# Patient Record
Sex: Female | Born: 1945 | Hispanic: No | Smoking: Never smoker
Health system: Southern US, Community
[De-identification: ages and names within clinical notes are randomized; demographics above are authoritative.]

## PROBLEM LIST (undated history)

## (undated) DIAGNOSIS — K5792 Diverticulitis of intestine, part unspecified, without perforation or abscess without bleeding: Secondary | ICD-10-CM

## (undated) DIAGNOSIS — E119 Type 2 diabetes mellitus without complications: Secondary | ICD-10-CM

## (undated) DIAGNOSIS — K219 Gastro-esophageal reflux disease without esophagitis: Secondary | ICD-10-CM

## (undated) DIAGNOSIS — I10 Essential (primary) hypertension: Secondary | ICD-10-CM

## (undated) DIAGNOSIS — E785 Hyperlipidemia, unspecified: Secondary | ICD-10-CM

## (undated) DIAGNOSIS — E079 Disorder of thyroid, unspecified: Secondary | ICD-10-CM

## (undated) DIAGNOSIS — J45909 Unspecified asthma, uncomplicated: Secondary | ICD-10-CM

## (undated) DIAGNOSIS — E7800 Pure hypercholesterolemia, unspecified: Secondary | ICD-10-CM

## (undated) DIAGNOSIS — F419 Anxiety disorder, unspecified: Secondary | ICD-10-CM

## (undated) DIAGNOSIS — E669 Obesity, unspecified: Secondary | ICD-10-CM

## (undated) DIAGNOSIS — L719 Rosacea, unspecified: Secondary | ICD-10-CM

## (undated) DIAGNOSIS — K5190 Ulcerative colitis, unspecified, without complications: Secondary | ICD-10-CM

## (undated) HISTORY — PX: ACHILLES TENDON REPAIR: SUR1153

## (undated) HISTORY — DX: Anxiety disorder, unspecified: F41.9

## (undated) HISTORY — PX: CHOLECYSTECTOMY: SHX55

## (undated) HISTORY — PX: APPENDECTOMY: SHX54

## (undated) HISTORY — DX: Diverticulitis of intestine, part unspecified, without perforation or abscess without bleeding: K57.92

## (undated) HISTORY — DX: Pure hypercholesterolemia, unspecified: E78.00

## (undated) HISTORY — DX: Obesity, unspecified: E66.9

## (undated) HISTORY — DX: Type 2 diabetes mellitus without complications: E11.9

## (undated) HISTORY — DX: Rosacea, unspecified: L71.9

## (undated) HISTORY — DX: Ulcerative colitis, unspecified, without complications: K51.90

## (undated) HISTORY — PX: OTHER SURGICAL HISTORY: SHX169

## (undated) HISTORY — DX: Essential (primary) hypertension: I10

---

## 1997-10-14 ENCOUNTER — Other Ambulatory Visit: Admission: RE | Admit: 1997-10-14 | Discharge: 1997-10-14 | Payer: Self-pay | Admitting: Family Medicine

## 1999-07-17 ENCOUNTER — Encounter: Admission: RE | Admit: 1999-07-17 | Discharge: 1999-10-15 | Payer: Self-pay | Admitting: *Deleted

## 2003-12-01 ENCOUNTER — Other Ambulatory Visit: Admission: RE | Admit: 2003-12-01 | Discharge: 2003-12-01 | Payer: Self-pay | Admitting: Family Medicine

## 2004-01-10 ENCOUNTER — Ambulatory Visit (HOSPITAL_COMMUNITY): Admission: RE | Admit: 2004-01-10 | Discharge: 2004-01-10 | Payer: Self-pay | Admitting: Gastroenterology

## 2005-01-26 ENCOUNTER — Other Ambulatory Visit: Admission: RE | Admit: 2005-01-26 | Discharge: 2005-01-26 | Payer: Self-pay | Admitting: Family Medicine

## 2005-04-21 ENCOUNTER — Emergency Department (HOSPITAL_COMMUNITY): Admission: EM | Admit: 2005-04-21 | Discharge: 2005-04-21 | Payer: Self-pay | Admitting: Emergency Medicine

## 2006-02-11 ENCOUNTER — Other Ambulatory Visit: Admission: RE | Admit: 2006-02-11 | Discharge: 2006-02-11 | Payer: Self-pay | Admitting: Family Medicine

## 2007-02-21 ENCOUNTER — Other Ambulatory Visit: Admission: RE | Admit: 2007-02-21 | Discharge: 2007-02-21 | Payer: Self-pay | Admitting: Family Medicine

## 2008-05-03 ENCOUNTER — Other Ambulatory Visit: Admission: RE | Admit: 2008-05-03 | Discharge: 2008-05-03 | Payer: Self-pay | Admitting: Family Medicine

## 2009-08-15 ENCOUNTER — Other Ambulatory Visit: Admission: RE | Admit: 2009-08-15 | Discharge: 2009-08-15 | Payer: Self-pay | Admitting: Family Medicine

## 2010-02-11 ENCOUNTER — Emergency Department (HOSPITAL_COMMUNITY): Admission: EM | Admit: 2010-02-11 | Discharge: 2010-02-12 | Payer: Self-pay | Admitting: Emergency Medicine

## 2010-04-12 ENCOUNTER — Encounter: Admission: RE | Admit: 2010-04-12 | Discharge: 2010-04-12 | Payer: Self-pay | Admitting: Family Medicine

## 2010-10-14 LAB — URINALYSIS, ROUTINE W REFLEX MICROSCOPIC
Bilirubin Urine: NEGATIVE
Glucose, UA: NEGATIVE mg/dL
Nitrite: NEGATIVE
Specific Gravity, Urine: 1.005 (ref 1.005–1.030)
pH: 7 (ref 5.0–8.0)

## 2010-10-14 LAB — CBC
HCT: 35.4 % — ABNORMAL LOW (ref 36.0–46.0)
MCH: 33 pg (ref 26.0–34.0)
MCV: 94.4 fL (ref 78.0–100.0)
RBC: 3.75 MIL/uL — ABNORMAL LOW (ref 3.87–5.11)
WBC: 9.5 10*3/uL (ref 4.0–10.5)

## 2010-10-14 LAB — URINE CULTURE
Colony Count: NO GROWTH
Culture: NO GROWTH

## 2010-10-14 LAB — DIFFERENTIAL
Basophils Absolute: 0 10*3/uL (ref 0.0–0.1)
Lymphocytes Relative: 7 % — ABNORMAL LOW (ref 12–46)
Monocytes Absolute: 0.7 10*3/uL (ref 0.1–1.0)
Neutro Abs: 7.9 10*3/uL — ABNORMAL HIGH (ref 1.7–7.7)

## 2010-10-14 LAB — URINE MICROSCOPIC-ADD ON

## 2010-10-14 LAB — GLUCOSE, CAPILLARY
Glucose-Capillary: 112 mg/dL — ABNORMAL HIGH (ref 70–99)
Glucose-Capillary: 159 mg/dL — ABNORMAL HIGH (ref 70–99)

## 2010-10-14 LAB — BASIC METABOLIC PANEL
BUN: 10 mg/dL (ref 6–23)
Chloride: 94 mEq/L — ABNORMAL LOW (ref 96–112)
GFR calc Af Amer: >60 mL/min (ref >60)
GFR calc non Af Amer: >60 mL/min (ref >60)

## 2010-10-19 ENCOUNTER — Other Ambulatory Visit (HOSPITAL_COMMUNITY)
Admission: RE | Admit: 2010-10-19 | Discharge: 2010-10-19 | Disposition: A | Discharge status: Home / Self Care | Payer: Medicare Other | Source: Ambulatory Visit | Attending: Family Medicine | Admitting: Family Medicine

## 2010-10-19 ENCOUNTER — Other Ambulatory Visit: Payer: Self-pay | Admitting: Family Medicine

## 2010-10-19 DIAGNOSIS — Z124 Encounter for screening for malignant neoplasm of cervix: Secondary | ICD-10-CM | POA: Insufficient documentation

## 2010-12-15 NOTE — Op Note (Signed)
NAME:  Kristina [name], Kristina [name]                           ACCOUNT NO.:  [id number]   MEDICAL RECORD NO.:  [id number]                   PATIENT TYPE:  AMB   LOCATION:  ENDO                                 FACILITY:  [facility]   PHYSICIAN:  John C. Madilyn Fireman, M.D.                 DATE OF BIRTH:  Jun 27, 1946   DATE OF PROCEDURE:  01/10/2004  DATE OF DISCHARGE:                                 OPERATIVE REPORT   PROCEDURE:  Colonoscopy.   INDICATION FOR PROCEDURE:  Colon cancer screening in a 65 year old patient  with a recent bout of documented diverticulitis.   PROCEDURE:  The patient was placed in the left lateral decubitus position  and placed on the pulse monitor with continuous low-flow oxygen delivered by  nasal cannula.  She was sedated with 50 mcg IV fentanyl and 6 mg IV Versed.  The Olympus video colonoscope was inserted into the rectum and advanced to  the cecum, confirmed by transillumination of McBurney's point and  visualization of the ileocecal valve and appendiceal orifice.  The prep was  excellent.  The cecum appeared normal with no masses, polyps, diverticula,  or other mucosal abnormalities.  Within the ascending, transverse,  descending, and sigmoid colon there were seen numerous diverticula, some  quite large, but no evidence of diverticulitis.  There were no masses,  polyps, or other abnormalities seen.  The rectum appeared normal, and  retroflexed view of the anus revealed no obvious internal hemorrhoids.  The  scope was then withdrawn and the patient returned to the recovery room in  stable condition.  She tolerated the procedure well, and there were no  immediate complications.   IMPRESSION:  Diverticulosis, otherwise normal study.   PLAN:  Next colon screening by sigmoidoscopy in five years.                                               John C. Madilyn Fireman, M.D.    JCH/MEDQ  D:  01/10/2004  T:  01/10/2004  Job:  [id number]   cc:   Duncan Dull, M.D.  [address]  [place]  [place] [address]  Fax: [phone]

## 2013-08-26 ENCOUNTER — Encounter: Payer: Self-pay | Admitting: Cardiovascular Disease

## 2013-08-26 ENCOUNTER — Encounter: Payer: Self-pay | Admitting: *Deleted

## 2013-08-26 ENCOUNTER — Telehealth: Payer: Self-pay | Admitting: Cardiovascular Disease

## 2013-08-26 ENCOUNTER — Ambulatory Visit (INDEPENDENT_AMBULATORY_CARE_PROVIDER_SITE_OTHER): Payer: Medicare Other | Admitting: Cardiovascular Disease

## 2013-08-26 VITALS — BP 140/90 | HR 74 | Ht 64.0 in | Wt 183.0 lb

## 2013-08-26 DIAGNOSIS — E119 Type 2 diabetes mellitus without complications: Secondary | ICD-10-CM | POA: Insufficient documentation

## 2013-08-26 DIAGNOSIS — I10 Essential (primary) hypertension: Secondary | ICD-10-CM

## 2013-08-26 DIAGNOSIS — F419 Anxiety disorder, unspecified: Secondary | ICD-10-CM | POA: Insufficient documentation

## 2013-08-26 DIAGNOSIS — I493 Ventricular premature depolarization: Secondary | ICD-10-CM | POA: Insufficient documentation

## 2013-08-26 DIAGNOSIS — I4949 Other premature depolarization: Secondary | ICD-10-CM

## 2013-08-26 DIAGNOSIS — R0600 Dyspnea, unspecified: Secondary | ICD-10-CM

## 2013-08-26 DIAGNOSIS — R0609 Other forms of dyspnea: Secondary | ICD-10-CM

## 2013-08-26 DIAGNOSIS — L719 Rosacea, unspecified: Secondary | ICD-10-CM | POA: Insufficient documentation

## 2013-08-26 DIAGNOSIS — R9431 Abnormal electrocardiogram [ECG] [EKG]: Secondary | ICD-10-CM

## 2013-08-26 DIAGNOSIS — R0989 Other specified symptoms and signs involving the circulatory and respiratory systems: Secondary | ICD-10-CM

## 2013-08-26 DIAGNOSIS — E669 Obesity, unspecified: Secondary | ICD-10-CM | POA: Insufficient documentation

## 2013-08-26 DIAGNOSIS — K5792 Diverticulitis of intestine, part unspecified, without perforation or abscess without bleeding: Secondary | ICD-10-CM | POA: Insufficient documentation

## 2013-08-26 NOTE — Progress Notes (Signed)
Patient ID: Kristina [name], female   DOB: 08/09/1945, 68 y.o.   MRN: [id number]  68 yo referred by primary for PVC and abnormal ECG.  She had routine exam on 1/19.  CRF;s HTN elevated lipids and DM.  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  She has some exertional dyspnea  Very infrequent palpitations at night.  Activity limited by knee pain.  Has lost about 10 lbs recently with better diet. No chest pain.  Reviewed ECG from 1/19 and had period of bigeminny with low lead placement mimicking anterior MI  ECG in our office today totally normal Denies ETOH, syncope excess caffeine or stimulants Compliant with meds  ROS: Denies fever, malais, weight loss, blurry vision, decreased visual acuity, cough, sputum, SOB, hemoptysis, pleuritic pain, palpitaitons, heartburn, abdominal pain, melena, lower extremity edema, claudication, or rash.  All other systems reviewed and negative   General: Affect appropriate Healthy:  appears stated age HEENT: normal Neck supple with no adenopathy JVP normal no bruits no thyromegaly Lungs clear with no wheezing and good diaphragmatic motion Heart:  S1/S2 no murmur,rub, gallop or click PMI normal Abdomen: benighn, BS positve, no tenderness, no AAA no bruit.  No HSM or HJR Distal pulses intact with no bruits No edema Neuro non-focal Skin warm and dry No muscular weakness  Medications Current Outpatient Prescriptions  Medication Sig Dispense Refill  . amLODipine (NORVASC) 2.5 MG tablet Take 2.5 mg by mouth daily.      Marland Kitchen. aspirin 81 MG tablet Take 81 mg by mouth daily.      . Calcium-Magnesium-Vitamin D (CALCIUM 500 PO) Take 1 tablet by mouth daily.      . hydrochlorothiazide (HYDRODIURIL) 25 MG tablet Take 25 mg by mouth daily.      . insulin glargine (LANTUS) 100 UNIT/ML injection Inject 18-20 Units into the skin at bedtime.      Marland Kitchen. lisinopril (PRINIVIL,ZESTRIL) 40 MG tablet Take 1 tablet by mouth  daily.      . metFORMIN (GLUCOPHAGE) 500 MG tablet 4 tabs po qd      . simvastatin (ZOCOR) 20 MG tablet Take 1 tablet by mouth daily.       No current facility-administered medications for this visit.    Allergies Ciprofloxacin and Doxycycline  Family History: No family history on file.  Social History: History   Social History  . Marital Status: Unknown    Spouse Name: N/A    Number of Children: N/A  . Years of Education: N/A   Occupational History  . Not on file.   Social History Main Topics  . Smoking status: Never Smoker   . Smokeless tobacco: Not on file  . Alcohol Use: Not on file  . Drug Use: Not on file  . Sexual Activity: Not on file   Other Topics Concern  . Not on file   Social History Narrative  . No narrative on file    Electrocardiogram:  1/19  SR rate 82 period of bigeminy poor R wave progression lead placement Today NSR rate 74 normal   Assessment and Plan

## 2013-08-26 NOTE — Assessment & Plan Note (Signed)
Well controlled.  Continue current medications and low sodium Dash type diet.    

## 2013-08-26 NOTE — Assessment & Plan Note (Signed)
Asymptomatic   Echo to assess RV and LV function ETT to r/o exercise induced worsening and r/o CAD

## 2013-08-26 NOTE — Telephone Encounter (Signed)
Walk In pt Form " New Pt Packet" dropped off gave to [employer]ine

## 2013-08-26 NOTE — Patient Instructions (Signed)
Your physician recommends that you schedule a follow-up appointment in:  AS NEEDED  Your physician recommends that you continue on your current medications as directed. Please refer to the Current Medication list given to you today. Your physician has requested that you have an echocardiogram. Echocardiography is a painless test that uses sound waves to create images of your heart. It provides your doctor with information about the size and shape of your heart and how well your heart's chambers and valves are working. This procedure takes approximately one hour. There are no restrictions for this procedure.   Your physician has requested that you have an exercise tolerance test. For further information please visit [email or web address]. Please also follow instruction sheet, as given.  

## 2013-08-26 NOTE — Assessment & Plan Note (Signed)
Discussed low carb diet.  Target hemoglobin A1c is 6.5 or less.  Continue current medications.  

## 2013-09-14 ENCOUNTER — Encounter: Payer: Self-pay | Admitting: Cardiovascular Disease

## 2013-09-30 ENCOUNTER — Ambulatory Visit (HOSPITAL_COMMUNITY): Payer: Medicare Other | Attending: Cardiovascular Disease | Admitting: Radiology

## 2013-09-30 ENCOUNTER — Telehealth: Payer: Self-pay | Admitting: Cardiovascular Disease

## 2013-09-30 ENCOUNTER — Ambulatory Visit (INDEPENDENT_AMBULATORY_CARE_PROVIDER_SITE_OTHER): Payer: Medicare Other | Admitting: Physician Assistant

## 2013-09-30 DIAGNOSIS — R0600 Dyspnea, unspecified: Secondary | ICD-10-CM

## 2013-09-30 DIAGNOSIS — R9431 Abnormal electrocardiogram [ECG] [EKG]: Secondary | ICD-10-CM

## 2013-09-30 NOTE — Progress Notes (Signed)
Exercise Treadmill Test  Pre-Exercise Testing Evaluation Rhythm: normal sinus  Rate: 91     Test  Exercise Tolerance Test Ordering MD: Charlton HawsPeter Nishan, MD  Interpreting MD: Tereso NewcomerScott Denijah Karrer, PA-C  Unique Test No: 1  Treadmill:  1  Indication for ETT: Abnormal EKG  Contraindication to ETT: No   Stress Modality: exercise - treadmill  Cardiac Imaging Performed: non   Protocol: standard Bruce - maximal  Max BP:  232/92  Max MPHR (bpm):  152 85% MPR (bpm):  129  MPHR obtained (bpm):  155 % MPHR obtained:  102  Reached 85% MPHR (min:sec):  1:15 Total Exercise Time (min-sec):  4:00  Workload in METS:  5.8 Borg Scale: 15  Reason ETT Terminated:  desired heart rate attained    ST Segment Analysis At Rest: non-specific ST segment slurring With Exercise: non-specific ST changes  Other Information Arrhythmia:  No Angina during ETT:  absent (0) Quality of ETT:  diagnostic  ETT Interpretation:  normal - no evidence of ischemia by ST analysis  Comments: Fair exercise capacity. No chest pain. Hypertensive  BP response to exercise. There was insignificant up-sloping ST depression at peak exercise.  No ST changes to suggest ischemia.  Occasional PVCs pre-test and in recovery. No exercise induced ventricular arrhythmias.  Recommendations: F/u with Dr. Charlton HawsPeter Nishan as directed. Signed,  Tereso NewcomerScott Raequan Vanschaick, PA-C   09/30/2013 10:46 AM

## 2013-09-30 NOTE — Telephone Encounter (Signed)
Walk In pt Form " EKG " [facility]

## 2013-09-30 NOTE — Progress Notes (Signed)
Echocardiogram Performed. 

## 2013-12-07 ENCOUNTER — Emergency Department (HOSPITAL_COMMUNITY): Payer: Medicare Other

## 2013-12-07 ENCOUNTER — Encounter (HOSPITAL_COMMUNITY): Payer: Self-pay | Admitting: Emergency Medicine

## 2013-12-07 ENCOUNTER — Emergency Department (HOSPITAL_COMMUNITY)
Admission: EM | Admit: 2013-12-07 | Discharge: 2013-12-07 | Disposition: A | Discharge status: Home / Self Care | Payer: Medicare Other | Attending: Emergency Medicine | Admitting: Emergency Medicine

## 2013-12-07 DIAGNOSIS — E7800 Pure hypercholesterolemia, unspecified: Secondary | ICD-10-CM | POA: Insufficient documentation

## 2013-12-07 DIAGNOSIS — E119 Type 2 diabetes mellitus without complications: Secondary | ICD-10-CM | POA: Insufficient documentation

## 2013-12-07 DIAGNOSIS — Z7982 Long term (current) use of aspirin: Secondary | ICD-10-CM | POA: Insufficient documentation

## 2013-12-07 DIAGNOSIS — I10 Essential (primary) hypertension: Secondary | ICD-10-CM | POA: Insufficient documentation

## 2013-12-07 DIAGNOSIS — Z794 Long term (current) use of insulin: Secondary | ICD-10-CM | POA: Insufficient documentation

## 2013-12-07 DIAGNOSIS — E669 Obesity, unspecified: Secondary | ICD-10-CM | POA: Insufficient documentation

## 2013-12-07 DIAGNOSIS — F411 Generalized anxiety disorder: Secondary | ICD-10-CM | POA: Insufficient documentation

## 2013-12-07 DIAGNOSIS — R55 Syncope and collapse: Secondary | ICD-10-CM

## 2013-12-07 DIAGNOSIS — Z79899 Other long term (current) drug therapy: Secondary | ICD-10-CM | POA: Insufficient documentation

## 2013-12-07 DIAGNOSIS — Z872 Personal history of diseases of the skin and subcutaneous tissue: Secondary | ICD-10-CM | POA: Insufficient documentation

## 2013-12-07 LAB — I-STAT TROPONIN, ED
TROPONIN I, POC: 0.01 ng/mL (ref 0.00–0.08)
Troponin i, poc: 0.01 ng/mL (ref 0.00–0.08)

## 2013-12-07 LAB — CBC WITH DIFFERENTIAL/PLATELET
BASOS ABS: 0 10*3/uL (ref 0.0–0.1)
Basophils Relative: 1 % (ref 0–1)
EOS ABS: 0.3 10*3/uL (ref 0.0–0.7)
EOS PCT: 4 % (ref 0–5)
HEMATOCRIT: 39.8 % (ref 36.0–46.0)
Hemoglobin: 13.7 g/dL (ref 12.0–15.0)
Lymphocytes Relative: 9 % — ABNORMAL LOW (ref 12–46)
Lymphs Abs: 0.7 10*3/uL (ref 0.7–4.0)
MCH: 31.6 pg (ref 26.0–34.0)
MCHC: 34.4 g/dL (ref 30.0–36.0)
MCV: 91.9 fL (ref 78.0–100.0)
MONOS PCT: 5 % (ref 3–12)
Monocytes Absolute: 0.4 10*3/uL (ref 0.1–1.0)
Neutro Abs: 5.9 10*3/uL (ref 1.7–7.7)
Neutrophils Relative %: 81 % — ABNORMAL HIGH (ref 43–77)
Platelets: 207 10*3/uL (ref 150–400)
RBC: 4.33 MIL/uL (ref 3.87–5.11)
RDW: 12.5 % (ref 11.5–15.5)
WBC: 7.1 10*3/uL (ref 4.0–10.5)

## 2013-12-07 LAB — BASIC METABOLIC PANEL
BUN: 16 mg/dL (ref 6–23)
CALCIUM: 9.7 mg/dL (ref 8.4–10.5)
CO2: 25 mEq/L (ref 19–32)
CREATININE: 0.82 mg/dL (ref 0.50–1.10)
Chloride: 99 mEq/L (ref 96–112)
GFR calc non Af Amer: 72 mL/min — ABNORMAL LOW (ref >90)
GFR, EST AFRICAN AMERICAN: 83 mL/min — AB (ref >90)
Glucose, Bld: 226 mg/dL — ABNORMAL HIGH (ref 70–99)
Potassium: 4.3 mEq/L (ref 3.7–5.3)
Sodium: 138 mEq/L (ref 137–147)

## 2013-12-07 MED ORDER — SODIUM CHLORIDE 0.9 % IV SOLN
Freq: Once | INTRAVENOUS | Status: AC
  Administered 2013-12-07: 12:00:00 via INTRAVENOUS

## 2013-12-07 NOTE — ED Notes (Signed)
Pt discharged home with all belongings, pt alert, oriented and ambulatory upon discharge, no new RX prescribed, pt verbalizes understanding of discharge instructions, pt driven home by friend

## 2013-12-07 NOTE — ED Provider Notes (Signed)
Medical screening examination/treatment/procedure(s) were conducted as a shared visit with non-physician practitioner(s) and myself.  I personally evaluated the patient during the encounter.  [facility] became lightheaded, sweaty, nauseated.  Felt better after sitting down.  No CP or SOB.  Diabetic, sugar was 180, ate normally today. Has done same exercise routine previously without problems.  Negative stress test March 2015. EKG unchanged. RRR, CTAB, nonfocal neuro exam.   EKG Interpretation   Date/Time:  Monday Dec 07 2013 11:01:51 EDT Ventricular Rate:  76 PR Interval:  147 QRS Duration: 79 QT Interval:  351 QTC Calculation: 395 R Axis:   68 Text Interpretation:  Sinus rhythm Ventricular premature complex No  previous ECGs available Confirmed by Anber Mckiver  MD, Elleigh Cassetta ([id number]) on  12/07/2013 11:23:55 AM      BP 107/62  Pulse 70  Temp(Src) 98.6 F (37 C) (Oral)  Resp 18  SpO2 99%    Glynn OctaveStephen Juwuan Sedita, MD 12/07/13 2021

## 2013-12-07 NOTE — ED Notes (Signed)
[facility] when she became sweating more than normal in this class and felt like she was going to pass out, nausea and fingers tingling which both of these have subsided. Pt assisted to floor and 911 called. Pt never loss consciousness or hit her head. 12 lead unremarkable, pt denies chest pain or discomfort, pt reported "feeling herself" en route to the ED. Orthostatic VS negative on scene, lying 150/90, sitting 190/100. VSS BP 137/52, HR 80, RR 16, 99% RA, CBG 182 20 G LH SL

## 2013-12-07 NOTE — ED Notes (Signed)
[facility], pt states while doing her routine exercise class this am she became hot and more sweaty than normal and felt like she was going to pass out. Pt reports associated symptoms of nausea and tingling to her finger tips that have both subsided. Pt also reports having "soft stool" x2 days, which pt states is also normal for her due to taking Metformin, pt denies loose, watery stool, or abd pain

## 2013-12-07 NOTE — Discharge Instructions (Signed)
Near-Syncope °Near-syncope (commonly known as near fainting) is sudden weakness, dizziness, or feeling like you might pass out. During an episode of near-syncope, you may also develop pale skin, have tunnel vision, or feel sick to your stomach (nauseous). Near-syncope may occur when getting up after sitting or while standing for a long time. It is caused by a sudden decrease in blood flow to the brain. This decrease can result from various causes or triggers, most of which are not serious. However, because near-syncope can sometimes be a sign of something serious, a medical evaluation is required. The specific cause is often not determined. °HOME CARE INSTRUCTIONS  °Monitor your condition for any changes. The following actions may help to alleviate any discomfort you are experiencing: °· Have someone stay with you until you feel stable. °· Lie down right away if you start feeling like you might faint. Breathe deeply and steadily. Wait until all the symptoms have passed. Most of these episodes last only a few minutes. You may feel tired for several hours.   °· Drink enough fluids to keep your urine clear or pale yellow.   °· If you are taking blood pressure or heart medicine, get up slowly when seated or lying down. Take several minutes to sit and then stand. This can reduce dizziness. °· Follow up with your health care provider as directed.  °SEEK IMMEDIATE MEDICAL CARE IF:  °· You have a severe headache.   °· You have unusual pain in the chest, abdomen, or back.   °· You are bleeding from the mouth or rectum, or you have black or tarry stool.   °· You have an irregular or very fast heartbeat.   °· You have repeated fainting or have seizure-like jerking during an episode.   °· You faint when sitting or lying down.   °· You have confusion.   °· You have difficulty walking.   °· You have severe weakness.   °· You have vision problems.   °MAKE SURE YOU:  °· Understand these instructions. °· Will watch your  condition. °· Will get help right away if you are not doing well or get worse. °Document Released: 07/16/2005 Document Revised: 03/18/2013 Document Reviewed: 12/19/2012 °ExitCare® Patient Information ©2014 [employer]. ° °

## 2013-12-07 NOTE — ED Provider Notes (Signed)
CSN: [id number]     Arrival date & time 12/07/13  1040 History   First MD Initiated Contact with Patient 12/07/13 1041     Chief Complaint  Patient presents with  . Near Syncope     (Consider location/radiation/quality/duration/timing/severity/associated sxs/prior Treatment) HPI Comments: Patient presents to the emergency department with chief complaint of near syncope. [facility] she became lightheaded, diaphoretic, and slightly nauseated. She states that she felt like she was going to pass out. She states that she was assisted to the ground. She did not ever pass out. She did not fall, or hit her head. She states that she is feeling much better now. She has a history of diabetes. She states that she has had this happen in the past. She states that she recently had a stress test and echocardiogram with the last 3 months because her primary care provider found PVCs on her EKG. she denies any chest pain, shortness of breath. She does state that she has had some loose stools intermittently for the past several weeks, but denies seeing any blood.  The history is provided by the patient. No language interpreter was used.    Past Medical History  Diagnosis Date  . Rosacea   . Hypercholesteremia   . Obesity   . HTN (hypertension)   . Diabetes   . Diverticulitis   . Anxiety    Past Surgical History  Procedure Laterality Date  . Cholecystectomy    . Appendectomy    . Cesarean section    . Achilles tendon repair     History reviewed. No pertinent family history. History  Substance Use Topics  . Smoking status: Never Smoker   . Smokeless tobacco: Not on file  . Alcohol Use: Not on file   OB History   Grav Para Term Preterm Abortions TAB SAB Ect Mult Living                 Review of Systems  Constitutional: Negative for fever and chills.  Respiratory: Negative for shortness of breath.   Cardiovascular: Negative for chest pain.   Gastrointestinal: Negative for nausea, vomiting, diarrhea and constipation.  Genitourinary: Negative for dysuria.      Allergies  Ciprofloxacin and Doxycycline  Home Medications   Prior to Admission medications   Medication Sig Start Date End Date Taking? Authorizing Provider  amLODipine (NORVASC) 2.5 MG tablet Take 2.5 mg by mouth daily.    Historical Provider, MD  aspirin 81 MG tablet Take 81 mg by mouth daily.    Historical Provider, MD  Calcium-Magnesium-Vitamin D (CALCIUM 500 PO) Take 1 tablet by mouth daily.    Historical Provider, MD  hydrochlorothiazide (HYDRODIURIL) 25 MG tablet Take 25 mg by mouth daily.    Historical Provider, MD  insulin glargine (LANTUS) 100 UNIT/ML injection Inject 18-20 Units into the skin at bedtime.    Historical Provider, MD  lisinopril (PRINIVIL,ZESTRIL) 40 MG tablet Take 1 tablet by mouth daily. 08/05/13   Historical Provider, MD  metFORMIN (GLUCOPHAGE) 500 MG tablet 4 tabs po qd    Historical Provider, MD  simvastatin (ZOCOR) 20 MG tablet Take 1 tablet by mouth daily. 08/05/13   Historical Provider, MD   There were no vitals taken for this visit. Physical Exam  Nursing note and vitals reviewed. Constitutional: She is oriented to person, place, and time. She appears well-developed and well-nourished.  HENT:  Head: Normocephalic and atraumatic.  Eyes: Conjunctivae and EOM  are normal. Pupils are equal, round, and reactive to light.  Neck: Normal range of motion. Neck supple.  Cardiovascular: Normal rate and regular rhythm.  Exam reveals no gallop and no friction rub.   No murmur heard. Pulmonary/Chest: Effort normal and breath sounds normal. No respiratory distress. She has no wheezes. She has no rales. She exhibits no tenderness.  Abdominal: Soft. She exhibits no distension and no mass. There is no tenderness. There is no rebound and no guarding.  Musculoskeletal: Normal range of motion. She exhibits no edema and no tenderness.  Neurological: She  is alert and oriented to person, place, and time.  Skin: Skin is warm and dry.  Psychiatric: She has a normal mood and affect. Her behavior is normal. Judgment and thought content normal.    ED Course  Procedures (including critical care time) Results for orders placed during the hospital encounter of 12/07/13  CBC WITH DIFFERENTIAL      Result Value Ref Range   WBC 7.1  4.0 - 10.5 K/uL   RBC 4.33  3.87 - 5.11 MIL/uL   Hemoglobin 13.7  12.0 - 15.0 g/dL   HCT 16.139.8  09.636.0 - 04.546.0 %   MCV 91.9  78.0 - 100.0 fL   MCH 31.6  26.0 - 34.0 pg   MCHC 34.4  30.0 - 36.0 g/dL   RDW 40.912.5  81.111.5 - 91.415.5 %   Platelets 207  150 - 400 K/uL   Neutrophils Relative % 81 (*) 43 - 77 %   Neutro Abs 5.9  1.7 - 7.7 K/uL   Lymphocytes Relative 9 (*) 12 - 46 %   Lymphs Abs 0.7  0.7 - 4.0 K/uL   Monocytes Relative 5  3 - 12 %   Monocytes Absolute 0.4  0.1 - 1.0 K/uL   Eosinophils Relative 4  0 - 5 %   Eosinophils Absolute 0.3  0.0 - 0.7 K/uL   Basophils Relative 1  0 - 1 %   Basophils Absolute 0.0  0.0 - 0.1 K/uL  BASIC METABOLIC PANEL      Result Value Ref Range   Sodium 138  137 - 147 mEq/L   Potassium 4.3  3.7 - 5.3 mEq/L   Chloride 99  96 - 112 mEq/L   CO2 25  19 - 32 mEq/L   Glucose, Bld 226 (*) 70 - 99 mg/dL   BUN 16  6 - 23 mg/dL   Creatinine, Ser 7.820.82  0.50 - 1.10 mg/dL   Calcium 9.7  8.4 - 95.610.5 mg/dL   GFR calc non Af Amer 72 (*) >90 mL/min   GFR calc Af Amer 83 (*) >90 mL/min  I-STAT TROPOININ, ED      Result Value Ref Range   Troponin i, poc 0.01  0.00 - 0.08 ng/mL   Comment 3           I-STAT TROPOININ, ED      Result Value Ref Range   Troponin i, poc 0.01  0.00 - 0.08 ng/mL   Comment 3            Dg Chest Portable 1 View  12/07/2013   CLINICAL DATA:  Near syncopal episode; history of hypertension and asthma  EXAM: PORTABLE CHEST - 1 VIEW  COMPARISON:  DG CHEST 2V dated 07/29/2013  FINDINGS: The lungs are adequately inflated. The interstitial markings are slightly more conspicuous  today than in the past. The cardiac silhouette is normal in size. The pulmonary vascularity is not  engorged. There is no pleural effusion or pneumothorax. There is no alveolar pneumonia. The mediastinum is normal in width. The observed portions of the bony thorax exhibit no acute abnormalities.  IMPRESSION: Mild prominence of the pulmonary interstitial markings may be related to the AP portable semi-erect technique. There is no focal pneumonia. Minimal interstitial edema is not absolutely excluded however.   Electronically Signed   By: David  Swazil[place]Jordan   On: 12/07/2013 13:35     Imaging Review No results found.   EKG Interpretation   Date/Time:  Monday Dec 07 2013 11:01:51 EDT Ventricular Rate:  76 PR Interval:  147 QRS Duration: 79 QT Interval:  351 QTC Calculation: 395 R Axis:   68 Text Interpretation:  Sinus rhythm Ventricular premature complex No  previous ECGs available Confirmed by Manus GunningANCOUR  MD, STEPHEN [phone]([id number]) on  12/07/2013 11:23:55 AM      MDM   Final diagnoses:  Near syncope    Patient with near syncope. No chest pain, no shortness of breath. [facility].  Low-risk for adverse outcome if discharged.  Labs are reassuring.  Patient is feeling well.  She is not in any apparent distress.  Patient seen by and discussed with Dr. Manus Gunningancour, who recommends calling the patient's cardiologist for telephone consult.    Patient has had a recent stress test and echo within the last 2 months which were both negative.  Discussed patient with Dr. Swazil[place]Jordan, who is on-call for Dr. Eden EmmsNishan, who recommends followup in the office later this week.  Roxy Horsemanobert Kristain Filo, PA-C 12/07/13 [phone]

## 2013-12-07 NOTE — ED Notes (Signed)
EDPA Browning at bedside. 

## 2013-12-08 ENCOUNTER — Ambulatory Visit (INDEPENDENT_AMBULATORY_CARE_PROVIDER_SITE_OTHER): Payer: Medicare Other | Admitting: Cardiovascular Disease

## 2013-12-08 ENCOUNTER — Encounter: Payer: Self-pay | Admitting: Cardiovascular Disease

## 2013-12-08 VITALS — BP 152/86 | HR 89 | Ht 64.0 in | Wt 175.1 lb

## 2013-12-08 DIAGNOSIS — E119 Type 2 diabetes mellitus without complications: Secondary | ICD-10-CM

## 2013-12-08 DIAGNOSIS — I4949 Other premature depolarization: Secondary | ICD-10-CM

## 2013-12-08 DIAGNOSIS — R55 Syncope and collapse: Secondary | ICD-10-CM

## 2013-12-08 DIAGNOSIS — I493 Ventricular premature depolarization: Secondary | ICD-10-CM

## 2013-12-08 DIAGNOSIS — I10 Essential (primary) hypertension: Secondary | ICD-10-CM

## 2013-12-08 NOTE — Progress Notes (Signed)
Patient ID: Kristina [name], female   DOB: 12/05/1945, 68 y.o.   MRN: [id number] 68 yo referred by primary for PVC and abnormal ECG. She had routine exam on 1/19. CRF;s HTN elevated lipids and DM. 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 She has some exertional dyspnea Very infrequent palpitations at night. Activity limited by knee pain. Has lost about 10 lbs recently with better diet. No chest pain. Reviewed ECG from 1/19 and had period of bigeminny with low lead placement mimicking anterior MI ECG in our office today totally normal  Denies ETOH, syncope excess caffeine or stimulants Compliant with meds  F/U echo  10/01/13 Study Conclusions  - Left ventricle: The cavity size was normal. There was mild concentric hypertrophy. Systolic function was normal. The estimated ejection fraction was in the range of 55% to 60%. Wall motion was normal; there were no regional wall motion abnormalities. Doppler parameters are consistent with abnormal left ventricular relaxation (grade 1 diastolic dysfunction). The E/e' ratio is >10, suggesting elevated LV filling pressure. - Left atrium: The atrium was normal in size. - Inferior vena cava: The vessel was normal in size; the respirophasic diameter changes were in the normal range (= 50%); findings are consistent with normal central venous pressure. - Pericardium, extracardiac: There was no pericardial effusion.  ETT at same time negative for ischemia HTN response and no increase in ectopy  Seen in ER yesterday for presyncopal episode  Sounded vasovagal after coming out of bathroom  No chest pain palpitations or dyspnea.  Telemetry was fine and  BS was not low as has happened before     ROS: Denies fever, malais, weight loss, blurry vision, decreased visual acuity, cough, sputum, SOB, hemoptysis, pleuritic pain, palpitaitons, heartburn, abdominal pain, melena, lower extremity edema,  claudication, or rash.  All other systems reviewed and negative  General: Affect appropriate Healthy:  appears stated age HEENT: normal Neck supple with no adenopathy JVP normal no bruits no thyromegaly Lungs clear with no wheezing and good diaphragmatic motion Heart:  S1/S2 SEM murmur, no rub, gallop or click PMI normal Abdomen: benighn, BS positve, no tenderness, no AAA no bruit.  No HSM or HJR Distal pulses intact with no bruits No edema Neuro non-focal Skin warm and dry No muscular weakness   Current Outpatient Prescriptions  Medication Sig Dispense Refill  . amLODipine (NORVASC) 2.5 MG tablet Take 2.5 mg by mouth daily.      Marland Kitchen. aspirin 81 MG tablet Take 81 mg by mouth daily.      . Calcium-Magnesium-Vitamin D (CALCIUM 500 PO) Take 1 tablet by mouth daily.      . hydrochlorothiazide (HYDRODIURIL) 25 MG tablet Take 25 mg by mouth daily.      . insulin glargine (LANTUS) 100 UNIT/ML injection Inject 13-14 Units into the skin at bedtime.       Marland Kitchen. lisinopril (PRINIVIL,ZESTRIL) 40 MG tablet Take 1 tablet by mouth daily.      . meloxicam (MOBIC) 15 MG tablet Take 15 mg by mouth daily.      . metFORMIN (GLUCOPHAGE) 500 MG tablet Take 2,000 mg by mouth every evening. 4 tabs po qd      . simvastatin (ZOCOR) 20 MG tablet Take 1 tablet by mouth daily.       No current facility-administered medications for this visit.    Allergies  Ciprofloxacin and Doxycycline  Electrocardiogram: SR isolated PVC normal ST/T waves  Assessment and Plan

## 2013-12-08 NOTE — Assessment & Plan Note (Signed)
Well controlled.  Continue current medications and low sodium Dash type diet.    

## 2013-12-08 NOTE — Patient Instructions (Signed)
Your physician recommends that you continue on your current medications as directed. Please refer to the Current Medication list given to you today.  Your physician recommends that you schedule a follow-up appointment in: 3 months with Dr.Nishan  

## 2013-12-08 NOTE — Assessment & Plan Note (Signed)
Normal EF by echo and negative ETT observe for now

## 2013-12-08 NOTE — Assessment & Plan Note (Signed)
Discussed low carb diet.  Target hemoglobin A1c is 6.5 or less.  Continue current medications.  

## 2013-12-08 NOTE — Assessment & Plan Note (Signed)
?   Vagal episode No obvious cardiac etiology May be candidate for loop recorder if she has a recurrent episode only because she does have  Some ambient PVC;s

## 2014-02-03 ENCOUNTER — Other Ambulatory Visit: Payer: Self-pay | Admitting: Family Medicine

## 2014-02-03 ENCOUNTER — Ambulatory Visit
Admission: RE | Admit: 2014-02-03 | Discharge: 2014-02-03 | Disposition: A | Discharge status: Home / Self Care | Payer: 59 | Source: Ambulatory Visit | Attending: Family Medicine | Admitting: Family Medicine

## 2014-02-03 DIAGNOSIS — R0781 Pleurodynia: Secondary | ICD-10-CM

## 2014-03-11 ENCOUNTER — Ambulatory Visit (INDEPENDENT_AMBULATORY_CARE_PROVIDER_SITE_OTHER): Payer: Medicare Other | Admitting: Cardiovascular Disease

## 2014-03-11 ENCOUNTER — Encounter: Payer: Self-pay | Admitting: Cardiovascular Disease

## 2014-03-11 VITALS — BP 148/76 | HR 80 | Ht 64.0 in | Wt 172.0 lb

## 2014-03-11 DIAGNOSIS — I493 Ventricular premature depolarization: Secondary | ICD-10-CM

## 2014-03-11 DIAGNOSIS — I4949 Other premature depolarization: Secondary | ICD-10-CM

## 2014-03-11 DIAGNOSIS — F419 Anxiety disorder, unspecified: Secondary | ICD-10-CM

## 2014-03-11 DIAGNOSIS — F411 Generalized anxiety disorder: Secondary | ICD-10-CM

## 2014-03-11 DIAGNOSIS — I10 Essential (primary) hypertension: Secondary | ICD-10-CM

## 2014-03-11 MED ORDER — AMLODIPINE BESYLATE 5 MG PO TABS: 5.0000 mg | ORAL_TABLET | Freq: Every day | ORAL | Status: DC

## 2014-03-11 NOTE — Assessment & Plan Note (Signed)
Increase norvasc to 5 mg  F/u primary

## 2014-03-11 NOTE — Progress Notes (Signed)
Patient ID: Kristina [name], female   DOB: 08-26-1945, 68 y.o.   MRN: [id number] 68 yo referred by primary for PVC and abnormal ECG. She had routine exam on 1/19. CRF;s HTN elevated lipids and DM. DM is labile recently made worse by prednisone She denies history of CAD Son had MI age 50 with stents. She has some exertional dyspnea Very infrequent palpitations at night. Activity limited by knee pain. Has lost about 10 lbs recently with better diet. No chest pain. Reviewed ECG from 1/19 and had period of bigeminny with low lead placement mimicking anterior MI ECG in our office today totally normal  Denies ETOH, syncope excess caffeine or stimulants Compliant with meds  F/U echo 10/01/13  Study Conclusions  - Left ventricle: The cavity size was normal. There was mild concentric hypertrophy. Systolic function was normal. The estimated ejection fraction was in the range of 55% to 60%. Wall motion was normal; there were no regional wall motion abnormalities. Doppler parameters are consistent with abnormal left ventricular relaxation (grade 1 diastolic dysfunction). The E/e' ratio is >10, suggesting elevated LV filling pressure. - Left atrium: The atrium was normal in size. - Inferior vena cava: The vessel was normal in size; the respirophasic diameter changes were in the normal range (= 50%); findings are consistent with normal central venous pressure. - Pericardium, extracardiac: There was no pericardial effusion.  ETT at same time negative for ischemia HTN response and no increase in ectopy   Seen in ER May 2015  for presyncopal episode Sounded vasovagal after coming out of bathroom No chest pain palpitations or dyspnea. Telemetry was fine and  BS was not low as has happened before   Some irratic somatic complaints Left rib pain and back pain  Needs liver / lipid checked with Primary     ROS: Denies fever, malais, weight loss, blurry vision, decreased visual acuity, cough, sputum, SOB,  hemoptysis, pleuritic pain, palpitaitons, heartburn, abdominal pain, melena, lower extremity edema, claudication, or rash.  All other systems reviewed and negative  General: Affect appropriate Healthy:  appears stated age HEENT: normal Neck supple with no adenopathy JVP normal no bruits no thyromegaly Lungs clear with no wheezing and good diaphragmatic motion Heart:  S1/S2 no murmur, no rub, gallop or click PMI normal Abdomen: benighn, BS positve, no tenderness, no AAA no bruit.  No HSM or HJR Distal pulses intact with no bruits No edema Neuro non-focal Skin warm and dry No muscular weakness  Bilateral hammer toes    Current Outpatient Prescriptions  Medication Sig Dispense Refill  . amLODipine (NORVASC) 2.5 MG tablet Take 2.5 mg by mouth daily.      Marland Kitchen aspirin 81 MG tablet Take 81 mg by mouth daily.      . Calcium-Magnesium-Vitamin D (CALCIUM 500 PO) Take 1 tablet by mouth daily.      Marland Kitchen glucose blood test strip       . hydrochlorothiazide (HYDRODIURIL) 25 MG tablet Take 25 mg by mouth daily.      Marland Kitchen ibuprofen (ADVIL,MOTRIN) 200 MG tablet Take 200 mg by mouth every 6 (six) hours as needed.      . insulin glargine (LANTUS) 100 UNIT/ML injection Inject 13-14 Units into the skin at bedtime.       Marland Kitchen lisinopril (PRINIVIL,ZESTRIL) 40 MG tablet Take 1 tablet by mouth daily.      . meloxicam (MOBIC) 15 MG tablet Take 15 mg by mouth daily.      . metFORMIN (GLUCOPHAGE) 500 MG tablet  Take 2,000 mg by mouth every evening. 4 tabs po qd      . metroNIDAZOLE (METROCREAM) 0.75 % cream Apply topically 2 (two) times daily.      . simvastatin (ZOCOR) 20 MG tablet Take 1 tablet by mouth daily.       No current facility-administered medications for this visit.    Allergies  Ciprofloxacin and Doxycycline  Electrocardiogram:   SR normal PVC   Assessment and Plan

## 2014-03-11 NOTE — Patient Instructions (Addendum)
**Note De-Identified  Obfuscation** Your physician has recommended you make the following change in your medication: increase Norvasc to 5 mg daily. Please take your Norvasc and Hydrochlorothiazide in the AM and take Lisinopril in the PM.  Your physician wants you to follow-up in: 1 year. You will receive a reminder letter in the mail two months in advance. If you don't receive a letter, please call our office to schedule the follow-up appointment.

## 2014-03-11 NOTE — Assessment & Plan Note (Signed)
Improved but still seems overly concerned about minor medical issues

## 2014-03-11 NOTE — Assessment & Plan Note (Signed)
Resolved asymptomatic  No CAD and normal EF Continue current meds

## 2014-03-11 NOTE — Assessment & Plan Note (Signed)
Discussed low carb diet.  Target hemoglobin A1c is 6.5 or less.  Continue current medications.  

## 2014-08-19 ENCOUNTER — Other Ambulatory Visit: Payer: Self-pay | Admitting: Family Medicine

## 2014-08-19 ENCOUNTER — Ambulatory Visit
Admission: RE | Admit: 2014-08-19 | Discharge: 2014-08-19 | Disposition: A | Discharge status: Home / Self Care | Payer: PRIVATE HEALTH INSURANCE | Source: Ambulatory Visit | Attending: Family Medicine | Admitting: Family Medicine

## 2014-08-19 DIAGNOSIS — R059 Cough, unspecified: Secondary | ICD-10-CM

## 2014-08-19 DIAGNOSIS — R05 Cough: Secondary | ICD-10-CM

## 2014-08-27 ENCOUNTER — Other Ambulatory Visit: Payer: Self-pay | Admitting: Family Medicine

## 2014-08-27 ENCOUNTER — Ambulatory Visit
Admission: RE | Admit: 2014-08-27 | Discharge: 2014-08-27 | Disposition: A | Discharge status: Home / Self Care | Payer: PRIVATE HEALTH INSURANCE | Source: Ambulatory Visit | Attending: Family Medicine | Admitting: Family Medicine

## 2014-08-27 DIAGNOSIS — R059 Cough, unspecified: Secondary | ICD-10-CM

## 2014-08-27 DIAGNOSIS — R05 Cough: Secondary | ICD-10-CM

## 2014-09-14 ENCOUNTER — Encounter: Payer: Self-pay | Admitting: Cardiovascular Disease

## 2014-09-14 NOTE — Progress Notes (Signed)
Patient ID: Kristina [name], female   DOB: 03/06/1946, 69 y.o.   MRN: [id number]   69 y.o.  referred by primary for PVC and abnormal ECG. She had routine exam on 1/19. CRF;s HTN elevated lipids and DM. DM is labile recently made worse by prednisone She denies history of CAD Son had MI age 73 with stents. She has some exertional dyspnea Very infrequent palpitations at night. Activity limited by knee pain. Has lost about 10 lbs recently with better diet. No chest pain. Reviewed ECG from 1/19 and had period of bigeminny with low lead placement mimicking anterior MI ECG in our office today totally normal  Denies ETOH, syncope excess caffeine or stimulants Compliant with meds  F/U echo 10/01/13  Study Conclusions  - Left ventricle: The cavity size was normal. There was mild concentric hypertrophy. Systolic function was normal. The estimated ejection fraction was in the range of 55% to 60%. Wall motion was normal; there were no regional wall motion abnormalities. Doppler parameters are consistent with abnormal left ventricular relaxation (grade 1 diastolic dysfunction). The E/e' ratio is >10, suggesting elevated LV filling pressure. - Left atrium: The atrium was normal in size. - Inferior vena cava: The vessel was normal in size; the respirophasic diameter changes were in the normal range (= 50%); findings are consistent with normal central venous pressure. - Pericardium, extracardiac: There was no pericardial effusion.  ETT at same time negative for ischemia HTN response and no increase in ectopy   Seen in ER May 2015  for presyncopal episode Sounded vasovagal after coming out of bathroom No chest pain palpitations or dyspnea. Telemetry was fine and  BS was not low as has happened before   Some irratic somatic complaints Left rib pain and back pain  Needs liver / lipid checked with Primary   This [place] has had a prolonged URI  Rx with two antibiotics and prednisone  Atypical chest pain and  labile BP EMS saw her yesterday  ECG normal   No fever sputum  CXR 1/29  Normal no infiltrate or cardiomegaly     ROS: Denies fever, malais, weight loss, blurry vision, decreased visual acuity, cough, sputum, SOB, hemoptysis, pleuritic pain, palpitaitons, heartburn, abdominal pain, melena, lower extremity edema, claudication, or rash.  All other systems reviewed and negative  General: Affect appropriate Healthy:  appears stated age HEENT: normal Neck supple with no adenopathy JVP normal no bruits no thyromegaly Lungs clear with no wheezing and good diaphragmatic motion Heart:  S1/S2 no murmur, no rub, gallop or click PMI normal Abdomen: benighn, BS positve, no tenderness, no AAA no bruit.  No HSM or HJR Distal pulses intact with no bruits No edema Neuro non-focal Skin warm and dry No muscular weakness  Bilateral hammer toes    Current Outpatient Prescriptions  Medication Sig Dispense Refill  . amLODipine (NORVASC) 5 MG tablet Take 1 tablet (5 mg total) by mouth daily. 30 tablet 11  . aspirin 81 MG tablet Take 81 mg by mouth daily.    . Calcium-Magnesium-Vitamin D (CALCIUM 500 PO) Take 1 tablet by mouth daily.    Marland Kitchen glucose blood test strip     . hydrochlorothiazide (HYDRODIURIL) 25 MG tablet Take 25 mg by mouth daily.    Marland Kitchen ibuprofen (ADVIL,MOTRIN) 200 MG tablet Take 200 mg by mouth every 6 (six) hours as needed.    . insulin glargine (LANTUS) 100 UNIT/ML injection Inject 13-14 Units into the skin at bedtime.     Marland Kitchen lisinopril (PRINIVIL,ZESTRIL)  40 MG tablet Take 1 tablet by mouth daily.    . meloxicam (MOBIC) 15 MG tablet Take 15 mg by mouth daily.    . metFORMIN (GLUCOPHAGE) 500 MG tablet Take 2,000 mg by mouth every evening. 4 tabs po qd    . metroNIDAZOLE (METROCREAM) 0.75 % cream Apply topically 2 (two) times daily.    . simvastatin (ZOCOR) 20 MG tablet Take 1 tablet by mouth daily.     No current facility-administered medications for this visit.     Allergies  Ciprofloxacin and Doxycycline  Electrocardiogram:   SR normal PVC  8/15  2/16  SR rate 80 normal   Assessment and Plan

## 2014-09-15 ENCOUNTER — Encounter: Payer: Self-pay | Admitting: Cardiovascular Disease

## 2014-09-15 ENCOUNTER — Ambulatory Visit (INDEPENDENT_AMBULATORY_CARE_PROVIDER_SITE_OTHER): Payer: Medicare Other | Admitting: Cardiovascular Disease

## 2014-09-15 VITALS — BP 160/78 | HR 102 | Ht 64.0 in | Wt 174.8 lb

## 2014-09-15 DIAGNOSIS — R079 Chest pain, unspecified: Secondary | ICD-10-CM | POA: Insufficient documentation

## 2014-09-15 DIAGNOSIS — R072 Precordial pain: Secondary | ICD-10-CM

## 2014-09-15 DIAGNOSIS — E1121 Type 2 diabetes mellitus with diabetic nephropathy: Secondary | ICD-10-CM

## 2014-09-15 DIAGNOSIS — I10 Essential (primary) hypertension: Secondary | ICD-10-CM

## 2014-09-15 DIAGNOSIS — I493 Ventricular premature depolarization: Secondary | ICD-10-CM

## 2014-09-15 NOTE — Assessment & Plan Note (Signed)
Discussed low carb diet.  Target hemoglobin A1c is 6.5 or less.  Continue current medications. Recent elevated BS from prednisone

## 2014-09-15 NOTE — Assessment & Plan Note (Signed)
Seems related to URI  CXR ok  Given relative tachycardia and dyspnea will check echo to r/o DCM or pericardial effusion ECG also reassuring

## 2014-09-15 NOTE — Patient Instructions (Signed)
Your physician recommends that you schedule a follow-up appointment in:  NEXT  [facility]  Your physician recommends that you continue on your current medications as directed. Please refer to the Current Medication list given to you today.   Your physician has requested that you have an echocardiogram. Echocardiography is a painless test that uses sound waves to create images of your heart. It provides your doctor with information about the size and shape of your heart and how well your heart's chambers and valves are working. This procedure takes approximately one hour. There are no restrictions for this procedure.

## 2014-09-15 NOTE — Assessment & Plan Note (Signed)
Resolved previously normal EF and normal ETT

## 2014-09-15 NOTE — Assessment & Plan Note (Signed)
Labile from URI and prednisone  F/u home readings and 6 weeks consider changing to beta blocker or increasing amlodipine

## 2014-09-16 ENCOUNTER — Ambulatory Visit (HOSPITAL_COMMUNITY): Payer: Medicare Other | Attending: Cardiology

## 2014-09-16 DIAGNOSIS — I10 Essential (primary) hypertension: Secondary | ICD-10-CM | POA: Diagnosis not present

## 2014-09-16 DIAGNOSIS — R079 Chest pain, unspecified: Secondary | ICD-10-CM | POA: Diagnosis present

## 2014-09-16 DIAGNOSIS — E669 Obesity, unspecified: Secondary | ICD-10-CM | POA: Insufficient documentation

## 2014-09-16 DIAGNOSIS — E119 Type 2 diabetes mellitus without complications: Secondary | ICD-10-CM | POA: Insufficient documentation

## 2014-09-16 NOTE — Progress Notes (Signed)
2D Echo completed. 09/16/2014 

## 2014-09-20 ENCOUNTER — Telehealth: Payer: Self-pay | Admitting: Cardiovascular Disease

## 2014-09-20 NOTE — Telephone Encounter (Signed)
Her echo is normal sent note to nurse already.  URI f/u primary

## 2014-09-20 NOTE — Telephone Encounter (Signed)
New message ° ° ° °Patient calling for echo test results °

## 2014-09-20 NOTE — Telephone Encounter (Signed)
Contacted the Kristina [name] to inform her that per Dr Eden EmmsNishan her echo was normal and she should follow-up with her PCP.  Kristina [name] verbalized understanding and agrees with this plan.

## 2014-09-20 NOTE — Telephone Encounter (Signed)
Preliminary results of her echo have been reviewed with the pt.  Pt is aware this is a preliminary result and will be further reviewed by her primary cardiologist. Pt states she is still experiencing the effects of bronchitis and her family members are as well experiencing some kind of URI. Pt wanted Dr Eden EmmsNishan to be aware of this.  Pt states her cough is still very productive with yellow phlegm noted in the morning.   Informed the pt that I will forward this message to Dr Eden EmmsNishan and covering nurse for further review and follow-up with the pt. Pt verbalized understanding and agrees with this plan.

## 2014-12-23 ENCOUNTER — Institutional Professional Consult (permissible substitution): Payer: Medicare Other | Admitting: Internal Medicine

## 2015-01-07 ENCOUNTER — Ambulatory Visit (INDEPENDENT_AMBULATORY_CARE_PROVIDER_SITE_OTHER): Payer: Medicare Other | Admitting: Internal Medicine

## 2015-01-07 ENCOUNTER — Encounter: Payer: Self-pay | Admitting: Internal Medicine

## 2015-01-07 ENCOUNTER — Ambulatory Visit (INDEPENDENT_AMBULATORY_CARE_PROVIDER_SITE_OTHER)
Admission: RE | Admit: 2015-01-07 | Discharge: 2015-01-07 | Disposition: A | Discharge status: Home / Self Care | Payer: Medicare Other | Source: Ambulatory Visit | Attending: Internal Medicine | Admitting: Internal Medicine

## 2015-01-07 VITALS — BP 158/98 | HR 101 | Ht 64.0 in | Wt 169.0 lb

## 2015-01-07 DIAGNOSIS — R058 Other specified cough: Secondary | ICD-10-CM

## 2015-01-07 DIAGNOSIS — I10 Essential (primary) hypertension: Secondary | ICD-10-CM | POA: Diagnosis not present

## 2015-01-07 DIAGNOSIS — R05 Cough: Secondary | ICD-10-CM | POA: Diagnosis not present

## 2015-01-07 MED ORDER — NEBIVOLOL HCL 10 MG PO TABS: 10.0000 mg | ORAL_TABLET | Freq: Every day | ORAL | Status: DC

## 2015-01-07 MED ORDER — PREDNISONE 10 MG PO TABS: ORAL_TABLET | ORAL | Status: DC

## 2015-01-07 NOTE — Progress Notes (Signed)
Subjective:    Patient ID: Kristina [name], female    DOB: 1945/11/20,   MRN: [id number]  HPI  2  yowf never smoked never resp problems new onset cough assoc with abrupt uri while on acei "the worst cough ever" referred to pulmonary clinic 01/07/2015  by Dr Kevan Ny p d/c acei x May 2016 and still coughing.   01/07/2015 1st [facility] office visit/ Wert   Chief Complaint  Patient presents with  . Pulmonary Consult    Referred by Dr. Shaune Pollack. Pt c/o cough for the past 6 months.  Cough is prod with minimal yellow to clear sputum. She has noticed cough can be triggered by laughing, bending over, and with exertion.   acute onset cough Jan 2016  with fever / dry cough / fatigue / unable to sing due to hoarseness rx with cough syrup/zpak and fever resolved but cough never did ever  since and acei stopped early May 2016  No better. Cough is more day than night and never wakes her up in am or even bothers her for the first few hours of the day but is then present for the rest of the day every day  No obvious other patterns in day to day or daytime variabilty or assoc sob cp or chest tightness, subjective wheeze overt sinus or hb symptoms. No unusual exp hx or h/o childhood pna/ asthma or knowledge of premature birth.  Sleeping ok without nocturnal  or early am exacerbation  of respiratory  c/o's or need for noct saba. Also denies any obvious fluctuation of symptoms with weather or environmental changes or other aggravating or alleviating factors except as outlined above   Current Medications, Allergies, Complete Past Medical History, Past Surgical History, Family History, and Social History were reviewed in [employer] record.            Review of Systems  Constitutional: Negative for fever, chills and unexpected weight change.  HENT: Negative for congestion, dental problem, ear discharge, ear pain, nosebleeds, postnasal drip, rhinorrhea, sinus pressure, sneezing,  sore throat, trouble swallowing and voice change.   Eyes: Negative for visual disturbance.  Respiratory: Positive for cough. Negative for choking and shortness of breath.   Cardiovascular: Negative for chest pain and leg swelling.  Gastrointestinal: Negative for vomiting, abdominal pain and diarrhea.  Genitourinary: Negative for difficulty urinating.  Musculoskeletal: Positive for arthralgias.  Skin: Negative for rash.  Neurological: Negative for tremors, syncope and headaches.  Hematological: Does not bruise/bleed easily.       Objective:   Physical Exam   amb wf with cough urge when exhales   Wt Readings from Last 3 Encounters:  01/07/15 169 lb (76.658 kg)  09/15/14 174 lb 12.8 oz (79.289 kg)  03/11/14 172 lb (78.019 kg)    Vital signs reviewed   HEENT: nl dentition, turbinates, and orophanx. Nl external ear canals without cough reflex   NECK :  without JVD/Nodes/TM/ nl carotid upstrokes bilaterally   LUNGS: no acc muscle use, insp and exp rhonchi mostly upper airway worse with FVC   CV:  RRR  no s3 or murmur or increase in P2, no edema   ABD:  soft and nontender with nl excursion in the supine position. No bruits or organomegaly, bowel sounds nl  MS:  warm without deformities, calf tenderness, cyanosis or clubbing  SKIN: warm and dry without lesions    NEURO:  alert, approp, no deficits     CXR PA and Lateral:  01/07/2015 :     I personally reviewed images and agree with radiology impression as follows:   There is no active cardiopulmonary disease.          Assessment & Plan:

## 2015-01-07 NOTE — Progress Notes (Signed)
Quick Note:  Spoke with pt and notified of results per Dr. Wert. Pt verbalized understanding and denied any questions.  ______ 

## 2015-01-07 NOTE — Assessment & Plan Note (Signed)
The most common causes of chronic cough in immunocompetent adults include the following: upper airway cough syndrome (UACS), previously referred to as postnasal drip syndrome (PNDS), which is caused by variety of rhinosinus conditions; (2) asthma; (3) GERD; (4) chronic bronchitis from cigarette smoking or other inhaled environmental irritants; (5) nonasthmatic eosinophilic bronchitis; and (6) bronchiectasis.   These conditions, singly or in combination, have accounted for up to 94% of the causes of chronic cough in prospective studies.   Other conditions have constituted no >6% of the causes in prospective studies These have included bronchogenic carcinoma, chronic interstitial pneumonia, sarcoidosis, left ventricular failure, ACEI-induced cough, and aspiration from a condition associated with pharyngeal dysfunction.    Chronic cough is often simultaneously caused by more than one condition. A single cause has been found from 38 to 82% of the time, multiple causes from 18 to 62%. Multiply caused cough has been the result of three diseases up to 42% of the time.       Based on hx and exam, this is most likely:  Classic Upper airway cough syndrome, so named because it's frequently impossible to sort out how much is  CR/sinusitis with freq throat clearing (which can be related to primary GERD)   vs  causing  secondary (" extra esophageal")  GERD from wide swings in gastric pressure that occur with throat clearing, often  promoting self use of mint and menthol lozenges that reduce the lower esophageal sphincter tone and exacerbate the problem further in a cyclical fashion.   These are the same pts (now being labeled as having "irritable larynx syndrome" by some cough centers) who not infrequently have a history of having failed to tolerate ace inhibitors and now some arbs,esp cozar, see hbp) dry powder inhalers or biphosphonates or report having atypical reflux symptoms that don't respond to standard doses  of PPI , and are easily confused as having aecopd or asthma flares by even experienced allergists/ pulmonologists.   The first step is to maximize acid suppression and eliminate arbs then regroup if the cough persists.  See instructions for specific recommendations which were reviewed directly with the patient who was given a copy with highlighter outlining the key components.

## 2015-01-07 NOTE — Assessment & Plan Note (Signed)
ACE inhibitors are problematic in  pts with airway complaints because  even experienced pulmonologists can't always distinguish ace effects from copd/asthma/pnds/ allergies etc.  By themselves they don't actually cause a problem, much like oxygen can't by itself start a fire, but they certainly serve as a powerful catalyst or enhancer for any "fire"  or inflammatory process in the upper airway, be it caused by an ET  tube or more commonly reflux (especially in the obese or pts with known GERD or who are on biphoshonates) or URI's, due to interference with bradykinin clearance.  The effects of acei on bradykinin levels occurs in 100% of pt's on acei (unless they surreptitiously stop the med!) but the classic cough is only reported in 5%.  This leaves 95% of pts on acei's  with a variety of syndromes including no identifiable symptom in most  vs non-specific symptoms that wax and wane depending on what other insult is occuring at the level of the upper airway in this case probably originally viral, then reflux from coughing.    Note she now near the end of the 6 week trial on cozar.  Unfortunately, For reasons that may related to vascular permability and nitric oxide pathways but not elevated  bradykinin levels (as seen with  ACEi use) losartan in the generic form has been reported now from mulitple sources  to cause a similar pattern of non-specific  upper airway symptoms as seen with acei.   This has not been reported with exposure to the other ARB's to date, so it seems reasonable for now to try either generic diovan or avapro if ARB needed or use an alternative class altogether.  See:  Dewayne Hatch Allergy Asthma Immunol  2008: 101: p [phone]    Try  Bystolic, the most beta -1  selective Beta blocker available in sample form, with bisoprolol the most selective generic choice  on the market.   Samples of 10 mg bystolic given, f/u in 2 weeks

## 2015-01-07 NOTE — Patient Instructions (Addendum)
Stop cozar and start bystolic 10mg  daily x 2 weeks   Prednisone 10 mg take  4 each am x 2 days,   2 each am x 2 days,  1 each am x 2 days and stop   Increase prilosec to 40 mg  Take  30-60 min before first meal of the day and  Add over the counter Pepcid (famotidine)  20 mg one @  bedtime until return to office - this is the best way to tell whether stomach acid is contributing to your problem.    GERD (REFLUX)  is an extremely common cause of respiratory symptoms just like yours , many times with no obvious heartburn at all.    It can be treated with medication, but also with lifestyle changes including avoidance of late meals, elevation of the head of your bed (ideally with 6 inch  bed blocks) excessive alcohol, smoking cessation, and avoid fatty foods, chocolate, peppermint, colas, red wine, and acidic juices such as orange juice.  NO MINT OR MENTHOL PRODUCTS SO NO COUGH DROPS  USE SUGARLESS CANDY INSTEAD (Jolley ranchers or Stover's or Life Savers) or even ice chips will also do - the key is to swallow to prevent all throat clearing. NO OIL BASED VITAMINS - use powdered substitutes.  Please schedule a follow up office visit in 2 weeks, sooner if needed to see [name] to recheck bp and start process Add if not better rec trial of singulair/ allergy profile/ sinus ct then f/u with me for ? MCT

## 2015-01-21 ENCOUNTER — Encounter: Payer: Self-pay | Admitting: Adult Health

## 2015-01-21 ENCOUNTER — Ambulatory Visit (INDEPENDENT_AMBULATORY_CARE_PROVIDER_SITE_OTHER): Payer: Medicare Other | Admitting: Adult Health

## 2015-01-21 ENCOUNTER — Other Ambulatory Visit (INDEPENDENT_AMBULATORY_CARE_PROVIDER_SITE_OTHER): Payer: Medicare Other

## 2015-01-21 VITALS — BP 122/82 | HR 66 | Temp 98.6°F | Ht 64.0 in | Wt 175.0 lb

## 2015-01-21 DIAGNOSIS — I10 Essential (primary) hypertension: Secondary | ICD-10-CM

## 2015-01-21 DIAGNOSIS — R05 Cough: Secondary | ICD-10-CM | POA: Diagnosis not present

## 2015-01-21 DIAGNOSIS — R058 Other specified cough: Secondary | ICD-10-CM

## 2015-01-21 LAB — CBC WITH DIFFERENTIAL/PLATELET
Basophils Absolute: 0 10*3/uL (ref 0.0–0.1)
Basophils Relative: 0.6 % (ref 0.0–3.0)
EOS ABS: 0.3 10*3/uL (ref 0.0–0.7)
Eosinophils Relative: 3.7 % (ref 0.0–5.0)
HCT: 40.6 % (ref 36.0–46.0)
Hemoglobin: 13.9 g/dL (ref 12.0–15.0)
Lymphocytes Relative: 19.2 % (ref 12.0–46.0)
Lymphs Abs: 1.4 10*3/uL (ref 0.7–4.0)
MCHC: 34.4 g/dL (ref 30.0–36.0)
MCV: 94.7 fl (ref 78.0–100.0)
MONO ABS: 0.4 10*3/uL (ref 0.1–1.0)
Monocytes Relative: 5.5 % (ref 3.0–12.0)
NEUTROS PCT: 71 % (ref 43.0–77.0)
Neutro Abs: 5.3 10*3/uL (ref 1.4–7.7)
Platelets: 238 10*3/uL (ref 150.0–400.0)
RBC: 4.28 Mil/uL (ref 3.87–5.11)
RDW: 13 % (ref 11.5–15.5)
WBC: 7.4 10*3/uL (ref 4.0–10.5)

## 2015-01-21 MED ORDER — NEBIVOLOL HCL 10 MG PO TABS: 10.0000 mg | ORAL_TABLET | Freq: Every day | ORAL | Status: DC

## 2015-01-21 NOTE — Progress Notes (Signed)
Chart and office note reviewed in detail  > agree with a/p as outlined    

## 2015-01-21 NOTE — Progress Notes (Signed)
Subjective:    Patient ID: Kristina [name], female    DOB: [birth year],   MRN: [id number]  HPI  28  yowf never smoked never resp problems new onset cough assoc with abrupt uri while on acei "the worst cough ever" referred to pulmonary clinic 01/07/2015  by Dr Kevan Ny p d/c acei x May 2016 and still coughing.   01/07/2015 1st [facility] office visit/ Wert   Chief Complaint  Patient presents with  . Pulmonary Consult    Referred by Dr. Shaune Pollack. Pt c/o cough for the past 6 months.  Cough is prod with minimal yellow to clear sputum. She has noticed cough can be triggered by laughing, bending over, and with exertion.   acute onset cough Jan 2016  with fever / dry cough / fatigue / unable to sing due to hoarseness rx with cough syrup/zpak and fever resolved but cough never did ever  since and acei stopped early May 2016  No better. Cough is more day than night and never wakes her up in am or even bothers her for the first few hours of the day but is then present for the rest of the day every day  01/21/2015 Follow up  Patient returns for a two-week follow-up He was seen last visit for pulmonary consult for cough that has been present since January 2016. Last visit. Patient was changed from Cozaar to Bystolic. Blood pressure has been well controlled. She was also given a prednisone taper, which she says has helped her cough better since she finished her prednisone. Her cough is starting to slowly return. She does have postnasal drainage. She also has intermittent yellow mucus. Chest x-ray last visit showed clear lungs. Patient is a never smoker She denies any hemoptysis, orthopnea, PND or leg swelling. Is not currently using any medications for cough or drainage.     Current Medications, Allergies, Complete Past Medical History, Past Surgical History, Family History, and Social History were reviewed in [employer] record.            Review of Systems    Constitutional: Negative for fever, chills and unexpected weight change.  HENT: Negative for congestion, dental problem, ear discharge, ear pain, nosebleeds, postnasal drip, rhinorrhea, sinus pressure, sneezing, sore throat, trouble swallowing and voice change.   Eyes: Negative for visual disturbance.  Respiratory: Positive for cough. Negative for choking and shortness of breath.   Cardiovascular: Negative for chest pain and leg swelling.  Gastrointestinal: Negative for vomiting, abdominal pain and diarrhea.  Genitourinary: Negative for difficulty urinating.   Skin: Negative for rash.  Neurological: Negative for tremors, syncope and headaches.  Hematological: Does not bruise/bleed easily.       Objective:   Physical Exam   amb wf  Vital signs reviewed   HEENT: nl dentition, turbinates, and orophanx. Nl external ear canals without cough reflex   NECK :  without JVD/Nodes/TM/ nl carotid upstrokes bilaterally   LUNGS: no acc muscle use, faint exp wheezing  And psuedo-wheezing .   CV:  RRR  no s3 or murmur or increase in P2, no edema   ABD:  soft and nontender with nl excursion in the supine position. No bruits or organomegaly, bowel sounds nl  MS:  warm without deformities, calf tenderness, cyanosis or clubbing  SKIN: warm and dry without lesions    NEURO:  alert, approp, no deficits     CXR PA and Lateral:   01/07/2015 :     There  is no active cardiopulmonary disease.          Assessment & Plan:

## 2015-01-21 NOTE — Assessment & Plan Note (Signed)
Controlled on current regimen Follow up primary for further blood pressure management

## 2015-01-21 NOTE — Assessment & Plan Note (Signed)
Persistent cough ? Etiology  Return for PFT  Check ct sinus  Cough control   Plan  Continue on Bystolic 10mg  daily .  Continue on Omeprazole 20mg  daily in am before meal  Continue on Pepcid 20mg  At bedtime   Check CT sinus .  Labs today , CBC with diff, RAST /IgE.  Mucinex Twice daily  As needed  Cough/congestion  Delsym 2 tsp Twice daily  As needed  Cough .  Begin Zyrtec 10mg  At bedtime  .

## 2015-01-21 NOTE — Patient Instructions (Addendum)
Continue on Bystolic 10mg  daily .  Continue on Omeprazole 20mg  daily in am before meal  Continue on Pepcid 20mg  At bedtime   Check CT sinus .  Labs today , CBC with diff, RAST /IgE.  Mucinex Twice daily  As needed  Cough/congestion  Delsym 2 tsp Twice daily  As needed  Cough .  Begin Zyrtec 10mg  At bedtime  .       GERD (REFLUX)  is an extremely common cause of respiratory symptoms just like yours , many times with no obvious heartburn at all.    It can be treated with medication, but also with lifestyle changes including avoidance of late meals, elevation of the head of your bed (ideally with 6 inch  bed blocks) excessive alcohol, smoking cessation, and avoid fatty foods, chocolate, peppermint, colas, red wine, and acidic juices such as orange juice.  NO MINT OR MENTHOL PRODUCTS SO NO COUGH DROPS  USE SUGARLESS CANDY INSTEAD (Jolley ranchers or Stover's or Life Savers) or even ice chips will also do - the key is to swallow to prevent all throat clearing. NO OIL BASED VITAMINS - use powdered substitutes.  Follow up Dr. Sherene Sires  In 3-4 weeks with PFT   Please contact office for sooner follow up if symptoms do not improve or worsen or seek emergency care

## 2015-01-24 LAB — ALLERGY FULL PROFILE
Allergen,Goose feathers, e70: <0.1 kU/L
Bahia Grass: <0.1 kU/L
Box Elder IgE: <0.1 kU/L
Common Ragweed: <0.1 kU/L
Curvularia lunata: <0.1 kU/L
D. farinae: <0.1 kU/L
Dog Dander: <0.1 kU/L
Elm IgE: <0.1 kU/L
Fescue: <0.1 kU/L
G005 Rye, Perennial: <0.1 kU/L
G009 Red Top: <0.1 kU/L
Goldenrod: <0.1 kU/L
Helminthosporium halodes: <0.1 kU/L
House Dust Hollister: <0.1 kU/L
IgE (Immunoglobulin E), Serum: 55 kU/L (ref <115)
Stemphylium Botryosum: <0.1 kU/L
Timothy Grass: <0.1 kU/L

## 2015-01-26 ENCOUNTER — Telehealth: Payer: Self-pay | Admitting: Adult Health

## 2015-01-26 ENCOUNTER — Ambulatory Visit (INDEPENDENT_AMBULATORY_CARE_PROVIDER_SITE_OTHER)
Admission: RE | Admit: 2015-01-26 | Discharge: 2015-01-26 | Disposition: A | Discharge status: Home / Self Care | Payer: Medicare Other | Source: Ambulatory Visit | Attending: Adult Health | Admitting: Adult Health

## 2015-01-26 DIAGNOSIS — R058 Other specified cough: Secondary | ICD-10-CM

## 2015-01-26 DIAGNOSIS — R05 Cough: Secondary | ICD-10-CM | POA: Diagnosis not present

## 2015-01-26 NOTE — Telephone Encounter (Signed)
Sinus ct nl so needs f/u here if not better to her satisfaction p the July 4th weekend but bring all active meds

## 2015-01-26 NOTE — Telephone Encounter (Signed)
lmomtcb x1 

## 2015-01-26 NOTE — Telephone Encounter (Signed)
MW pt. Saw TP and she ordered CT sinus. TP on vacation this week. CT sinus done today (in epic). Please advise MW regarding results. thanks

## 2015-01-27 NOTE — Telephone Encounter (Signed)
Routing back to triage to ensure follow-up: per below, we are not to call pt between 10-12.

## 2015-01-27 NOTE — Telephone Encounter (Signed)
Pt calling back [phone]

## 2015-01-27 NOTE — Telephone Encounter (Signed)
I spoke with patient about results and she verbalized understanding.

## 2015-01-28 ENCOUNTER — Telehealth: Payer: Self-pay | Admitting: Internal Medicine

## 2015-01-28 NOTE — Telephone Encounter (Signed)
Spoke with pt. States that after taking Omeprazole in the AM she is having diarrhea. She is taking the medication about 45 minutes before her first meal of the day. Medication has been taken for the past 4 days with diarrhea. Would like MW's recommendations.  MW - please advise. Thanks.

## 2015-01-28 NOTE — Telephone Encounter (Signed)
Spoke with pt . Advised her of MW's recommendation. She will call us back if she continues to have issues. Nothing further was needed.

## 2015-01-28 NOTE — Telephone Encounter (Signed)
Change to pepcid 20 mg after bfast and supper and get about the same coverage

## 2015-02-01 NOTE — Progress Notes (Signed)
Quick Note:  Called and spoke with pt. Reviewed results and recs. Pt voiced understanding and had no further question. ______ 

## 2015-02-11 ENCOUNTER — Telehealth: Payer: Self-pay | Admitting: Internal Medicine

## 2015-02-11 MED ORDER — TRAMADOL HCL 50 MG PO TABS: 50.0000 mg | ORAL_TABLET | ORAL | Status: DC | PRN

## 2015-02-11 NOTE — Telephone Encounter (Signed)
Best option is tramadol 50 mg every 4 hours prn to supplement the delsym not replace it  but be sure has f/u ov with me with all meds in hand  #40 no refills/warn may make her sleepy

## 2015-02-11 NOTE — Telephone Encounter (Signed)
Spoke with pt and is are of recs. [facility] She will call back and make appt if this does not help. Will sign off message

## 2015-02-11 NOTE — Telephone Encounter (Signed)
lmomtcb x1 on VM 

## 2015-02-11 NOTE — Telephone Encounter (Signed)
Pt returned call - [phone]

## 2015-02-11 NOTE — Telephone Encounter (Signed)
Spoke with pt. States that she would like some cough syrup prescribed. Reports coughing with production of yellow mucus and wheezing. Denies chest tightness, SOB or fever. Has tried Delsym OTC with no relief.  MW - please advise. Thanks.

## 2015-02-15 ENCOUNTER — Other Ambulatory Visit: Payer: Self-pay | Admitting: Internal Medicine

## 2015-02-15 DIAGNOSIS — R0600 Dyspnea, unspecified: Secondary | ICD-10-CM

## 2015-02-16 ENCOUNTER — Ambulatory Visit (INDEPENDENT_AMBULATORY_CARE_PROVIDER_SITE_OTHER): Payer: Medicare Other | Admitting: Internal Medicine

## 2015-02-16 ENCOUNTER — Ambulatory Visit (INDEPENDENT_AMBULATORY_CARE_PROVIDER_SITE_OTHER): Payer: Medicare Other | Admitting: Adult Health

## 2015-02-16 ENCOUNTER — Emergency Department (HOSPITAL_COMMUNITY)
Admission: EM | Admit: 2015-02-16 | Discharge: 2015-02-16 | Disposition: A | Discharge status: Home / Self Care | Payer: Medicare Other | Attending: Emergency Medicine | Admitting: Emergency Medicine

## 2015-02-16 ENCOUNTER — Encounter (HOSPITAL_COMMUNITY): Payer: Self-pay

## 2015-02-16 ENCOUNTER — Ambulatory Visit: Payer: Medicare Other | Admitting: Internal Medicine

## 2015-02-16 VITALS — BP 170/84 | HR 80

## 2015-02-16 DIAGNOSIS — Z79899 Other long term (current) drug therapy: Secondary | ICD-10-CM | POA: Insufficient documentation

## 2015-02-16 DIAGNOSIS — R0602 Shortness of breath: Secondary | ICD-10-CM | POA: Insufficient documentation

## 2015-02-16 DIAGNOSIS — R064 Hyperventilation: Secondary | ICD-10-CM | POA: Diagnosis not present

## 2015-02-16 DIAGNOSIS — Z7982 Long term (current) use of aspirin: Secondary | ICD-10-CM | POA: Insufficient documentation

## 2015-02-16 DIAGNOSIS — E780 Pure hypercholesterolemia: Secondary | ICD-10-CM | POA: Diagnosis not present

## 2015-02-16 DIAGNOSIS — Z794 Long term (current) use of insulin: Secondary | ICD-10-CM | POA: Diagnosis not present

## 2015-02-16 DIAGNOSIS — R0600 Dyspnea, unspecified: Secondary | ICD-10-CM | POA: Diagnosis not present

## 2015-02-16 DIAGNOSIS — Z872 Personal history of diseases of the skin and subcutaneous tissue: Secondary | ICD-10-CM | POA: Diagnosis not present

## 2015-02-16 DIAGNOSIS — R200 Anesthesia of skin: Secondary | ICD-10-CM | POA: Diagnosis not present

## 2015-02-16 DIAGNOSIS — F419 Anxiety disorder, unspecified: Secondary | ICD-10-CM | POA: Insufficient documentation

## 2015-02-16 DIAGNOSIS — Z8719 Personal history of other diseases of the digestive system: Secondary | ICD-10-CM | POA: Insufficient documentation

## 2015-02-16 DIAGNOSIS — E119 Type 2 diabetes mellitus without complications: Secondary | ICD-10-CM | POA: Diagnosis not present

## 2015-02-16 DIAGNOSIS — I10 Essential (primary) hypertension: Secondary | ICD-10-CM | POA: Diagnosis not present

## 2015-02-16 DIAGNOSIS — R55 Syncope and collapse: Secondary | ICD-10-CM

## 2015-02-16 DIAGNOSIS — E669 Obesity, unspecified: Secondary | ICD-10-CM | POA: Diagnosis not present

## 2015-02-16 LAB — PULMONARY FUNCTION TEST
DL/VA % pred: 101 %
DL/VA: 4.93 ml/min/mmHg/L
DLCO UNC % PRED: 97 %
DLCO unc: 24.26 ml/min/mmHg
FEF 25-75 POST: 2.9 L/s
FEF 25-75 Pre: 1.86 L/sec
FEF2575-%Change-Post: 56 %
FEF2575-%PRED-PRE: 96 %
FEF2575-%Pred-Post: 150 %
FEV1-%Change-Post: 13 %
FEV1-%PRED-PRE: 98 %
FEV1-%Pred-Post: 111 %
FEV1-Post: 2.59 L
FEV1-Pre: 2.28 L
FEV1FVC-%Change-Post: 9 %
FEV1FVC-%Pred-Pre: 99 %
FEV6-%Change-Post: 2 %
FEV6-%PRED-PRE: 102 %
FEV6-%Pred-Post: 104 %
FEV6-PRE: 3 L
FEV6-Post: 3.06 L
FEV6FVC-%CHANGE-POST: 0 %
FEV6FVC-%PRED-POST: 105 %
FEV6FVC-%PRED-PRE: 104 %
FVC-%Change-Post: 3 %
FVC-%PRED-PRE: 98 %
FVC-%Pred-Post: 102 %
FVC-POST: 3.13 L
FVC-PRE: 3.02 L
POST FEV6/FVC RATIO: 100 %
Post FEV1/FVC ratio: 83 %
Pre FEV1/FVC ratio: 76 %
Pre FEV6/FVC Ratio: 99 %
RV % pred: 79 %
RV: 1.76 L
TLC % PRED: 98 %
TLC: 5.04 L

## 2015-02-16 LAB — CBC
HCT: 38.2 % (ref 36.0–46.0)
Hemoglobin: 13.9 g/dL (ref 12.0–15.0)
MCH: 33 pg (ref 26.0–34.0)
MCHC: 36.4 g/dL — ABNORMAL HIGH (ref 30.0–36.0)
MCV: 90.7 fL (ref 78.0–100.0)
Platelets: 236 10*3/uL (ref 150–400)
RBC: 4.21 MIL/uL (ref 3.87–5.11)
RDW: 12.5 % (ref 11.5–15.5)
WBC: 8.2 10*3/uL (ref 4.0–10.5)

## 2015-02-16 LAB — BASIC METABOLIC PANEL
Anion gap: 9 (ref 5–15)
BUN: 10 mg/dL (ref 6–20)
CALCIUM: 10 mg/dL (ref 8.9–10.3)
CO2: 28 mmol/L (ref 22–32)
Chloride: 101 mmol/L (ref 101–111)
Creatinine, Ser: 0.83 mg/dL (ref 0.44–1.00)
GFR calc Af Amer: >60 mL/min (ref >60)
GFR calc non Af Amer: >60 mL/min (ref >60)
Glucose, Bld: 130 mg/dL — ABNORMAL HIGH (ref 65–99)
POTASSIUM: 4 mmol/L (ref 3.5–5.1)
Sodium: 138 mmol/L (ref 135–145)

## 2015-02-16 MED ORDER — VECURONIUM BROMIDE 10 MG IV SOLR: 10.0000 mg | Freq: Once | INTRAVENOUS | Status: DC

## 2015-02-16 NOTE — Assessment & Plan Note (Signed)
Near syncope after PFT with associated chest pressure, speech changes  Pt supported in office with monitoring , O2 and IVF until EMS arrival  HR -80, O2 sats 100%, BS 158 , BP 170 . Pt alert and stable on EMS arrival  [facility]

## 2015-02-16 NOTE — ED Notes (Signed)
Per EMS, Patient was at the pulmonologist getting a PFT. When patient was walking out of the building after the test, patient started to not feel herself. She stated she started to have SOB. Patient reports resting for about five minutes. Patient reports having trouble writing and thinking. Pt reports tightness in chest, tingling in the left arm, and the sensation she was going to pass out. During transport, patient reports cessation of symptoms. Denies any symptoms upon arrival. Patient has Hx of HTN, DM, Productive cough for six months without relief. Vitals per EMS: 147/72, 70 HR, 98%, 18 RR, 158 CBG.

## 2015-02-16 NOTE — Progress Notes (Signed)
Subjective:    Patient ID: Ardath SaxLorraine T Dino, female    DOB: 05/18/1946,   MRN: [id number]  HPI  [age]  yowf never smoked never resp problems new onset cough assoc with abrupt uri while on acei "the worst cough ever" referred to pulmonary clinic 01/07/2015  by Dr Kevan NyGates p d/c acei x May 2016 and still coughing.   01/07/2015 1st [facility] office visit/ Wert   Chief Complaint  Patient presents with  . Pulmonary Consult    Referred by Dr. Shaune Pollackonna Gates. Pt c/o cough for the past 6 months.  Cough is prod with minimal yellow to clear sputum. She has noticed cough can be triggered by laughing, bending over, and with exertion.   acute onset cough Jan 2016  with fever / dry cough / fatigue / unable to sing due to hoarseness rx with cough syrup/zpak and fever resolved but cough never did ever  since and acei stopped early May 2016  No better. Cough is more day than night and never wakes her up in am or even bothers her for the first few hours of the day but is then present for the rest of the day every day  01/21/2015 Follow up  Patient returns for a two-week follow-up He was seen last visit for pulmonary consult for cough that has been present since January 2016. Last visit. Patient was changed from Cozaar to Bystolic. Blood pressure has been well controlled. She was also given a prednisone taper, which she says has helped her cough better since she finished her prednisone. Her cough is starting to slowly return. She does have postnasal drainage. She also has intermittent yellow mucus. Chest x-ray last visit showed clear lungs. Patient is a never smoker She denies any hemoptysis, orthopnea, PND or leg swelling. Is not currently using any medications for cough or drainage. >>CT sinus   02/16/2015 Acute OV  Pt presented to office for an acute work in visit.  She had PFT this am that was normal with no airflow obstruction/restriction , +BD response Normal DLCO .  Right after PFT she became very  weak, near syncope .  EMS was called for emergency transport.  She was brought back to exam room.  Complained of chest pressure , tightness, and arm tingling.  Speech became irregular and slightly slurred.  BP ~170. HR 80. Sats 100% on RA.  Blood sugar 158.  She was placed on 2l/m [place] O2 . IV was placed with NS IVF.  Placed on continuous heart monitor.  Dr. Sherene SiresWert  Aware and in room with pt exam.  Pt did improve with normal speech on arrival of EMS.  She remained alert to person and surroundings.  EMS report given and [facility].    Current Medications, Allergies, Complete Past Medical History, Past Surgical History, Family History, and Social History were reviewed in [facility] electronic medical record.   Review of Systems  Constitutional: Negative for fever, chills and unexpected weight change.  HENT: Negative for congestion, dental problem, ear discharge, ear pain, nosebleeds, postnasal drip, rhinorrhea, sinus pressure, sneezing, sore throat, trouble swallowing and voice change.   Eyes: Negative for visual disturbance.  Respiratory: Positive for cough. + shortness of breath.   Cardiovascular: + chest pain  Gastrointestinal: Negative for vomiting, abdominal pain and diarrhea.  Genitourinary: Negative for difficulty urinating.   Skin: Negative for rash.  Neurological: Negative for tremors, syncope and headaches.  Hematological: Does not bruise/bleed easily.  Objective:   Physical Exam   amb wf  Vital signs reviewed   HEENT: nl dentition, turbinates, and orophanx. Nl external ear canals without cough reflex   NECK :  without JVD/Nodes/TM/ nl carotid upstrokes bilaterally   LUNGS: no acc muscle use, CTA w/ no wheezing   CV:  RRR  no s3 or murmur or increase in P2, no edema   ABD:  soft and nontender with nl excursion in the supine position. No bruits or organomegaly, bowel sounds nl  MS:  warm without deformities, calf tenderness, cyanosis or  clubbing  SKIN: warm and dry without lesions    NEURO:  Alert x 3 . Slurred speech initially , resolved after 5-10 min       CXR PA and Lateral:   01/07/2015 :     There is no active cardiopulmonary disease.          Assessment & Plan:

## 2015-02-16 NOTE — Progress Notes (Signed)
Chart and office note reviewed in detail / pt interviewed and examined in w/c> agree with a/p as outlined

## 2015-02-16 NOTE — Progress Notes (Signed)
PFT done today. 

## 2015-02-16 NOTE — ED Notes (Signed)
Patient reports cough for six months and a chest tightness that has been intermittent with the cough. Pt denies any nausea, vomiting, SOB, Radiation of pain, or numbness and tingling at this time. Patient denies any new symptoms to the pain.

## 2015-02-16 NOTE — ED Provider Notes (Signed)
CSN: [id number]     Arrival date & time 02/16/15  1159 History   First MD Initiated Contact with Patient 02/16/15 1201     Chief Complaint  Patient presents with  . Near Syncope      HPI  Patient presents for evaluation after episode of dizziness. She has a history of chronic cough. She was seen by primary care, GI, and recently with pulmonary. Was at Dr.Wert's office today who underwent point function testing. She states she had a "really hard time" finishing her PFTs. States she was short of breath began breathing hard and fast. She states her mouth became dry and her fingers became numb to her mid forearms. She walked out of the office. She wanted some water to she got an elevator went downstairs where she noted there was a water fountain. She started feeling worse. She back on the elevator went back up to the pulmonary office. She spoke with the front desk and was assessed by nurse practitioner. Referred here because she continued to complain of tingling in her fingers and lightheadedness.  Symptoms resolved. She is asymptomatic upon her arrival here. Her cough has been present for over a year. Was on an ACE inhibitor, this was discontinued. Placed on a RB and this was discontinued. Seen by GI and has had evaluation and treatment for reflux without improvement. Has had negative x-rays. PFTs done today results pending.   Past Medical History  Diagnosis Date  . Rosacea   . Hypercholesteremia   . Obesity   . HTN (hypertension)   . Diabetes   . Diverticulitis   . Anxiety    Past Surgical History  Procedure Laterality Date  . Cholecystectomy    . Appendectomy    . Cesarean section    . Achilles tendon repair     Family History  Problem Relation Age of Onset  . Hypertension Mother   . Heart failure Mother   . Hypotension Father   . Dementia Father    History  Substance Use Topics  . Smoking status: Never Smoker   . Smokeless tobacco: Never Used  . Alcohol Use: No   OB  History    No data available     Review of Systems  Constitutional: Negative for fever, chills, diaphoresis, appetite change and fatigue.  HENT: Negative for mouth sores, sore throat and trouble swallowing.   Eyes: Negative for visual disturbance.  Respiratory: Positive for shortness of breath. Negative for cough, chest tightness and wheezing.   Cardiovascular: Negative for chest pain.  Gastrointestinal: Negative for nausea, vomiting, abdominal pain, diarrhea and abdominal distention.  Endocrine: Negative for polydipsia, polyphagia and polyuria.  Genitourinary: Negative for dysuria, frequency and hematuria.  Musculoskeletal: Negative for gait problem.  Skin: Negative for color change, pallor and rash.  Neurological: Positive for numbness. Negative for dizziness, syncope, light-headedness and headaches.  Hematological: Does not bruise/bleed easily.  Psychiatric/Behavioral: Negative for behavioral problems and confusion.      Allergies  Ciprofloxacin; Doxycycline; and Shrimp  Home Medications   Prior to Admission medications   Medication Sig Start Date End Date Taking? Authorizing Provider  acetaminophen (TYLENOL) 500 MG tablet Take 500 mg by mouth every 6 (six) hours as needed.    Historical Provider, MD  amLODipine (NORVASC) 5 MG tablet Take 1 tablet (5 mg total) by mouth daily. 03/11/14   [name], MD  aspirin 81 MG tablet Take 81 mg by mouth daily.    Historical Provider, MD  Calcium-Magnesium-Vitamin D (  CALCIUM 500 PO) Take 1 tablet by mouth daily.    Historical Provider, MD  hydrochlorothiazide (HYDRODIURIL) 25 MG tablet Take 25 mg by mouth daily.    Historical Provider, MD  insulin glargine (LANTUS) 100 UNIT/ML injection Inject 13-14 Units into the skin at bedtime.     Historical Provider, MD  metFORMIN (GLUCOPHAGE) 500 MG tablet Take 2,000 mg by mouth every evening. 4 tabs po qd    Historical Provider, MD  metroNIDAZOLE (METROCREAM) 0.75 % cream Apply topically 2 (two)  times daily.    Historical Provider, MD  nebivolol (BYSTOLIC) 10 MG tablet Take 1 tablet (10 mg total) by mouth daily. 01/21/15   [name] S Parrett, NP  simvastatin (ZOCOR) 20 MG tablet Take 1 tablet by mouth daily. 08/05/13   Historical Provider, MD  traMADol (ULTRAM) 50 MG tablet Take 1 tablet (50 mg total) by mouth every 4 (four) hours as needed. 02/11/15   Nyoka CowdenMichael B Wert, MD   BP 186/74 mmHg  Pulse 77  Temp(Src) 98.5 F (36.9 C) (Oral)  Resp 18  SpO2 100% Physical Exam  Constitutional: She is oriented to person, place, and time. She appears well-developed and well-nourished. No distress.  HENT:  Head: Normocephalic.  Eyes: Conjunctivae are normal. Pupils are equal, round, and reactive to light. No scleral icterus.  Neck: Normal range of motion. Neck supple. No thyromegaly present.  Cardiovascular: Normal rate and regular rhythm.  Exam reveals no gallop and no friction rub.   No murmur heard. Pulmonary/Chest: Effort normal and breath sounds normal. No respiratory distress. She has no wheezes. She has no rales.  Abdominal: Soft. Bowel sounds are normal. She exhibits no distension. There is no tenderness. There is no rebound.  Musculoskeletal: Normal range of motion.  Neurological: She is alert and oriented to person, place, and time.  Skin: Skin is warm and dry. No rash noted.  Psychiatric: She has a normal mood and affect. Her behavior is normal.    ED Course  Procedures (including critical care time) Labs Review Labs Reviewed  BASIC METABOLIC PANEL - Abnormal; Notable for the following:    Glucose, Bld 130 (*)    All other components within normal limits  CBC - Abnormal; Notable for the following:    MCHC 36.4 (*)    All other components within normal limits    Imaging Review No results found.   EKG Interpretation   Date/Time:  Wednesday February 16 2015 12:07:23 EDT Ventricular Rate:  71 PR Interval:  158 QRS Duration: 82 QT Interval:  384 QTC Calculation: 417 R Axis:    64 Text Interpretation:  Sinus rhythm Ventricular premature complex ED  PHYSICIAN INTERPRETATION AVAILABLE IN [facility] Confirmed by TEST,  Record ([id number]) on 02/17/2015 6:41:22 AM      MDM   Final diagnoses:  Hyperventilation    Patient clearly describes an episode of hyperventilation. Remains asymptomatic here. Normal resting EKG, normal hemoglobin, normal electrolytes. Anxious appropriate for continued outpatient follow-up with her pulmonologist regarding her chronic cough.   Rolland PorterMark Johnpaul Gillentine, MD 02/17/15 [phone]

## 2015-02-16 NOTE — Patient Instructions (Signed)
EMS transport to ER   

## 2015-02-16 NOTE — Discharge Instructions (Signed)
Hyperventilation °Hyperventilation is breathing that is deeper and more rapid than normal. It is usually associated with panic and anxiety. Hyperventilation can make you feel breathless. It is sometimes called overbreathing. Breathing out too much causes a decrease in the amount of carbon dioxide gas in the blood. This leads to tingling and numbness in the hands, feet, and around the mouth. If this continues, your fingers, hands, and toes may begin to spasm. Hyperventilation usually lasts 20-30 minutes and can be associated with other symptoms of panic and anxiety, including:  °· Chest pains or tightness. °· A pounding or irregular, racing heartbeat (palpitations). °· Dizziness. °· Lightheadedness. °· Dry mouth. °· Weakness. °· Confusion. °· Sleep disturbance. °CAUSES  °Sudden onset (acute) hyperventilation is usually triggered by acute stress, anxiety, or emotional upset. Long-term (chronic) and recurring hyperventilation can occur with chronic lung problems, such emphysema or asthma. Other causes include:  °· Nervousness. °· Stress. °· Stimulant, drug, or alcohol use. °· Lung disease. °· Infections, such as pneumonia. °· Heart problems. °· Severe pain. °· Waking from a bad dream. °· Pregnancy. °· Bleeding. °HOME CARE INSTRUCTIONS °· Learn and use breathing exercises that help you breathe from your diaphragm and abdomen. °· Practice relaxation techniques to reduce stress, such as visualization, meditation, and muscle release. °· During an attack, try breathing into a paper bag. This changes the carbon dioxide level and slows down breathing. °SEEK IMMEDIATE MEDICAL CARE IF: °· Your hyperventilation continues or gets worse. °MAKE SURE YOU: °· Understand these instructions. °· Will watch your condition. °· Will get help right away if you are not doing well or get worse. °Document Released: 07/13/2000 Document Revised: 01/15/2012 Document Reviewed: 10/25/2011 °[employer]® Patient Information ©2015 [employer]. This  information is not intended to replace advice given to you by your health care provider. Make sure you discuss any questions you have with your health care provider. ° °

## 2015-02-16 NOTE — ED Notes (Signed)
MD James at the bedside  

## 2015-02-18 ENCOUNTER — Ambulatory Visit (INDEPENDENT_AMBULATORY_CARE_PROVIDER_SITE_OTHER): Payer: Medicare Other | Admitting: Internal Medicine

## 2015-02-18 ENCOUNTER — Other Ambulatory Visit (INDEPENDENT_AMBULATORY_CARE_PROVIDER_SITE_OTHER): Payer: Medicare Other

## 2015-02-18 ENCOUNTER — Encounter: Payer: Self-pay | Admitting: Internal Medicine

## 2015-02-18 VITALS — BP 122/70 | HR 62 | Ht 64.5 in | Wt 175.4 lb

## 2015-02-18 DIAGNOSIS — R05 Cough: Secondary | ICD-10-CM | POA: Diagnosis not present

## 2015-02-18 DIAGNOSIS — R0600 Dyspnea, unspecified: Secondary | ICD-10-CM

## 2015-02-18 DIAGNOSIS — E039 Hypothyroidism, unspecified: Secondary | ICD-10-CM

## 2015-02-18 DIAGNOSIS — I10 Essential (primary) hypertension: Secondary | ICD-10-CM | POA: Diagnosis not present

## 2015-02-18 DIAGNOSIS — R058 Other specified cough: Secondary | ICD-10-CM

## 2015-02-18 LAB — CBC WITH DIFFERENTIAL/PLATELET
Basophils Absolute: 0.1 10*3/uL (ref 0.0–0.1)
Basophils Relative: 0.8 % (ref 0.0–3.0)
EOS ABS: 0.3 10*3/uL (ref 0.0–0.7)
EOS PCT: 3.9 % (ref 0.0–5.0)
HCT: 41.9 % (ref 36.0–46.0)
Hemoglobin: 14.5 g/dL (ref 12.0–15.0)
LYMPHS PCT: 18.2 % (ref 12.0–46.0)
Lymphs Abs: 1.3 10*3/uL (ref 0.7–4.0)
MCHC: 34.5 g/dL (ref 30.0–36.0)
MCV: 94.8 fl (ref 78.0–100.0)
MONOS PCT: 6.1 % (ref 3.0–12.0)
Monocytes Absolute: 0.4 10*3/uL (ref 0.1–1.0)
NEUTROS ABS: 5.2 10*3/uL (ref 1.4–7.7)
Neutrophils Relative %: 71 % (ref 43.0–77.0)
Platelets: 277 10*3/uL (ref 150.0–400.0)
RBC: 4.43 Mil/uL (ref 3.87–5.11)
RDW: 12.9 % (ref 11.5–15.5)
WBC: 7.3 10*3/uL (ref 4.0–10.5)

## 2015-02-18 LAB — BRAIN NATRIURETIC PEPTIDE: PRO B NATRI PEPTIDE: 167 pg/mL — AB (ref 0.0–100.0)

## 2015-02-18 LAB — TSH: TSH: 6.34 u[IU]/mL — AB (ref 0.35–4.50)

## 2015-02-18 MED ORDER — MONTELUKAST SODIUM 10 MG PO TABS: ORAL_TABLET | ORAL | Status: DC

## 2015-02-18 MED ORDER — FAMOTIDINE 20 MG PO TABS: ORAL_TABLET | ORAL | Status: DC

## 2015-02-18 MED ORDER — PREDNISONE 10 MG PO TABS: ORAL_TABLET | ORAL | Status: DC

## 2015-02-18 NOTE — Progress Notes (Signed)
Subjective:    Patient ID: Kristina [name], female    DOB: Dec 28, 1945   MRN: [id number]   Brief patient profile:  61 yowf never smoked never resp problems new onset cough Jan 2016  assoc with abrupt uri while on acei "the worst cough ever" referred to pulmonary clinic 01/07/2015  by Dr Kevan Ny p d/c acei x May 2016 and still coughing.    History of Present Illness  01/07/2015 1st [facility] office visit/ Jozef Eisenbeis   Chief Complaint  Patient presents with  . Pulmonary Consult    Referred by Dr. Shaune Pollack. Pt c/o cough for the past 6 months.  Cough is prod with minimal yellow to clear sputum. She has noticed cough can be triggered by laughing, bending over, and with exertion.   acute onset cough Jan 2016  with fever / dry cough / fatigue / unable to sing due to hoarseness rx with cough syrup/zpak and fever resolved but cough never did ever  since and acei stopped early May 2016  No better. Cough is more day than night and never wakes her up in am or even bothers her for the first few hours of the day but is then present for the rest of the day every day rec Stop cozar and start bystolic  daily x 2 weeks  Prednisone 10 mg take  4 each am x 2 days,   2 each am x 2 days,  1 each am x 2 days and stop  Increase prilosec to 40 mg  Take  30-60 min before first meal of the day and  Add over the counter Pepcid (famotidine)  20 mg one @  bedtime until return to office - this is the best way to tell whether stomach acid is contributing to your problem.   GERD diet  Please schedule a follow up office visit in 2 weeks, sooner if needed to see [name] to recheck bp and start process Add if not better rec trial of singulair/ allergy profile/ sinus ct then f/u with me for ? MCT    01/21/2015 NP Follow up  Patient returns for a two-week follow-up He was seen last visit for pulmonary consult for cough that has been present since January 2016. Blood pressure has been well controlled. She was also  given a prednisone taper, which she says has helped her cough better since she finished her prednisone. Her cough is starting to slowly return. She does have postnasal drainage. She also has intermittent yellow mucus. Chest x-ray last visit showed clear lungs. Patient is a never smoker She denies any hemoptysis, orthopnea, PND or leg swelling. Is not currently using any medications for cough or drainage. >>CT sinus > neg  rec Continue on Bystolic  daily .  Continue on Omeprazole  daily in am before meal  Continue on Pepcid  At bedtime    Labs today , CBC with diff, RAST /IgE.  Mucinex Twice daily  As needed  Cough/congestion  Delsym 2 tsp Twice daily  As needed  Cough .  Begin Zyrtec  At bedtime    02/16/2015 Acute OV / NP  Pt presented to office for an acute work in visit.  She had PFT this am that was normal with no airflow obstruction/restriction , +BD response Normal DLCO .  Right after PFT she became very weak, near syncope .  EMS was called for emergency transport.  She was brought back to exam room.  Complained of chest pressure ,  tightness, and arm tingling.  Speech became irregular and slightly slurred.  BP ~170. HR 80. Sats 100% on RA.  Blood sugar 158.  She was placed on 2l/m [place] O2 . IV was placed with NS IVF.  Placed on continuous heart monitor.  Dr. Sherene Sires  Aware and in room with pt exam.  Pt did improve with normal speech on arrival of EMS.  She remained alert to person and surroundings.  EMS report given and pt transported to [facility] ER.> dx hyperventilation syndrome   02/18/2015 [name]  Chief Complaint  Patient presents with  . Follow-up    PFT done 02/16/15. Pt states her cough has been worse for the past 10 days. She states had some SOB walking to the end of her driveway and back yesterday.   voice is better and can now sing but laughing makes her cough   Her usual walk up the driveway made her sob at top of drive x 50 fts x  one day prior to OV  Which is new  Am mucus is slt yellow / then minimal and no more color the rest of the day     No obvious day to day or daytime variability or assoc  cp or chest tightness, subjective wheeze or overt sinus or hb symptoms. No unusual exp hx or h/o childhood pna/ asthma or knowledge of premature birth.  Sleeping ok flat  without nocturnal exacerbation  of respiratory  c/o's or need for noct saba. Also denies any obvious fluctuation of symptoms with weather or environmental changes or other aggravating or alleviating factors except as outlined above   Current Medications, Allergies, Complete Past Medical History, Past Surgical History, Family History, and Social History were reviewed in [employer] record.  ROS  The following are not active complaints unless bolded sore throat, dysphagia, dental problems, itching, sneezing,  nasal congestion or excess/ purulent secretions, ear ache,   fever, chills, sweats, unintended wt loss, classically pleuritic or exertional cp, hemoptysis,  orthopnea pnd or leg swelling, presyncope, palpitations, abdominal pain, anorexia, nausea, vomiting, diarrhea  or change in bowel or bladder habits, change in stools or urine, dysuria,hematuria,  rash, arthralgias, visual complaints, headache, numbness, weakness or ataxia or problems with walking or coordination,  change in mood/affect or memory.            Objective:   Physical Exam   amb wf  Wt Readings from Last 3 Encounters:  02/18/15 175 lb 6.4 oz (79.561 kg)  01/21/15 175 lb (79.379 kg)  01/07/15 169 lb (76.658 kg)    Vital signs reviewed     HEENT: nl dentition, turbinates, and orophanx. Nl external ear canals without cough reflex   NECK :  without JVD/Nodes/TM/ nl carotid upstrokes bilaterally   LUNGS: no acc muscle use,  Trace pseudowheeze only resolves with plm   CV:  RRR  no s3 or murmur or increase in P2, no edema   ABD:  soft and nontender with nl  excursion in the supine position. No bruits or organomegaly, bowel sounds nl  MS:  warm without deformities, calf tenderness, cyanosis or clubbing  SKIN: warm and dry without lesions    NEURO:  Alert x 3 .      CXR PA and Lateral:   01/07/2015 :     There is no active cardiopulmonary disease   Labs ordered/ reviewed:   Lab 02/16/15 1241  NA 138  K 4.0  CL 101  CO2 28  BUN 10  CREATININE 0.83  GLUCOSE 130*     Lab 02/16/15 1241 02/18/15 0951  HGB 13.9 14.5  HCT 38.2 41.9  WBC 8.2 7.3  PLT 236 277.0     Lab Results  Component Value Date   TSH 6.34* 02/18/2015     Lab Results  Component Value Date   PROBNP 167.0* 02/18/2015               Assessment & Plan:   Outpatient Encounter Prescriptions as of 02/18/2015  Medication Sig  . acetaminophen (TYLENOL) 500 MG tablet Take 500 mg by mouth every 6 (six) hours as needed.  Marland Kitchen amLODipine (NORVASC) 5 MG tablet Take 1 tablet (5 mg total) by mouth daily.  Marland Kitchen aspirin 81 MG tablet Take 81 mg by mouth daily.  . Calcium-Magnesium-Vitamin D (CALCIUM 500 PO) Take 1 tablet by mouth daily.  . hydrochlorothiazide (HYDRODIURIL) 25 MG tablet Take 25 mg by mouth daily.  . insulin glargine (LANTUS) 100 UNIT/ML injection Inject 13-14 Units into the skin at bedtime.   . metFORMIN (GLUCOPHAGE) 500 MG tablet Take 2,000 mg by mouth every evening. 4 tabs po qd  . metroNIDAZOLE (METROCREAM) 0.75 % cream Apply topically 2 (two) times daily.  . nebivolol (BYSTOLIC) 10 MG tablet Take 1 tablet (10 mg total) by mouth daily.  . simvastatin (ZOCOR) 20 MG tablet Take 1 tablet by mouth daily.  . [DISCONTINUED] traMADol (ULTRAM) 50 MG tablet Take 1 tablet (50 mg total) by mouth every 4 (four) hours as needed.  . famotidine (PEPCID) 20 MG tablet One after breakdfast and One at bedtime  . montelukast (SINGULAIR) 10 MG tablet One at bedtime every night  . predniSONE (DELTASONE) 10 MG tablet Take  4 each am x 2 days,   2 each am x 2 days,  1 each am  x 2 days and stop   No facility-administered encounter medications on file as of 02/18/2015.

## 2015-02-18 NOTE — Patient Instructions (Addendum)
Please call me with the name of the first inhaler that worked and the second that didn't   Be sure my list is correct  Prednisone 10 mg take  4 each am x 2 days,   2 each am x 2 days,  1 each am x 2 days and stop   Add singulair (montelukast) 10 mg each pm until return   No other changes in meds - continue pepcid ac 20 mg after bfast and at bedtime   Please remember to go to the lab  department downstairs for your tests - we will call you with the results when they are available.    Please schedule a follow up office visit in 2 weeks, sooner if needed to [name] with all meds in hand (not med calendar)

## 2015-02-19 ENCOUNTER — Encounter: Payer: Self-pay | Admitting: Internal Medicine

## 2015-02-19 DIAGNOSIS — E039 Hypothyroidism, unspecified: Secondary | ICD-10-CM | POA: Insufficient documentation

## 2015-02-19 NOTE — Assessment & Plan Note (Addendum)
-   off acei since early May 2016 - dc cozar 01/07/2015    CT sinus 01/26/2015  >>Normal CT of the paranasal sinuses. - GI eval by Dorena Cookey 01/26/15 > did not feel gerd an issue/ did not rec any w/u for this  -  01/21/15  Allergy profile  Eos 0.3   RAST neg , IgE 55   It's now been 2 months since acei exposure and although this would not qualify now as the "worse cough in her life" she does need to proceed with the cough protocol > she now reports one of the inhalers she took previously helped a lot and the other didn't so could have unrelated to the UACS a component of cough variant asthma for which she hasn't tried singulair but if not better next step would be aggressive gerd rx and then do methachol challeng   I had an extended discussion with the patient reviewing all relevant studies completed to date and  lasting 15 to 20 minutes of a 25 minute visit    The standardized cough guidelines published in Chest by [place] in 2006 are still the best available and consist of a multiple step process (up to 12!) , not a single office visit,  and are intended  to address this problem logically,  with an alogrithm dependent on response to empiric treatment at  each progressive step  to determine a specific diagnosis with  minimal addtional testing needed. Therefore if adherence is an issue or can't be accurately verified,  it's very unlikely the standard evaluation and treatment will be successful here.    Furthermore, response to therapy (other than acute cough suppression, which should only be used short term with avoidance of narcotic containing cough syrups if possible), can be a gradual process for which the patient may perceive immediate benefit.  Unlike going to an eye doctor where the best perscription is almost always the first one and is immediately effective, this is almost never the case in the management of chronic cough syndromes. Therefore the patient needs to commit up front to  consistently adhere to recommendations  for up to 6 weeks of therapy directed at the likely underlying problem(s) before the response can be reasonably evaluated.   Each maintenance medication was reviewed in detail including most importantly the difference between maintenance and prns and under what circumstances the prns are to be triggered using an action plan format that is not reflected in the computer generated alphabetically organized AVS.    Please see instructions for details which were reviewed in writing and the patient given a copy highlighting the part that I personally wrote and discussed at today's ov.

## 2015-02-19 NOTE — Assessment & Plan Note (Signed)
D/c acei early may 2016 and arb 01/07/2015 due to cough   Adequate control on present rx, reviewed > no change in rx needed

## 2015-02-19 NOTE — Assessment & Plan Note (Signed)
New onset 02/17/15  - 02/18/2015  Walked RA x 3 laps @ 185 ft each stopped due to End of study, nl pace, no sob or desat   - 02/16/15 pfts completely wnl  Not able to reproduce this complaint in clinic ? Anxiety related > appeared to have panic attack p pfts confirmed by er EVal   rec resume regular paced walking

## 2015-02-19 NOTE — Assessment & Plan Note (Signed)
Lab Results  Component Value Date   TSH 6.34* 02/18/2015    Defer rx/ f/u to primary care

## 2015-02-21 ENCOUNTER — Telehealth: Payer: Self-pay | Admitting: Internal Medicine

## 2015-02-21 NOTE — Telephone Encounter (Signed)
Patient notified of lab results. Nothing further needed.  

## 2015-02-21 NOTE — Progress Notes (Signed)
Quick Note:  LMTCB ______ 

## 2015-03-01 ENCOUNTER — Telehealth: Payer: Self-pay | Admitting: Internal Medicine

## 2015-03-01 NOTE — Telephone Encounter (Signed)
Fine to remain off prilosec .  Cont on Pepcid at At bedtime   Begin delsym Twice daily  Until seen back in office  Please contact office for sooner follow up if symptoms do not improve or worsen or seek emergency care

## 2015-03-01 NOTE — Telephone Encounter (Signed)
I called made pt aware of below. Nothing further needed 

## 2015-03-01 NOTE — Telephone Encounter (Signed)
Spoke with the pt  She states that her cough almost completely resolved on pred taper, and once finished cough started back again just like it was She states that she is coughing until the point of losing urinary continence  She is also c/o itching under her breast, sides and back- "like I did when I took omeprazole"- she is concerned pepcid is causing this but she still takes med  She has also had loose stools and wonders if pepcid cough cause this She was started on synthroid recently and was advised not to take antacids within 4 hrs of taking synthroid   I advised will check with doc on call about her concerns and in the meantime advised her to take delsym 2 tsp every 12 hours prn cough

## 2015-03-08 ENCOUNTER — Ambulatory Visit (INDEPENDENT_AMBULATORY_CARE_PROVIDER_SITE_OTHER): Payer: Medicare Other | Admitting: Adult Health

## 2015-03-08 ENCOUNTER — Encounter: Payer: Self-pay | Admitting: Adult Health

## 2015-03-08 VITALS — BP 122/88 | HR 71 | Temp 98.1°F | Ht 64.0 in | Wt 174.0 lb

## 2015-03-08 DIAGNOSIS — J209 Acute bronchitis, unspecified: Secondary | ICD-10-CM | POA: Insufficient documentation

## 2015-03-08 DIAGNOSIS — R05 Cough: Secondary | ICD-10-CM | POA: Diagnosis not present

## 2015-03-08 DIAGNOSIS — R058 Other specified cough: Secondary | ICD-10-CM

## 2015-03-08 MED ORDER — BECLOMETHASONE DIPROPIONATE 80 MCG/ACT IN AERS: 2.0000 | INHALATION_SPRAY | Freq: Two times a day (BID) | RESPIRATORY_TRACT | Status: DC

## 2015-03-08 MED ORDER — AMOXICILLIN-POT CLAVULANATE 875-125 MG PO TABS: 1.0000 | ORAL_TABLET | Freq: Two times a day (BID) | ORAL | Status: AC

## 2015-03-08 NOTE — Patient Instructions (Addendum)
Augmentin  Twice daily  .  Begin QVAR 80 2 puffs Twice daily  , rinse after use.  Mucinex Twice daily  As needed  Cough/congestion  Delsym 2 tsp Twice daily  As needed  Cough .  Begin Zyrtec  At bedtime  .  Voice rest , no singing for now.  Follow up Dr. Sherene Sires  In 6 weeks and As needed   Please contact office for sooner follow up if symptoms do not improve or worsen or seek emergency care

## 2015-03-08 NOTE — Progress Notes (Signed)
Subjective:    Patient ID: Kristina [name], female    DOB: August 01, 1945   MRN: [id number]   Brief patient profile:  75 yowf never smoked never resp problems new onset cough Jan 2016  assoc with abrupt uri while on acei "the worst cough ever" referred to pulmonary clinic 01/07/2015  by Dr Kevan Ny p d/c acei x May 2016 and still coughing.    History of Present Illness  01/07/2015 1st [facility] office visit/ Wert   Chief Complaint  Patient presents with  . Pulmonary Consult    Referred by Dr. Shaune Pollack. Pt c/o cough for the past 6 months.  Cough is prod with minimal yellow to clear sputum. She has noticed cough can be triggered by laughing, bending over, and with exertion.   acute onset cough Jan 2016  with fever / dry cough / fatigue / unable to sing due to hoarseness rx with cough syrup/zpak and fever resolved but cough never did ever  since and acei stopped early May 2016  No better. Cough is more day than night and never wakes her up in am or even bothers her for the first few hours of the day but is then present for the rest of the day every day rec Stop cozar and start bystolic  daily x 2 weeks  Prednisone 10 mg take  4 each am x 2 days,   2 each am x 2 days,  1 each am x 2 days and stop  Increase prilosec to 40 mg  Take  30-60 min before first meal of the day and  Add over the counter Pepcid (famotidine)  20 mg one @  bedtime until return to office - this is the best way to tell whether stomach acid is contributing to your problem.   GERD diet  Please schedule a follow up office visit in 2 weeks, sooner if needed to see Nailyn Dearinger NP to recheck bp and start process Add if not better rec trial of singulair/ allergy profile/ sinus ct then f/u with me for ? MCT    01/21/2015 NP Follow up  Patient returns for a two-week follow-up He was seen last visit for pulmonary consult for cough that has been present since January 2016. Blood pressure has been well controlled. She was also  given a prednisone taper, which she says has helped her cough better since she finished her prednisone. Her cough is starting to slowly return. She does have postnasal drainage. She also has intermittent yellow mucus. Chest x-ray last visit showed clear lungs. Patient is a never smoker She denies any hemoptysis, orthopnea, PND or leg swelling. Is not currently using any medications for cough or drainage. >>CT sinus > neg  rec Continue on Bystolic  daily .  Continue on Omeprazole  daily in am before meal  Continue on Pepcid  At bedtime    Labs today , CBC with diff, RAST /IgE.  Mucinex Twice daily  As needed  Cough/congestion  Delsym 2 tsp Twice daily  As needed  Cough .  Begin Zyrtec  At bedtime    02/16/2015 Acute OV / NP  Pt presented to office for an acute work in visit.  She had PFT this am that was normal with no airflow obstruction/restriction , +BD response Normal DLCO .  Right after PFT she became very weak, near syncope .  EMS was called for emergency transport.  She was brought back to exam room.  Complained of chest pressure ,  tightness, and arm tingling.  Speech became irregular and slightly slurred.  BP ~170. HR 80. Sats 100% on RA.  Blood sugar 158.  She was placed on 2l/m [place] O2 . IV was placed with NS IVF.  Placed on continuous heart monitor.  Dr. Sherene Sires  Aware and in room with pt exam.  Pt did improve with normal speech on arrival of EMS.  She remained alert to person and surroundings.  EMS report given and pt transported to [facility] ER.> dx hyperventilation syndrome   02/18/2015 f/u ov/Wert re: chronic cough  Chief Complaint  Patient presents with  . Follow-up    PFT done 02/16/15. Pt states her cough has been worse for the past 10 days. She states had some SOB walking to the end of her driveway and back yesterday.   voice is better and can now sing but laughing makes her cough   Her usual walk up the driveway made her sob at top of drive x 50 fts x  one day prior to OV  Which is new  Am mucus is slt yellow / then minimal and no more color the rest of the day     >>pred taper   03/08/2015 Follow up : Chronic cough  Pt presents for a 2 week follow up .  Pt completed Pred taper and cough has returned.  Did feel better on steroids. Cough came back off steroids and now has thick mucus.  Pt c/o prod cough with yellow/green mucus. Denies any chest congestion/tightness, wheezing or SOB.  Started on singulair last ov  Previous CT sinus and cxr with no acute process.  Does have drainage in throat .  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   TSH was slightly elevated last ov, started on synthroid by PCP .    Current Medications, Allergies, Complete Past Medical History, Past Surgical History, Family History, and Social History were reviewed in [employer] record.  ROS  The following are not active complaints unless bolded sore throat, dysphagia, dental problems, itching, sneezing,  nasal congestion or excess/ purulent secretions, ear ache,   fever, chills, sweats, unintended wt loss, classically pleuritic or exertional cp, hemoptysis,  orthopnea pnd or leg swelling, presyncope, palpitations, abdominal pain, anorexia, nausea, vomiting, diarrhea  or change in bowel or bladder habits, change in stools or urine, dysuria,hematuria,  rash, arthralgias, visual complaints, headache, numbness, weakness or ataxia or problems with walking or coordination,  change in mood/affect or memory.            Objective:   Physical Exam   amb wf   Vital signs reviewed     HEENT: nl dentition, turbinates, and orophanx. Nl external ear canals without cough reflex   NECK :  without JVD/Nodes/TM/ nl carotid upstrokes bilaterally   LUNGS: no acc muscle use,  CTA   CV:  RRR  no s3 or murmur or increase in P2, no edema   ABD:  soft and nontender with nl excursion in the supine position. No bruits or organomegaly, bowel sounds  nl  MS:  warm without deformities, calf tenderness, cyanosis or clubbing  SKIN: warm and dry without lesions    NEURO:  Alert x 3 .      CXR PA and Lateral:   01/07/2015 :     There is no active cardiopulmonary disease   Labs ordered/ reviewed:   Lab 02/16/15 1241  NA 138  K 4.0  CL 101  CO2  28  BUN 10  CREATININE 0.83  GLUCOSE 130*     Lab 02/16/15 1241 02/18/15 0951  HGB 13.9 14.5  HCT 38.2 41.9  WBC 8.2 7.3  PLT 236 277.0     Lab Results  Component Value Date   TSH 6.34* 02/18/2015     Lab Results  Component Value Date   PROBNP 167.0* 02/18/2015               Assessment & Plan:

## 2015-03-08 NOTE — Assessment & Plan Note (Signed)
Augmentin  Twice daily  .  Mucinex Twice daily  As needed  Cough/congestion  Delsym 2 tsp Twice daily  As needed  Cough .  Begin Zyrtec  At bedtime  .  Voice rest , no singing for now.  Follow up Dr. Sherene Sires  In 6 weeks and As needed   Please contact office for sooner follow up if symptoms do not improve or worsen or seek emergency care

## 2015-03-08 NOTE — Assessment & Plan Note (Signed)
UACS ? Aggravated by GERD /AR +/- Bronchitis  Cont w/ cough triggers.   Plan  Augmentin  Twice daily  .  Begin QVAR 80 2 puffs Twice daily  , rinse after use.  Mucinex Twice daily  As needed  Cough/congestion  Delsym 2 tsp Twice daily  As needed  Cough .  Begin Zyrtec  At bedtime  .  Voice rest , no singing for now.  Follow up Dr. Sherene Sires  In 6 weeks and As needed   Please contact office for sooner follow up if symptoms do not improve or worsen or seek emergency care

## 2015-03-09 NOTE — Progress Notes (Signed)
Chart and office note reviewed in detail along with available xrays/ labs > agree with a/p as outlined  

## 2015-03-11 ENCOUNTER — Telehealth: Payer: Self-pay | Admitting: Internal Medicine

## 2015-03-11 NOTE — Telephone Encounter (Signed)
Patient says she is feeling fine now, but had a problem with SOB when she came out of [employer]. Patient says that since she has been home and sitting in her chair she has been fine and has not had any other episodes of SOB.    FYI to Dr. Sherene Sires

## 2015-03-24 ENCOUNTER — Other Ambulatory Visit: Payer: Self-pay | Admitting: Cardiovascular Disease

## 2015-04-19 ENCOUNTER — Ambulatory Visit (INDEPENDENT_AMBULATORY_CARE_PROVIDER_SITE_OTHER): Payer: Medicare Other | Admitting: Internal Medicine

## 2015-04-19 ENCOUNTER — Encounter: Payer: Self-pay | Admitting: Internal Medicine

## 2015-04-19 VITALS — BP 144/90 | HR 63 | Ht 64.0 in | Wt 179.0 lb

## 2015-04-19 DIAGNOSIS — J45991 Cough variant asthma: Secondary | ICD-10-CM | POA: Insufficient documentation

## 2015-04-19 DIAGNOSIS — Z23 Encounter for immunization: Secondary | ICD-10-CM | POA: Diagnosis not present

## 2015-04-19 DIAGNOSIS — E669 Obesity, unspecified: Secondary | ICD-10-CM

## 2015-04-19 DIAGNOSIS — R05 Cough: Secondary | ICD-10-CM | POA: Diagnosis not present

## 2015-04-19 DIAGNOSIS — R058 Other specified cough: Secondary | ICD-10-CM

## 2015-04-19 DIAGNOSIS — I10 Essential (primary) hypertension: Secondary | ICD-10-CM

## 2015-04-19 NOTE — Progress Notes (Signed)
Subjective:    Patient ID: Kristina [name], female    DOB: 01-06-1946   MRN: [id number]   Brief patient profile:  72 yowf never smoked never resp problems new onset cough Jan 2016  assoc with abrupt uri while on acei "the worst cough ever" referred to pulmonary clinic 01/07/2015  by Dr Kevan Ny p d/c acei x May 2016 and still coughing.    History of Present Illness  01/07/2015 1st [facility] office visit/ [facility]   Chief Complaint  Patient presents with  . Pulmonary Consult    Referred by Dr. Shaune Pollack. Pt c/o cough for the past 6 months.  Cough is prod with minimal yellow to clear sputum. She has noticed cough can be triggered by laughing, bending over, and with exertion.   acute onset cough Jan 2016  with fever / dry cough / fatigue / unable to sing due to hoarseness rx with cough syrup/zpak and fever resolved but cough never did ever  since and acei stopped early May 2016  No better. Cough is more day than night and never wakes her up in am or even bothers her for the first few hours of the day but is then present for the rest of the day every day rec Stop cozar and start bystolic  daily x 2 weeks  Prednisone 10 mg take  4 each am x 2 days,   2 each am x 2 days,  1 each am x 2 days and stop  Increase prilosec to 40 mg  Take  30-60 min before first meal of the day and  Add over the counter Pepcid (famotidine)  20 mg one @  bedtime until return to office - this is the best way to tell whether stomach acid is contributing to your problem.   GERD diet  Please schedule a follow up office visit in 2 weeks, sooner if needed to see [name] to recheck bp and start process Add if not better rec trial of singulair/ allergy profile/ sinus ct then f/u with me for ? MCT    01/21/2015 NP Follow up  Patient returns for a two-week follow-up He was seen last visit for pulmonary consult for cough that has been present since January 2016. Blood pressure has been well controlled. She was also  given a prednisone taper, which she says has helped her cough better since she finished her prednisone. Her cough is starting to slowly return. She does have postnasal drainage. She also has intermittent yellow mucus. Chest x-ray last visit showed clear lungs. Patient is a never smoker She denies any hemoptysis, orthopnea, PND or leg swelling. Is not currently using any medications for cough or drainage. >>CT sinus > neg  rec Continue on Bystolic  daily .  Continue on Omeprazole  daily in am before meal  Continue on Pepcid  At bedtime    Labs today , CBC with diff, RAST /IgE.  Mucinex Twice daily  As needed  Cough/congestion  Delsym 2 tsp Twice daily  As needed  Cough .  Begin Zyrtec  At bedtime    02/16/2015 Acute OV / NP  Pt presented to office for an acute work in visit.  She had PFT this am that was normal with no airflow obstruction/restriction , +BD response Normal DLCO .  Right after PFT she became very weak, near syncope .  EMS was called for emergency transport.  She was brought back to exam room.  Complained of chest pressure ,  tightness, and arm tingling.  Speech became irregular and slightly slurred.  BP ~170. HR 80. Sats 100% on RA.  Blood sugar 158.  She was placed on 2l/m [place] O2 . IV was placed with NS IVF.  Placed on continuous heart monitor.  Dr. Sherene Sires  Aware and in room with pt exam.  Pt did improve with normal speech on arrival of EMS.  She remained alert to person and surroundings.  EMS report given and pt transported to [facility] ER.> dx hyperventilation syndrome   02/18/2015 f/u ov/[facility] re: chronic cough  Chief Complaint  Patient presents with  . Follow-up    PFT done 02/16/15. Pt states her cough has been worse for the past 10 days. She states had some SOB walking to the end of her driveway and back yesterday.   voice is better and can now sing but laughing makes her cough   Her usual walk up the driveway made her sob at top of drive x 50 fts x  one day prior to OV  Which is new  Am mucus is slt yellow / then minimal and no more color the rest of the day  rec Please call me with the name of the first inhaler that worked and the second that didn't > never called  Be sure my list is correct Prednisone 10 mg take  4 each am x 2 days,   2 each am x 2 days,  1 each am x 2 days and stop  Add singulair (montelukast) 10 mg each pm until return  No other changes in meds - continue pepcid ac 20 mg after bfast and at bedtime         03/08/2015 Follow up : Chronic cough  Pt presents for a 2 week follow up .  Pt completed Pred taper and cough has returned.  Did feel better on steroids. Cough came back off steroids and now has thick mucus.  Pt c/o prod cough with yellow/green mucus. Denies any chest congestion/tightness, wheezing or SOB.  Started on singulair last ov  Previous CT sinus and cxr with no acute process.  Does have drainage in throat .  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  TSH was slightly elevated last ov, started on synthroid by PCP .  rec Augmentin  Twice daily  .  Begin QVAR 80 2 puffs Twice daily  , rinse after use.  Mucinex Twice daily  As needed  Cough/congestion  Delsym 2 tsp Twice daily  As needed  Cough .  Begin Zyrtec  At bedtime  .  Voice rest , no singing for now.  Follow up Dr. Sherene Sires  In 6 weeks and As needed      04/19/2015 f/u ov/[facility] re:  Coughs about half way through exercise  Chief Complaint  Patient presents with  . Follow-up    Pt states that her cough has improved some over the past few days. She states that on days when she exercises she coughs up more yellow sputum.       No noct or early am cough Good extol On qvar 80 2bid though technique poor  No obvious day to day or daytime variability or assoc  cp or chest tightness, subjective wheeze or overt sinus or hb symptoms. No unusual exp hx or h/o childhood pna/ asthma or knowledge of premature birth.  Sleeping ok without  nocturnal  or early am exacerbation  of respiratory  c/o's or need for noct  saba. Also denies any obvious fluctuation of symptoms with weather or environmental changes or other aggravating or alleviating factors except as outlined above   Current Medications, Allergies, Complete Past Medical History, Past Surgical History, Family History, and Social History were reviewed in [employer] record.  ROS  The following are not active complaints unless bolded sore throat, dysphagia, dental problems, itching, sneezing,  nasal congestion or excess/ purulent secretions, ear ache,   fever, chills, sweats, unintended wt loss, classically pleuritic or exertional cp, hemoptysis,  orthopnea pnd or leg swelling, presyncope, palpitations, abdominal pain, anorexia, nausea, vomiting, diarrhea  or change in bowel or bladder habits, change in stools or urine, dysuria,hematuria,  rash, arthralgias, visual complaints, headache, numbness, weakness or ataxia or problems with walking or coordination,  change in mood/affect or memory.               Objective:   Physical Exam   amb wf nad   . Wt Readings from Last 3 Encounters:  04/19/15 179 lb (81.194 kg)  03/08/15 174 lb (78.926 kg)  02/18/15 175 lb 6.4 oz (79.561 kg)    Vital signs reviewed      HEENT: nl dentition, turbinates, and orophanx. Nl external ear canals without cough reflex   NECK :  without JVD/Nodes/TM/ nl carotid upstrokes bilaterally   LUNGS: no acc muscle use,  CTA bilaterally   CV:  RRR  no s3 or murmur or increase in P2, no edema   ABD:  soft and nontender with nl excursion in the supine position. No bruits or organomegaly, bowel sounds nl  MS:  warm without deformities, calf tenderness, cyanosis or clubbing  SKIN: warm and dry without lesions    NEURO:  Alert x 3 .    I personally reviewed images and agree with radiology impression as follows:  CXR:  01/07/15 There is no active cardiopulmonary  disease.            Assessment & Plan:

## 2015-04-19 NOTE — Patient Instructions (Signed)
Continue qvar 80 .Take 2 puffs first thing in am and then another 2 puffs about 12 hours later.   Work on inhaler technique:  relax and gently blow all the way out then take a nice smooth deep breath back in, triggering the inhaler at same time you start breathing in.  Hold for up to 5 seconds if you can. Blow out thru nose. Rinse and gargle with water when done  For cough try mucinex dm  every 12 hours as needed and if still having coughing fits we can get you a flutter valve next time    defer all GI issues to Dr Dorena Cookey  Please schedule a follow up office visit in 6 weeks, call sooner if needed

## 2015-04-21 ENCOUNTER — Other Ambulatory Visit: Payer: Self-pay | Admitting: Cardiovascular Disease

## 2015-04-25 NOTE — Assessment & Plan Note (Signed)
qvar 80 bid started  03/08/15  Her hx suggests she should be pred responsive but the problem is being able to use the hfa correctly  The proper method of use, as well as anticipated side effects, of a metered-dose inhaler are discussed and demonstrated to the patient. Improved effectiveness after extensive coaching during this visit to a level of approximately  75% from a baseline of 25% so clear she really hasn't given qvar a full trial yet and should do so at a dose of 2bid   I had an extended discussion with the patient reviewing all relevant studies completed to date and  lasting 15 to 20 minutes of a 25 minute visit    Each maintenance medication was reviewed in detail including most importantly the difference between maintenance and prns and under what circumstances the prns are to be triggered using an action plan format that is not reflected in the computer generated alphabetically organized AVS.    Please see instructions for details which were reviewed in writing and the patient given a copy highlighting the part that I personally wrote and discussed at today's ov.

## 2015-04-25 NOTE — Assessment & Plan Note (Signed)
D/c acei early may 2016 and arb 01/07/2015 due to cough> some better 04/19/2015   Based on tendency to cough I would avoid acei indef, esp with good bp control on bystolic10  mg daily

## 2015-04-25 NOTE — Assessment & Plan Note (Signed)
-   off acei since early May 2016 - dc cozar 01/07/2015    CT sinus 01/26/2015  >>Normal CT of the paranasal sinuses. - GI eval by Dorena Cookey 01/26/15 > did not feel gerd an issue/ did not rec any w/u for this  01/21/15  Allergy profile  Eos 0.3   RAST neg , IgE 55  -  02/18/2015 added singulair 10 mg daily  -  qvar 80 2bid 03/08/15 > improved 04/19/2015   Very difficult to sort out between asthma/ cough variant and uacs > needs to continue max gerd rx she can tol pending re-eval by GI = pepcid 20 mg bid pc

## 2015-04-25 NOTE — Assessment & Plan Note (Signed)
Body mass index is 30.71 kg/(m^2).  Lab Results  Component Value Date   TSH 6.34* 02/18/2015     Contributing to gerd tendency/ doe/reviewed the need and the process to achieve and maintain neg calorie balance > defer f/u primary care including intermittently monitoring thyroid status

## 2015-05-01 ENCOUNTER — Emergency Department (HOSPITAL_COMMUNITY): Payer: Medicare Other

## 2015-05-01 ENCOUNTER — Inpatient Hospital Stay (HOSPITAL_COMMUNITY)
Admission: EM | Admit: 2015-05-01 | Discharge: 2015-05-02 | DRG: 641 | Disposition: A | Discharge status: Home / Self Care | Payer: Medicare Other | Attending: Internal Medicine | Admitting: Internal Medicine

## 2015-05-01 ENCOUNTER — Encounter (HOSPITAL_COMMUNITY): Payer: Self-pay

## 2015-05-01 DIAGNOSIS — R112 Nausea with vomiting, unspecified: Secondary | ICD-10-CM

## 2015-05-01 DIAGNOSIS — R197 Diarrhea, unspecified: Secondary | ICD-10-CM | POA: Diagnosis not present

## 2015-05-01 DIAGNOSIS — J45998 Other asthma: Secondary | ICD-10-CM | POA: Diagnosis not present

## 2015-05-01 DIAGNOSIS — E119 Type 2 diabetes mellitus without complications: Secondary | ICD-10-CM | POA: Diagnosis present

## 2015-05-01 DIAGNOSIS — J45909 Unspecified asthma, uncomplicated: Secondary | ICD-10-CM | POA: Diagnosis present

## 2015-05-01 DIAGNOSIS — F17200 Nicotine dependence, unspecified, uncomplicated: Secondary | ICD-10-CM | POA: Diagnosis present

## 2015-05-01 DIAGNOSIS — E039 Hypothyroidism, unspecified: Secondary | ICD-10-CM

## 2015-05-01 DIAGNOSIS — E871 Hypo-osmolality and hyponatremia: Secondary | ICD-10-CM | POA: Diagnosis present

## 2015-05-01 DIAGNOSIS — I10 Essential (primary) hypertension: Secondary | ICD-10-CM | POA: Diagnosis present

## 2015-05-01 DIAGNOSIS — K219 Gastro-esophageal reflux disease without esophagitis: Secondary | ICD-10-CM | POA: Diagnosis present

## 2015-05-01 DIAGNOSIS — E785 Hyperlipidemia, unspecified: Secondary | ICD-10-CM | POA: Diagnosis present

## 2015-05-01 DIAGNOSIS — K529 Noninfective gastroenteritis and colitis, unspecified: Secondary | ICD-10-CM

## 2015-05-01 DIAGNOSIS — Z794 Long term (current) use of insulin: Secondary | ICD-10-CM | POA: Diagnosis not present

## 2015-05-01 HISTORY — DX: Disorder of thyroid, unspecified: E07.9

## 2015-05-01 HISTORY — DX: Hyperlipidemia, unspecified: E78.5

## 2015-05-01 HISTORY — DX: Essential (primary) hypertension: I10

## 2015-05-01 HISTORY — DX: Gastro-esophageal reflux disease without esophagitis: K21.9

## 2015-05-01 HISTORY — DX: Type 2 diabetes mellitus without complications: E11.9

## 2015-05-01 HISTORY — DX: Unspecified asthma, uncomplicated: J45.909

## 2015-05-01 LAB — URINALYSIS, ROUTINE W REFLEX MICROSCOPIC
BILIRUBIN URINE: NEGATIVE
Glucose, UA: 500 mg/dL — AB
Hgb urine dipstick: NEGATIVE
KETONES UR: 40 mg/dL — AB
Leukocytes, UA: NEGATIVE
NITRITE: NEGATIVE
PH: 6 (ref 5.0–8.0)
Protein, ur: 30 mg/dL — AB
Specific Gravity, Urine: 1.02 (ref 1.005–1.030)
Urobilinogen, UA: 0.2 mg/dL (ref 0.0–1.0)

## 2015-05-01 LAB — LIPASE, BLOOD
LIPASE: 12 U/L — AB (ref 22–51)
Lipase: 12 U/L — ABNORMAL LOW (ref 22–51)

## 2015-05-01 LAB — CBC
HCT: 40.2 % (ref 36.0–46.0)
HCT: 39.7 % (ref 36.0–46.0)
Hemoglobin: 15 g/dL (ref 12.0–15.0)
Hemoglobin: 14.7 g/dL (ref 12.0–15.0)
MCH: 32.5 pg (ref 26.0–34.0)
MCH: 32.5 pg (ref 26.0–34.0)
MCHC: 37.3 g/dL — ABNORMAL HIGH (ref 30.0–36.0)
MCHC: 37 g/dL — ABNORMAL HIGH (ref 30.0–36.0)
MCV: 87.2 fL (ref 78.0–100.0)
MCV: 87.6 fL (ref 78.0–100.0)
PLATELETS: 275 10*3/uL (ref 150–400)
PLATELETS: 261 10*3/uL (ref 150–400)
RBC: 4.61 MIL/uL (ref 3.87–5.11)
RBC: 4.46 MIL/uL (ref 3.87–5.11)
RDW: 12.2 % (ref 11.5–15.5)
RDW: 12.4 % (ref 11.5–15.5)
WBC: 7.3 10*3/uL (ref 4.0–10.5)
WBC: 6.9 10*3/uL (ref 4.0–10.5)

## 2015-05-01 LAB — COMPREHENSIVE METABOLIC PANEL
ALK PHOS: 62 U/L (ref 38–126)
ALT: 13 U/L — AB (ref 14–54)
AST: 29 U/L (ref 15–41)
Albumin: 4.2 g/dL (ref 3.5–5.0)
Anion gap: 15 (ref 5–15)
BILIRUBIN TOTAL: 1.5 mg/dL — AB (ref 0.3–1.2)
BUN: 9 mg/dL (ref 6–20)
CALCIUM: 9.7 mg/dL (ref 8.9–10.3)
CO2: 25 mmol/L (ref 22–32)
CREATININE: 0.76 mg/dL (ref 0.44–1.00)
Chloride: 80 mmol/L — ABNORMAL LOW (ref 101–111)
GFR calc Af Amer: >60 mL/min (ref >60)
Glucose, Bld: 219 mg/dL — ABNORMAL HIGH (ref 65–99)
Potassium: 3.5 mmol/L (ref 3.5–5.1)
Sodium: 120 mmol/L — ABNORMAL LOW (ref 135–145)
TOTAL PROTEIN: 7.1 g/dL (ref 6.5–8.1)

## 2015-05-01 LAB — TROPONIN I: Troponin I: <0.03 ng/mL (ref <0.031)

## 2015-05-01 LAB — URINE MICROSCOPIC-ADD ON

## 2015-05-01 LAB — I-STAT TROPONIN, ED: Troponin i, poc: 0 ng/mL (ref 0.00–0.08)

## 2015-05-01 LAB — CREATININE, SERUM
CREATININE: 0.61 mg/dL (ref 0.44–1.00)
GFR calc Af Amer: >60 mL/min (ref >60)
GFR calc non Af Amer: >60 mL/min (ref >60)

## 2015-05-01 LAB — GLUCOSE, CAPILLARY: GLUCOSE-CAPILLARY: 213 mg/dL — AB (ref 65–99)

## 2015-05-01 LAB — TSH: TSH: 1.4 u[IU]/mL (ref 0.350–4.500)

## 2015-05-01 LAB — MAGNESIUM: MAGNESIUM: 1.3 mg/dL — AB (ref 1.7–2.4)

## 2015-05-01 MED ORDER — ACETAMINOPHEN 325 MG PO TABS: 650.0000 mg | ORAL_TABLET | Freq: Four times a day (QID) | ORAL | Status: DC | PRN

## 2015-05-01 MED ORDER — SODIUM CHLORIDE 0.9 % IV SOLN
Freq: Once | INTRAVENOUS | Status: AC
  Administered 2015-05-01: 17:00:00 via INTRAVENOUS

## 2015-05-01 MED ORDER — LEVALBUTEROL HCL 0.63 MG/3ML IN NEBU
0.6300 mg | INHALATION_SOLUTION | Freq: Four times a day (QID) | RESPIRATORY_TRACT | Status: DC | PRN
  Filled 2015-05-01: qty 3

## 2015-05-01 MED ORDER — ONDANSETRON HCL 4 MG/2ML IJ SOLN: 4.0000 mg | Freq: Four times a day (QID) | INTRAMUSCULAR | Status: DC | PRN

## 2015-05-01 MED ORDER — NEBIVOLOL HCL 2.5 MG PO TABS
2.5000 mg | ORAL_TABLET | Freq: Every day | ORAL | Status: DC
  Administered 2015-05-01: 2.5 mg via ORAL
  Filled 2015-05-01 (×2): qty 1

## 2015-05-01 MED ORDER — ACETAMINOPHEN 650 MG RE SUPP: 650.0000 mg | Freq: Four times a day (QID) | RECTAL | Status: DC | PRN

## 2015-05-01 MED ORDER — INSULIN GLARGINE 100 UNITS/ML SOLOSTAR PEN: 14.0000 [IU] | PEN_INJECTOR | Freq: Every day | SUBCUTANEOUS | Status: DC

## 2015-05-01 MED ORDER — ONDANSETRON HCL 4 MG/2ML IJ SOLN
4.0000 mg | Freq: Once | INTRAMUSCULAR | Status: AC
  Administered 2015-05-01: 4 mg via INTRAVENOUS
  Filled 2015-05-01: qty 2

## 2015-05-01 MED ORDER — MONTELUKAST SODIUM 10 MG PO TABS
10.0000 mg | ORAL_TABLET | Freq: Every day | ORAL | Status: DC
  Administered 2015-05-01: 10 mg via ORAL
  Filled 2015-05-01: qty 1

## 2015-05-01 MED ORDER — SODIUM CHLORIDE 0.9 % IV BOLUS (SEPSIS)
1000.0000 mL | Freq: Once | INTRAVENOUS | Status: AC
  Administered 2015-05-01: 1000 mL via INTRAVENOUS

## 2015-05-01 MED ORDER — SODIUM CHLORIDE 0.9 % IV SOLN: Freq: Once | INTRAVENOUS | Status: DC

## 2015-05-01 MED ORDER — ENOXAPARIN SODIUM 40 MG/0.4ML ~~LOC~~ SOLN: 40.0000 mg | SUBCUTANEOUS | Status: DC

## 2015-05-01 MED ORDER — SIMVASTATIN 20 MG PO TABS
20.0000 mg | ORAL_TABLET | Freq: Every day | ORAL | Status: DC
Start: 2015-05-01 — End: 2015-05-02
  Administered 2015-05-01: 20 mg via ORAL
  Filled 2015-05-01: qty 1

## 2015-05-01 MED ORDER — INSULIN GLARGINE 100 UNIT/ML ~~LOC~~ SOLN
14.0000 [IU] | Freq: Every day | SUBCUTANEOUS | Status: DC
  Administered 2015-05-02: 14 [IU] via SUBCUTANEOUS
  Filled 2015-05-01 (×3): qty 0.14

## 2015-05-01 MED ORDER — ONDANSETRON HCL 4 MG/2ML IJ SOLN: 4.0000 mg | Freq: Once | INTRAMUSCULAR | Status: DC | PRN

## 2015-05-01 MED ORDER — SODIUM CHLORIDE 0.9 % IV SOLN: Freq: Once | INTRAVENOUS | Status: AC

## 2015-05-01 MED ORDER — SODIUM CHLORIDE 0.9 % IV SOLN
INTRAVENOUS | Status: DC
  Administered 2015-05-01: 18:00:00 via INTRAVENOUS

## 2015-05-01 MED ORDER — INSULIN ASPART 100 UNIT/ML ~~LOC~~ SOLN
0.0000 [IU] | Freq: Three times a day (TID) | SUBCUTANEOUS | Status: DC
  Administered 2015-05-02: 2 [IU] via SUBCUTANEOUS

## 2015-05-01 MED ORDER — TRAMADOL HCL 50 MG PO TABS: 50.0000 mg | ORAL_TABLET | Freq: Every day | ORAL | Status: DC | PRN

## 2015-05-01 MED ORDER — SODIUM CHLORIDE 0.9 % IJ SOLN: 3.0000 mL | Freq: Two times a day (BID) | INTRAMUSCULAR | Status: DC

## 2015-05-01 MED ORDER — LEVOTHYROXINE SODIUM 25 MCG PO TABS
25.0000 ug | ORAL_TABLET | Freq: Every day | ORAL | Status: DC
  Administered 2015-05-02: 25 ug via ORAL
  Filled 2015-05-01: qty 1

## 2015-05-01 MED ORDER — ONDANSETRON HCL 4 MG PO TABS: 4.0000 mg | ORAL_TABLET | Freq: Four times a day (QID) | ORAL | Status: DC | PRN

## 2015-05-01 NOTE — ED Notes (Signed)
Pt given Diet Ginger Ale for PO challenge

## 2015-05-01 NOTE — ED Provider Notes (Signed)
CSN: [id number]     Arrival date & time 05/01/15  1336 History   First MD Initiated Contact with Patient 05/01/15 1339     Chief Complaint  Patient presents with  . Emesis  . Diarrhea  . Weakness     (Consider location/radiation/quality/duration/timing/severity/associated sxs/prior Treatment) HPI  JAMILEE LAFOSSE is a 69 y.o. female with PMH significant for DM, HTN, GERD, HLD, and asthma who presents with 3 day history of generalized weakness, N/V/D along with decreased PO intake.  Patient reports multiple episodes of NBNB emesis since Thursday.  She also reports non-bloody diarrhea that began this morning.  Endorses intermittent hiccuping episodes lasting up to 45 minutes and intermittent dizziness.  Denies bloody stools, abdominal pain, chest pain, increased urinary frequency, hematuria, dysuria, HA, difficulty walking, fevers, or chills.    Past Medical History  Diagnosis Date  . Asthma   . Diabetes mellitus without complication (HCC)   . Hypertension   . Thyroid disease   . GERD (gastroesophageal reflux disease)   . Hyperlipidemia    Past Surgical History  Procedure Laterality Date  . Hammertoe    . Cholecystectomy    . Appendectomy     History reviewed. No pertinent family history. Social History  Substance Use Topics  . Smoking status: Current Every Day Smoker  . Smokeless tobacco: None  . Alcohol Use: No   OB History    No data available     Review of Systems All other systems negative unless otherwise stated in HPI    Allergies  Ciprofloxacin; Doxycycline; and Shrimp  Home Medications   Prior to Admission medications   Medication Sig Start Date End Date Taking? Authorizing Provider  acetaminophen (TYLENOL) 500 MG tablet Take 1,000 mg by mouth 2 (two) times daily as needed (pain).   Yes Historical Provider, MD  amLODipine (NORVASC) 5 MG tablet Take 5 mg by mouth daily.   Yes Historical Provider, MD  aspirin EC 81 MG tablet Take 81 mg by mouth at bedtime.    Yes Historical Provider, MD  beclomethasone (QVAR) 80 MCG/ACT inhaler Inhale 2 puffs into the lungs every 12 (twelve) hours.   Yes Historical Provider, MD  CALCIUM PO Take 500 mg by mouth at bedtime.   Yes Historical Provider, MD  Dextromethorphan-Guaifenesin (MUCINEX DM MAXIMUM STRENGTH) 60-1200 MG TB12 Take 1 tablet by mouth every 12 (twelve) hours as needed (cough).   Yes Historical Provider, MD  famotidine (PEPCID) 20 MG tablet Take 20 mg by mouth 2 (two) times daily.   Yes Historical Provider, MD  hydrochlorothiazide (HYDRODIURIL) 25 MG tablet Take 25 mg by mouth daily.   Yes Historical Provider, MD  insulin glargine (LANTUS) 100 unit/mL SOPN Inject 14 Units into the skin at bedtime.   Yes Historical Provider, MD  levothyroxine (SYNTHROID, LEVOTHROID) 25 MCG tablet Take 25 mcg by mouth daily before breakfast.   Yes Historical Provider, MD  metFORMIN (GLUCOPHAGE) 500 MG tablet Take 2,000 mg by mouth at bedtime.   Yes Historical Provider, MD  metroNIDAZOLE (METROCREAM) 0.75 % cream Apply 1 application topically daily as needed (rosacea).   Yes Historical Provider, MD  montelukast (SINGULAIR) 10 MG tablet Take 10 mg by mouth at bedtime.   Yes Historical Provider, MD  nebivolol (BYSTOLIC) 10 MG tablet Take 10 mg by mouth at bedtime.   Yes Historical Provider, MD  simvastatin (ZOCOR) 20 MG tablet Take 20 mg by mouth at bedtime.   Yes Historical Provider, MD  traMADol (ULTRAM) 50 MG tablet  Take 50 mg by mouth daily as needed (pain).   Yes Historical Provider, MD   BP 145/66 mmHg  Pulse 65  Temp(Src) 98.7 F (37.1 C) (Oral)  Resp 16  Ht  (1.626 m)  Wt 173 lb (78.472 kg)  BMI 29.68 kg/m2  SpO2 100% Physical Exam  Constitutional: She is oriented to person, place, and time. She appears well-developed and well-nourished.  HENT:  Head: Normocephalic and atraumatic.  Mouth/Throat: Oropharynx is clear and moist.  Eyes: Conjunctivae are normal.  Neck: Normal range of motion. Neck supple.   Cardiovascular: Normal rate, regular rhythm and normal heart sounds.   No murmur heard. Pulmonary/Chest: Effort normal and breath sounds normal. No respiratory distress. She has no wheezes. She has no rales.  Abdominal: Soft. Bowel sounds are normal. She exhibits no distension. There is no tenderness. There is no rebound and no guarding.  Musculoskeletal: Normal range of motion.  Lymphadenopathy:    She has no cervical adenopathy.  Neurological: She is alert and oriented to person, place, and time.  Speech is clear without dysarthria.  Moves all extremities without ataxia.  Strength and sensation intact throughout.   Skin: Skin is warm and dry.  Psychiatric: She has a normal mood and affect. Her behavior is normal.    ED Course  Procedures (including critical care time) Labs Review Labs Reviewed  LIPASE, BLOOD - Abnormal; Notable for the following:    Lipase 12 (*)    All other components within normal limits  COMPREHENSIVE METABOLIC PANEL - Abnormal; Notable for the following:    Sodium 120 (*)    Chloride 80 (*)    Glucose, Bld 219 (*)    ALT 13 (*)    Total Bilirubin 1.5 (*)    All other components within normal limits  CBC - Abnormal; Notable for the following:    MCHC 37.3 (*)    All other components within normal limits  URINALYSIS, ROUTINE W REFLEX MICROSCOPIC (NOT AT [facility]) - Abnormal; Notable for the following:    APPearance CLOUDY (*)    Glucose, UA 500 (*)    Ketones, ur 40 (*)    Protein, ur 30 (*)    All other components within normal limits  URINE MICROSCOPIC-ADD ON  LIPASE, BLOOD  I-STAT TROPOININ, ED    Imaging Review Dg Chest 2 View  05/01/2015   CLINICAL DATA:  Shortness of breath.  EXAM: CHEST  2 VIEW  COMPARISON:  02/11/2010 chest radiograph  FINDINGS: Stable cardiomediastinal silhouette with normal heart size. No pneumothorax. No pleural effusion. Clear lungs, with no focal lung consolidation and no pulmonary edema. Stable right upper quadrant  cholecystectomy clips.  IMPRESSION: No active disease in the chest.   Electronically Signed   By: Delbert Phenix M.D.   On: 05/01/2015 14:50   I have personally reviewed and evaluated these images and lab results as part of my medical decision-making.   EKG Interpretation   Date/Time:  Sunday May 01 2015 13:41:53 EDT Ventricular Rate:  68 PR Interval:    QRS Duration: 89 QT Interval:  442 QTC Calculation: 470 R Axis:   52 Text Interpretation:  Atrial fibrillation Suspect  not A fib but artifact.  Confirmed by Deretha Emory  MD, SCOTT [phone]) on 05/01/2015 3:16:18 PM      MDM   Final diagnoses:  Hyponatremia  Non-intractable vomiting with nausea, vomiting of unspecified type  Diarrhea, unspecified type    Patient presents with 3 day history of N/V/D and generalized  weakness.  VSS, NAD, patient appears nontoxic.  Abdomen nontender, nondistended.  Lungs CTAB.  Heart RRR. No focal neurological deficits on exam. Will start IVF and zofran.  Labs ordered.   -Troponin negative -Na 120; likely due to volume depletion given 2 day history of vomiting and diarrhea -CXR unremarkable -UA shows ketones, protein, and glucose likely due to decreased PO intake and N/V/D.  Will continue maintenance fluids for hyponatremia and admit to telemetry.  Case has been discussed with and seen by Dr. Criss Alvine who agrees with the above plan for admission.     Cheri Fowler, PA-C 05/01/15 1700  Pricilla Loveless, MD 05/01/15 [phone]

## 2015-05-01 NOTE — H&P (Addendum)
[facility] History and Physical  Kristina [name] ZOX:[id number] DOB: 07/11/1946 DOA: 05/01/2015  Referring physician: * PCP: Hollice Espy, MD   Chief Complaint: Nausea vomiting diarrhea  HPI:  70 year old female who has not been feeling well since Thursday. She initially started having checkups on Thursday followed by 2-3 days of nausea and vomiting 2-3 times a day. Patient started developing some abdominal cramping and diarrhea for 2-3 bowel movements since this morning. Patient denied any significant abdominal pain, no melena nor hematochezia. No chest pain or shortness of breath. She denied any fever. She denied any sick contacts. No recent history of traveling. No recent antibiotics. In the ER the patient was found to have a sodium of 120. Chest x-ray negative. White count negative. Her asthma has been under good control      Review of Systems: negative for the following  Constitutional: Denies fever, chills, diaphoresis, appetite change and fatigue.  HEENT: Denies photophobia, eye pain, redness, hearing loss, ear pain, congestion, sore throat, rhinorrhea, sneezing, mouth sores, trouble swallowing, neck pain, neck stiffness and tinnitus.  Respiratory: Denies SOB, DOE, cough, chest tightness, and wheezing.  Cardiovascular: Denies chest pain, palpitations and leg swelling.  Gastrointestinal: Positive for nausea, vomiting, and diarrhea, abdominal pain,, constipation, blood in stool and abdominal distention.  Genitourinary: Denies dysuria, urgency, frequency, hematuria, flank pain and difficulty urinating.  Musculoskeletal: Denies myalgias, back pain, joint swelling, arthralgias and gait problem.  Skin: Denies pallor, rash and wound.  Neurological: Denies dizziness, seizures, syncope, weakness, light-headedness, numbness and headaches.  Hematological: Denies adenopathy. Easy bruising, personal or family bleeding history  Psychiatric/Behavioral: Denies suicidal ideation, mood  changes, confusion, nervousness, sleep disturbance and agitation       Past Medical History  Diagnosis Date  . Asthma   . Diabetes mellitus without complication (HCC)   . Hypertension   . Thyroid disease   . GERD (gastroesophageal reflux disease)   . Hyperlipidemia      Past Surgical History  Procedure Laterality Date  . Hammertoe    . Cholecystectomy    . Appendectomy        Social History:  reports that she has been smoking.  She does not have any smokeless tobacco history on file. She reports that she does not drink alcohol or use illicit drugs.    Allergies  Allergen Reactions  . Ciprofloxacin Other (See Comments)    Tendinitis in heel and posterior right LE  . Doxycycline Other (See Comments)    Pt does not remember what the reaction was - many years ago  . Shrimp [Shellfish Allergy] Diarrhea, Nausea And Vomiting and Rash        FAMILY HISTORY  When questioned  Directly-patient reports  No family history of HTN, CVA ,DIABETES, TB, Cancer CAD, Bleeding Disorders, Sickle Cell, diabetes, anemia, asthma,   Prior to Admission medications   Medication Sig Start Date End Date Taking? Authorizing Provider  acetaminophen (TYLENOL) 500 MG tablet Take 1,000 mg by mouth 2 (two) times daily as needed (pain).   Yes Historical Provider, MD  amLODipine (NORVASC) 5 MG tablet Take 5 mg by mouth daily.   Yes Historical Provider, MD  aspirin EC 81 MG tablet Take 81 mg by mouth at bedtime.   Yes Historical Provider, MD  beclomethasone (QVAR) 80 MCG/ACT inhaler Inhale 2 puffs into the lungs every 12 (twelve) hours.   Yes Historical Provider, MD  CALCIUM PO Take 500 mg by mouth at bedtime.   Yes Historical Provider, MD  Dextromethorphan-Guaifenesin (  MUCINEX DM MAXIMUM STRENGTH) 60-1200 MG TB12 Take 1 tablet by mouth every 12 (twelve) hours as needed (cough).   Yes Historical Provider, MD  famotidine (PEPCID) 20 MG tablet Take 20 mg by mouth 2 (two) times daily.   Yes Historical  Provider, MD  hydrochlorothiazide (HYDRODIURIL) 25 MG tablet Take 25 mg by mouth daily.   Yes Historical Provider, MD  insulin glargine (LANTUS) 100 unit/mL SOPN Inject 14 Units into the skin at bedtime.   Yes Historical Provider, MD  levothyroxine (SYNTHROID, LEVOTHROID) 25 MCG tablet Take 25 mcg by mouth daily before breakfast.   Yes Historical Provider, MD  metFORMIN (GLUCOPHAGE) 500 MG tablet Take 2,000 mg by mouth at bedtime.   Yes Historical Provider, MD  metroNIDAZOLE (METROCREAM) 0.75 % cream Apply 1 application topically daily as needed (rosacea).   Yes Historical Provider, MD  montelukast (SINGULAIR) 10 MG tablet Take 10 mg by mouth at bedtime.   Yes Historical Provider, MD  nebivolol (BYSTOLIC) 10 MG tablet Take 10 mg by mouth at bedtime.   Yes Historical Provider, MD  simvastatin (ZOCOR) 20 MG tablet Take 20 mg by mouth at bedtime.   Yes Historical Provider, MD  traMADol (ULTRAM) 50 MG tablet Take 50 mg by mouth daily as needed (pain).   Yes Historical Provider, MD     Physical Exam: Filed Vitals:   05/01/15 1400 05/01/15 1450 05/01/15 1600  BP: 153/65 170/105 145/66  Pulse: 62 71 65  Temp:  98.7 F (37.1 C)   TempSrc:  Oral   Resp: Height:   (1.626 m)   Weight:  78.472 kg (173 lb)   SpO2: 98% 100% 100%     Constitutional: Vital signs reviewed. Patient is a well-developed and well-nourished in no acute distress and cooperative with exam. Alert and oriented x3.  Head: Normocephalic and atraumatic  Ear: TM normal bilaterally  Mouth: no erythema or exudates, dry mucous membranes Eyes: PERRL, EOMI, conjunctivae normal, No scleral icterus.  Neck: Supple, Trachea midline normal ROM, No JVD, mass, thyromegaly, or carotid bruit present.  Cardiovascular: RRR, S1 normal, S2 normal, no MRG, pulses symmetric and intact bilaterally  Pulmonary/Chest: CTAB, no wheezes, rales, or rhonchi  Abdominal: Soft. Non-tender, non-distended, bowel sounds are normal, no masses,  organomegaly, or guarding present.  GU: no CVA tenderness Musculoskeletal: No joint deformities, erythema, or stiffness, ROM full and no nontender Ext: no edema and no cyanosis, pulses palpable bilaterally (DP and PT)  Hematology: no cervical, inginal, or axillary adenopathy.  Neurological: A&O x3, Strenght is normal and symmetric bilaterally, cranial nerve II-XII are grossly intact, no focal motor deficit, sensory intact to light touch bilaterally.  Skin: Warm, dry and intact. No rash, cyanosis, or clubbing.  Psychiatric: Normal mood and affect. speech and behavior is normal. Judgment and thought content normal. Cognition and memory are normal.      Data Review   Micro Results No results found for this or any previous visit (from the past 240 hour(s)).  Radiology Reports Dg Chest 2 View  05/01/2015   CLINICAL DATA:  Shortness of breath.  EXAM: CHEST  2 VIEW  COMPARISON:  02/11/2010 chest radiograph  FINDINGS: Stable cardiomediastinal silhouette with normal heart size. No pneumothorax. No pleural effusion. Clear lungs, with no focal lung consolidation and no pulmonary edema. Stable right upper quadrant cholecystectomy clips.  IMPRESSION: No active disease in the chest.   Electronically Signed   By: Delbert Phenix M.D.   On: 05/01/2015 14:50  CBC  Recent Labs Lab 05/01/15 1410  WBC 7.3  HGB 15.0  HCT 40.2  PLT 275  MCV 87.2  MCH 32.5  MCHC 37.3*  RDW 12.2    Chemistries   Recent Labs Lab 05/01/15 1410  NA 120*  K 3.5  CL 80*  CO2 25  GLUCOSE 219*  BUN 9  CREATININE 0.76  CALCIUM 9.7  AST 29  ALT 13*  ALKPHOS 62  BILITOT 1.5*   ------------------------------------------------------------------------------------------------------------------ estimated creatinine clearance is 66.3 mL/min (by C-G formula based on Cr of 0.76). ------------------------------------------------------------------------------------------------------------------ No results for input(s):  HGBA1C in the last 72 hours. ------------------------------------------------------------------------------------------------------------------ No results for input(s): CHOL, HDL, LDLCALC, TRIG, CHOLHDL, LDLDIRECT in the last 72 hours. ------------------------------------------------------------------------------------------------------------------ No results for input(s): TSH, T4TOTAL, T3FREE, THYROIDAB in the last 72 hours.  Invalid input(s): FREET3 ------------------------------------------------------------------------------------------------------------------ No results for input(s): VITAMINB12, FOLATE, FERRITIN, TIBC, IRON, RETICCTPCT in the last 72 hours.  Coagulation profile No results for input(s): INR, PROTIME in the last 168 hours.  No results for input(s): DDIMER in the last 72 hours.  Cardiac Enzymes No results for input(s): CKMB, TROPONINI, MYOGLOBIN in the last 168 hours.  Invalid input(s): CK ------------------------------------------------------------------------------------------------------------------ Invalid input(s): POCBNP   CBG: No results for input(s): GLUCAP in the last 168 hours.     EKG: Independently reviewed.     Assessment/Plan Principal Problem:   Acute hyponatremia-likely secondary to dehydration, HCTZ, some dilution secondary to hyperglycemia. Will check , TSH,  start the patient on aggressive IV hydration with normal saline which hopefully would resolve her hyponatremia.  :   Gastroenteritis, acute-will check a GI pathogen panel, stool culture, continue IV hydration    Diabetes mellitus type 2, controlled (HCC)-hold oral hypoglycemics, continue Lantus    Asthma in remission-fairly well controlled at this time, chest x-ray negative  Hypothyroidism-continue Synthroid and check TSH  Code Status:   full Family Communication: bedside Disposition Plan: admit   Total time spent 55 minutes.Greater than 50% of this time was spent in  counseling, explanation of diagnosis, planning of further management, and coordination of care  [facility] Pager [phone]  If 7PM-7AM, please contact night-coverage [email or web address] Password [email or web address] 05/01/2015, 5:08 PM

## 2015-05-01 NOTE — ED Notes (Signed)
CLEAR LIQUID TRAY ORDERED

## 2015-05-01 NOTE — ED Notes (Addendum)
To room via EMS.  Pt reports onset 04-28-15 nausea and hiccups.  Hiccups would last at times 30 mins.  Onset  04-29-15 vomiting x 2, x 2 yesterday and x 2 today.  Onset 10am diarrhea x 2-loose to watery, light yellow.  Pt reports has had decreased PO fluid/food intake x 3 days.  PTA pt has had facial flushing.  A&Ox4, resp e/u.

## 2015-05-02 DIAGNOSIS — J45998 Other asthma: Secondary | ICD-10-CM

## 2015-05-02 DIAGNOSIS — E871 Hypo-osmolality and hyponatremia: Principal | ICD-10-CM

## 2015-05-02 DIAGNOSIS — K529 Noninfective gastroenteritis and colitis, unspecified: Secondary | ICD-10-CM

## 2015-05-02 LAB — COMPREHENSIVE METABOLIC PANEL
ALT: 11 U/L — ABNORMAL LOW (ref 14–54)
ANION GAP: 7 (ref 5–15)
AST: 17 U/L (ref 15–41)
Albumin: 3.4 g/dL — ABNORMAL LOW (ref 3.5–5.0)
Alkaline Phosphatase: 49 U/L (ref 38–126)
BUN: 8 mg/dL (ref 6–20)
CALCIUM: 9.2 mg/dL (ref 8.9–10.3)
CHLORIDE: 94 mmol/L — AB (ref 101–111)
CO2: 30 mmol/L (ref 22–32)
Creatinine, Ser: 0.68 mg/dL (ref 0.44–1.00)
GFR calc non Af Amer: >60 mL/min (ref >60)
Glucose, Bld: 105 mg/dL — ABNORMAL HIGH (ref 65–99)
POTASSIUM: 3.2 mmol/L — AB (ref 3.5–5.1)
SODIUM: 131 mmol/L — AB (ref 135–145)
Total Bilirubin: 0.8 mg/dL (ref 0.3–1.2)
Total Protein: 5.8 g/dL — ABNORMAL LOW (ref 6.5–8.1)

## 2015-05-02 LAB — TROPONIN I

## 2015-05-02 LAB — CBC
HCT: 35.6 % — ABNORMAL LOW (ref 36.0–46.0)
HEMOGLOBIN: 13 g/dL (ref 12.0–15.0)
MCH: 32.4 pg (ref 26.0–34.0)
MCHC: 36.5 g/dL — ABNORMAL HIGH (ref 30.0–36.0)
MCV: 88.8 fL (ref 78.0–100.0)
Platelets: 220 10*3/uL (ref 150–400)
RBC: 4.01 MIL/uL (ref 3.87–5.11)
RDW: 12.6 % (ref 11.5–15.5)
WBC: 5.2 10*3/uL (ref 4.0–10.5)

## 2015-05-02 LAB — GLUCOSE, CAPILLARY
Glucose-Capillary: 187 mg/dL — ABNORMAL HIGH (ref 65–99)
Glucose-Capillary: 95 mg/dL (ref 65–99)

## 2015-05-02 LAB — HEMOGLOBIN A1C
Hgb A1c MFr Bld: 6.9 % — ABNORMAL HIGH (ref 4.8–5.6)
Mean Plasma Glucose: 151 mg/dL

## 2015-05-02 MED ORDER — AMLODIPINE BESYLATE 5 MG PO TABS
5.0000 mg | ORAL_TABLET | Freq: Every day | ORAL | Status: DC
  Administered 2015-05-02: 5 mg via ORAL
  Filled 2015-05-02: qty 1

## 2015-05-02 MED ORDER — SACCHAROMYCES BOULARDII 250 MG PO CAPS
250.0000 mg | ORAL_CAPSULE | Freq: Two times a day (BID) | ORAL | Status: DC
  Administered 2015-05-02: 250 mg via ORAL

## 2015-05-02 MED ORDER — SACCHAROMYCES BOULARDII 250 MG PO CAPS: 250.0000 mg | ORAL_CAPSULE | Freq: Two times a day (BID) | ORAL | Status: DC

## 2015-05-02 MED ORDER — MAGNESIUM SULFATE 2 GM/50ML IV SOLN
2.0000 g | Freq: Once | INTRAVENOUS | Status: AC
  Administered 2015-05-02: 2 g via INTRAVENOUS
  Filled 2015-05-02: qty 50

## 2015-05-02 NOTE — Progress Notes (Signed)
Discharge instructions given and patient verbalized understanding. 

## 2015-05-02 NOTE — Discharge Summary (Signed)
Physician Discharge Summary  OKLA QAZI EAV:[id number] DOB: 01-01-1946 DOA: 05/01/2015  PCP: Hollice Espy, MD  Admit date: 05/01/2015 Discharge date: 05/02/2015  Time spent: 20 minutes  Recommendations for Outpatient Follow-up:  Follow up with PCP in 1-2 weeks  Discharge Diagnoses:  Principal Problem:   Acute hyponatremia Active Problems:   Gastroenteritis, acute   Diabetes mellitus type 2, controlled (HCC)   Asthma in remission   Discharge Condition: Improved  Diet recommendation: diabetic  Filed Weights   05/01/15 1450 05/01/15 1824  Weight: 78.472 kg (173 lb) 77.111 kg (170 lb)    History of present illness:  Please see admit h and p from 10/2 for details. Briefly, patient presented to the ED with 3 day hx of n/v/d with decreased PO intake that began shortly after visiting a nursing home. Patient was found to be hyponatremic. Pt was admitted for further work up.  Hospital Course:  The patient was admitted to the floor. The patient was continued on IVF hydration overnight with improvement in serum sodium. The patient also reported feeling back to baseline by the following morning and was able to tolerate regular PO without difficulty. The patient remained afebrile and with no leukocytosis. Given concerns of possible infectious gastroenteritis, the patient was advised to continue with frequent hand-washing.   Discharge Exam: Filed Vitals:   05/01/15 1824 05/01/15 2100 05/02/15 0500 05/02/15 0922  BP: 147/62 119/54 157/65 110/45  Pulse: 66 65 53 58  Temp: 99.2 F (37.3 C) 98.4 F (36.9 C) 98.2 F (36.8 C) 98.4 F (36.9 C)  TempSrc: Oral Oral Oral Oral  Resp: Height:  (1.626 m)     Weight: 77.111 kg (170 lb)     SpO2: 99% 95% 100% 100%    General: Awake, in nad Cardiovascular: regular, s1, s2 Respiratory: normal resp effort, no wheezing  Discharge Instructions     Medication List    STOP taking these medications        hydrochlorothiazide 25 MG tablet  Commonly known as:  HYDRODIURIL     metroNIDAZOLE 0.75 % cream  Commonly known as:  METROCREAM      TAKE these medications        acetaminophen 500 MG tablet  Commonly known as:  TYLENOL  Take 1,000 mg by mouth 2 (two) times daily as needed (pain).     amLODipine 5 MG tablet  Commonly known as:  NORVASC  Take 5 mg by mouth daily.     aspirin EC 81 MG tablet  Take 81 mg by mouth at bedtime.     beclomethasone 80 MCG/ACT inhaler  Commonly known as:  QVAR  Inhale 2 puffs into the lungs every 12 (twelve) hours.     CALCIUM PO  Take 500 mg by mouth at bedtime.     famotidine 20 MG tablet  Commonly known as:  PEPCID  Take 20 mg by mouth 2 (two) times daily.     insulin glargine 100 unit/mL Sopn  Commonly known as:  LANTUS  Inject 14 Units into the skin at bedtime.     levothyroxine 25 MCG tablet  Commonly known as:  SYNTHROID, LEVOTHROID  Take 25 mcg by mouth daily before breakfast.     metFORMIN 500 MG tablet  Commonly known as:  GLUCOPHAGE  Take 2,000 mg by mouth at bedtime.     montelukast 10 MG tablet  Commonly known as:  SINGULAIR  Take 10 mg by mouth at bedtime.  MUCINEX DM MAXIMUM STRENGTH 60-1200 MG Tb12  Take 1 tablet by mouth every 12 (twelve) hours as needed (cough).     nebivolol 10 MG tablet  Commonly known as:  BYSTOLIC  Take 10 mg by mouth at bedtime.     saccharomyces boulardii 250 MG capsule  Commonly known as:  FLORASTOR  Take 1 capsule (250 mg total) by mouth 2 (two) times daily.     simvastatin 20 MG tablet  Commonly known as:  ZOCOR  Take 20 mg by mouth at bedtime.     traMADol 50 MG tablet  Commonly known as:  ULTRAM  Take 50 mg by mouth daily as needed (pain).       Allergies  Allergen Reactions  . Ciprofloxacin Other (See Comments)    Tendinitis in heel and posterior right LE  . Doxycycline Other (See Comments)    Pt does not remember what the reaction was - many years ago  . Shrimp  [Shellfish Allergy] Diarrhea, Nausea And Vomiting and Rash   Follow-up Information    Follow up with Hollice Espy, MD. Schedule an appointment as soon as possible for a visit in 1 week.   Specialty:  Family Medicine   Why:  Hospital follow up   Contact information:   [address] [place] [address] [phone]        The results of significant diagnostics from this hospitalization (including imaging, microbiology, ancillary and laboratory) are listed below for reference.    Significant Diagnostic Studies: Dg Chest 2 View  05/01/2015   CLINICAL DATA:  Shortness of breath.  EXAM: CHEST  2 VIEW  COMPARISON:  02/11/2010 chest radiograph  FINDINGS: Stable cardiomediastinal silhouette with normal heart size. No pneumothorax. No pleural effusion. Clear lungs, with no focal lung consolidation and no pulmonary edema. Stable right upper quadrant cholecystectomy clips.  IMPRESSION: No active disease in the chest.   Electronically Signed   By: Delbert Phenix M.D.   On: 05/01/2015 14:50    Microbiology: No results found for this or any previous visit (from the past 240 hour(s)).   Labs: Basic Metabolic Panel:  Recent Labs Lab 05/01/15 1410 05/01/15 1748 05/02/15 0605  NA 120*  --  131*  K 3.5  --  3.2*  CL 80*  --  94*  CO2 25  --  30  GLUCOSE 219*  --  105*  BUN 9  --  8  CREATININE 0.76 0.61 0.68  CALCIUM 9.7  --  9.2  MG  --  1.3*  --    Liver Function Tests:  Recent Labs Lab 05/01/15 1410 05/02/15 0605  AST 29 17  ALT 13* 11*  ALKPHOS 62 49  BILITOT 1.5* 0.8  PROT 7.1 5.8*  ALBUMIN 4.2 3.4*    Recent Labs Lab 05/01/15 1410 05/01/15 1748  LIPASE 12* 12*   No results for input(s): AMMONIA in the last 168 hours. CBC:  Recent Labs Lab 05/01/15 1410 05/01/15 1748 05/02/15 0605  WBC 7.3 6.9 5.2  HGB 15.0 14.7 13.0  HCT 40.2 39.7 35.6*  MCV 87.2 87.6 88.8  PLT 275 261 220   Cardiac Enzymes:  Recent Labs Lab 05/01/15 1748  05/01/15 2238 05/02/15 0605  TROPONINI <0.03 <0.03 <0.03   BNP: BNP (last 3 results) No results for input(s): BNP in the last 8760 hours.  ProBNP (last 3 results) No results for input(s): PROBNP in the last 8760 hours.  CBG:  Recent Labs Lab 05/01/15 2223 05/02/15  1884 05/02/15 1144  GLUCAP 213* 187* 95     Signed:  Noemi Ishmael K  [facility] 05/02/2015, 5:38 PM

## 2015-05-02 NOTE — Discharge Instructions (Signed)
Hold Aspirin for one week

## 2015-05-06 ENCOUNTER — Encounter (HOSPITAL_COMMUNITY): Payer: Self-pay

## 2015-05-17 ENCOUNTER — Other Ambulatory Visit: Payer: Self-pay | Admitting: Internal Medicine

## 2015-05-17 MED ORDER — MONTELUKAST SODIUM 10 MG PO TABS: ORAL_TABLET | ORAL | Status: DC

## 2015-05-19 ENCOUNTER — Other Ambulatory Visit: Payer: Self-pay | Admitting: Cardiovascular Disease

## 2015-05-31 ENCOUNTER — Encounter: Payer: Self-pay | Admitting: Internal Medicine

## 2015-05-31 ENCOUNTER — Ambulatory Visit (INDEPENDENT_AMBULATORY_CARE_PROVIDER_SITE_OTHER): Payer: Medicare Other | Admitting: Internal Medicine

## 2015-05-31 VITALS — BP 142/70 | HR 71 | Ht 64.0 in | Wt 177.6 lb

## 2015-05-31 DIAGNOSIS — I10 Essential (primary) hypertension: Secondary | ICD-10-CM

## 2015-05-31 DIAGNOSIS — R05 Cough: Secondary | ICD-10-CM | POA: Diagnosis not present

## 2015-05-31 DIAGNOSIS — J45991 Cough variant asthma: Secondary | ICD-10-CM

## 2015-05-31 DIAGNOSIS — R058 Other specified cough: Secondary | ICD-10-CM

## 2015-05-31 NOTE — Assessment & Plan Note (Signed)
-   off acei since early May 2016 - dc cozar 01/07/2015    CT sinus 01/26/2015  >>Normal CT of the paranasal sinuses. - GI eval by Dorena CookeyJohn Hayes 01/26/15 > did not feel gerd an issue/ did not rec any w/u for this  01/21/15  Allergy profile  Eos 0.3   RAST neg , IgE 55  -  02/18/2015 added singulair 10 mg daily > try off 05/31/2015  -  qvar 80 2bid 03/08/15 > improved 04/19/2015 > try one bid 05/31/2015 >>>   Resolved for now so ok off singulair but continue pepcid bid for now

## 2015-05-31 NOTE — Patient Instructions (Signed)
Stop singular  Try qvar 80 one twice daily   No change on pepcid or diet for now  Please schedule a follow up visit in 2 months but call sooner if needed

## 2015-05-31 NOTE — Progress Notes (Signed)
Subjective:    Patient ID: Kristina [name], female    DOB: 05/15/1946   MRN: [id number]   Brief patient profile:  7 yowf never smoked never resp problems new onset cough Jan 2016  assoc with abrupt uri while on acei "the worst cough ever" referred to pulmonary clinic 01/07/2015  by Dr Kevan Ny p d/c acei x May 2016 and still coughing.    History of Present Illness  01/07/2015 1st [facility] office visit/ Kristina [name]   Chief Complaint  Patient presents with  . Pulmonary Consult    Referred by Dr. Shaune [name]. Pt c/o cough for the past 6 months.  Cough is prod with minimal yellow to clear sputum. She has noticed cough can be triggered by laughing, bending over, and with exertion.   acute onset cough Jan 2016  with fever / dry cough / fatigue / unable to sing due to hoarseness rx with cough syrup/zpak and fever resolved but cough never did ever  since and acei stopped early May 2016  No better. Cough is more day than night and never wakes her up in am or even bothers her for the first few hours of the day but is then present for the rest of the day every day rec Stop cozar and start bystolic  daily x 2 weeks  Prednisone 10 mg take  4 each am x 2 days,   2 each am x 2 days,  1 each am x 2 days and stop  Increase prilosec to 40 mg  Take  30-60 min before first meal of the day and  Add over the counter Pepcid (famotidine)  20 mg one @  bedtime until return to office - this is the best way to tell whether stomach acid is contributing to your problem.   GERD diet  Please schedule a follow up office visit in 2 weeks, sooner if needed to see Kristina [name] to recheck bp and start process Add if not better rec trial of singulair/ allergy profile/ sinus ct then f/u with me for ? MCT    01/21/2015 [name]  Patient returns for a two-week follow-up He was seen last visit for pulmonary consult for cough that has been present since January 2016. Blood pressure has been well controlled. She was also  given a prednisone taper, which she says has helped her cough better since she finished her prednisone. Her cough is starting to slowly return. She does have postnasal drainage. She also has intermittent yellow mucus. Chest x-ray last visit showed clear lungs. Patient is a never smoker She denies any hemoptysis, orthopnea, PND or leg swelling. Is not currently using any medications for cough or drainage. >>CT sinus > neg  rec Continue on Bystolic  daily .  Continue on Omeprazole  daily in am before meal  Continue on Pepcid  At bedtime    Labs today , CBC with diff, RAST /IgE.  Mucinex Twice daily  As needed  Cough/congestion  Delsym 2 tsp Twice daily  As needed  Cough .  Begin Zyrtec  At bedtime    02/16/2015 Acute OV / [name]  Pt presented to office for an acute work in visit.  She had PFT this am that was normal with no airflow obstruction/restriction , +BD response Normal DLCO .  Right after PFT she became very weak, near syncope .  EMS was called for emergency transport.  She was brought back to exam room.  Complained of chest pressure ,  tightness, and arm tingling.  Speech became irregular and slightly slurred.  BP ~170. HR 80. Sats 100% on RA.  Blood sugar 158.  She was placed on 2l/m [place] O2 . IV was placed with NS IVF.  Placed on continuous heart monitor.  Dr. Sherene [name]  Aware and in room with pt exam.  Pt did improve with normal speech on arrival of EMS.  She remained alert to person and surroundings.  EMS report given and pt [facility].> dx hyperventilation syndrome   02/18/2015 [name]  Chief Complaint  Patient presents with  . Follow-up    PFT done 02/16/15. Pt states her cough has been worse for the past 10 days. She states had some SOB walking to the end of her driveway and back yesterday.   voice is better and can now sing but laughing makes her cough   Her usual walk up the driveway made her sob at top of drive x 50 fts x  one day prior to OV  Which is new  Am mucus is slt yellow / then minimal and no more color the rest of the day  rec Please call me with the name of the first inhaler that worked and the second that didn't > never called  Be sure my list is correct Prednisone 10 mg take  4 each am x 2 days,   2 each am x 2 days,  1 each am x 2 days and stop  Add singulair (montelukast) 10 mg each pm until return  No other changes in meds - continue pepcid ac 20 mg after bfast and at bedtime         03/08/2015 Follow up : Chronic cough  Pt presents for a 2 week follow up .  Pt completed Pred taper and cough has returned.  Did feel better on steroids. Cough came back off steroids and now has thick mucus.  Pt c/o prod cough with yellow/green mucus. Denies any chest congestion/tightness, wheezing or SOB.  Started on singulair last ov  Previous CT sinus and cxr with no acute process.  Does have drainage in throat .  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  TSH was slightly elevated last ov, started on synthroid by PCP .  rec Augmentin 875mg  Twice daily  .  Begin QVAR 80 2 puffs Twice daily  , rinse after use.  Mucinex Twice daily  As needed  Cough/congestion  Delsym 2 tsp Twice daily  As needed  Cough .  Begin Zyrtec 10mg  At bedtime  .  Voice rest , no singing for now.  Follow up Dr. Sherene [name]  In 6 weeks and As needed      04/19/2015 [name] re:  Coughs about half way through exercise  Chief Complaint  Patient presents with  . Follow-up    Pt states that her cough has improved some over the past few days. She states that on days when she exercises she coughs up more yellow sputum.     No noct or early am cough Good extol On qvar 80 2bid though technique poor rec Continue qvar 80 .Take 2 puffs first thing in am and then another 2 puffs about 12 hours later.  Work on inhaler technique: For cough try mucinex dm 1200mg  every 12 hours as needed and if still having coughing fits we can get you  a flutter valve next time  defer all GI issues to Dr Jonny RuizJohn  Madilyn [name]    05/31/2015  [name] resolved/ doe resolved on qvar 80 2bid with improved hfa / on pepcid bid and still on singulair  Chief Complaint  Patient presents with  . Follow-up    Pt states that her cough has resolved. She has had no mucus production.     Not limited by breathing from desired activities  - very satisfied  No obvious day to day or daytime variability or assoc cough or  cp or chest tightness, subjective wheeze or overt sinus or hb symptoms. No unusual exp hx or h/o childhood pna/ asthma or knowledge of premature birth.  Sleeping ok without nocturnal  or early am exacerbation  of respiratory  c/o's or need for noct saba. Also denies any obvious fluctuation of symptoms with weather or environmental changes or other aggravating or alleviating factors except as outlined above   Current Medications, Allergies, Complete Past Medical History, Past Surgical History, Family History, and Social History were reviewed in [employer] record.  ROS  The following are not active complaints unless bolded sore throat, dysphagia, dental problems, itching, sneezing,  nasal congestion or excess/ purulent secretions, ear ache,   fever, chills, sweats, unintended wt loss, classically pleuritic or exertional cp, hemoptysis,  orthopnea pnd or leg swelling, presyncope, palpitations, abdominal pain, anorexia, nausea, vomiting, diarrhea  or change in bowel with freq loose stool  or bladder habits, change in stools or urine, dysuria,hematuria,  rash, arthralgias, visual complaints, headache, numbness, weakness or ataxia or problems with walking or coordination,  change in mood/affect or memory.               Objective:   Physical Exam   amb wf nad   05/31/2015        178  Wt Readings from Last 3 Encounters:  04/19/15 179 lb (81.194 kg)  03/08/15 174 lb (78.926 kg)  02/18/15 175 lb 6.4 oz  (79.561 kg)    Vital signs reviewed      HEENT: nl dentition, turbinates, and orophanx. Nl external ear canals without cough reflex   NECK :  without JVD/Nodes/TM/ nl carotid upstrokes bilaterally   LUNGS: no acc muscle use,  CTA bilaterally   CV:  RRR  no s3 or murmur or increase in P2, no edema   ABD:  soft and nontender with nl excursion in the supine position. No bruits or organomegaly, bowel sounds nl  MS:  warm without deformities, calf tenderness, cyanosis or clubbing  SKIN: warm and dry without lesions    NEURO:  Alert x 3 .    I personally reviewed images and agree with radiology impression as follows:  CXR:  01/07/15 There is no active cardiopulmonary disease.            Assessment & Plan:   Outpatient Encounter Prescriptions as of 05/31/2015  Medication Sig  . acetaminophen (TYLENOL) 500 MG tablet Take 1,000 mg by mouth 2 (two) times daily as needed (pain).  Marland Kitchen amLODipine (NORVASC) 5 MG tablet TAKE 1 TABLET (5 MG TOTAL) BY MOUTH DAILY.  Marland Kitchen amLODipine (NORVASC) 5 MG tablet TAKE 1 TABLET (5 MG TOTAL) BY MOUTH DAILY.  Marland Kitchen aspirin 81 MG tablet Take 81 mg by mouth daily.  . beclomethasone (QVAR) 80 MCG/ACT inhaler Inhale 2 puffs into the lungs 2 (two) times daily.  Marland Kitchen CALCIUM PO Take 500 mg by mouth at bedtime.  Marland Kitchen Dextromethorphan-Guaifenesin (MUCINEX DM MAXIMUM STRENGTH) 60-1200 MG TB12 Take 1 tablet by mouth  every 12 (twelve) hours as needed (cough).  . famotidine (PEPCID) 20 MG tablet One after breakdfast and One at bedtime  . insulin glargine (LANTUS) 100 unit/mL SOPN Inject 14 Units into the skin at bedtime.  Marland Kitchen levothyroxine (SYNTHROID, LEVOTHROID) 25 MCG tablet Take 25 mcg by mouth daily before breakfast.  . metFORMIN (GLUCOPHAGE) 500 MG tablet Take 2,000 mg by mouth at bedtime.  . metroNIDAZOLE (METROCREAM) 0.75 % cream Apply topically 2 (two) times daily.  . nebivolol (BYSTOLIC) 10 MG tablet Take 10 mg by mouth at bedtime.  . saccharomyces boulardii  (FLORASTOR) 250 MG capsule Take 1 capsule (250 mg total) by mouth 2 (two) times daily.  . simvastatin (ZOCOR) 20 MG tablet Take 1 tablet by mouth daily.  . traMADol (ULTRAM) 50 MG tablet Take 50 mg by mouth daily as needed (pain).  . [DISCONTINUED] montelukast (SINGULAIR) 10 MG tablet Take 10 mg by mouth at bedtime.  . [DISCONTINUED] acetaminophen (TYLENOL) 500 MG tablet Take 500 mg by mouth every 6 (six) hours as needed.  . [DISCONTINUED] amLODipine (NORVASC) 5 MG tablet Take 5 mg by mouth daily.  . [DISCONTINUED] aspirin EC 81 MG tablet Take 81 mg by mouth at bedtime.  . [DISCONTINUED] beclomethasone (QVAR) 80 MCG/ACT inhaler Inhale 2 puffs into the lungs every 12 (twelve) hours.  . [DISCONTINUED] Calcium-Magnesium-Vitamin D (CALCIUM 500 PO) Take 1 tablet by mouth daily.  . [DISCONTINUED] famotidine (PEPCID) 20 MG tablet Take 20 mg by mouth 2 (two) times daily.  . [DISCONTINUED] hydrochlorothiazide (HYDRODIURIL) 25 MG tablet Take 25 mg by mouth daily.  . [DISCONTINUED] insulin glargine (LANTUS) 100 UNIT/ML injection Inject 13-14 Units into the skin at bedtime.   . [DISCONTINUED] levothyroxine (SYNTHROID, LEVOTHROID) 25 MCG tablet Take 25 mcg by mouth daily before breakfast.  . [DISCONTINUED] metFORMIN (GLUCOPHAGE) 500 MG tablet Take 2,000 mg by mouth every evening. 4 tabs po qd  . [DISCONTINUED] montelukast (SINGULAIR) 10 MG tablet One at bedtime every night  . [DISCONTINUED] nebivolol (BYSTOLIC) 10 MG tablet Take 1 tablet (10 mg total) by mouth daily.  . [DISCONTINUED] simvastatin (ZOCOR) 20 MG tablet Take 20 mg by mouth at bedtime.  . [DISCONTINUED] traMADol (ULTRAM) 50 MG tablet Take 1 tablet by mouth daily as needed.   No facility-administered encounter medications on file as of 05/31/2015.

## 2015-05-31 NOTE — Assessment & Plan Note (Signed)
-   02/16/15 pfts completely wnl   Not entirely clear this is primary asthma as could have been caused by cyclical coughing /gerd.  However, clear to me in retrospect both pred po and ICS did help so rec try qvar 80 one bid and off singulair x 2 m then regroup to taper/simplify rx further if tol  The proper method of use, as well as anticipated side effects, of a metered-dose inhaler are discussed and demonstrated to the patient. Improved effectiveness after extensive coaching during this visit to a level of approximately  90%   I had an extended discussion with the patient reviewing all relevant studies completed to date and  lasting 15 to 20 minutes of a 25 minute visit    Each maintenance medication was reviewed in detail including most importantly the difference between maintenance and prns and under what circumstances the prns are to be triggered using an action plan format that is not reflected in the computer generated alphabetically organized AVS.    Please see instructions for details which were reviewed in writing and the patient given a copy highlighting the part that I personally wrote and discussed at today's ov.

## 2015-05-31 NOTE — Assessment & Plan Note (Signed)
D/c acei early may 2016 and arb 01/07/2015 due to cough> some better 04/19/2015   In the best review of chronic cough to date (week of 05/16/15 [facility]) ,  ACEi are rated to  causing cough in 20% of pts which is a 4 fold increase from previous reports and does not include the variety of non-specific complaints we see in pulmonary clinic in pts on ACEi but previously attributed to copd/asthma to include PNDS, throat and chest congestion, "bronchitis", unexplained dyspnea and noct "strangling" sensations as well as atypical /refractory GERD symptoms like upper dysphagia.   Given the refractory nature of this cough and how well she's doing off acei would not rechallenge and have listed as an allergy.

## 2015-06-16 ENCOUNTER — Other Ambulatory Visit: Payer: Self-pay | Admitting: Cardiovascular Disease

## 2015-07-15 ENCOUNTER — Other Ambulatory Visit: Payer: Self-pay | Admitting: Adult Health

## 2015-08-03 ENCOUNTER — Ambulatory Visit: Payer: Self-pay | Admitting: Internal Medicine

## 2015-08-04 ENCOUNTER — Encounter: Payer: Self-pay | Admitting: Internal Medicine

## 2015-08-04 ENCOUNTER — Ambulatory Visit (INDEPENDENT_AMBULATORY_CARE_PROVIDER_SITE_OTHER): Payer: Medicare Other | Admitting: Internal Medicine

## 2015-08-04 VITALS — BP 134/80 | HR 65 | Ht 64.0 in | Wt 178.0 lb

## 2015-08-04 DIAGNOSIS — R05 Cough: Secondary | ICD-10-CM

## 2015-08-04 DIAGNOSIS — R058 Other specified cough: Secondary | ICD-10-CM

## 2015-08-04 NOTE — Progress Notes (Signed)
Subjective:    Patient ID: Kristina [name], female    DOB: 01-06-1946   MRN: [id number]   Brief patient profile:  72 yowf never smoked never resp problems new onset cough Jan 2016  assoc with abrupt uri while on acei "the worst cough ever" referred to pulmonary clinic 01/07/2015  by Dr Kevan Ny p d/c acei x May 2016 and still coughing.    History of Present Illness  01/07/2015 1st [facility] office visit/ Kristina [name]   Chief Complaint  Patient presents with  . Pulmonary Consult    Referred by Dr. Shaune Pollack. Pt c/o cough for the past 6 months.  Cough is prod with minimal yellow to clear sputum. She has noticed cough can be triggered by laughing, bending over, and with exertion.   acute onset cough Jan 2016  with fever / dry cough / fatigue / unable to sing due to hoarseness rx with cough syrup/zpak and fever resolved but cough never did ever  since and acei stopped early May 2016  No better. Cough is more day than night and never wakes her up in am or even bothers her for the first few hours of the day but is then present for the rest of the day every day rec Stop cozar and start bystolic  daily x 2 weeks  Prednisone 10 mg take  4 each am x 2 days,   2 each am x 2 days,  1 each am x 2 days and stop  Increase prilosec to 40 mg  Take  30-60 min before first meal of the day and  Add over the counter Pepcid (famotidine)  20 mg one @  bedtime until return to office - this is the best way to tell whether stomach acid is contributing to your problem.   GERD diet  Please schedule a follow up office visit in 2 weeks, sooner if needed to see Kristina [name] to recheck bp and start process Add if not better rec trial of singulair/ allergy profile/ sinus ct then f/u with me for ? MCT    01/21/2015 [name]  Patient returns for a two-week follow-up He was seen last visit for pulmonary consult for cough that has been present since January 2016. Blood pressure has been well controlled. She was also  given a prednisone taper, which she says has helped her cough better since she finished her prednisone. Her cough is starting to slowly return. She does have postnasal drainage. She also has intermittent yellow mucus. Chest x-ray last visit showed clear lungs. Patient is a never smoker She denies any hemoptysis, orthopnea, PND or leg swelling. Is not currently using any medications for cough or drainage. >>CT sinus > neg  rec Continue on Bystolic  daily .  Continue on Omeprazole  daily in am before meal  Continue on Pepcid  At bedtime    Labs today , CBC with diff, RAST /IgE.  Mucinex Twice daily  As needed  Cough/congestion  Delsym 2 tsp Twice daily  As needed  Cough .  Begin Zyrtec  At bedtime    02/16/2015 Acute OV / [name]  Pt presented to office for an acute work in visit.  She had PFT this am that was normal with no airflow obstruction/restriction , +BD response Normal DLCO .  Right after PFT she became very weak, near syncope .  EMS was called for emergency transport.  She was brought back to exam room.  Complained of chest pressure ,  tightness, and arm tingling.  Speech became irregular and slightly slurred.  BP ~170. HR 80. Sats 100% on RA.  Blood sugar 158.  She was placed on 2l/m [place] O2 . IV was placed with NS IVF.  Placed on continuous heart monitor.  Dr. Sherene SiresWert  Aware and in room with pt exam.  Pt did improve with normal speech on arrival of EMS.  She remained alert to person and surroundings.  EMS report given and pt transported [facility].> dx hyperventilation syndrome   02/18/2015 [name]  Chief Complaint  Patient presents with  . Follow-up    PFT done 02/16/15. Pt states her cough has been worse for the past 10 days. She states had some SOB walking to the end of her driveway and back yesterday.   voice is better and can now sing but laughing makes her cough   Her usual walk up the driveway made her sob at top of drive x 50 fts x  one day prior to OV  Which is new  Am mucus is slt yellow / then minimal and no more color the rest of the day  rec Please call me with the name of the first inhaler that worked and the second that didn't > never called  Be sure my list is correct Prednisone 10 mg take  4 each am x 2 days,   2 each am x 2 days,  1 each am x 2 days and stop  Add singulair (montelukast) 10 mg each pm until return  No other changes in meds - continue pepcid ac 20 mg after bfast and at bedtime         03/08/2015 Follow up : Chronic cough  Pt presents for a 2 week follow up .  Pt completed Pred taper and cough has returned.  Did feel better on steroids. Cough came back off steroids and now has thick mucus.  Pt c/o prod cough with yellow/green mucus. Denies any chest congestion/tightness, wheezing or SOB.  Started on singulair last ov  Previous CT sinus and cxr with no acute process.  Does have drainage in throat .  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  TSH was slightly elevated last ov, started on synthroid by PCP .  rec Augmentin 875mg  Twice daily  .  Begin QVAR 80 2 puffs Twice daily  , rinse after use.  Mucinex Twice daily  As needed  Cough/congestion  Delsym 2 tsp Twice daily  As needed  Cough .  Begin Zyrtec 10mg  At bedtime  .  Voice rest , no singing for now.  Follow up Dr. Sherene SiresWert  In 6 weeks and As needed      04/19/2015 [name] re:  Coughs about half way through exercise  Chief Complaint  Patient presents with  . Follow-up    Pt states that her cough has improved some over the past few days. She states that on days when she exercises she coughs up more yellow sputum.     No noct or early am cough Good extol On qvar 80 2bid though technique poor rec Continue qvar 80 .Take 2 puffs first thing in am and then another 2 puffs about 12 hours later.  Work on inhaler technique: For cough try mucinex dm 1200mg  every 12 hours as needed and if still having coughing fits we can get you  a flutter valve next time  defer all GI issues to Dr Jonny RuizJohn  Madilyn FiremanHayes    05/31/2015  f/u ov/Syeda Prickett re: chronic cough resolved/ doe resolved on qvar 80 2bid with improved hfa / on pepcid bid and still on singulair  Chief Complaint  Patient presents with  . Follow-up    Pt states that her cough has resolved. She has had no mucus production.   Not limited by breathing from desired activities    rec Stop singular Try qvar 80 one twice daily  No change on pepcid or diet for now    08/04/2015  f/u ov/Samera Macy re: chronic cough maint rx just qvar 80 one bid  Chief Complaint  Patient presents with  . Follow-up    Cough is still not present. She has had minimal hoarseness over the past few days.  some sense of am throat congestion first thing in am p pepcid hs    Not limited by breathing from desired activities    No obvious day to day or daytime variability or assoc cough or  cp or chest tightness, subjective wheeze or overt sinus or hb symptoms. No unusual exp hx or h/o childhood pna/ asthma or knowledge of premature birth.  Sleeping ok without nocturnal  or early am exacerbation  of respiratory  c/o's or need for noct saba. Also denies any obvious fluctuation of symptoms with weather or environmental changes or other aggravating or alleviating factors except as outlined above   Current Medications, Allergies, Complete Past Medical History, Past Surgical History, Family History, and Social History were reviewed in [facility] electronic medical record.  ROS  The following are not active complaints unless bolded sore throat, dysphagia, dental problems, itching, sneezing,  nasal congestion or excess/ purulent secretions, ear ache,   fever, chills, sweats, unintended wt loss, classically pleuritic or exertional cp, hemoptysis,  orthopnea pnd or leg swelling, presyncope, palpitations, abdominal pain, anorexia, nausea, vomiting, diarrhea  or change in bowel with freq loose stool  or bladder habits, change  in stools or urine, dysuria,hematuria,  rash, arthralgias, visual complaints, headache, numbness, weakness or ataxia or problems with walking or coordination,  change in mood/affect or memory.               Objective:   Physical Exam   amb wf nad   05/31/2015        178 > 08/04/2015   178     04/19/15 179 lb (81.194 kg)  03/08/15 174 lb (78.926 kg)  02/18/15 175 lb 6.4 oz (79.561 kg)    Vital signs reviewed      HEENT: nl dentition, turbinates, and orophanx. Nl external ear canals without cough reflex   NECK :  without JVD/Nodes/TM/ nl carotid upstrokes bilaterally   LUNGS: no acc muscle use,  CTA bilaterally   CV:  RRR  no s3 or murmur or increase in P2, no edema   ABD:  soft and nontender with nl excursion in the supine position. No bruits or organomegaly, bowel sounds nl  MS:  warm without deformities, calf tenderness, cyanosis or clubbing  SKIN: warm and dry without lesions    NEURO:  Alert x 3 .    I personally reviewed images and agree with radiology impression as follows:  CXR:  01/07/15 There is no active cardiopulmonary disease.            Assessment & Plan:    Outpatient Encounter Prescriptions as of 08/04/2015  Medication Sig  . acetaminophen (TYLENOL) 500 MG tablet Take 1,000 mg by mouth 2 (two) times daily as needed (  pain).  . amLODipine (NORVASC) 5 MG tablet TAKE 1 TABLET (5 MG TOTAL) BY MOUTH DAILY.  Marland Kitchen aspirin 81 MG tablet Take 81 mg by mouth daily.  Marland Kitchen BYSTOLIC 10 MG tablet TAKE 1 TABLET (10 MG TOTAL) BY MOUTH DAILY.  Marland Kitchen CALCIUM PO Take 500 mg by mouth at bedtime.  Marland Kitchen Dextromethorphan-Guaifenesin (MUCINEX DM MAXIMUM STRENGTH) 60-1200 MG TB12 Take 1 tablet by mouth every 12 (twelve) hours as needed (cough).  . famotidine (PEPCID) 20 MG tablet One after breakdfast and One at bedtime  . insulin glargine (LANTUS) 100 unit/mL SOPN Inject 14 Units into the skin at bedtime.  Marland Kitchen levothyroxine (SYNTHROID, LEVOTHROID) 25 MCG tablet Take 25 mcg by mouth  daily before breakfast.  . losartan (COZAAR) 50 MG tablet Take 50 mg by mouth daily.  . metFORMIN (GLUCOPHAGE) 500 MG tablet Take 2,000 mg by mouth at bedtime.  . metroNIDAZOLE (METROCREAM) 0.75 % cream Apply topically 2 (two) times daily.  . simvastatin (ZOCOR) 20 MG tablet Take 1 tablet by mouth daily.  . traMADol (ULTRAM) 50 MG tablet Take 50 mg by mouth daily as needed (pain).  . [DISCONTINUED] beclomethasone (QVAR) 80 MCG/ACT inhaler Inhale 2 puffs into the lungs 2 (two) times daily.  Marland Kitchen saccharomyces boulardii (FLORASTOR) 250 MG capsule Take 1 capsule (250 mg total) by mouth 2 (two) times daily.   No facility-administered encounter medications on file as of 08/04/2015.

## 2015-08-04 NOTE — Patient Instructions (Signed)
Ok to try off qvar - if notice any worsening start on 2 every 12 hours until better then one twice daily as a maintenance   If you are satisfied with your treatment plan,  let your doctor know and he/she can either refill your medications or you can return here when your prescription runs out.     If in any way you are not 100% satisfied,  please tell us.  If 100% better, tell your friends!  Pulmonary follow up is as needed

## 2015-08-08 ENCOUNTER — Encounter: Payer: Self-pay | Admitting: Internal Medicine

## 2015-08-08 NOTE — Assessment & Plan Note (Addendum)
-   off acei since early May 2016  -CT sinus 01/26/2015  >>Normal CT of the paranasal sinuses. - GI eval by Dorena CookeyJohn [name] 01/26/15 > did not feel gerd an issue/ did not rec any w/u for this  -  01/21/15  Allergy profile  Eos 0.3   RAST neg , IgE 55  - 02/16/15 pfts completely wnl  -  02/18/2015 added singulair 10 mg daily > try off 05/31/2015  -  qvar 80 2bid 03/08/15 > improved 04/19/2015 > try one bid 05/31/2015 > try off 08/04/2015   I had an extended final summary discussion with the patient reviewing all relevant studies completed to date and  lasting 15 to 20 minutes of a 25 minute visit on the following issues:  My strong suspicion is that this was just a prolonged "ACEi case" complicated by cyclical upper airway cough/ low grade GERD  To test the hypothesis, I rec she stop qvar completely then resume 2 bid immediately should the cough recur for any reason  Each maintenance medication was reviewed in detail including most importantly the difference between maintenance and as needed and under what circumstances the prns are to be used.  Please see instructions for details which were reviewed in writing and the patient given a copy.    Pulmonary f/u can be prn

## 2015-08-08 NOTE — Assessment & Plan Note (Signed)
Complicated by HBP/ Hyperlipidemia/ DM   Body mass index is 30.54 kg/(m^2).  Lab Results  Component Value Date   TSH 1.400 05/01/2015     Contributing to gerd tendency/ doe/reviewed the need and the process to achieve and maintain neg calorie balance > defer f/u primary care including intermittently monitoring thyroid status

## 2015-09-06 ENCOUNTER — Other Ambulatory Visit: Payer: Self-pay | Admitting: Cardiovascular Disease

## 2015-09-30 ENCOUNTER — Other Ambulatory Visit: Payer: Self-pay | Admitting: Cardiovascular Disease

## 2015-10-17 ENCOUNTER — Other Ambulatory Visit: Payer: Self-pay | Admitting: *Deleted

## 2015-10-17 MED ORDER — AMLODIPINE BESYLATE 5 MG PO TABS: ORAL_TABLET | ORAL | Status: DC

## 2015-12-06 ENCOUNTER — Ambulatory Visit: Payer: Self-pay | Admitting: Cardiovascular Disease

## 2015-12-21 ENCOUNTER — Ambulatory Visit (INDEPENDENT_AMBULATORY_CARE_PROVIDER_SITE_OTHER): Payer: Medicare Other | Admitting: Internal Medicine

## 2015-12-21 ENCOUNTER — Encounter: Payer: Self-pay | Admitting: Internal Medicine

## 2015-12-21 VITALS — BP 160/92 | HR 67 | Ht 64.0 in | Wt 178.4 lb

## 2015-12-21 DIAGNOSIS — R05 Cough: Secondary | ICD-10-CM

## 2015-12-21 DIAGNOSIS — J45991 Cough variant asthma: Secondary | ICD-10-CM | POA: Diagnosis not present

## 2015-12-21 DIAGNOSIS — I10 Essential (primary) hypertension: Secondary | ICD-10-CM

## 2015-12-21 DIAGNOSIS — R058 Other specified cough: Secondary | ICD-10-CM

## 2015-12-21 MED ORDER — MOMETASONE FURO-FORMOTEROL FUM 100-5 MCG/ACT IN AERO: INHALATION_SPRAY | RESPIRATORY_TRACT | Status: DC

## 2015-12-21 MED ORDER — AMOXICILLIN-POT CLAVULANATE 875-125 MG PO TABS
1.0000 | ORAL_TABLET | Freq: Two times a day (BID) | ORAL | Status: DC
Start: 2015-12-21 — End: 2018-03-24

## 2015-12-21 NOTE — Patient Instructions (Addendum)
Augmentin 875 mg take one pill twice daily  X 10 days - take at breakfast and supper with large glass of water.  It would help reduce the usual side effects (diarrhea and yeast infections) if you ate cultured yogurt at lunch.   For cough mucinex dm up to 1200 mg every 12 hours as needed  For any flare of cough/ wheeze/ chest tightness> immediately start back on dulera 100 Take 2 puffs first thing in am and then another 2 puffs about 12 hours later. Until back to normal for a week then taper off    If you are satisfied with your treatment plan,  let your doctor know and he/she can either refill your medications or you can return here when your prescription runs out.     If in any way you are not 100% satisfied,  please tell us.  If 100% better, tell your friends!  Pulmonary follow up is as needed

## 2015-12-21 NOTE — Progress Notes (Signed)
Subjective:    Patient ID: Kristina [name], female    DOB: 10/18/1945   MRN: [id number]   Brief patient profile:  70 yowf never smoked never resp problems new onset cough Jan 2016  assoc with abrupt uri while on acei "the worst cough ever" referred to pulmonary clinic 01/07/2015  by Kristina [name] p d/c acei x May 2016 and still coughing.    History of Present Illness  01/07/2015 1st [facility] office visit/ Kristina [name]   Chief Complaint  Patient presents with  . Pulmonary Consult    Referred by Kristina [name]. Pt c/o cough for the past 6 months.  Cough is prod with minimal yellow to clear sputum. She has noticed cough can be triggered by laughing, bending over, and with exertion.   acute onset cough Jan 2016  with fever / dry cough / fatigue / unable to sing due to hoarseness rx with cough syrup/zpak and fever resolved but cough never did ever  since and acei stopped early May 2016  No better. Cough is more day than night and never wakes her up in am or even bothers her for the first few hours of the day but is then present for the rest of the day every day rec Stop cozar and start bystolic  daily x 2 weeks  Prednisone 10 mg take  4 each am x 2 days,   2 each am x 2 days,  1 each am x 2 days and stop  Increase prilosec to 40 mg  Take  30-60 min before first meal of the day and  Add over the counter Pepcid (famotidine)  20 mg one @  bedtime until return to office - this is the best way to tell whether stomach acid is contributing to your problem.   GERD diet  Please schedule a follow up office visit in 2 weeks, sooner if needed to see Kristina [name] to recheck bp and start process Add if not better rec trial of singulair/ allergy profile/ sinus ct then f/u with me for ? MCT    01/21/2015 [name]  Patient returns for a two-week follow-up He was seen last visit for pulmonary consult for cough that has been present since January 2016. Blood pressure has been well controlled. She was also  given a prednisone taper, which she says has helped her cough better since she finished her prednisone. Her cough is starting to slowly return. She does have postnasal drainage. She also has intermittent yellow mucus. Chest x-ray last visit showed clear lungs. Patient is a never smoker She denies any hemoptysis, orthopnea, PND or leg swelling. Is not currently using any medications for cough or drainage. >>CT sinus > neg  rec Continue on Bystolic  daily .  Continue on Omeprazole  daily in am before meal  Continue on Pepcid  At bedtime    Labs today , CBC with diff, RAST /IgE.  Mucinex Twice daily  As needed  Cough/congestion  Delsym 2 tsp Twice daily  As needed  Cough .  Begin Zyrtec  At bedtime    02/16/2015 Acute OV / [name]  Pt presented to office for an acute work in visit.  She had PFT this am that was normal with no airflow obstruction/restriction , +BD response Normal DLCO .  Right after PFT she became very weak, near syncope .  EMS was called for emergency transport.  She was brought back to exam room.  Complained of chest pressure ,  tightness, and arm tingling.  Speech became irregular and slightly slurred.  BP ~170. HR 80. Sats 100% on RA.  Blood sugar 158.  She was placed on 2l/m [place] O2 . IV was placed with NS IVF.  Placed on continuous heart monitor.  Kristina [name]  Aware and in room with pt exam.  Pt did improve with normal speech on arrival of EMS.  She remained alert to person and surroundings.  EMS report given and pt transported to [facility].> dx hyperventilation syndrome   02/18/2015 [name]  Chief Complaint  Patient presents with  . Follow-up    PFT done 02/16/15. Pt states her cough has been worse for the past 10 days. She states had some SOB walking to the end of her driveway and back yesterday.   voice is better and can now sing but laughing makes her cough   Her usual walk up the driveway made her sob at top of drive x 50 fts x  one day prior to OV  Which is new  Am mucus is slt yellow / then minimal and no more color the rest of the day  rec Please call me with the name of the first inhaler that worked and the second that didn't > never called  Be sure my list is correct Prednisone 10 mg take  4 each am x 2 days,   2 each am x 2 days,  1 each am x 2 days and stop  Add singulair (montelukast) 10 mg each pm until return  No other changes in meds - continue pepcid ac 20 mg after bfast and at bedtime         03/08/2015 Follow up : Chronic cough  Pt presents for a 2 week follow up .  Pt completed Pred taper and cough has returned.  Did feel better on steroids. Cough came back off steroids and now has thick mucus.  Pt c/o prod cough with yellow/green mucus. Denies any chest congestion/tightness, wheezing or SOB.  Started on singulair last ov  Previous CT sinus and cxr with no acute process.  Does have drainage in throat .  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  TSH was slightly elevated last ov, started on synthroid by PCP .  rec Augmentin 875mg  Twice daily  .  Begin QVAR 80 2 puffs Twice daily  , rinse after use.  Mucinex Twice daily  As needed  Cough/congestion  Delsym 2 tsp Twice daily  As needed  Cough .  Begin Zyrtec 10mg  At bedtime  .  Voice rest , no singing for now.  Follow up Kristina [name]  In 6 weeks and As needed      04/19/2015 [name] re:  Coughs about half way through exercise  Chief Complaint  Patient presents with  . Follow-up    Pt states that her cough has improved some over the past few days. She states that on days when she exercises she coughs up more yellow sputum.     No noct or early am cough Good extol On qvar 80 2bid though technique poor rec Continue qvar 80 .Take 2 puffs first thing in am and then another 2 puffs about 12 hours later.  Work on inhaler technique: For cough try mucinex dm 1200mg  every 12 hours as needed and if still having coughing fits we can get you  a flutter valve next time  defer all GI issues to Kristina Jonny RuizJohn  Kristina FiremanHayes    05/31/2015  f/u ov/Katrin Grabel re: chronic cough resolved/ doe resolved on qvar 80 2bid with improved hfa / on pepcid bid and still on singulair  Chief Complaint  Patient presents with  . Follow-up    Pt states that her cough has resolved. She has had no mucus production.   Not limited by breathing from desired activities    rec Stop singular Try qvar 80 one twice daily  No change on pepcid or diet for now    08/04/2015  f/u ov/Remi Lopata re: chronic cough maint rx just qvar 80 one bid  Chief Complaint  Patient presents with  . Follow-up    Cough is still not present. She has had minimal hoarseness over the past few days.  some sense of am throat congestion first thing in am p pepcid hs   rec Ok to try off qvar - if notice any worsening start on 2 every 12 hours until better then one twice daily as a maintenance    12/21/2015  f/u ov/Fontella Shan re:  Recurrent cough while on h2 bid did not restart maint qvar Chief Complaint  Patient presents with  . Acute Visit    increased cough x 2-3 wks- prod with yellow/green sputum. She states cough bothers her in the am and then again in the evening. She also c/o minimal chest tightness.   stopped qvar p last visit until 3 weeks prior to OV  And took only a few puffs since then despite recurrence of same pattern cough as before   esp noticeable around 9am and 6 pm   Not limited by breathing from desired activities    No obvious day to day or daytime variability or assoc sob / cp or  subjective wheeze or overt sinus or hb symptoms. No unusual exp hx or h/o childhood pna/ asthma or knowledge of premature birth.  Sleeping ok without nocturnal  or early am exacerbation  of respiratory  c/o's or need for noct saba. Also denies any obvious fluctuation of symptoms with weather or environmental changes or other aggravating or alleviating factors except as outlined above   Current Medications,  Allergies, Complete Past Medical History, Past Surgical History, Family History, and Social History were reviewed in [facility] electronic medical record.  ROS  The following are not active complaints unless bolded sore throat, dysphagia, dental problems, itching, sneezing,  nasal congestion or excess/ purulent secretions, ear ache,   fever, chills, sweats, unintended wt loss, classically pleuritic or exertional cp, hemoptysis,  orthopnea pnd or leg swelling, presyncope, palpitations, abdominal pain, anorexia, nausea, vomiting, diarrhea  or change in bowel with freq loose stool  or bladder habits, change in stools or urine, dysuria,hematuria,  rash, arthralgias, visual complaints, headache, numbness, weakness or ataxia or problems with walking or coordination,  change in mood/affect or memory.               Objective:   Physical Exam   amb wf nad   05/31/2015        178 > 08/04/2015   178  > 12/21/2015  178     04/19/15 179 lb (81.194 kg)  03/08/15 174 lb (78.926 kg)  02/18/15 175 lb 6.4 oz (79.561 kg)    Vital signs reviewed / hbp noted      HEENT: nl dentition, turbinates, and orophanx. Nl external ear canals without cough reflex   NECK :  without JVD/Nodes/TM/ nl carotid upstrokes bilaterally   LUNGS: no acc muscle use,  CTA bilaterally   CV:  RRR  no s3 or murmur or increase in P2, no edema   ABD:  soft and nontender with nl excursion in the supine position. No bruits or organomegaly, bowel sounds nl  MS:  warm without deformities, calf tenderness, cyanosis or clubbing  SKIN: warm and dry without lesions    NEURO:  Alert x 3 .               Assessment & Plan:

## 2015-12-22 NOTE — Assessment & Plan Note (Signed)
-   02/16/15 pfts completely wnl  - qvar 80 bid started  03/08/15 - 04/19/2015    continue qvar 80 2bid > reduce to one bid 05/31/2015 > taper off 08/04/15  - 12/21/2015 flare off qvar > rec dulera 100 2bid prn   qvar is not going to work for her very well because she waits way too long to restart it - better choice is dulera 100 2bid "prn"  - The proper method of use, as well as anticipated side effects, of a metered-dose inhaler are discussed and demonstrated to the patient. Improved effectiveness after extensive coaching during this visit to a level of approximately 75 % from a baseline of 50 %    I had an extended discussion with the patient reviewing all relevant studies completed to date and  lasting 15 to 20 minutes of a 25 minute visit    Each maintenance medication was reviewed in detail including most importantly the difference between maintenance and prns and under what circumstances the prns are to be triggered using an action plan format that is not reflected in the computer generated alphabetically organized AVS.    Please see instructions for details which were reviewed in writing and the patient given a copy highlighting the part that I personally wrote and discussed at today's ov.

## 2015-12-22 NOTE — Assessment & Plan Note (Signed)
Not Adequate control on present rx, reviewed > no change in rx needed  For now > Follow up per Primary Care planned   

## 2015-12-22 NOTE — Assessment & Plan Note (Signed)
-   off acei since early May 2016  -CT sinus 01/26/2015  >>Normal CT of the paranasal sinuses. - GI eval by Kristina CookeyJohn [name] 01/26/15 > did not feel gerd an issue/ did not rec any w/u for this  -  01/21/15  Allergy profile  Eos 0.3   RAST neg , IgE 55  - 02/16/15 pfts completely wnl  -  02/18/2015 added singulair 10 mg daily > try off 05/31/2015  Present flare of cough sounds like possibly triggered by underlying sinusitis though it's always difficult to be sure since then goes on to develop cyclical features   rec try augmentinx 10 days and f/u prn <  100% control

## 2016-12-24 IMAGING — CT CT PARANASAL SINUSES LIMITED
1 of 3 series · 13 of 30 positions shown, 17 images · non-contrast
Comparison: None.

CLINICAL DATA: Persistent productive cough.

EXAM:
CT PARANASAL SINUS LIMITED WITHOUT CONTRAST
TECHNIQUE: Non-contiguous multidetector CT images of the paranasal sinuses were
obtained in a single plane without contrast.

[Series 7: ltd sinus 3.0 h60s · axial · 0.29mm/px · z∈[-104,-28]mm · 13 of 15 slices shown, 17 images]
[im 2/15  brain]
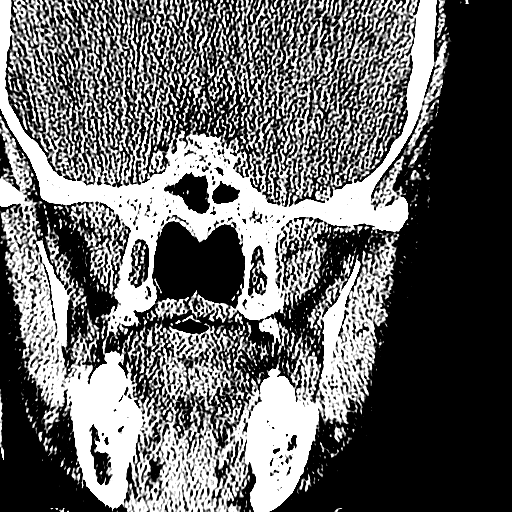
[im 2/15  bone]
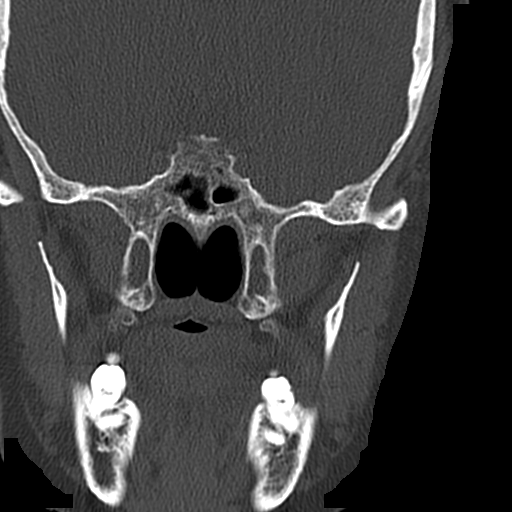
[im 3/15  bone]
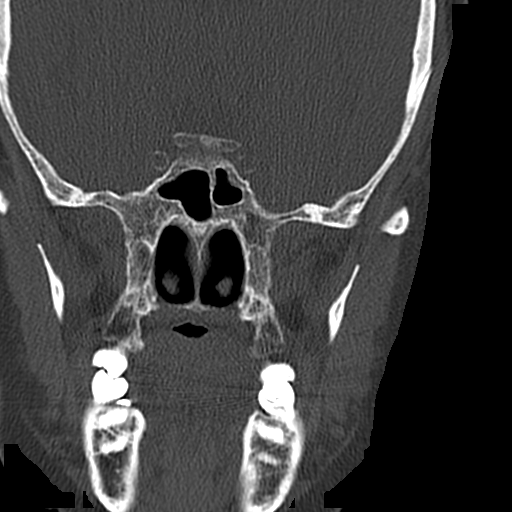
[im 4/15  bone]
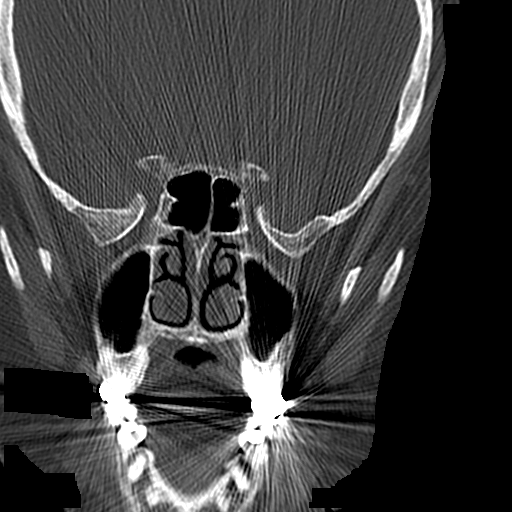
[im 5/15  bone]
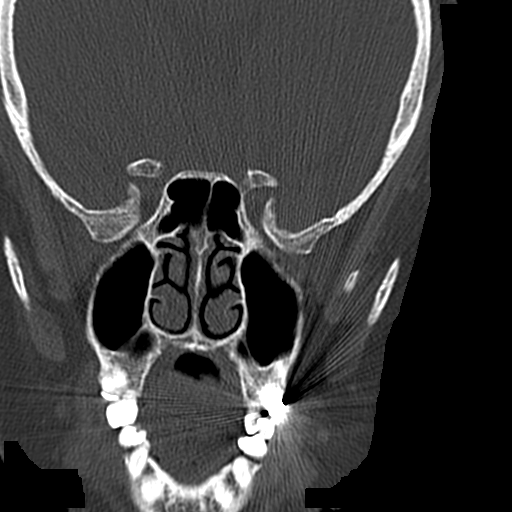
[im 6/15  brain]
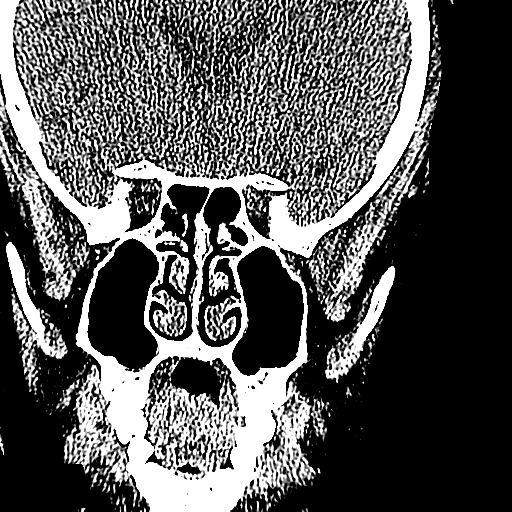
[im 6/15  bone]
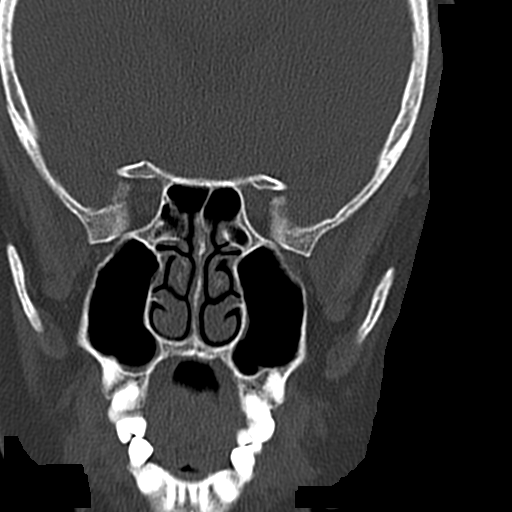
[im 7/15  bone]
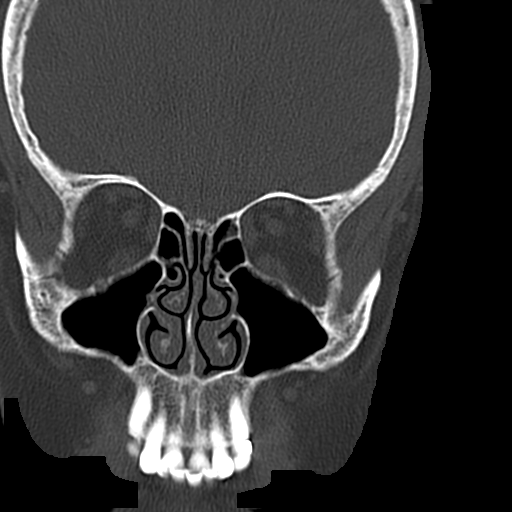
[im 8/15  bone]
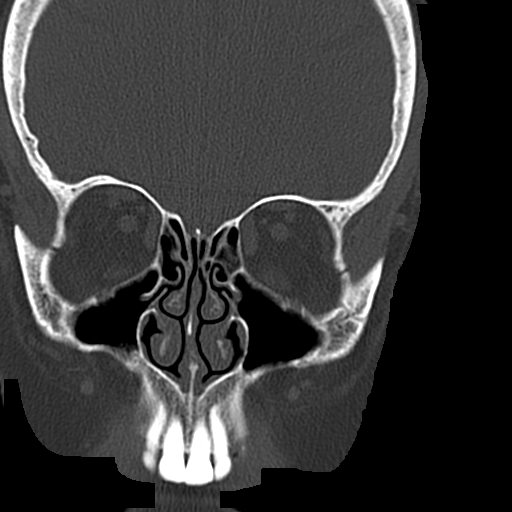
[im 9/15  bone]
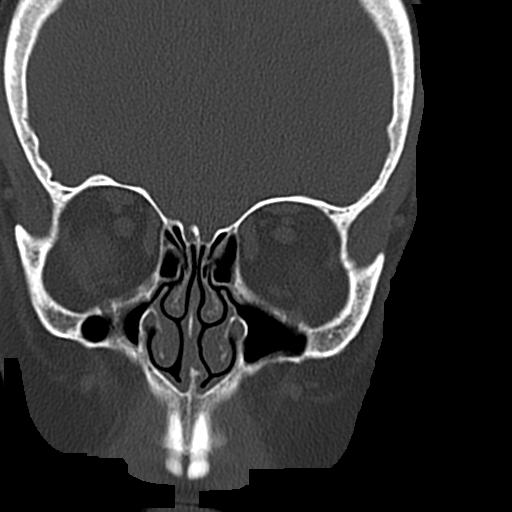
[im 10/15  brain]
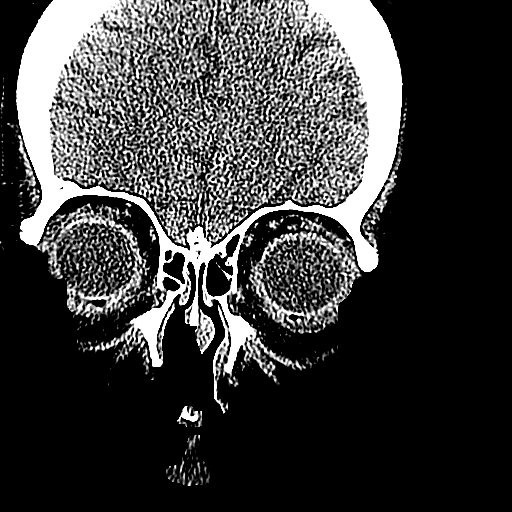
[im 10/15  bone]
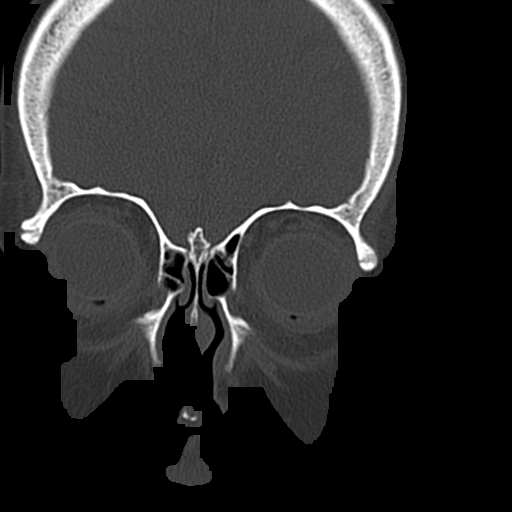
[im 11/15  bone]
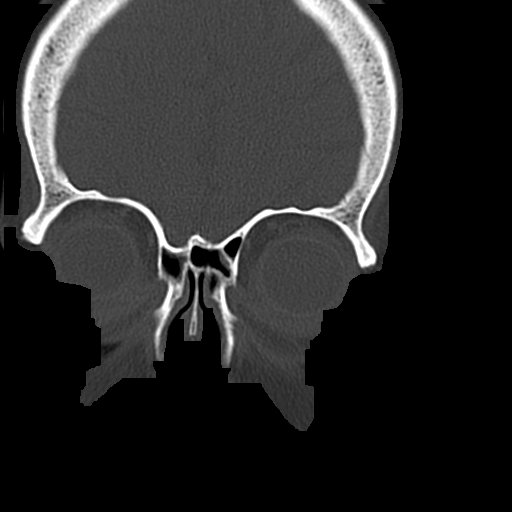
[im 12/15  bone]
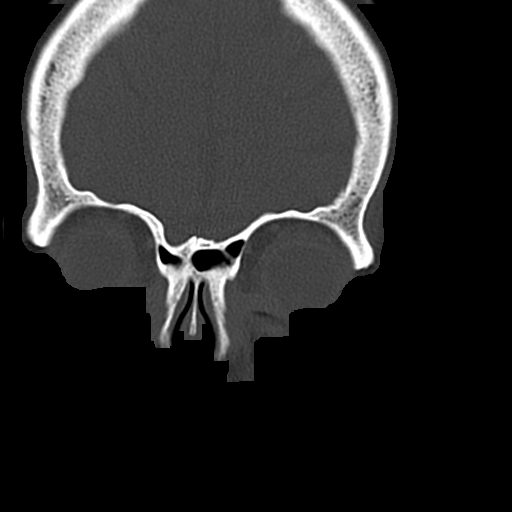
[im 13/15  bone]
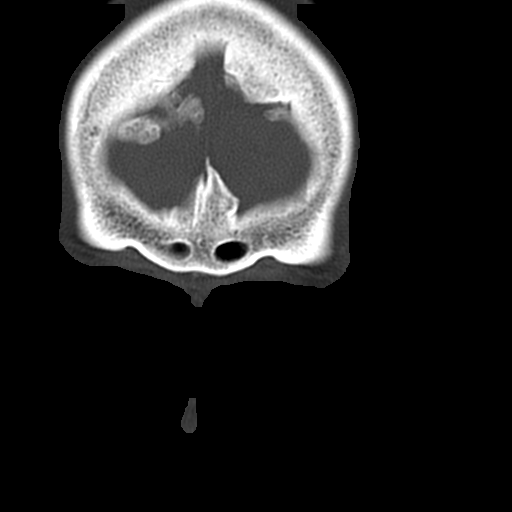
[im 14/15  brain]
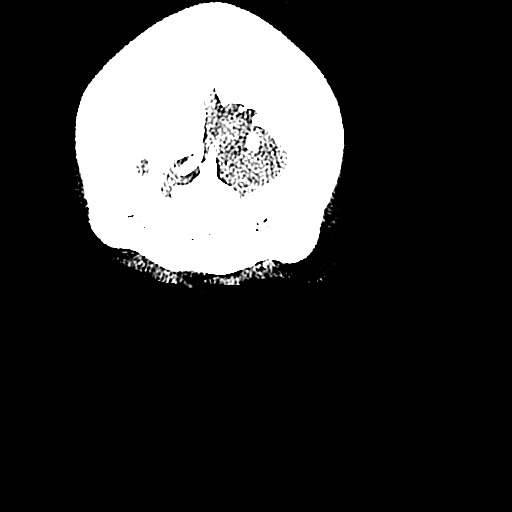
[im 14/15  bone]
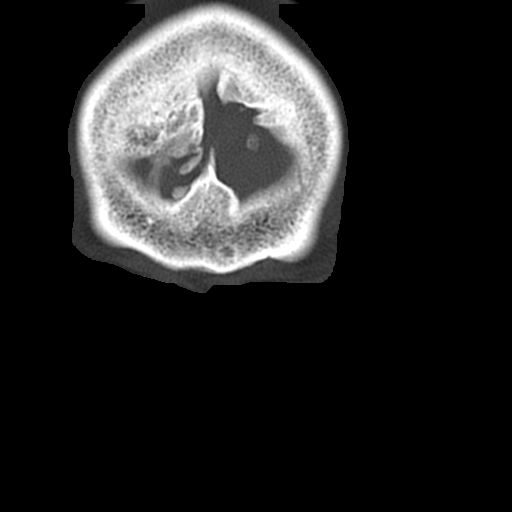

[13 of 30 positions shown; findings below may reference images not displayed]

FINDINGS: The paranasal sinuses including the sphenoid, ethmoid, frontal and
maxillary sinuses are identified without evidence of disease. No
air-fluid levels are identified. The nasal septum is midline. The
the ostiomeatal complexes are patent bilaterally. The
frontoethmoidal recesses are normal bilaterally. No bony
abnormalities are identified. The visualized portions of the brain
and orbits are unremarkable.
IMPRESSION: Normal CT of the paranasal sinuses.

## 2017-01-28 ENCOUNTER — Telehealth: Payer: Self-pay | Admitting: Cardiovascular Disease

## 2017-01-28 NOTE — Telephone Encounter (Signed)
New message    Pt is calling about her bp.   Pt c/o BP issue: STAT if pt c/o blurred vision, one-sided weakness or slurred speech  1. What are your last 5 BP readings? 150/84 night-148/81 after 10 minutes pulling weeds 109/62, 140/83 night-139/74, 153/84 night-152/82  2. Are you having any other symptoms (ex. Dizziness, headache, blurred vision, passed out)? Tired.   3. What is your BP issue? Pt states that her bp dropped after doing work outside. She is concerned.

## 2017-01-28 NOTE — Telephone Encounter (Signed)
Patient called concerned about fluctuating BP. She states her BP is usually 140-150/80s in the morning.  Over the weekend, she pulled weeds outside for a few minutes, went back inside, checked her BP and it was 109/62. Her only symptom was fatigue.  She denies dizziness, lightheadedness, and any other symptoms at the time. She is currently asymptomatic.  Patient reports her morning readings are always taken before she takes her medications. Her BP taken when she pulled weeds was after she took her medications. She states she has spoken with her PCP about her BP as well and was instructed to stop taking her BP so frequently as it was making her more anxious. Reiterated those instructions to the patient, and directed her to only check BP daily 1.5-2 hours AFTER she takes her medications and if she feels poorly. Reiterated to patient to stay hydrated while working outside and not to [employer] herself as it has been so hot.  Patient has scheduled herself later this month for overdue OV with Cardiology. She will take meds as directed until that time and will call prior if fatigue returns and does not subside. She was grateful for call and agrees with treatment plan.  While on the phone, patient reported she is taking Losartan 100 mg daily and Hycoscyamine 1.5 tablets daily. Med list updated.

## 2017-02-19 ENCOUNTER — Emergency Department (HOSPITAL_COMMUNITY)
Admission: EM | Admit: 2017-02-19 | Discharge: 2017-02-19 | Disposition: A | Discharge status: Home / Self Care | Payer: Medicare Other | Attending: Emergency Medicine | Admitting: Emergency Medicine

## 2017-02-19 DIAGNOSIS — I10 Essential (primary) hypertension: Secondary | ICD-10-CM | POA: Insufficient documentation

## 2017-02-19 DIAGNOSIS — R42 Dizziness and giddiness: Secondary | ICD-10-CM | POA: Diagnosis not present

## 2017-02-19 DIAGNOSIS — Z7982 Long term (current) use of aspirin: Secondary | ICD-10-CM | POA: Insufficient documentation

## 2017-02-19 DIAGNOSIS — Z794 Long term (current) use of insulin: Secondary | ICD-10-CM | POA: Insufficient documentation

## 2017-02-19 DIAGNOSIS — J45909 Unspecified asthma, uncomplicated: Secondary | ICD-10-CM | POA: Insufficient documentation

## 2017-02-19 DIAGNOSIS — E119 Type 2 diabetes mellitus without complications: Secondary | ICD-10-CM | POA: Insufficient documentation

## 2017-02-19 DIAGNOSIS — Z79899 Other long term (current) drug therapy: Secondary | ICD-10-CM | POA: Diagnosis not present

## 2017-02-19 DIAGNOSIS — E039 Hypothyroidism, unspecified: Secondary | ICD-10-CM | POA: Diagnosis not present

## 2017-02-19 LAB — URINALYSIS, ROUTINE W REFLEX MICROSCOPIC
BILIRUBIN URINE: NEGATIVE
Glucose, UA: NEGATIVE mg/dL
HGB URINE DIPSTICK: NEGATIVE
Ketones, ur: 5 mg/dL — AB
NITRITE: NEGATIVE
Protein, ur: NEGATIVE mg/dL
SPECIFIC GRAVITY, URINE: 1.01 (ref 1.005–1.030)
pH: 7 (ref 5.0–8.0)

## 2017-02-19 LAB — BASIC METABOLIC PANEL
ANION GAP: 11 (ref 5–15)
BUN: 15 mg/dL (ref 6–20)
CALCIUM: 10.1 mg/dL (ref 8.9–10.3)
CO2: 26 mmol/L (ref 22–32)
Chloride: 100 mmol/L — ABNORMAL LOW (ref 101–111)
Creatinine, Ser: 0.84 mg/dL (ref 0.44–1.00)
GLUCOSE: 108 mg/dL — AB (ref 65–99)
Potassium: 4.2 mmol/L (ref 3.5–5.1)
SODIUM: 137 mmol/L (ref 135–145)

## 2017-02-19 LAB — CBC
HCT: 40 % (ref 36.0–46.0)
HEMOGLOBIN: 14.3 g/dL (ref 12.0–15.0)
MCH: 32.8 pg (ref 26.0–34.0)
MCHC: 35.8 g/dL (ref 30.0–36.0)
MCV: 91.7 fL (ref 78.0–100.0)
Platelets: 207 10*3/uL (ref 150–400)
RBC: 4.36 MIL/uL (ref 3.87–5.11)
RDW: 12.8 % (ref 11.5–15.5)
WBC: 5.2 10*3/uL (ref 4.0–10.5)

## 2017-02-19 LAB — CBG MONITORING, ED: GLUCOSE-CAPILLARY: 92 mg/dL (ref 65–99)

## 2017-02-19 NOTE — ED Triage Notes (Signed)
Per EMS pt c/o dizziness and lightheadedness and symmetrical generalized weakness onset today at 0530 on standing up from bed.  Insulin-dependent diabetes, has not eaten today, CBG 84.

## 2017-02-19 NOTE — Discharge Instructions (Signed)
Continue taking your medications as prescribed. Stay well hydrated. Move slowly when changing positions. Follow-up with your cardiologist in 2 days as scheduled for reevaluation. Return to the ED if any concerning signs or symptoms develop.

## 2017-02-19 NOTE — ED Provider Notes (Signed)
[facility]-EMERGENCY DEPT Provider Note   CSN: [id number] Arrival date & time: 02/19/17  4098     History   Chief Complaint Chief Complaint  Patient presents with  . Dizziness    HPI Kristina [name] is a 71 y.o. female With history of DM, HLD, HTN, asthma who presents today with complaint of acute onset, resolved episode of generalized weakness and lightheadedness which began this morning. She states that at around 5:30 AM she awoke to use the restroom and when she got out of bed she noted she was lightheaded and felt generally weak. Endorsed transient shortness of breath with only lasted for a few moments. She states that she has had episodes of generalized weakness and fatigue every few days ever since her losartan dosage was increased to 100 mg twice a day 1.5 months ago. Also notes that her blood pressure was elevated with a systolic in the 180s. She states that when she takes her blood pressure several hours after taking her medication her systolic is in the 120s usually. Denies syncope, fevers, chills, chest pain, abdominal pain, nausea, vomiting, or diarrhea. States she has been drinking a lot of water lately. Denies dysuria, hematuria, frequency, or urgency. States that she has been treated for a chronic UTI by her primary care Dr. Kevan Ny most recently one month ago but is unsure if her UTI has cleared. Last cardiac workup was 2 years ago which she states was normal. She has a follow-up with cardiology in 2 days scheduled. The history is provided by the patient.    Past Medical History:  Diagnosis Date  . Anxiety   . Asthma   . Diabetes (HCC)   . Diabetes mellitus without complication (HCC)   . Diverticulitis   . GERD (gastroesophageal reflux disease)   . HTN (hypertension)   . Hypercholesteremia   . Hyperlipidemia   . Hypertension   . Obesity   . Rosacea   . Thyroid disease     Patient Active Problem List   Diagnosis Date Noted  . Morbid obesity (HCC) 08/08/2015  . Acute  hyponatremia 05/01/2015  . Gastroenteritis, acute 05/01/2015  . Diabetes mellitus type 2, controlled (HCC) 05/01/2015  . Asthma in remission 05/01/2015  . Diarrhea   . Cough variant asthma 04/19/2015  . Acute bronchitis 03/08/2015  . Hypothyroidism, adult 02/19/2015  . Dyspnea 02/18/2015  . Upper airway cough syndrome 01/07/2015  . Chest pain 09/15/2014  . Pre-syncope 12/08/2013  . PVC (premature ventricular contraction) 08/26/2013  . Rosacea   . Anxiety   . Diverticulitis   . Diabetes (HCC)   . Essential hypertension   . Obesity     Past Surgical History:  Procedure Laterality Date  . ACHILLES TENDON REPAIR    . APPENDECTOMY    . CESAREAN SECTION    . CHOLECYSTECTOMY    . hammertoe      OB History    Gravida Para Term Preterm AB Living   0 0 0 0 0     SAB TAB Ectopic Multiple Live Births   0 0 0           Home Medications    Prior to Admission medications   Medication Sig Start Date End Date Taking? Authorizing Provider  acetaminophen (TYLENOL) 500 MG tablet Take 1,000 mg by mouth 2 (two) times daily as needed (pain).   Yes [provider]  amLODipine (NORVASC) 5 MG tablet TAKE 1 TABLET BY MOUTH DAILY. 10/17/15  Yes [name], MD  aspirin 81 MG tablet Take 81 mg by mouth daily.   Yes [provider]  BYSTOLIC 10 MG tablet TAKE 1 TABLET (10 MG TOTAL) BY MOUTH DAILY. 07/15/15  Yes Nyoka CowdenWert, Michael B, MD  CALCIUM PO Take 500 mg by mouth at bedtime.   Yes [provider]  hyoscyamine (LEVSIN SL) 0.125 MG SL tablet Place 0.125 mg under the tongue daily. Patient takes 1.5 tablets daily.   Yes [provider]  insulin glargine (LANTUS) 100 unit/mL SOPN Inject 19-20 Units into the skin at bedtime. If patients sugar is around 80 she takes 19 unit and 110 or above take 20   Yes [provider]  levothyroxine (SYNTHROID, LEVOTHROID) 25 MCG tablet Take 25 mcg by mouth daily before breakfast.   Yes [provider]    losartan (COZAAR) 100 MG tablet Take 100 mg by mouth daily.   Yes [provider]  metFORMIN (GLUCOPHAGE) 500 MG tablet Take 2,000 mg by mouth at bedtime.   Yes [provider]  metroNIDAZOLE (METROCREAM) 0.75 % cream Apply topically 2 (two) times daily.   Yes [provider]  simvastatin (ZOCOR) 20 MG tablet Take 1 tablet by mouth daily. 08/05/13  Yes [provider]  amoxicillin-clavulanate (AUGMENTIN) 875-125 MG tablet Take 1 tablet by mouth 2 (two) times daily. Patient not taking: Reported on 02/19/2017 12/21/15   Nyoka CowdenWert, Michael B, MD  famotidine (PEPCID) 20 MG tablet One after breakdfast and One at bedtime Patient not taking: Reported on 02/19/2017 02/18/15   Nyoka CowdenWert, Michael B, MD  mometasone-formoterol [facility](DULERA) 100-5 MCG/ACT AERO Take 2 puffs first thing in am and then another 2 puffs about 12 hours later. Patient not taking: Reported on 02/19/2017 12/21/15   Nyoka CowdenWert, Michael B, MD    Family History Family History  Problem Relation Age of Onset  . Hypertension Mother   . Heart failure Mother   . Hypotension Father   . Dementia Father     Social History Social History  Substance Use Topics  . Smoking status: Never Smoker  . Smokeless tobacco: Not on file  . Alcohol use No     Allergies   Ace inhibitors; Ciprofloxacin; Doxycycline; and Shrimp [shellfish allergy]   Review of Systems Review of Systems  Constitutional: Positive for fatigue. Negative for chills and fever.  Respiratory: Negative for cough.   Cardiovascular: Negative for chest pain.  Gastrointestinal: Negative for abdominal pain, constipation, diarrhea and vomiting.  Genitourinary: Negative for dysuria, hematuria and urgency.  Neurological: Positive for light-headedness. Negative for dizziness, syncope, weakness, numbness and headaches.  Psychiatric/Behavioral: Negative for confusion.  All other systems reviewed and are negative.    Physical Exam Updated Vital Signs BP 130/89 (BP  Location: Left Arm)   Pulse 73   Temp 97.8 F (36.6 C) (Oral) Comment: Simultaneous filing. User may not have seen previous data. Comment (Src): Simultaneous filing. User may not have seen previous data.  Resp 19   SpO2 99%   Physical Exam  Constitutional: She is oriented to person, place, and time. She appears well-developed and well-nourished. No distress.  HENT:  Head: Normocephalic and atraumatic.  Right Ear: External ear normal.  Left Ear: External ear normal.  Eyes: Pupils are equal, round, and reactive to light. Conjunctivae and EOM are normal. Right eye exhibits no discharge. Left eye exhibits no discharge. No scleral icterus.  Neck: Normal range of motion. Neck supple. No JVD present. No tracheal deviation present.  Cardiovascular: Normal rate, regular rhythm, normal heart sounds  and intact distal pulses.   2+ radial and DP/PT pulses bl, negative Homan's bl, no lower extremity edema  Pulmonary/Chest: Effort normal and breath sounds normal. No respiratory distress. She has no wheezes. She has no rales. She exhibits no tenderness.  Abdominal: Soft. Bowel sounds are normal. She exhibits no distension. There is no tenderness.  Musculoskeletal: Normal range of motion. She exhibits no edema or tenderness.  No midline spine TTP, no paraspinal muscle tenderness, no deformity, crepitus, or step-off noted. Normal range of motion of extremities, 5/5 strength of BUE and BLE major muscle groups.  Lymphadenopathy:    She has no cervical adenopathy.  Neurological: She is alert and oriented to person, place, and time. No cranial nerve deficit or sensory deficit.  Fluent speech, no facial droop, sensation intact to soft touch of extremities, normal gait, and patient able to heel walk and toe walk without difficulty. No pronator drift, normal finger to nose, cranial nerves III through XII tested and intact   Skin: Skin is warm and dry. No erythema.  Psychiatric: She has a normal mood and affect.  Her behavior is normal.  Nursing note and vitals reviewed.    ED Treatments / Results  Labs (all labs ordered are listed, but only abnormal results are displayed) Labs Reviewed  BASIC METABOLIC PANEL - Abnormal; Notable for the following:       Result Value   Chloride 100 (*)    Glucose, Bld 108 (*)    All other components within normal limits  URINALYSIS, ROUTINE W REFLEX MICROSCOPIC - Abnormal; Notable for the following:    Color, Urine STRAW (*)    Ketones, ur 5 (*)    Leukocytes, UA LARGE (*)    Bacteria, UA RARE (*)    Squamous Epithelial / LPF 0-5 (*)    All other components within normal limits  URINE CULTURE  CBC  CBG MONITORING, ED    EKG  EKG Interpretation  Date/Time:  Tuesday February 19 2017 09:41:51 EDT Ventricular Rate:  75 PR Interval:    QRS Duration: 81 QT Interval:  374 QTC Calculation: 418 R Axis:   42 Text Interpretation:  Sinus rhythm Since prior ECG, rhythm now more clearly sinus No other significant changes Confirmed by Alvira Monday ([id number]) on 02/19/2017 4:17:29 PM       Radiology No results found.  Procedures Procedures (including critical care time)  Medications Ordered in ED Medications - No data to display   Initial Impression / Assessment and Plan / ED Course  I have reviewed the triage vital signs and the nursing notes.  Pertinent labs & imaging results that were available during my care of the patient were reviewed by me and considered in my medical decision making (see chart for details).     Patient with complaint of an episode of lightheadedness and generalized weakness earlier today, which appears to have been longer in duration than episodes she has been having recently. Assymptomatic while in the ED. Initially hypertensive with significant improvement while in the ED. This is likely due to the fact that she had taken her blood pressure medications shortly PTA. No leukocytosis and no anemia on CBC, no significant electrolyte  abnormalities. EKG shows normal sinus rhythm andunchanged from last. UA is somewhat concerning for UTI. However, patient states she has been treated for chronic UTI and is denying any urinary symptoms. Will send for culture. Patient is mildly orthostatic, suggesting hypovolemia or possible medication interaction. Low suspicion of pneumonia, bronchitis, pericarditis, or  other acute cardiopulmonary abnormality with no physical findings and no fever. She has a follow-up with her cardiologist in 2 days. She stable for discharge home with reevaluation with her primary care and her cardiologist. I suspect this is likely a medication interaction causing her symptoms. Discussed indications for return to the ED. Pt verbalized understanding of and agreement with plan and is safe for discharge home at this time.   Final Clinical Impressions(s) / ED Diagnoses   Final diagnoses:  Lightheadedness    New Prescriptions Discharge Medication List as of 02/19/2017  2:40 PM       Jeanie Sewer, PA-C 02/19/17 1626    Bethann Berkshire, MD 02/20/17 [phone]

## 2017-02-21 ENCOUNTER — Ambulatory Visit (INDEPENDENT_AMBULATORY_CARE_PROVIDER_SITE_OTHER): Payer: Medicare Other | Admitting: Cardiology

## 2017-02-21 ENCOUNTER — Encounter: Payer: Self-pay | Admitting: Cardiology

## 2017-02-21 VITALS — BP 138/82 | HR 73 | Ht 64.0 in | Wt 180.4 lb

## 2017-02-21 DIAGNOSIS — I959 Hypotension, unspecified: Secondary | ICD-10-CM

## 2017-02-21 DIAGNOSIS — I493 Ventricular premature depolarization: Secondary | ICD-10-CM

## 2017-02-21 DIAGNOSIS — R42 Dizziness and giddiness: Secondary | ICD-10-CM

## 2017-02-21 DIAGNOSIS — E119 Type 2 diabetes mellitus without complications: Secondary | ICD-10-CM

## 2017-02-21 DIAGNOSIS — I10 Essential (primary) hypertension: Secondary | ICD-10-CM | POA: Diagnosis not present

## 2017-02-21 DIAGNOSIS — Z794 Long term (current) use of insulin: Secondary | ICD-10-CM

## 2017-02-21 LAB — URINE CULTURE

## 2017-02-21 MED ORDER — AMLODIPINE BESYLATE 10 MG PO TABS: 10.0000 mg | ORAL_TABLET | Freq: Every day | ORAL | 3 refills | Status: DC

## 2017-02-21 NOTE — Progress Notes (Signed)
Cardiology Office Note   Date:  02/21/2017   ID:  Kristina [name], Park MeoDOB 09/16/1945, MRN [id number]  PCP:  Shaune PollackGates, Donna, MD  Cardiologist:  Dr. Eden EmmsNishan    Chief Complaint  Patient presents with  . Hypertension      History of Present Illness: Kristina SaxLorraine T [name] is a 71 y.o. female who presents for fluctating BP 140-150 to 109 systolic.   Originally seen for primary PVCs and abnormal EKG.  She has hx of HTN, CRF, HLD and DM on insulin. Echo in 2015 with EF 55-60% mild concentric LVH, G1DD ETT at same time negative for ischemia HTN response and no increase in ectopy   Most recent echo 2016 with EF 60-65%, no RWMA and G1 DD.  Mild focal basal hypertrophy of the septum  Was seen in ER 02/19/17 with dizziness and lightheadedness, weakness. - her CBG was 84.  Her BP improved but continues to be labile.  She has also been feeling weak at times.  Her labs recently checked by her PCP and stable, including thyroid.  BP has been as above though low at times.  But more elevated.    Discussed importance of staying hydrated.  Will check echo to see if any changes with her HTN on LV function.  She is not aware of palpitations.. No chest pain and maybe some mild SOB only.      Past Medical History:  Diagnosis Date  . Anxiety   . Asthma   . Diabetes (HCC)   . Diabetes mellitus without complication (HCC)   . Diverticulitis   . GERD (gastroesophageal reflux disease)   . HTN (hypertension)   . Hypercholesteremia   . Hyperlipidemia   . Hypertension   . Obesity   . Rosacea   . Thyroid disease     Past Surgical History:  Procedure Laterality Date  . ACHILLES TENDON REPAIR    . APPENDECTOMY    . CESAREAN SECTION    . CHOLECYSTECTOMY    . hammertoe       Current Outpatient Prescriptions  Medication Sig Dispense Refill  . acetaminophen (TYLENOL) 500 MG tablet Take 1,000 mg by mouth 2 (two) times daily as needed (pain).    Marland Kitchen. amoxicillin-clavulanate (AUGMENTIN) 875-125 MG tablet Take 1 tablet  by mouth 2 (two) times daily. 20 tablet 0  . aspirin 81 MG tablet Take 81 mg by mouth daily.    Marland Kitchen. BYSTOLIC 10 MG tablet TAKE 1 TABLET (10 MG TOTAL) BY MOUTH DAILY. 30 tablet 5  . CALCIUM PO Take 500 mg by mouth at bedtime.    . famotidine (PEPCID) 20 MG tablet One after breakdfast and One at bedtime    . hyoscyamine (LEVSIN SL) 0.125 MG SL tablet Place 0.125 mg under the tongue daily. Patient takes 1.5 tablets daily.    . insulin glargine (LANTUS) 100 unit/mL SOPN Inject 19-20 Units into the skin at bedtime. If patients sugar is around 80 she takes 19 unit and 110 or above take 20    . levothyroxine (SYNTHROID, LEVOTHROID) 25 MCG tablet Take 25 mcg by mouth daily before breakfast.    . losartan (COZAAR) 100 MG tablet Take 100 mg by mouth daily.    . metFORMIN (GLUCOPHAGE) 500 MG tablet Take 2,000 mg by mouth at bedtime.    . metroNIDAZOLE (METROCREAM) 0.75 % cream Apply topically 2 (two) times daily.    . mometasone-formoterol (DULERA) 100-5 MCG/ACT AERO Take 2 puffs first thing in am and then another 2  puffs about 12 hours later. 1 Inhaler 11  . simvastatin (ZOCOR) 20 MG tablet Take 1 tablet by mouth daily.    Marland Kitchen amLODipine (NORVASC) 10 MG tablet Take 1 tablet (10 mg total) by mouth daily. 180 tablet 3   No current facility-administered medications for this visit.     Allergies:   Ace inhibitors; Ciprofloxacin; Doxycycline; and Shrimp [shellfish allergy]    Social History:  The patient  reports that she has never smoked. She has never used smokeless tobacco. She reports that she does not drink alcohol or use drugs.   Family History:  The patient's family history includes Dementia in her father; Heart failure in her mother; Hypertension in her mother; Hypotension in her father.    ROS:  General:no colds or fevers, no weight changes Skin:no rashes or ulcers HEENT:no blurred vision, no congestion CV:see HPI PUL:see HPI GI:no diarrhea constipation or melena, no indigestion GU:no  hematuria, no dysuria MS:no joint pain, no claudication Neuro:no syncope, + lightheadedness Endo:+ diabetes, + thyroid disease  Wt Readings from Last 3 Encounters:  02/21/17 180 lb 6.4 oz (81.8 kg)  12/21/15 178 lb 6.4 oz (80.9 kg)  08/04/15 178 lb (80.7 kg)     PHYSICAL EXAM: VS:  BP 138/82   Pulse 73   Ht 5\' 4"  (1.626 m)   Wt 180 lb 6.4 oz (81.8 kg)   BMI 30.97 kg/m  , BMI Body mass index is 30.97 kg/m. General:Pleasant affect, NAD Skin:Warm and dry, brisk capillary refill HEENT:normocephalic, sclera clear, mucus membranes moist Neck:supple, no JVD, no bruits  Heart:S1S2 RRR without murmur, gallup, rub or click Lungs:clear without rales, rhonchi, or wheezes WUJ:WJXB, non tender, + BS, do not palpate liver spleen or masses Ext:no lower ext edema, 2+ pedal pulses, 2+ radial pulses Neuro:alert and oriented, MAE, follows commands, + facial symmetry    EKG:  EKG is ordered today. The ekg ordered today demonstrates SR normal EKG    Recent Labs: 02/19/2017: BUN 15; Creatinine, Ser 0.84; Hemoglobin 14.3; Platelets 207; Potassium 4.2; Sodium 137   TSH 3.30 on 12/17/16 LDL 56, HDL 42, hgb A1c 6.5, TG 166. BUN 10, Cr 0.74  Lipid Panel No results found for: CHOL, TRIG, HDL, CHOLHDL, VLDL, LDLCALC, LDLDIRECT     Other studies Reviewed: Additional studies/ records that were reviewed today include:  Echo 09/16/14 .Study Conclusions  - Left ventricle: The cavity size was normal. There was mild focal basal hypertrophy of the septum. Systolic function was normal. The estimated ejection fraction was in the range of 60% to 65%. Wall motion was normal; there were no regional wall motion abnormalities. Doppler parameters are consistent with abnormal left ventricular relaxation (grade 1 diastolic dysfunction).  Impressions:  - Normal LV function; grade 1 diastolic dysfunction; trace TR.   ASSESSMENT AND PLAN:  1.  HTN essential, will increase amlodipine to 10 mg  daily.  If BP remains high she will call or if it is decreasing more significantly she will call.  I will have her see Dr. Eden Emms after the echo which we will do to eval for hypertrophy.   2.  Lightheadedness may be from HTN, will see how she does with improved control  It could also be due to hypoglycemia she will monitor.  3. PVCs no recent awareness  4. DM-2 followed by her PCP.    Current medicines are reviewed with the patient today.  The patient Has no concerns regarding medicines.  The following changes have been made:  See above  Labs/ tests ordered today include:see above  Disposition:   FU:  see above  Signed, Nada BoozerLaura Katira Dumais, NP  02/21/2017 4:38 PM    [facility] [address], [place], [place]  [address]/ [address] [place], [place] Phone: [phone]; Fax: [phone]  [phone]

## 2017-02-21 NOTE — Patient Instructions (Signed)
Medication Instructions:  Your physician has recommended you make the following change in your medication: 1.) we are increasing your Amlodipine to 10 mg daily.   Labwork: None Ordered   Testing/Procedures: Your physician has requested that you have an echocardiogram. Echocardiography is a painless test that uses sound waves to create images of your heart. It provides your doctor with information about the size and shape of your heart and how well your heart's chambers and valves are working. This procedure takes approximately one hour. There are no restrictions for this procedure.    Follow-Up: Your physician recommends that at you schedule a follow-up appointment in: 3-4 months with Dr. Eden EmmsNishan.   Any Other Special Instructions Will Be Listed Below (If Applicable).     If you need a refill on your cardiac medications before your next appointment, please call your pharmacy.

## 2017-02-26 ENCOUNTER — Other Ambulatory Visit (HOSPITAL_COMMUNITY): Payer: Self-pay

## 2017-03-07 ENCOUNTER — Ambulatory Visit (HOSPITAL_COMMUNITY): Payer: Medicare Other | Attending: Cardiology

## 2017-03-07 ENCOUNTER — Other Ambulatory Visit: Payer: Self-pay

## 2017-03-07 DIAGNOSIS — I340 Nonrheumatic mitral (valve) insufficiency: Secondary | ICD-10-CM | POA: Diagnosis not present

## 2017-03-07 DIAGNOSIS — I517 Cardiomegaly: Secondary | ICD-10-CM | POA: Insufficient documentation

## 2017-03-07 DIAGNOSIS — I959 Hypotension, unspecified: Secondary | ICD-10-CM | POA: Insufficient documentation

## 2017-03-08 ENCOUNTER — Telehealth: Payer: Self-pay

## 2017-03-08 DIAGNOSIS — R0609 Other forms of dyspnea: Principal | ICD-10-CM

## 2017-03-08 NOTE — Telephone Encounter (Signed)
Patient reports the shortness of breath episodes have only occurred for about 1.5 weeks. Kristina DunnLexiscan myoview ordered for scheduling. Reviewed instructions and patient has no questions. She was grateful for call and agrees with treatment plan.

## 2017-03-08 NOTE — Telephone Encounter (Signed)
-----   Message from Leone BrandLaura R Ingold, NP sent at 03/08/2017  3:01 PM EDT ----- May be related to her BP up and down, on visit she had mild SOB so has this increased?  Also may may be related to heat and humidity as well.  Let's do lexiscan myoview to make sure not due to lack of blood supply to her heart.  Thanks.

## 2017-03-12 ENCOUNTER — Telehealth (HOSPITAL_COMMUNITY): Payer: Self-pay | Admitting: *Deleted

## 2017-03-12 NOTE — Telephone Encounter (Signed)
Patient given detailed instructions per Myocardial Perfusion Study Information Sheet for the test on 03/14/17 at 9:45. Patient notified to arrive 15 minutes early and that it is imperative to arrive on time for appointment to keep from having the test rescheduled.  If you need to cancel or reschedule your appointment, please call the office within 24 hours of your appointment. . Patient verbalized understanding.Daneil DolinSharon S Brooks

## 2017-03-14 ENCOUNTER — Ambulatory Visit (HOSPITAL_COMMUNITY): Payer: Medicare Other | Attending: Cardiology

## 2017-03-14 DIAGNOSIS — I10 Essential (primary) hypertension: Secondary | ICD-10-CM | POA: Diagnosis not present

## 2017-03-14 DIAGNOSIS — R079 Chest pain, unspecified: Secondary | ICD-10-CM | POA: Insufficient documentation

## 2017-03-14 DIAGNOSIS — E119 Type 2 diabetes mellitus without complications: Secondary | ICD-10-CM | POA: Diagnosis not present

## 2017-03-14 DIAGNOSIS — R0609 Other forms of dyspnea: Secondary | ICD-10-CM | POA: Diagnosis not present

## 2017-03-14 DIAGNOSIS — I2510 Atherosclerotic heart disease of native coronary artery without angina pectoris: Secondary | ICD-10-CM | POA: Diagnosis not present

## 2017-03-14 DIAGNOSIS — R42 Dizziness and giddiness: Secondary | ICD-10-CM | POA: Insufficient documentation

## 2017-03-14 MED ORDER — TECHNETIUM TC 99M TETROFOSMIN IV KIT
32.7000 | PACK | Freq: Once | INTRAVENOUS | Status: AC | PRN
  Administered 2017-03-14: 32.7 via INTRAVENOUS
  Filled 2017-03-14: qty 33

## 2017-03-14 MED ORDER — REGADENOSON 0.4 MG/5ML IV SOLN
0.4000 mg | Freq: Once | INTRAVENOUS | Status: AC
  Administered 2017-03-14: 0.4 mg via INTRAVENOUS

## 2017-03-14 MED ORDER — TECHNETIUM TC 99M TETROFOSMIN IV KIT
10.9000 | PACK | Freq: Once | INTRAVENOUS | Status: AC | PRN
  Administered 2017-03-14: 10.9 via INTRAVENOUS
  Filled 2017-03-14: qty 11

## 2017-03-15 LAB — MYOCARDIAL PERFUSION IMAGING
CHL CUP NUCLEAR SSS: 0
CSEPPHR: 100 {beats}/min
LVDIAVOL: 97 mL (ref 46–106)
LVSYSVOL: 47 mL
RATE: 0.28
Rest HR: 73 {beats}/min
SDS: 0
SRS: 0
TID: 1.07

## 2017-06-21 NOTE — Progress Notes (Deleted)
Cardiology Office Note   Date:  06/21/2017   ID:  Kristina SaxLorraine T Zanella, Park MeoDOB 09/07/1945, MRN [id number]  PCP:  Shaune PollackGates, Donna, MD  Cardiologist:  Dr. Eden EmmsNishan    No chief complaint on file.     History of Present Illness: Kristina [name] is a 71 y.o. female who presents for fluctating BP 140-150 to 109 systolic.   Originally seen for primary PVCs and abnormal EKG.  She has hx of HTN, CRF, HLD and DM on insulin. Echo in 2015 with EF 55-60% mild concentric LVH, G1DD ETT at same time negative for ischemia HTN response and no increase in ectopy   Most recent echo 2016 with EF 60-65%, no RWMA and G1 DD.  Mild focal basal hypertrophy of the septum  Was seen in ER 02/19/17 with dizziness and lightheadedness, weakness. - her CBG was 84.  Her BP improved but continues to be labile.  She has also been feeling weak at times.  Her labs recently checked by her PCP and stable, including thyroid.  BP has been as above though low at times.  But more elevated.    Discussed importance of staying hydrated.  She is not aware of palpitations.. No chest pain and maybe some mild SOB only.      Seen by PA July 2018  myovue ordered and normal no ischemica EF 51% visually normal images reviewed Echo reviewed 03/07/17 EF 55-60% mild MR grade 2 diastolic septum only 9 mm  ***  Past Medical History:  Diagnosis Date  . Anxiety   . Asthma   . Diabetes (HCC)   . Diabetes mellitus without complication (HCC)   . Diverticulitis   . GERD (gastroesophageal reflux disease)   . HTN (hypertension)   . Hypercholesteremia   . Hyperlipidemia   . Hypertension   . Obesity   . Rosacea   . Thyroid disease     Past Surgical History:  Procedure Laterality Date  . ACHILLES TENDON REPAIR    . APPENDECTOMY    . CESAREAN SECTION    . CHOLECYSTECTOMY    . hammertoe       Current Outpatient Medications  Medication Sig Dispense Refill  . acetaminophen (TYLENOL) 500 MG tablet Take 1,000 mg by mouth 2 (two) times daily as  needed (pain).    Marland Kitchen. amLODipine (NORVASC) 10 MG tablet Take 1 tablet (10 mg total) by mouth daily. 180 tablet 3  . amoxicillin-clavulanate (AUGMENTIN) 875-125 MG tablet Take 1 tablet by mouth 2 (two) times daily. 20 tablet 0  . aspirin 81 MG tablet Take 81 mg by mouth daily.    Marland Kitchen. BYSTOLIC 10 MG tablet TAKE 1 TABLET (10 MG TOTAL) BY MOUTH DAILY. 30 tablet 5  . CALCIUM PO Take 500 mg by mouth at bedtime.    . famotidine (PEPCID) 20 MG tablet One after breakdfast and One at bedtime    . hyoscyamine (LEVSIN SL) 0.125 MG SL tablet Place 0.125 mg under the tongue daily. Patient takes 1.5 tablets daily.    . insulin glargine (LANTUS) 100 unit/mL SOPN Inject 19-20 Units into the skin at bedtime. If patients sugar is around 80 she takes 19 unit and 110 or above take 20    . levothyroxine (SYNTHROID, LEVOTHROID) 25 MCG tablet Take 25 mcg by mouth daily before breakfast.    . losartan (COZAAR) 100 MG tablet Take 100 mg by mouth daily.    . metFORMIN (GLUCOPHAGE) 500 MG tablet Take 2,000 mg by mouth at bedtime.    .Marland Kitchen  metroNIDAZOLE (METROCREAM) 0.75 % cream Apply topically 2 (two) times daily.    . mometasone-formoterol (DULERA) 100-5 MCG/ACT AERO Take 2 puffs first thing in am and then another 2 puffs about 12 hours later. 1 Inhaler 11  . simvastatin (ZOCOR) 20 MG tablet Take 1 tablet by mouth daily.     No current facility-administered medications for this visit.     Allergies:   Ace inhibitors; Ciprofloxacin; Doxycycline; and Shrimp [shellfish allergy]    Social History:  The patient  reports that  has never smoked. she has never used smokeless tobacco. She reports that she does not drink alcohol or use drugs.   Family History:  The patient's family history includes Dementia in her father; Heart failure in her mother; Hypertension in her mother; Hypotension in her father.    ROS:  General:no colds or fevers, no weight changes Skin:no rashes or ulcers HEENT:no blurred vision, no congestion CV:see  HPI PUL:see HPI GI:no diarrhea constipation or melena, no indigestion GU:no hematuria, no dysuria MS:no joint pain, no claudication Neuro:no syncope, + lightheadedness Endo:+ diabetes, + thyroid disease  Wt Readings from Last 3 Encounters:  03/14/17 180 lb (81.6 kg)  02/21/17 180 lb 6.4 oz (81.8 kg)  12/21/15 178 lb 6.4 oz (80.9 kg)     PHYSICAL EXAM: VS:  There were no vitals taken for this visit. , BMI There is no height or weight on file to calculate BMI. General:Pleasant affect, NAD Skin:Warm and dry, brisk capillary refill HEENT:normocephalic, sclera clear, mucus membranes moist Neck:supple, no JVD, no bruits  Heart:S1S2 RRR without murmur, gallup, rub or click Lungs:clear without rales, rhonchi, or wheezes MWN:UUVOAbd:soft, non tender, + BS, do not palpate liver spleen or masses Ext:no lower ext edema, 2+ pedal pulses, 2+ radial pulses Neuro:alert and oriented, MAE, follows commands, + facial symmetry    EKG:  EKG is ordered today. The ekg ordered today demonstrates SR normal EKG    Recent Labs: 02/19/2017: BUN 15; Creatinine, Ser 0.84; Hemoglobin 14.3; Platelets 207; Potassium 4.2; Sodium 137   TSH 3.30 on 12/17/16 LDL 56, HDL 42, hgb A1c 6.5, TG 166. BUN 10, Cr 0.74  Lipid Panel No results found for: CHOL, TRIG, HDL, CHOLHDL, VLDL, LDLCALC, LDLDIRECT    ASSESSMENT AND PLAN:  1.  HTN: Well controlled.  Continue current medications and low sodium Dash type diet.     2.  Lightheadedness non cardiac ? Some lability of BP and dysautonomia from DM   3. PVCs no recent awareness normal EF and normal myovue   4. DM-2:  Discussed low carb diet.  Target hemoglobin A1c is 6.5 or less.  Continue current medications.   Charlton HawsPeter Nishan

## 2017-06-26 ENCOUNTER — Ambulatory Visit: Payer: Self-pay | Admitting: Cardiovascular Disease

## 2017-06-26 NOTE — Progress Notes (Signed)
Cardiology Office Note   Date:  06/27/2017   ID:  Ardath SaxLorraine T Grajales, DOB 02/24/1946, MRN [id number]  PCP:  Shaune PollackGates, Donna, MD  Cardiologist:  Dr. Eden EmmsNishan    No chief complaint on file.     History of Present Illness: Ardath SaxLorraine T Garbers is a 71 y.o. female who presents for fluctating BP and labile HTN   Originally seen for primary PVCs and abnormal EKG.  She has hx of HTN, CRF, HLD and DM on insulin. Echo in 2015 with EF 55-60% mild concentric LVH, G1DD ETT at same time negative for ischemia HTN response and no increase in ectopy   Echo 2016 with EF 60-65%, no RWMA and G1 DD.  Mild focal basal hypertrophy of the septum  Was seen in ER 02/19/17 with dizziness and lightheadedness, weakness. - her CBG was 84.  Her BP improved but continues to be labile.  She has also been feeling weak at times.  Her labs recently checked by her PCP and stable, including thyroid.  BP has been as above though low at times.  But more elevated.    Discussed importance of staying hydrated.  She is not aware of palpitations.. No chest pain and maybe some mild SOB only.      Seen by PA July 2018  myovue ordered  03/14/17 and normal no ischemica EF 51% visually normal images reviewed Echo reviewed 03/07/17 EF 55-60% mild MR grade 2 diastolic septum only 9 mm   BP better including home readings with higher dose amlodipine   Past Medical History:  Diagnosis Date  . Anxiety   . Asthma   . Diabetes (HCC)   . Diabetes mellitus without complication (HCC)   . Diverticulitis   . GERD (gastroesophageal reflux disease)   . HTN (hypertension)   . Hypercholesteremia   . Hyperlipidemia   . Hypertension   . Obesity   . Rosacea   . Thyroid disease     Past Surgical History:  Procedure Laterality Date  . ACHILLES TENDON REPAIR    . APPENDECTOMY    . CESAREAN SECTION    . CHOLECYSTECTOMY    . hammertoe       Current Outpatient Medications  Medication Sig Dispense Refill  . acetaminophen (TYLENOL) 500 MG tablet  Take 1,000 mg by mouth 2 (two) times daily as needed (pain).    . AMLODIPINE BESYLATE PO Take 10 mg by mouth daily.    Marland Kitchen. amoxicillin-clavulanate (AUGMENTIN) 875-125 MG tablet Take 1 tablet by mouth 2 (two) times daily. 20 tablet 0  . aspirin 81 MG tablet Take 81 mg by mouth daily.    Marland Kitchen. BYSTOLIC 10 MG tablet TAKE 1 TABLET (10 MG TOTAL) BY MOUTH DAILY. 30 tablet 5  . CALCIUM PO Take 500 mg by mouth at bedtime.    . famotidine (PEPCID) 20 MG tablet One after breakdfast and One at bedtime    . hyoscyamine (LEVSIN SL) 0.125 MG SL tablet Place 0.125 mg under the tongue daily. Patient takes 1.5 tablets daily.    . insulin glargine (LANTUS) 100 unit/mL SOPN Inject 19-20 Units into the skin at bedtime. If patients sugar is around 80 she takes 19 unit and 110 or above take 20    . levothyroxine (SYNTHROID, LEVOTHROID) 25 MCG tablet Take 25 mcg by mouth daily before breakfast.    . losartan (COZAAR) 100 MG tablet Take 100 mg by mouth daily.    . metFORMIN (GLUCOPHAGE) 500 MG tablet Take 2,000 mg by mouth  at bedtime.    . metroNIDAZOLE (METROCREAM) 0.75 % cream Apply topically 2 (two) times daily.    . mometasone-formoterol (DULERA) 100-5 MCG/ACT AERO Take 2 puffs first thing in am and then another 2 puffs about 12 hours later. 1 Inhaler 11  . simvastatin (ZOCOR) 20 MG tablet Take 1 tablet by mouth daily.     No current facility-administered medications for this visit.     Allergies:   Ace inhibitors; Ciprofloxacin; Doxycycline; and Shrimp [shellfish allergy]    Social History:  The patient  reports that  has never smoked. she has never used smokeless tobacco. She reports that she does not drink alcohol or use drugs.   Family History:  The patient's family history includes Dementia in her father; Heart failure in her mother; Hypertension in her mother; Hypotension in her father.    ROS:  General:no colds or fevers, no weight changes Skin:no rashes or ulcers HEENT:no blurred vision, no  congestion CV:see HPI PUL:see HPI GI:no diarrhea constipation or melena, no indigestion GU:no hematuria, no dysuria MS:no joint pain, no claudication Neuro:no syncope, + lightheadedness Endo:+ diabetes, + thyroid disease  Wt Readings from Last 3 Encounters:  06/27/17 178 lb (80.7 kg)  03/14/17 180 lb (81.6 kg)  02/21/17 180 lb 6.4 oz (81.8 kg)     PHYSICAL EXAM: VS:  BP (!) 154/78   Pulse 83   Ht 5\' 4"  (1.626 m)   Wt 178 lb (80.7 kg)   SpO2 97%   BMI 30.55 kg/m  , BMI Body mass index is 30.55 kg/m. General:Pleasant affect, NAD Skin:Warm and dry, brisk capillary refill HEENT:normocephalic, sclera clear, mucus membranes moist Neck:supple, no JVD, no bruits  Heart:S1S2 RRR 1/6 systolic ejectin murmur, gallup, rub or click Lungs:clear without rales, rhonchi, or wheezes ZOX:WRUEAbd:soft, non tender, + BS, do not palpate liver spleen or masses Ext:no lower ext edema, 2+ pedal pulses, 2+ radial pulses Neuro:alert and oriented, MAE, follows commands, + facial symmetry    EKG:  EKG is ordered today. The ekg ordered today demonstrates SR normal EKG    Recent Labs: 02/19/2017: BUN 15; Creatinine, Ser 0.84; Hemoglobin 14.3; Platelets 207; Potassium 4.2; Sodium 137   TSH 3.30 on 12/17/16 LDL 56, HDL 42, hgb A1c 6.5, TG 166. BUN 10, Cr 0.74  Lipid Panel No results found for: CHOL, TRIG, HDL, CHOLHDL, VLDL, LDLCALC, LDLDIRECT    ASSESSMENT AND PLAN:  1.  HTN: Well controlled.  Continue current medications and low sodium Dash type diet.    2.  Lightheadedness non cardiac ? Some lability of BP and dysautonomia from DM   3. PVCs no recent awareness normal EF and normal myovue   4. DM-2:  Discussed low carb diet.  Target hemoglobin A1c is 6.5 or less.  Continue current medications.   Charlton HawsPeter Zacari Radick

## 2017-06-27 ENCOUNTER — Ambulatory Visit: Payer: Medicare Other | Admitting: Cardiovascular Disease

## 2017-06-27 ENCOUNTER — Encounter (INDEPENDENT_AMBULATORY_CARE_PROVIDER_SITE_OTHER): Payer: Self-pay

## 2017-06-27 ENCOUNTER — Encounter: Payer: Self-pay | Admitting: Cardiovascular Disease

## 2017-06-27 VITALS — BP 154/78 | HR 83 | Ht 64.0 in | Wt 178.0 lb

## 2017-06-27 DIAGNOSIS — R0609 Other forms of dyspnea: Secondary | ICD-10-CM

## 2017-06-27 DIAGNOSIS — I493 Ventricular premature depolarization: Secondary | ICD-10-CM

## 2017-06-27 DIAGNOSIS — I10 Essential (primary) hypertension: Secondary | ICD-10-CM | POA: Diagnosis not present

## 2017-06-27 NOTE — Patient Instructions (Addendum)

## 2018-01-09 DIAGNOSIS — M25561 Pain in right knee: Secondary | ICD-10-CM | POA: Insufficient documentation

## 2018-02-12 DIAGNOSIS — E785 Hyperlipidemia, unspecified: Secondary | ICD-10-CM | POA: Insufficient documentation

## 2018-02-12 DIAGNOSIS — I10 Essential (primary) hypertension: Secondary | ICD-10-CM | POA: Insufficient documentation

## 2018-03-21 ENCOUNTER — Encounter (HOSPITAL_COMMUNITY): Payer: Self-pay

## 2018-03-21 ENCOUNTER — Emergency Department (HOSPITAL_COMMUNITY): Payer: Medicare Other

## 2018-03-21 ENCOUNTER — Inpatient Hospital Stay (HOSPITAL_COMMUNITY)
Admission: EM | Admit: 2018-03-21 | Discharge: 2018-03-24 | DRG: 690 | Disposition: A | Discharge status: Home / Self Care | Payer: Medicare Other | Source: Ambulatory Visit | Attending: Internal Medicine | Admitting: Internal Medicine

## 2018-03-21 ENCOUNTER — Other Ambulatory Visit: Payer: Self-pay

## 2018-03-21 DIAGNOSIS — N12 Tubulo-interstitial nephritis, not specified as acute or chronic: Secondary | ICD-10-CM | POA: Diagnosis not present

## 2018-03-21 DIAGNOSIS — Z881 Allergy status to other antibiotic agents status: Secondary | ICD-10-CM | POA: Diagnosis not present

## 2018-03-21 DIAGNOSIS — Z888 Allergy status to other drugs, medicaments and biological substances status: Secondary | ICD-10-CM | POA: Diagnosis not present

## 2018-03-21 DIAGNOSIS — E119 Type 2 diabetes mellitus without complications: Secondary | ICD-10-CM | POA: Diagnosis present

## 2018-03-21 DIAGNOSIS — E669 Obesity, unspecified: Secondary | ICD-10-CM | POA: Diagnosis present

## 2018-03-21 DIAGNOSIS — K589 Irritable bowel syndrome without diarrhea: Secondary | ICD-10-CM

## 2018-03-21 DIAGNOSIS — Z79890 Hormone replacement therapy (postmenopausal): Secondary | ICD-10-CM | POA: Diagnosis not present

## 2018-03-21 DIAGNOSIS — N136 Pyonephrosis: Principal | ICD-10-CM | POA: Diagnosis present

## 2018-03-21 DIAGNOSIS — I10 Essential (primary) hypertension: Secondary | ICD-10-CM | POA: Diagnosis present

## 2018-03-21 DIAGNOSIS — E876 Hypokalemia: Secondary | ICD-10-CM

## 2018-03-21 DIAGNOSIS — Z6830 Body mass index (BMI) 30.0-30.9, adult: Secondary | ICD-10-CM | POA: Diagnosis not present

## 2018-03-21 DIAGNOSIS — Z7982 Long term (current) use of aspirin: Secondary | ICD-10-CM | POA: Diagnosis not present

## 2018-03-21 DIAGNOSIS — E785 Hyperlipidemia, unspecified: Secondary | ICD-10-CM | POA: Diagnosis present

## 2018-03-21 DIAGNOSIS — E7800 Pure hypercholesterolemia, unspecified: Secondary | ICD-10-CM | POA: Diagnosis present

## 2018-03-21 DIAGNOSIS — N390 Urinary tract infection, site not specified: Secondary | ICD-10-CM | POA: Diagnosis present

## 2018-03-21 DIAGNOSIS — E039 Hypothyroidism, unspecified: Secondary | ICD-10-CM

## 2018-03-21 DIAGNOSIS — N10 Acute tubulo-interstitial nephritis: Secondary | ICD-10-CM | POA: Diagnosis present

## 2018-03-21 DIAGNOSIS — Z91013 Allergy to seafood: Secondary | ICD-10-CM | POA: Diagnosis not present

## 2018-03-21 DIAGNOSIS — K5730 Diverticulosis of large intestine without perforation or abscess without bleeding: Secondary | ICD-10-CM | POA: Diagnosis present

## 2018-03-21 DIAGNOSIS — Z794 Long term (current) use of insulin: Secondary | ICD-10-CM

## 2018-03-21 DIAGNOSIS — E118 Type 2 diabetes mellitus with unspecified complications: Secondary | ICD-10-CM | POA: Diagnosis not present

## 2018-03-21 DIAGNOSIS — A419 Sepsis, unspecified organism: Secondary | ICD-10-CM | POA: Diagnosis not present

## 2018-03-21 DIAGNOSIS — K580 Irritable bowel syndrome with diarrhea: Secondary | ICD-10-CM | POA: Diagnosis not present

## 2018-03-21 DIAGNOSIS — Z79899 Other long term (current) drug therapy: Secondary | ICD-10-CM | POA: Diagnosis not present

## 2018-03-21 DIAGNOSIS — E079 Disorder of thyroid, unspecified: Secondary | ICD-10-CM

## 2018-03-21 DIAGNOSIS — R109 Unspecified abdominal pain: Secondary | ICD-10-CM | POA: Diagnosis not present

## 2018-03-21 LAB — URINALYSIS, ROUTINE W REFLEX MICROSCOPIC
Bacteria, UA: NONE SEEN
Bilirubin Urine: NEGATIVE
GLUCOSE, UA: NEGATIVE mg/dL
Ketones, ur: NEGATIVE mg/dL
Nitrite: NEGATIVE
Protein, ur: NEGATIVE mg/dL
Specific Gravity, Urine: 1.006 (ref 1.005–1.030)
WBC, UA: >50 WBC/hpf — ABNORMAL HIGH (ref 0–5)
pH: 6 (ref 5.0–8.0)

## 2018-03-21 LAB — CBC WITH DIFFERENTIAL/PLATELET
BASOS ABS: 0 10*3/uL (ref 0.0–0.1)
Basophils Relative: 0 %
Eosinophils Absolute: 0.1 10*3/uL (ref 0.0–0.7)
Eosinophils Relative: 1 %
HCT: 36.5 % (ref 36.0–46.0)
Hemoglobin: 12.5 g/dL (ref 12.0–15.0)
LYMPHS ABS: 0.3 10*3/uL — AB (ref 0.7–4.0)
Lymphocytes Relative: 3 %
MCH: 32.6 pg (ref 26.0–34.0)
MCHC: 34.2 g/dL (ref 30.0–36.0)
MCV: 95.3 fL (ref 78.0–100.0)
MONO ABS: 0.2 10*3/uL (ref 0.1–1.0)
Monocytes Relative: 3 %
NEUTROS ABS: 8.3 10*3/uL — AB (ref 1.7–7.7)
Neutrophils Relative %: 93 %
Platelets: 191 10*3/uL (ref 150–400)
RBC: 3.83 MIL/uL — AB (ref 3.87–5.11)
RDW: 13.1 % (ref 11.5–15.5)
WBC: 8.9 10*3/uL (ref 4.0–10.5)

## 2018-03-21 LAB — COMPREHENSIVE METABOLIC PANEL
ALT: 14 U/L (ref 0–44)
AST: 21 U/L (ref 15–41)
Albumin: 4 g/dL (ref 3.5–5.0)
Alkaline Phosphatase: 53 U/L (ref 38–126)
Anion gap: 8 (ref 5–15)
BILIRUBIN TOTAL: 1 mg/dL (ref 0.3–1.2)
BUN: 14 mg/dL (ref 8–23)
CO2: 27 mmol/L (ref 22–32)
CREATININE: 0.75 mg/dL (ref 0.44–1.00)
Calcium: 9.5 mg/dL (ref 8.9–10.3)
Chloride: 103 mmol/L (ref 98–111)
GFR calc Af Amer: >60 mL/min (ref >60)
Glucose, Bld: 137 mg/dL — ABNORMAL HIGH (ref 70–99)
Potassium: 4.1 mmol/L (ref 3.5–5.1)
Sodium: 138 mmol/L (ref 135–145)
TOTAL PROTEIN: 7.2 g/dL (ref 6.5–8.1)

## 2018-03-21 LAB — GLUCOSE, CAPILLARY
Glucose-Capillary: 102 mg/dL — ABNORMAL HIGH (ref 70–99)
Glucose-Capillary: 136 mg/dL — ABNORMAL HIGH (ref 70–99)

## 2018-03-21 LAB — LIPASE, BLOOD: LIPASE: 19 U/L (ref 11–51)

## 2018-03-21 LAB — I-STAT CG4 LACTIC ACID, ED
Lactic Acid, Venous: 1.78 mmol/L (ref 0.5–1.9)
Lactic Acid, Venous: 0.89 mmol/L (ref 0.5–1.9)

## 2018-03-21 MED ORDER — INSULIN GLARGINE 100 UNIT/ML ~~LOC~~ SOLN
12.0000 [IU] | Freq: Every day | SUBCUTANEOUS | Status: DC
  Administered 2018-03-21: 12 [IU] via SUBCUTANEOUS
  Filled 2018-03-21 (×2): qty 0.12

## 2018-03-21 MED ORDER — MORPHINE SULFATE (PF) 4 MG/ML IV SOLN: 4.0000 mg | Freq: Once | INTRAVENOUS | Status: DC

## 2018-03-21 MED ORDER — INSULIN ASPART 100 UNIT/ML ~~LOC~~ SOLN
0.0000 [IU] | Freq: Three times a day (TID) | SUBCUTANEOUS | Status: DC
  Administered 2018-03-22: 3 [IU] via SUBCUTANEOUS

## 2018-03-21 MED ORDER — SODIUM CHLORIDE 0.9% FLUSH
3.0000 mL | Freq: Two times a day (BID) | INTRAVENOUS | Status: DC
  Administered 2018-03-21 – 2018-03-23 (×5): 3 mL via INTRAVENOUS

## 2018-03-21 MED ORDER — HYOSCYAMINE SULFATE 0.125 MG SL SUBL
0.1250 mg | SUBLINGUAL_TABLET | Freq: Two times a day (BID) | SUBLINGUAL | Status: DC
  Administered 2018-03-22 – 2018-03-24 (×5): 0.125 mg via SUBLINGUAL
  Filled 2018-03-21 (×6): qty 1

## 2018-03-21 MED ORDER — ASPIRIN EC 81 MG PO TBEC
81.0000 mg | DELAYED_RELEASE_TABLET | Freq: Every day | ORAL | Status: DC
  Administered 2018-03-22 – 2018-03-24 (×3): 81 mg via ORAL
  Filled 2018-03-21 (×3): qty 1

## 2018-03-21 MED ORDER — LOSARTAN POTASSIUM 50 MG PO TABS
100.0000 mg | ORAL_TABLET | Freq: Every day | ORAL | Status: DC
  Administered 2018-03-22 – 2018-03-24 (×3): 100 mg via ORAL
  Filled 2018-03-21 (×3): qty 2

## 2018-03-21 MED ORDER — ENOXAPARIN SODIUM 40 MG/0.4ML ~~LOC~~ SOLN
40.0000 mg | SUBCUTANEOUS | Status: DC
  Administered 2018-03-21 – 2018-03-23 (×3): 40 mg via SUBCUTANEOUS
  Filled 2018-03-21 (×3): qty 0.4

## 2018-03-21 MED ORDER — ONDANSETRON HCL 4 MG/2ML IJ SOLN: 4.0000 mg | Freq: Four times a day (QID) | INTRAMUSCULAR | Status: DC | PRN

## 2018-03-21 MED ORDER — VANCOMYCIN HCL IN DEXTROSE 1-5 GM/200ML-% IV SOLN
1000.0000 mg | Freq: Once | INTRAVENOUS | Status: AC
  Administered 2018-03-21: 1000 mg via INTRAVENOUS
  Filled 2018-03-21: qty 200

## 2018-03-21 MED ORDER — ONDANSETRON HCL 4 MG/2ML IJ SOLN: 4.0000 mg | Freq: Once | INTRAMUSCULAR | Status: DC

## 2018-03-21 MED ORDER — SODIUM CHLORIDE 0.9 % IV BOLUS (SEPSIS)
1000.0000 mL | Freq: Once | INTRAVENOUS | Status: AC
  Administered 2018-03-21: 1000 mL via INTRAVENOUS

## 2018-03-21 MED ORDER — METRONIDAZOLE IN NACL 5-0.79 MG/ML-% IV SOLN
500.0000 mg | Freq: Three times a day (TID) | INTRAVENOUS | Status: DC
  Administered 2018-03-21 – 2018-03-22 (×3): 500 mg via INTRAVENOUS
  Filled 2018-03-21 (×3): qty 100

## 2018-03-21 MED ORDER — INSULIN ASPART 100 UNIT/ML ~~LOC~~ SOLN: 0.0000 [IU] | Freq: Every day | SUBCUTANEOUS | Status: DC

## 2018-03-21 MED ORDER — SODIUM CHLORIDE 0.9 % IV BOLUS
1000.0000 mL | Freq: Once | INTRAVENOUS | Status: AC
  Administered 2018-03-21: 1000 mL via INTRAVENOUS

## 2018-03-21 MED ORDER — SODIUM CHLORIDE 0.9 % IV SOLN
2.0000 g | Freq: Once | INTRAVENOUS | Status: AC
  Administered 2018-03-21: 2 g via INTRAVENOUS
  Filled 2018-03-21: qty 2

## 2018-03-21 MED ORDER — LEVOTHYROXINE SODIUM 25 MCG PO TABS
25.0000 ug | ORAL_TABLET | Freq: Every day | ORAL | Status: DC
  Administered 2018-03-22 – 2018-03-24 (×3): 25 ug via ORAL
  Filled 2018-03-21 (×3): qty 1

## 2018-03-21 MED ORDER — AMLODIPINE BESYLATE 5 MG PO TABS
5.0000 mg | ORAL_TABLET | Freq: Every day | ORAL | Status: DC
  Administered 2018-03-22 – 2018-03-24 (×3): 5 mg via ORAL
  Filled 2018-03-21 (×3): qty 1

## 2018-03-21 MED ORDER — ACETAMINOPHEN 325 MG PO TABS
650.0000 mg | ORAL_TABLET | Freq: Four times a day (QID) | ORAL | Status: DC | PRN
  Administered 2018-03-22: 650 mg via ORAL
  Filled 2018-03-21: qty 2

## 2018-03-21 MED ORDER — SODIUM CHLORIDE 0.9 % IV SOLN: 250.0000 mL | INTRAVENOUS | Status: DC | PRN

## 2018-03-21 MED ORDER — SIMVASTATIN 10 MG PO TABS
20.0000 mg | ORAL_TABLET | Freq: Every day | ORAL | Status: DC
  Administered 2018-03-22 – 2018-03-24 (×3): 20 mg via ORAL
  Filled 2018-03-21 (×3): qty 2

## 2018-03-21 MED ORDER — NEBIVOLOL HCL 10 MG PO TABS
10.0000 mg | ORAL_TABLET | Freq: Every day | ORAL | Status: DC
  Administered 2018-03-21 – 2018-03-23 (×3): 10 mg via ORAL
  Filled 2018-03-21 (×3): qty 1

## 2018-03-21 MED ORDER — ACETAMINOPHEN 650 MG RE SUPP: 650.0000 mg | Freq: Four times a day (QID) | RECTAL | Status: DC | PRN

## 2018-03-21 MED ORDER — SODIUM CHLORIDE 0.9 % IV SOLN
1.0000 g | INTRAVENOUS | Status: DC
  Administered 2018-03-21 – 2018-03-23 (×3): 1 g via INTRAVENOUS
  Filled 2018-03-21 (×3): qty 1
  Filled 2018-03-21: qty 10

## 2018-03-21 MED ORDER — SODIUM CHLORIDE 0.9 % IV BOLUS (SEPSIS)
500.0000 mL | Freq: Once | INTRAVENOUS | Status: AC
  Administered 2018-03-21: 500 mL via INTRAVENOUS

## 2018-03-21 MED ORDER — SODIUM CHLORIDE 0.9% FLUSH: 3.0000 mL | INTRAVENOUS | Status: DC | PRN

## 2018-03-21 MED ORDER — ONDANSETRON HCL 4 MG PO TABS: 4.0000 mg | ORAL_TABLET | Freq: Four times a day (QID) | ORAL | Status: DC | PRN

## 2018-03-21 NOTE — ED Provider Notes (Addendum)
[facility]-EMERGENCY DEPT Provider Note   CSN: [id number] Arrival date & time: 03/21/18  1151     History   Chief Complaint Chief Complaint  Patient presents with  . Altered Mental Status    flank pain    HPI Kristina [name] is a 72 y.o. female.  HPI 72 year old female brought to the emergency department for increasing confusion and increasing bilateral flank pain with urinary symptoms over the past 24 to 48 hours.  She reports chills at home without documented fever.  She presented the emergency department with a fever of 104.4 with vomiting and some confusion.  Denies abdominal pain at this time.  But reports bilateral flank pain.  Symptoms are moderate in severity.  No cough or congestion.  No shortness of breath.  No chest pain.  No new rash.     Past Medical History:  Diagnosis Date  . Anxiety   . Asthma   . Diabetes (HCC)   . Diabetes mellitus without complication (HCC)   . Diverticulitis   . GERD (gastroesophageal reflux disease)   . HTN (hypertension)   . Hypercholesteremia   . Hyperlipidemia   . Hypertension   . Obesity   . Rosacea   . Thyroid disease     Patient Active Problem List   Diagnosis Date Noted  . Morbid obesity (HCC) 08/08/2015  . Acute hyponatremia 05/01/2015  . Gastroenteritis, acute 05/01/2015  . Diabetes mellitus type 2, controlled (HCC) 05/01/2015  . Asthma in remission 05/01/2015  . Diarrhea   . Cough variant asthma 04/19/2015  . Acute bronchitis 03/08/2015  . Hypothyroidism, adult 02/19/2015  . Dyspnea 02/18/2015  . Upper airway cough syndrome 01/07/2015  . Chest pain 09/15/2014  . Pre-syncope 12/08/2013  . PVC (premature ventricular contraction) 08/26/2013  . Rosacea   . Anxiety   . Diverticulitis   . Diabetes (HCC)   . Essential hypertension   . Obesity     Past Surgical History:  Procedure Laterality Date  . ACHILLES TENDON REPAIR    . APPENDECTOMY    . CESAREAN SECTION    . CHOLECYSTECTOMY      . hammertoe       OB History    Gravida  0   Para  0   Term  0   Preterm  0   AB  0   Living        SAB  0   TAB  0   Ectopic  0   Multiple      Live Births               Home Medications    Prior to Admission medications   Medication Sig Start Date End Date Taking? Authorizing Provider  acetaminophen (TYLENOL) 500 MG tablet Take 1,000 mg by mouth 2 (two) times daily as needed (pain).   Yes [provider]  amLODipine (NORVASC) 5 MG tablet Take 5 mg by mouth daily. 01/01/18  Yes [provider]  aspirin 81 MG tablet Take 81 mg by mouth daily.   Yes [provider]  BYSTOLIC 10 MG tablet TAKE 1 TABLET (10 MG TOTAL) BY MOUTH DAILY. Patient taking differently: Take 10 mg by mouth daily.  07/15/15  Yes Nyoka Cowden, MD  CALCIUM PO Take 500 mg by mouth at bedtime.   Yes [provider]  hyoscyamine (LEVSIN SL) 0.125 MG SL tablet Place 0.125 mg under the tongue 2 (two) times daily.    Yes [provider]  insulin glargine (LANTUS) 100 unit/mL SOPN Inject 18 Units into the skin at bedtime.    Yes [provider]  levothyroxine (SYNTHROID, LEVOTHROID) 25 MCG tablet Take 25 mcg by mouth daily before breakfast.   Yes [provider]  losartan (COZAAR) 100 MG tablet Take 100 mg by mouth daily.   Yes [provider]  metFORMIN (GLUCOPHAGE-XR) 500 MG 24 hr tablet Take 2,000 mg by mouth daily. 12/24/17  Yes [provider]  simvastatin (ZOCOR) 20 MG tablet Take 1 tablet by mouth daily. 08/05/13  Yes [provider]  tiZANidine (ZANAFLEX) 4 MG tablet Take 4 mg by mouth 3 (three) times daily as needed for muscle spasms.   Yes [provider]  amoxicillin-clavulanate (AUGMENTIN) 875-125 MG tablet Take 1 tablet by mouth 2 (two) times daily. Patient not taking: Reported on 03/21/2018 12/21/15   Nyoka Cowden, MD  famotidine (PEPCID) 20 MG tablet One after breakdfast and One at  bedtime Patient not taking: Reported on 03/21/2018 02/18/15   Nyoka Cowden, MD  mometasone-formoterol [facility]) 100-5 MCG/ACT AERO Take 2 puffs first thing in am and then another 2 puffs about 12 hours later. Patient not taking: Reported on 03/21/2018 12/21/15   Nyoka Cowden, MD    Family History Family History  Problem Relation Age of Onset  . Hypertension Mother   . Heart failure Mother   . Hypotension Father   . Dementia Father     Social History Social History   Tobacco Use  . Smoking status: Never Smoker  . Smokeless tobacco: Never Used  Substance Use Topics  . Alcohol use: No    Alcohol/week: 0.0 standard drinks  . Drug use: No     Allergies   Ace inhibitors; Ciprofloxacin; Doxycycline; and Shrimp [shellfish allergy]   Review of Systems Review of Systems  All other systems reviewed and are negative.    Physical Exam Updated Vital Signs BP 140/65 (BP Location: Right Arm)   Pulse 100   Temp (!) 104.4 F (40.2 C) (Rectal)   Resp 18   Ht 5\' 4"  (1.626 m)   Wt 79.4 kg   SpO2 100%   BMI 30.04 kg/m   Physical Exam  Constitutional: She is oriented to person, place, and time. She appears well-developed and well-nourished. No distress.  HENT:  Head: Normocephalic and atraumatic.  Eyes: EOM are normal.  Neck: Normal range of motion.  Cardiovascular: Normal rate, regular rhythm and normal heart sounds.  Pulmonary/Chest: Effort normal and breath sounds normal.  Abdominal: Soft. She exhibits no distension. There is no tenderness.  Musculoskeletal: Normal range of motion.  Neurological: She is alert and oriented to person, place, and time.  Skin: Skin is warm and dry.  Psychiatric: She has a normal mood and affect. Judgment normal.  Nursing note and vitals reviewed.    ED Treatments / Results  Labs (all labs ordered are listed, but only abnormal results are displayed) Labs Reviewed  CBC WITH DIFFERENTIAL/PLATELET - Abnormal; Notable for the following  components:      Result Value   RBC 3.83 (*)    Neutro Abs 8.3 (*)    Lymphs Abs 0.3 (*)    All other components within normal limits  COMPREHENSIVE METABOLIC PANEL - Abnormal; Notable for the following components:   Glucose, Bld 137 (*)    All other components within normal limits  URINALYSIS, ROUTINE W REFLEX MICROSCOPIC - Abnormal; Notable for the following components:   Color, Urine  STRAW (*)    APPearance HAZY (*)    Hgb urine dipstick SMALL (*)    Leukocytes, UA LARGE (*)    WBC, UA >50 (*)    All other components within normal limits  CULTURE, BLOOD (ROUTINE X 2)  CULTURE, BLOOD (ROUTINE X 2)  URINE CULTURE  LIPASE, BLOOD  I-STAT CG4 LACTIC ACID, ED  I-STAT CG4 LACTIC ACID, ED    EKG EKG Interpretation  Date/Time:  Friday March 21 2018 12:26:43 EDT Ventricular Rate:  104 PR Interval:    QRS Duration: 70 QT Interval:  316 QTC Calculation: 416 R Axis:   56 Text Interpretation:  Sinus tachycardia Minimal ST depression, lateral leads No significant change was found Confirmed by Azalia Bilisampos, Notnamed Croucher ([id number]) on 03/21/2018 4:18:50 PM   Radiology Dg Chest Portable 1 View  Result Date: 03/21/2018 CLINICAL DATA:  Fever and vomiting EXAM: PORTABLE CHEST 1 VIEW COMPARISON:  May 01, 2015 FINDINGS: No edema or consolidation. Heart is upper normal in size with pulmonary vascularity normal. No adenopathy. There is aortic atherosclerosis. No evident bone lesions. IMPRESSION: Aortic atherosclerosis. No edema or consolidation. Heart upper normal in size. Aortic Atherosclerosis (ICD10-I70.0). Electronically Signed   By: Bretta BangWilliam  Woodruff III M.D.   On: 03/21/2018 12:50    Procedures .Critical Care Performed by: Azalia Bilisampos, Abelino Tippin, MD Authorized by: Azalia Bilisampos, Cambre Matson, MD     CRITICAL CARE Performed by: Azalia BilisKevin Suren Payne Total critical care time: 31 minutes Critical care time was exclusive of separately billable procedures and treating other patients. Critical care was necessary to treat or  prevent imminent or life-threatening deterioration. Critical care was time spent personally by me on the following activities: development of treatment plan with patient and/or surrogate as well as nursing, discussions with consultants, evaluation of patient's response to treatment, examination of patient, obtaining history from patient or surrogate, ordering and performing treatments and interventions, ordering and review of laboratory studies, ordering and review of radiographic studies, pulse oximetry and re-evaluation of patient's condition.   Medications Ordered in ED Medications  ondansetron (ZOFRAN) injection 4 mg (0 mg Intravenous Hold 03/21/18 1325)  morphine 4 MG/ML injection 4 mg (0 mg Intravenous Hold 03/21/18 1325)  sodium chloride 0.9 % bolus 1,000 mL (1,000 mLs Intravenous New Bag/Given 03/21/18 1252)    And  sodium chloride 0.9 % bolus 1,000 mL (1,000 mLs Intravenous New Bag/Given 03/21/18 1359)    And  sodium chloride 0.9 % bolus 500 mL (has no administration in time range)  metroNIDAZOLE (FLAGYL) IVPB 500 mg (500 mg Intravenous New Bag/Given 03/21/18 1358)  vancomycin (VANCOCIN) IVPB 1000 mg/200 mL premix (has no administration in time range)  sodium chloride 0.9 % bolus 1,000 mL (1,000 mLs Intravenous New Bag/Given 03/21/18 1251)  ceFEPIme (MAXIPIME) 2 g in sodium chloride 0.9 % 100 mL IVPB (0 g Intravenous Stopped 03/21/18 1346)     Initial Impression / Assessment and Plan / ED Course  I have reviewed the triage vital signs and the nursing notes.  Pertinent labs & imaging results that were available during my care of the patient were reviewed by me and considered in my medical decision making (see chart for details).     Patient presents with fever of 104.4 and altered mental status.  Meets sepsis criteria.  Broad-spectrum antibiotics.  Fluid bolus given.  Patient be admitted to the hospital for what appears to be pyelonephritis.  No obstructing stone found on CT imaging.   Mild bilateral hydroureteronephrosis likely related to infection.  Mental status clearing in  the emergency department.  Final Clinical Impressions(s) / ED Diagnoses   Final diagnoses:  Sepsis, due to unspecified organism [facility])  Pyelonephritis    ED Discharge Orders    None       Azalia Bilis, MD 03/21/18 Lisabeth Register    Azalia Bilis, MD 04/01/18 [phone]

## 2018-03-21 NOTE — ED Triage Notes (Signed)
Pt to [facility] via EMS from urgent care. UA completed at urgent care positive for protein, WBC, RBC. and pt is c/o bilat flank pain

## 2018-03-21 NOTE — ED Notes (Signed)
Bed: [id number] Expected date:  Expected time:  Means of arrival:  Comments: EMS-flank pain/UTI??

## 2018-03-21 NOTE — Progress Notes (Signed)
A consult was received from an ED physician for vanc/cefepime per pharmacy dosing.  The patient's profile has been reviewed for ht/wt/allergies/indication/available labs.   A one time order has been placed for vanc 1g and cefepime 2g.  Further antibiotics/pharmacy consults should be ordered by admitting physician if indicated.                       Thank you, Berkley HarveyLegge, Iman Orourke Marshall 03/21/2018  12:30 PM

## 2018-03-21 NOTE — ED Notes (Signed)
Pt very confused during assessment. Denies HX of Dementia. Pt unable to provide meaningful information during assessment or answer questions at this time. Pt reports she lives alone.

## 2018-03-21 NOTE — ED Notes (Signed)
Family at bedside. 

## 2018-03-21 NOTE — Consult Note (Signed)
Urology Consult  Referring physician: Reggy Eye Reason for referral: ? UTI  Chief Complaint: ? UTI  History of Present Illness: Admitted to the hospital with a high fever chills and cloudy urine.  Asked to see patient to review CT scan findings; IV antibiotic started and cultures pending  CT Nonspecific perinephric fat stranding and mild bilateral hydroureteronephrosis without nephrolithiasis nor ureteral calculi. 2. Slight posterior mucosal thickening suggested of the bladder wall more focally eccentrically thickened adjacent to the left UVJ measuring 12 x 7 mm. A mucosal lesion, clot or focal inflammatory thickening might account for this appearance. Clinical correlation and if necessary, direct visual correlation is recommended.  WBC and Cr normal  Patient was having some cloudy urine and little bit of pain at the end of urination a few days ago but otherwise was doing well.  She has not had bladder surgery.  Years ago she used to get bladder infections.  She is never had a kidney stone  Modifying factors: There are no other modifying factors  Associated signs and symptoms: There are no other associated signs and symptoms Aggravating and relieving factors: There are no other aggravating or relieving factors Severity: Moderate Duration: Persistent     Past Medical History:  Diagnosis Date  . Anxiety   . Asthma   . Diabetes ([place])   . Diabetes mellitus without complication ([place])   . Diverticulitis   . GERD (gastroesophageal reflux disease)   . HTN (hypertension)   . Hypercholesteremia   . Hyperlipidemia   . Hypertension   . Obesity   . Rosacea   . Thyroid disease    Past Surgical History:  Procedure Laterality Date  . ACHILLES TENDON REPAIR    . APPENDECTOMY    . CESAREAN SECTION    . CHOLECYSTECTOMY    . hammertoe      Medications: I have reviewed the patient's current medications. Allergies:  Allergies  Allergen Reactions  . Ace Inhibitors Cough  .  Ciprofloxacin Other (See Comments)    Tendinitis in heel and posterior right LE  . Doxycycline Other (See Comments)    Pt does not remember what the reaction was - many years ago  . Shrimp [Shellfish Allergy] Diarrhea, Nausea And Vomiting and Rash    Family History  Problem Relation Age of Onset  . Hypertension Mother   . Heart failure Mother   . Hypotension Father   . Dementia Father    Social History:  reports that she has never smoked. She has never used smokeless tobacco. She reports that she does not drink alcohol or use drugs.  ROS: All systems are reviewed and negative except as noted. Rest negative  Physical Exam:  Vital signs in last 24 hours: Temp:  [100.6 F (38.1 C)-104.4 F (40.2 C)] 100.6 F (38.1 C) (08/23 1608) Pulse Rate:  [95-107] 97 (08/23 1515) Resp:  [18-26] 23 (08/23 1530) BP: (130-157)/(58-65) 130/59 (08/23 1530) SpO2:  [94 %-100 %] 95 % (08/23 1515) Weight:  [79.4 kg] 79.4 kg (08/23 1220)  Cardiovascular: Skin warm; not flushed Respiratory: Breaths quiet; no shortness of breath Abdomen: No masses Neurological: Normal sensation to touch Musculoskeletal: Normal motor function arms and legs Lymphatics: No inguinal adenopathy Skin: No rashes Genitourinary: Nontoxic  Laboratory Data:  Results for orders placed or performed during the hospital encounter of 03/21/18 (from the past 72 hour(s))  CBC with Differential/Platelet     Status: Abnormal   Collection Time: 03/21/18 12:28 PM  Result Value Ref Range  WBC 8.9 4.0 - 10.5 K/uL   RBC 3.83 (L) 3.87 - 5.11 MIL/uL   Hemoglobin 12.5 12.0 - 15.0 g/dL   HCT 36.5 36.0 - 46.0 %   MCV 95.3 78.0 - 100.0 fL   MCH 32.6 26.0 - 34.0 pg   MCHC 34.2 30.0 - 36.0 g/dL   RDW 13.1 11.5 - 15.5 %   Platelets 191 150 - 400 K/uL   Neutrophils Relative % 93 %   Neutro Abs 8.3 (H) 1.7 - 7.7 K/uL   Lymphocytes Relative 3 %   Lymphs Abs 0.3 (L) 0.7 - 4.0 K/uL   Monocytes Relative 3 %   Monocytes Absolute 0.2 0.1 -  1.0 K/uL   Eosinophils Relative 1 %   Eosinophils Absolute 0.1 0.0 - 0.7 K/uL   Basophils Relative 0 %   Basophils Absolute 0.0 0.0 - 0.1 K/uL    Comment: Performed at [facility], [place] [address] [place], [place] [address]  Comprehensive metabolic panel     Status: Abnormal   Collection Time: 03/21/18 12:28 PM  Result Value Ref Range   Sodium 138 135 - 145 mmol/L   Potassium 4.1 3.5 - 5.1 mmol/L   Chloride 103 98 - 111 mmol/L   CO2 27 22 - 32 mmol/L   Glucose, Bld 137 (H) 70 - 99 mg/dL   BUN 14 8 - 23 mg/dL   Creatinine, Ser 0.75 0.44 - 1.00 mg/dL   Calcium 9.5 8.9 - 10.3 mg/dL   Total Protein 7.2 6.5 - 8.1 g/dL   Albumin 4.0 3.5 - 5.0 g/dL   AST 21 15 - 41 U/L   ALT 14 0 - 44 U/L   Alkaline Phosphatase 53 38 - 126 U/L   Total Bilirubin 1.0 0.3 - 1.2 mg/dL   GFR calc non Af Amer >60 >60 mL/min   GFR calc Af Amer >60 >60 mL/min    Comment: (NOTE) The eGFR has been calculated using the CKD EPI equation. This calculation has not been validated in all clinical situations. eGFR's persistently <60 mL/min signify possible Chronic Kidney Disease.    Anion gap 8 5 - 15    Comment: Performed at [facility], [place] [address], [place], [place] [address]  Lipase, blood     Status: None   Collection Time: 03/21/18 12:28 PM  Result Value Ref Range   Lipase 19 11 - 51 U/L    Comment: Performed at [facility], [place] [name], [place], [place] [address]  Urinalysis, Routine w reflex microscopic     Status: Abnormal   Collection Time: 03/21/18 12:28 PM  Result Value Ref Range   Color, Urine STRAW (A) YELLOW   APPearance HAZY (A) CLEAR   Specific Gravity, Urine 1.006 1.005 - 1.030   pH 6.0 5.0 - 8.0   Glucose, UA NEGATIVE NEGATIVE mg/dL   Hgb urine dipstick SMALL (A) NEGATIVE   Bilirubin Urine NEGATIVE NEGATIVE   Ketones, ur NEGATIVE NEGATIVE mg/dL   Protein, ur NEGATIVE NEGATIVE mg/dL   Nitrite NEGATIVE NEGATIVE   Leukocytes,  UA LARGE (A) NEGATIVE   RBC / HPF 0-5 0 - 5 RBC/hpf   WBC, UA >50 (H) 0 - 5 WBC/hpf   Bacteria, UA NONE SEEN NONE SEEN    Comment: Performed at [facility], [place] [address] [place], [place] [address]  I-Stat CG4 Lactic Acid, ED  (not at  [facility])     Status: None   Collection Time: 03/21/18  12:39 PM  Result Value Ref Range   Lactic Acid, Venous 1.78 0.5 - 1.9 mmol/L  I-Stat CG4 Lactic Acid, ED  (not at  [facility])     Status: None   Collection Time: 03/21/18  3:29 PM  Result Value Ref Range   Lactic Acid, Venous 0.89 0.5 - 1.9 mmol/L   No results found for this or any previous visit (from the past 240 hour(s)). Creatinine: Recent Labs    03/21/18 1228  CREATININE 0.75    Xrays: See report/chart Hydroureter pretty symmetric as well  Impression/Assessment:  The clinical diagnosis appears to be pyelonephritis.  She does not need a stent.  I gave her my phone number and we should follow her as an outpatient.  I would perform cystoscopy in the future to make certain that the bladder is completely normal.  Please await positive cultures before sending home on oral antibiotic.  Reconsult urology if any issues over the weekend  Plan:  As above  Lucila Klecka A 03/21/2018, 5:38 PM

## 2018-03-21 NOTE — H&P (Signed)
History and Physical    Kristina [name]  ZOX:[id number]  DOB: 07/28/1946  DOA: 03/21/2018 PCP: Shaune PollackGates, Donna, MD   Patient coming from: home  Chief Complaint: fever  HPI: Kristina SaxLorraine T [name] is a 72 y.o. female with medical history of HTN, IBS, DM, HLD, who noticed her urine was cloudy and went to the Urgent care today. While she was there she began to have chills and shakes. She subsequently was told to come to the ED where she was noted to have a fever of 104.4. She has no dysuria or suprapubic pain. No flank pain either. No h/o frequent UTIs.   ED Course: fever 104.4, pulse > 107- given Vanc, Cefepime and Flagyl  Review of Systems:  All other systems reviewed and apart from HPI, are negative.  Past Medical History:  Diagnosis Date  . Anxiety   . Asthma   . Diabetes ([facility])   . Diabetes mellitus without complication ([facility])   . Diverticulitis   . GERD (gastroesophageal reflux disease)   . HTN (hypertension)   . Hypercholesteremia   . Hyperlipidemia   . Hypertension   . Obesity   . Rosacea   . Thyroid disease     Past Surgical History:  Procedure Laterality Date  . ACHILLES TENDON REPAIR    . APPENDECTOMY    . CESAREAN SECTION    . CHOLECYSTECTOMY    . hammertoe      Social History:   reports that she has never smoked. She has never used smokeless tobacco. She reports that she does not drink alcohol or use drugs.  She lives alone.   Allergies  Allergen Reactions  . Ace Inhibitors Cough  . Ciprofloxacin Other (See Comments)    Tendinitis in heel and posterior right LE  . Doxycycline Other (See Comments)    Pt does not remember what the reaction was - many years ago  . Shrimp [Shellfish Allergy] Diarrhea, Nausea And Vomiting and Rash    Family History  Problem Relation Age of Onset  . Hypertension Mother   . Heart failure Mother   . Hypotension Father   . Dementia Father      Prior to Admission medications   Medication Sig Start Date End Date Taking?  Authorizing Provider  acetaminophen (TYLENOL) 500 MG tablet Take 1,000 mg by mouth 2 (two) times daily as needed (pain).   Yes [provider]  amLODipine (NORVASC) 5 MG tablet Take 5 mg by mouth daily. 01/01/18  Yes [provider]  aspirin 81 MG tablet Take 81 mg by mouth daily.   Yes [provider]  BYSTOLIC 10 MG tablet TAKE 1 TABLET (10 MG TOTAL) BY MOUTH DAILY. Patient taking differently: Take 10 mg by mouth daily.  07/15/15  Yes Nyoka CowdenWert, Michael B, MD  CALCIUM PO Take 500 mg by mouth at bedtime.   Yes [provider]  hyoscyamine (LEVSIN SL) 0.125 MG SL tablet Place 0.125 mg under the tongue 2 (two) times daily.    Yes [provider]  insulin glargine (LANTUS) 100 unit/mL SOPN Inject 18 Units into the skin at bedtime.    Yes [provider]  levothyroxine (SYNTHROID, LEVOTHROID) 25 MCG tablet Take 25 mcg by mouth daily before breakfast.   Yes [provider]  losartan (COZAAR) 100 MG tablet Take 100 mg by mouth daily.   Yes [provider]  metFORMIN (GLUCOPHAGE-XR) 500 MG 24 hr tablet Take 2,000 mg by mouth daily. 12/24/17  Yes [provider]  simvastatin (ZOCOR) 20 MG tablet Take 1 tablet by mouth daily. 08/05/13  Yes [provider]  tiZANidine (ZANAFLEX) 4 MG tablet Take 4 mg by mouth 3 (three) times daily as needed for muscle spasms.   Yes [provider]  amoxicillin-clavulanate (AUGMENTIN) 875-125 MG tablet Take 1 tablet by mouth 2 (two) times daily. Patient not taking: Reported on 03/21/2018 12/21/15   Nyoka Cowden, MD  famotidine (PEPCID) 20 MG tablet One after breakdfast and One at bedtime Patient not taking: Reported on 03/21/2018 02/18/15   Nyoka Cowden, MD  mometasone-formoterol [facility]) 100-5 MCG/ACT AERO Take 2 puffs first thing in am and then another 2 puffs about 12 hours later. Patient not taking: Reported on 03/21/2018 12/21/15   Nyoka Cowden, MD    Physical Exam: Wt  Readings from Last 3 Encounters:  03/21/18 79.4 kg  06/27/17 80.7 kg  03/14/17 81.6 kg   Vitals:   03/21/18 1504 03/21/18 1515 03/21/18 1530 03/21/18 1608  BP: (!) 131/58  (!) 130/59   Pulse: 96 97    Resp: (!) 26 (!) 22 (!) 23   Temp:    (!) 100.6 F (38.1 C)  TempSrc:    Oral  SpO2: 96% 95%    Weight:      Height:          Constitutional:  Calm & comfortable Eyes: PERRLA, lids and conjunctivae normal ENT:  Mucous membranes are moist.  Pharynx clear of exudate   Normal dentition.  Neck: Supple, no masses  Respiratory:  Clear to auscultation bilaterally  Normal respiratory effort.  Cardiovascular:  S1 & S2 heard, regular rate and rhythm No Murmurs Abdomen:  Non distended No tenderness, No masses Bowel sounds normal Extremities:  No clubbing / cyanosis No pedal edema No joint deformity    Skin:  No rashes, lesions or ulcers Neurologic:  AAO x 3 CN 2-12 grossly intact Sensation intact Strength 5/5 in all 4 extremities Psychiatric:  Normal Mood and affect    Labs on Admission: I have personally reviewed following labs and imaging studies  CBC: Recent Labs  Lab 03/21/18 1228  WBC 8.9  NEUTROABS 8.3*  HGB 12.5  HCT 36.5  MCV 95.3  PLT 191   Basic Metabolic Panel: Recent Labs  Lab 03/21/18 1228  NA 138  K 4.1  CL 103  CO2 27  GLUCOSE 137*  BUN 14  CREATININE 0.75  CALCIUM 9.5   GFR: Estimated Creatinine Clearance: 63.9 mL/min (by C-G formula based on SCr of 0.75 mg/dL). Liver Function Tests: Recent Labs  Lab 03/21/18 1228  AST 21  ALT 14  ALKPHOS 53  BILITOT 1.0  PROT 7.2  ALBUMIN 4.0   Recent Labs  Lab 03/21/18 1228  LIPASE 19   No results for input(s): AMMONIA in the last 168 hours. Coagulation Profile: No results for input(s): INR, PROTIME in the last 168 hours. Cardiac Enzymes: No results for input(s): CKTOTAL, CKMB, CKMBINDEX, TROPONINI in the last 168 hours. BNP (last 3 results) No results for input(s): PROBNP in  the last 8760 hours. HbA1C: No results for input(s): HGBA1C in the last 72 hours. CBG: No results for input(s): GLUCAP in the last 168 hours. Lipid Profile: No results for input(s): CHOL, HDL, LDLCALC, TRIG, CHOLHDL, LDLDIRECT in the last 72 hours. Thyroid Function Tests: No results for input(s): TSH, T4TOTAL, FREET4, T3FREE, THYROIDAB in the last 72 hours. Anemia Panel: No results for input(s): VITAMINB12, FOLATE, FERRITIN, TIBC, IRON, RETICCTPCT in the last 72  hours. Urine analysis:    Component Value Date/Time   COLORURINE STRAW (A) 03/21/2018 1228   APPEARANCEUR HAZY (A) 03/21/2018 1228   LABSPEC 1.006 03/21/2018 1228   PHURINE 6.0 03/21/2018 1228   GLUCOSEU NEGATIVE 03/21/2018 1228   HGBUR SMALL (A) 03/21/2018 1228   BILIRUBINUR NEGATIVE 03/21/2018 1228   KETONESUR NEGATIVE 03/21/2018 1228   PROTEINUR NEGATIVE 03/21/2018 1228   UROBILINOGEN 0.2 05/01/2015 1551   NITRITE NEGATIVE 03/21/2018 1228   LEUKOCYTESUR LARGE (A) 03/21/2018 1228   Sepsis Labs: @LABRCNTIP (procalcitonin:4,lacticidven:4) )No results found for this or any previous visit (from the past 240 hour(s)).   Radiological Exams on Admission: Dg Chest Portable 1 View  Result Date: 03/21/2018 CLINICAL DATA:  Fever and vomiting EXAM: PORTABLE CHEST 1 VIEW COMPARISON:  May 01, 2015 FINDINGS: No edema or consolidation. Heart is upper normal in size with pulmonary vascularity normal. No adenopathy. There is aortic atherosclerosis. No evident bone lesions. IMPRESSION: Aortic atherosclerosis. No edema or consolidation. Heart upper normal in size. Aortic Atherosclerosis (ICD10-I70.0). Electronically Signed   By: Bretta Bang III M.D.   On: 03/21/2018 12:50   Ct Renal Stone Study  Result Date: 03/21/2018 CLINICAL DATA:  Bilateral flank pain and abnormal urinalysis. EXAM: CT ABDOMEN AND PELVIS WITHOUT CONTRAST TECHNIQUE: Multidetector CT imaging of the abdomen and pelvis was performed following the standard protocol  without IV contrast. COMPARISON:  02/11/2010 FINDINGS: Lower chest: Top-normal size heart without pericardial effusion or thickening. Minimal bibasilar atelectasis. No effusion or pneumothorax. Hepatobiliary: Cholecystectomy.  Unenhanced liver is unremarkable. Pancreas: Atrophic without ductal dilatation or space-occupying mass given limitations of a noncontrast study. Spleen:  Normal size spleen. Adrenals/Urinary Tract: The adrenal glands are normal in size and appearance. Mild nonspecific perinephric fat stranding is identified about both kidneys, left greater than right with mild bilateral hydroureteronephrosis. No obstructing calculus is identified however. Suggestion of mild posterior bladder wall mucosal thickening is noted, more focally thickened near the left UVJ measuring 12 x 7 mm. Although findings may be post inflammatory or infectious in etiology, a mucosal lesion or potentially a layering debris or clot are not entirely excluded. Clinical correlation and if necessary direct visual correlation may prove useful. Stomach/Bowel: Small hiatal hernia. Decompressed stomach. Normal duodenal sweep and ligament of Treitz position. No bowel obstruction or inflammation. Moderate amount of fecal retention is noted within the colon with scattered colonic diverticulosis but without acute diverticulitis. Vascular/Lymphatic: Mild-to-moderate aortoiliac atherosclerosis. No aneurysm. Reproductive: The uterus and adnexa are unremarkable. Other: No free air nor free fluid. Musculoskeletal: Moderate degenerative disc flattening L2-3 and moderate at L3-4. No acute nor aggressive osseous lesions. IMPRESSION: 1. Nonspecific perinephric fat stranding and mild bilateral hydroureteronephrosis without nephrolithiasis nor ureteral calculi. 2. Slight posterior mucosal thickening suggested of the bladder wall more focally eccentrically thickened adjacent to the left UVJ measuring 12 x 7 mm. A mucosal lesion, clot or focal  inflammatory thickening might account for this appearance. Clinical correlation and if necessary, direct visual correlation is recommended. 3. Colonic diverticulosis without acute diverticulitis. 4. Small hiatal hernia. 5. 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 Electronically Signed   By: Tollie Eth M.D.   On: 03/21/2018 15:22    EKG: Independently reviewed. Mild sinus tachycardia   Assessment/Plan Principal Problem:   Pyelonephritis - interestingly,no bacteremia noted on UA but she has large leukocytes - presents with rigors, high fever and changes on CT suggestive of pyelonephritis but also mild b/l hydronephrosis and bladder wall prominence- I have asked Dr Sherron Monday for an opinion -  blood and urine cultures ordered - she has received Vanc, Cefepime and Flagyl in the ED- I will order Ceftriaxone to cover for a UTI  - about 3 L NS given in ED- hold off on further IV fluids  Active Problems:   Essential hypertension - Norvasc, Cozaar and Bystolic    Diabetes mellitus type 2, controlled  - cont Lantus at lower dose and SSI    IBS (irritable bowel syndrome) - cont Levsin  Hypothyroid - cont Synthroid   Diverticulosis - seen on CT- discussed with patient   DVT prophylaxis: Lovenox Code Status: Full code  Family Communication: son at bedside  Disposition Plan:   Consults called: urology  Admission status: inpatient    Calvert Cantor MD [facility] Pager: [email or web address] Password [email or web address] 7PM-7AM, please contact night-coverage   03/21/2018, 5:10 PM

## 2018-03-21 NOTE — ED Notes (Signed)
Campos MD made aware of pts status

## 2018-03-21 NOTE — ED Notes (Signed)
Informed by EMS pt received ROCEPHIN 1 MG IM at urgent care, and recieved 4mg  zofran IV in route to hospital.

## 2018-03-21 NOTE — ED Notes (Signed)
ED TO INPATIENT HANDOFF REPORT  Name/Age/Gender Kristina [name] 72 y.o. female  Code Status    Code Status Orders  (From admission, onward)         Start     Ordered   03/21/18 1700  Full code  Continuous     03/21/18 1700        Code Status History    Date Active Date Inactive Code Status Order ID Comments User Context   05/01/2015 1703 05/02/2015 2150 Full Code [id number]  Reyne Dumas, MD ED      Home/SNF/Other Home  Chief Complaint Pyelonephritis [N12] Sepsis, due to unspecified organism ( [place]) [A41.9]  Level of Care/Admitting Diagnosis ED Disposition    ED Disposition Condition [place]: [facility] [[id number]]  Level of Care: Med-Surg [16]  Diagnosis: UTI (urinary tract infection) [580998]  Admitting Physician: [place], [place]  Attending Physician: Debbe Odea [[id number]]  Estimated length of stay: past midnight tomorrow  Certification:: I certify this patient will need inpatient services for at least 2 midnights  PT Class (Do Not Modify): Inpatient [101]  PT Acc Code (Do Not Modify): Private [1]       Medical History Past Medical History:  Diagnosis Date  . Anxiety   . Asthma   . Diabetes ([place])   . Diabetes mellitus without complication ([place])   . Diverticulitis   . GERD (gastroesophageal reflux disease)   . HTN (hypertension)   . Hypercholesteremia   . Hyperlipidemia   . Hypertension   . Obesity   . Rosacea   . Thyroid disease     Allergies Allergies  Allergen Reactions  . Ace Inhibitors Cough  . Ciprofloxacin Other (See Comments)    Tendinitis in heel and posterior right LE  . Doxycycline Other (See Comments)    Pt does not remember what the reaction was - many years ago  . Shrimp [Shellfish Allergy] Diarrhea, Nausea And Vomiting and Rash    IV Location/Drains/Wounds Patient Lines/Drains/Airways Status   Active Line/Drains/Airways    Name:   Placement date:   Placement time:   Site:   Days:    Peripheral IV Left Antecubital   -    -    Antecubital      Peripheral IV 03/21/18 Right Forearm   03/21/18    1252    Forearm   less than 1          Labs/Imaging Results for orders placed or performed during the hospital encounter of 03/21/18 (from the past 48 hour(s))  CBC with Differential/Platelet     Status: Abnormal   Collection Time: 03/21/18 12:28 PM  Result Value Ref Range   WBC 8.9 4.0 - 10.5 K/uL   RBC 3.83 (L) 3.87 - 5.11 MIL/uL   Hemoglobin 12.5 12.0 - 15.0 g/dL   HCT 36.5 36.0 - 46.0 %   MCV 95.3 78.0 - 100.0 fL   MCH 32.6 26.0 - 34.0 pg   MCHC 34.2 30.0 - 36.0 g/dL   RDW 13.1 11.5 - 15.5 %   Platelets 191 150 - 400 K/uL   Neutrophils Relative % 93 %   Neutro Abs 8.3 (H) 1.7 - 7.7 K/uL   Lymphocytes Relative 3 %   Lymphs Abs 0.3 (L) 0.7 - 4.0 K/uL   Monocytes Relative 3 %   Monocytes Absolute 0.2 0.1 - 1.0 K/uL   Eosinophils Relative 1 %   Eosinophils Absolute 0.1 0.0 - 0.7 K/uL  Basophils Relative 0 %   Basophils Absolute 0.0 0.0 - 0.1 K/uL    Comment: Performed at [facility], [place] [address], [place], [place] [address]  Comprehensive metabolic panel     Status: Abnormal   Collection Time: 03/21/18 12:28 PM  Result Value Ref Range   Sodium 138 135 - 145 mmol/L   Potassium 4.1 3.5 - 5.1 mmol/L   Chloride 103 98 - 111 mmol/L   CO2 27 22 - 32 mmol/L   Glucose, Bld 137 (H) 70 - 99 mg/dL   BUN 14 8 - 23 mg/dL   Creatinine, Ser 0.75 0.44 - 1.00 mg/dL   Calcium 9.5 8.9 - 10.3 mg/dL   Total Protein 7.2 6.5 - 8.1 g/dL   Albumin 4.0 3.5 - 5.0 g/dL   AST 21 15 - 41 U/L   ALT 14 0 - 44 U/L   Alkaline Phosphatase 53 38 - 126 U/L   Total Bilirubin 1.0 0.3 - 1.2 mg/dL   GFR calc non Af Amer >60 >60 mL/min   GFR calc Af Amer >60 >60 mL/min    Comment: (NOTE) The eGFR has been calculated using the CKD EPI equation. This calculation has not been validated in all clinical situations. eGFR's persistently <60 mL/min signify possible Chronic  Kidney Disease.    Anion gap 8 5 - 15    Comment: Performed at [facility], [place] [address], [place], [place] [address]  Lipase, blood     Status: None   Collection Time: 03/21/18 12:28 PM  Result Value Ref Range   Lipase 19 11 - 51 U/L    Comment: Performed at [facility], [place] [address] [place], [place] [address]  Urinalysis, Routine w reflex microscopic     Status: Abnormal   Collection Time: 03/21/18 12:28 PM  Result Value Ref Range   Color, Urine STRAW (A) YELLOW   APPearance HAZY (A) CLEAR   Specific Gravity, Urine 1.006 1.005 - 1.030   pH 6.0 5.0 - 8.0   Glucose, UA NEGATIVE NEGATIVE mg/dL   Hgb urine dipstick SMALL (A) NEGATIVE   Bilirubin Urine NEGATIVE NEGATIVE   Ketones, ur NEGATIVE NEGATIVE mg/dL   Protein, ur NEGATIVE NEGATIVE mg/dL   Nitrite NEGATIVE NEGATIVE   Leukocytes, UA LARGE (A) NEGATIVE   RBC / HPF 0-5 0 - 5 RBC/hpf   WBC, UA >50 (H) 0 - 5 WBC/hpf   Bacteria, UA NONE SEEN NONE SEEN    Comment: Performed at [facility], [place] [address], [place], [place] [address]  I-Stat CG4 Lactic Acid, ED  (not at  [facility])     Status: None   Collection Time: 03/21/18 12:39 PM  Result Value Ref Range   Lactic Acid, Venous 1.78 0.5 - 1.9 mmol/L  I-Stat CG4 Lactic Acid, ED  (not at  [facility])     Status: None   Collection Time: 03/21/18  3:29 PM  Result Value Ref Range   Lactic Acid, Venous 0.89 0.5 - 1.9 mmol/L   Dg Chest Portable 1 View  Result Date: 03/21/2018 CLINICAL DATA:  Fever and vomiting EXAM: PORTABLE CHEST 1 VIEW COMPARISON:  May 01, 2015 FINDINGS: No edema or consolidation. Heart is upper normal in size with pulmonary vascularity normal. No adenopathy. There is aortic atherosclerosis. No evident bone lesions. IMPRESSION: Aortic atherosclerosis. No edema or consolidation. Heart upper normal in size. Aortic Atherosclerosis (ICD10-I70.0). Electronically Signed   By: Lowella Grip III M.D.   On:  03/21/2018  12:50   Ct Renal Stone Study  Result Date: 03/21/2018 CLINICAL DATA:  Bilateral flank pain and abnormal urinalysis. EXAM: CT ABDOMEN AND PELVIS WITHOUT CONTRAST TECHNIQUE: Multidetector CT imaging of the abdomen and pelvis was performed following the standard protocol without IV contrast. COMPARISON:  02/11/2010 FINDINGS: Lower chest: Top-normal size heart without pericardial effusion or thickening. Minimal bibasilar atelectasis. No effusion or pneumothorax. Hepatobiliary: Cholecystectomy.  Unenhanced liver is unremarkable. Pancreas: Atrophic without ductal dilatation or space-occupying mass given limitations of a noncontrast study. Spleen:  Normal size spleen. Adrenals/Urinary Tract: The adrenal glands are normal in size and appearance. Mild nonspecific perinephric fat stranding is identified about both kidneys, left greater than right with mild bilateral hydroureteronephrosis. No obstructing calculus is identified however. Suggestion of mild posterior bladder wall mucosal thickening is noted, more focally thickened near the left UVJ measuring 12 x 7 mm. Although findings may be post inflammatory or infectious in etiology, a mucosal lesion or potentially a layering debris or clot are not entirely excluded. Clinical correlation and if necessary direct visual correlation may prove useful. Stomach/Bowel: Small hiatal hernia. Decompressed stomach. Normal duodenal sweep and ligament of Treitz position. No bowel obstruction or inflammation. Moderate amount of fecal retention is noted within the colon with scattered colonic diverticulosis but without acute diverticulitis. Vascular/Lymphatic: Mild-to-moderate aortoiliac atherosclerosis. No aneurysm. Reproductive: The uterus and adnexa are unremarkable. Other: No free air nor free fluid. Musculoskeletal: Moderate degenerative disc flattening L2-3 and moderate at L3-4. No acute nor aggressive osseous lesions. IMPRESSION: 1. Nonspecific perinephric fat stranding and mild  bilateral hydroureteronephrosis without nephrolithiasis nor ureteral calculi. 2. Slight posterior mucosal thickening suggested of the bladder wall more focally eccentrically thickened adjacent to the left UVJ measuring 12 x 7 mm. A mucosal lesion, clot or focal inflammatory thickening might account for this appearance. Clinical correlation and if necessary, direct visual correlation is recommended. 3. Colonic diverticulosis without acute diverticulitis. 4. Small hiatal hernia. 5. 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 Electronically Signed   By: Ashley Royalty M.D.   On: 03/21/2018 15:22    Pending Labs Unresulted Labs (From admission, onward)    Start     Ordered   03/28/18 0500  Creatinine, serum  (enoxaparin (LOVENOX)    CrCl >/= 30 ml/min)  Weekly,   R    Comments:  while on enoxaparin therapy    03/21/18 1700   03/22/18 2130  Basic metabolic panel  Tomorrow morning,   R     03/21/18 1700   03/22/18 0500  CBC  Tomorrow morning,   R     03/21/18 1700   03/21/18 1659  CBC  (enoxaparin (LOVENOX)    CrCl >/= 30 ml/min)  Once,   R    Comments:  Baseline for enoxaparin therapy IF NOT ALREADY DRAWN.  Notify MD if PLT < 100 K.    03/21/18 1700   03/21/18 1659  Creatinine, serum  (enoxaparin (LOVENOX)    CrCl >/= 30 ml/min)  Once,   R    Comments:  Baseline for enoxaparin therapy IF NOT ALREADY DRAWN.    03/21/18 1700   03/21/18 1228  Blood Culture (routine x 2)  BLOOD CULTURE X 2,   STAT     03/21/18 1228   03/21/18 1228  Urine culture  STAT,   STAT     03/21/18 1228          Vitals/Pain Today's Vitals   03/21/18 1504 03/21/18 1515 03/21/18 1530 03/21/18 1608  BP: Marland Kitchen)  131/58  (!) 130/59   Pulse: 96 97    Resp: (!) 26 (!) 22 (!) 23   Temp:    (!) 100.6 F (38.1 C)  TempSrc:    Oral  SpO2: 96% 95%    Weight:      Height:      PainSc:        Isolation Precautions No active isolations  Medications Medications  ondansetron (ZOFRAN) injection 4 mg (0 mg Intravenous Hold  03/21/18 1325)  morphine 4 MG/ML injection 4 mg (0 mg Intravenous Hold 03/21/18 1325)  metroNIDAZOLE (FLAGYL) IVPB 500 mg (0 mg Intravenous Stopped 03/21/18 1506)  amLODipine (NORVASC) tablet 5 mg (has no administration in time range)  aspirin tablet 81 mg (has no administration in time range)  nebivolol (BYSTOLIC) tablet 10 mg (has no administration in time range)  hyoscyamine (LEVSIN SL) SL tablet 0.125 mg (has no administration in time range)  insulin glargine (LANTUS) Solostar Pen 12 Units (has no administration in time range)  levothyroxine (SYNTHROID, LEVOTHROID) tablet 25 mcg (has no administration in time range)  losartan (COZAAR) tablet 100 mg (has no administration in time range)  simvastatin (ZOCOR) tablet 20 mg (has no administration in time range)  enoxaparin (LOVENOX) injection 40 mg (has no administration in time range)  sodium chloride flush (NS) 0.9 % injection 3 mL (has no administration in time range)  sodium chloride flush (NS) 0.9 % injection 3 mL (has no administration in time range)  0.9 %  sodium chloride infusion (has no administration in time range)  acetaminophen (TYLENOL) tablet 650 mg (has no administration in time range)    Or  acetaminophen (TYLENOL) suppository 650 mg (has no administration in time range)  ondansetron (ZOFRAN) tablet 4 mg (has no administration in time range)    Or  ondansetron (ZOFRAN) injection 4 mg (has no administration in time range)  insulin aspart (novoLOG) injection 0-15 Units (has no administration in time range)  insulin aspart (novoLOG) injection 0-5 Units (has no administration in time range)  sodium chloride 0.9 % bolus 1,000 mL (0 mLs Intravenous Stopped 03/21/18 1506)  sodium chloride 0.9 % bolus 1,000 mL (0 mLs Intravenous Stopped 03/21/18 1621)    And  sodium chloride 0.9 % bolus 1,000 mL (0 mLs Intravenous Stopped 03/21/18 1621)    And  sodium chloride 0.9 % bolus 500 mL (0 mLs Intravenous Stopped 03/21/18 1621)  ceFEPIme  (MAXIPIME) 2 g in sodium chloride 0.9 % 100 mL IVPB (0 g Intravenous Stopped 03/21/18 1346)  vancomycin (VANCOCIN) IVPB 1000 mg/200 mL premix (0 mg Intravenous Stopped 03/21/18 1621)    Mobility walks

## 2018-03-21 NOTE — ED Notes (Signed)
EDP at bedside  

## 2018-03-22 DIAGNOSIS — Z794 Long term (current) use of insulin: Secondary | ICD-10-CM

## 2018-03-22 DIAGNOSIS — N10 Acute tubulo-interstitial nephritis: Secondary | ICD-10-CM | POA: Diagnosis present

## 2018-03-22 DIAGNOSIS — E118 Type 2 diabetes mellitus with unspecified complications: Secondary | ICD-10-CM

## 2018-03-22 DIAGNOSIS — K580 Irritable bowel syndrome with diarrhea: Secondary | ICD-10-CM

## 2018-03-22 DIAGNOSIS — N12 Tubulo-interstitial nephritis, not specified as acute or chronic: Secondary | ICD-10-CM

## 2018-03-22 DIAGNOSIS — I10 Essential (primary) hypertension: Secondary | ICD-10-CM

## 2018-03-22 LAB — GLUCOSE, CAPILLARY
GLUCOSE-CAPILLARY: 74 mg/dL (ref 70–99)
GLUCOSE-CAPILLARY: 184 mg/dL — AB (ref 70–99)
GLUCOSE-CAPILLARY: 170 mg/dL — AB (ref 70–99)
Glucose-Capillary: 88 mg/dL (ref 70–99)
Glucose-Capillary: 149 mg/dL — ABNORMAL HIGH (ref 70–99)
Glucose-Capillary: 185 mg/dL — ABNORMAL HIGH (ref 70–99)

## 2018-03-22 LAB — BASIC METABOLIC PANEL
Anion gap: 6 (ref 5–15)
BUN: 8 mg/dL (ref 8–23)
CALCIUM: 8.7 mg/dL — AB (ref 8.9–10.3)
CO2: 26 mmol/L (ref 22–32)
Chloride: 109 mmol/L (ref 98–111)
Creatinine, Ser: 0.74 mg/dL (ref 0.44–1.00)
GFR calc Af Amer: >60 mL/min (ref >60)
GLUCOSE: 84 mg/dL (ref 70–99)
Potassium: 3.4 mmol/L — ABNORMAL LOW (ref 3.5–5.1)
SODIUM: 141 mmol/L (ref 135–145)

## 2018-03-22 LAB — URINE CULTURE: Culture: <10000 — AB

## 2018-03-22 LAB — CBC
HEMATOCRIT: 31.6 % — AB (ref 36.0–46.0)
Hemoglobin: 10.9 g/dL — ABNORMAL LOW (ref 12.0–15.0)
MCH: 33.2 pg (ref 26.0–34.0)
MCHC: 34.5 g/dL (ref 30.0–36.0)
MCV: 96.3 fL (ref 78.0–100.0)
PLATELETS: 164 10*3/uL (ref 150–400)
RBC: 3.28 MIL/uL — ABNORMAL LOW (ref 3.87–5.11)
RDW: 13.4 % (ref 11.5–15.5)
WBC: 8 10*3/uL (ref 4.0–10.5)

## 2018-03-22 LAB — MAGNESIUM: MAGNESIUM: 1.6 mg/dL — AB (ref 1.7–2.4)

## 2018-03-22 MED ORDER — INSULIN GLARGINE 100 UNIT/ML ~~LOC~~ SOLN
10.0000 [IU] | Freq: Every day | SUBCUTANEOUS | Status: DC
  Filled 2018-03-22: qty 0.1

## 2018-03-22 MED ORDER — MAGNESIUM SULFATE 4 GM/100ML IV SOLN
4.0000 g | Freq: Once | INTRAVENOUS | Status: AC
  Administered 2018-03-22: 4 g via INTRAVENOUS
  Filled 2018-03-22: qty 100

## 2018-03-22 MED ORDER — INSULIN ASPART 100 UNIT/ML ~~LOC~~ SOLN
0.0000 [IU] | Freq: Three times a day (TID) | SUBCUTANEOUS | Status: DC
  Administered 2018-03-22: 1 [IU] via SUBCUTANEOUS
  Administered 2018-03-23 – 2018-03-24 (×4): 2 [IU] via SUBCUTANEOUS

## 2018-03-22 MED ORDER — INSULIN GLARGINE 100 UNIT/ML ~~LOC~~ SOLN
6.0000 [IU] | Freq: Every day | SUBCUTANEOUS | Status: DC
  Filled 2018-03-22: qty 0.06

## 2018-03-22 MED ORDER — POTASSIUM CHLORIDE CRYS ER 20 MEQ PO TBCR
40.0000 meq | EXTENDED_RELEASE_TABLET | Freq: Once | ORAL | Status: AC
  Administered 2018-03-22: 40 meq via ORAL
  Filled 2018-03-22: qty 2

## 2018-03-22 NOTE — Progress Notes (Signed)
PROGRESS NOTE    [name]  JXB:[id number] DOB: 03/26/1946 DOA: 03/21/2018 PCP: Shaune Pollack, MD    Brief Narrative:  Kristina [name] is a 72 y.o. female with medical history of HTN, IBS, DM, HLD, who noticed her urine was cloudy and went to the Urgent care today. While she was there she began to have chills and shakes. She subsequently was told to come to the ED where she was noted to have a fever of 104.4. She has no dysuria or suprapubic pain. No flank pain either. No h/o frequent UTIs.   ED Course: fever 104.4, pulse > 107- given Vanc, Cefepime and Flagyl   Assessment & Plan:   Principal Problem:   Acute pyelonephritis Active Problems:   Essential hypertension   Diabetes mellitus type 2, controlled (HCC)   UTI (urinary tract infection)   IBS (irritable bowel syndrome)   Pyelonephritis  #1 acute pyelonephritis Urinalysis which was done on admission with large leukocytes.  CT renal stone protocol suggestive of pyelonephritis but also mild bilateral hydronephrosis and bladder wall prominence.  Patient has been pancultured results pending.  Fever curve is trended down.  Clinical improvement.  Continue empiric IV Rocephin.  Discontinue IV Flagyl.  Continue IV fluids.  Patient has been seen in consultation by urology, Dr. Sherron Monday who recommended continuation of IV antibiotics and outpatient follow-up for cystoscopy.  Also recommending awaiting culture results prior to transitioning to oral antibiotics.  Continue IV Rocephin.  Supportive care.  Follow.  2.  Well-controlled diabetes mellitus type 2 Patient states last hemoglobin A1c was 6.1 in May 2019.  Patient noted to have a CBG of 74 and was symptomatic.  CBG improved.  Patient noted to be on metformin and Lantus prior to admission.  Patient was placed on the reduced dose of her home Lantus.  Discontinue Lantus for now.  Continue sliding scale insulin.  Continue to hold oral hypoglycemic agent, metformin.  Check a hemoglobin  A1c.  3.  Hypertension Continue home regimen of Norvasc, Cozaar, Bystolic.  4.  IBS Stable.  Continue Levsin.  5.  Hypothyroidism Continue home regimen Synthroid.  Outpatient follow-up with PCP.  6.  Diverticulosis Seen on CT.  Outpatient follow-up.  7.  Hypokalemia/hypomagnesemia Magnesium level checked was 1.6.  We will give IV magnesium 4 g x 1.  K. Dur 40 mEq p.o. x1.  Repeat labs in the morning.  Keep magnesium greater than 2.  Keep potassium greater than 4.   DVT prophylaxis: Lovenox Code Status: Full Family Communication: Updated patient.  No family at bedside. Disposition Plan: Home when clinically improved and medically stable.   Consultants:   Urology: Dr. Sherron Monday 03/21/2018  Procedures:   CT renal stone study 03/21/2018  Chest x-ray 03/21/2018  Antimicrobials:   IV Rocephin 03/21/2018  IV Flagyl 03/21/2018>>>>> 03/22/2018   Subjective: Sitting up in bed eating her lunch.  Patient states had a low blood glucose level this morning and usually has a headache on the top of her head on her usual symptoms.  Patient states blood glucose was 74 this morning.  Patient denies any further chills or Reiger's.  Denies any bilateral CVA tenderness.  Feels better than she did on admission.  No shortness of breath.  No chest pain.  Objective: Vitals:   03/21/18 1741 03/21/18 2042 03/22/18 0529 03/22/18 1420  BP: (!) 137/56 136/63 129/63 (!) 143/64  Pulse: 95 85 71 74  Resp: 17 18 19 19   Temp: 100 F (37.8 C) 99.9 F (  37.7 C) 99 F (37.2 C) 99.4 F (37.4 C)  TempSrc:  Oral Oral   SpO2: 97% 97% 94% 96%  Weight:      Height:        Intake/Output Summary (Last 24 hours) at 03/22/2018 1612 Last data filed at 03/22/2018 0600 Gross per 24 hour  Intake 380.18 ml  Output -  Net 380.18 ml   Filed Weights   03/21/18 1220  Weight: 79.4 kg    Examination:  General exam: Appears calm and comfortable  Respiratory system: Clear to auscultation. Respiratory effort  normal. Cardiovascular system: S1 & S2 heard, RRR. No JVD, murmurs, rubs, gallops or clicks. No pedal edema. Gastrointestinal system: Abdomen is nondistended, soft and nontender. No organomegaly or masses felt. Normal bowel sounds heard. Central nervous system: Alert and oriented. No focal neurological deficits. Extremities: Symmetric 5 x 5 power. Skin: No rashes, lesions or ulcers Psychiatry: Judgement and insight appear normal. Mood & affect appropriate.     Data Reviewed: I have personally reviewed following labs and imaging studies  CBC: Recent Labs  Lab 03/21/18 1228 03/22/18 0548  WBC 8.9 8.0  NEUTROABS 8.3*  --   HGB 12.5 10.9*  HCT 36.5 31.6*  MCV 95.3 96.3  PLT 191 164   Basic Metabolic Panel: Recent Labs  Lab 03/21/18 1228 03/22/18 0548  NA 138 141  K 4.1 3.4*  CL 103 109  CO2 27 26  GLUCOSE 137* 84  BUN 14 8  CREATININE 0.75 0.74  CALCIUM 9.5 8.7*  MG  --  1.6*   GFR: Estimated Creatinine Clearance: 63.9 mL/min (by C-G formula based on SCr of 0.74 mg/dL). Liver Function Tests: Recent Labs  Lab 03/21/18 1228  AST 21  ALT 14  ALKPHOS 53  BILITOT 1.0  PROT 7.2  ALBUMIN 4.0   Recent Labs  Lab 03/21/18 1228  LIPASE 19   No results for input(s): AMMONIA in the last 168 hours. Coagulation Profile: No results for input(s): INR, PROTIME in the last 168 hours. Cardiac Enzymes: No results for input(s): CKTOTAL, CKMB, CKMBINDEX, TROPONINI in the last 168 hours. BNP (last 3 results) No results for input(s): PROBNP in the last 8760 hours. HbA1C: No results for input(s): HGBA1C in the last 72 hours. CBG: Recent Labs  Lab 03/21/18 1744 03/21/18 2207 03/22/18 0542 03/22/18 0742 03/22/18 1154  GLUCAP 102* 136* 74 184* 88   Lipid Profile: No results for input(s): CHOL, HDL, LDLCALC, TRIG, CHOLHDL, LDLDIRECT in the last 72 hours. Thyroid Function Tests: No results for input(s): TSH, T4TOTAL, FREET4, T3FREE, THYROIDAB in the last 72 hours. Anemia  Panel: No results for input(s): VITAMINB12, FOLATE, FERRITIN, TIBC, IRON, RETICCTPCT in the last 72 hours. Sepsis Labs: Recent Labs  Lab 03/21/18 1239 03/21/18 1529  LATICACIDVEN 1.78 0.89    Recent Results (from the past 240 hour(s))  Blood Culture (routine x 2)     Status: None (Preliminary result)   Collection Time: 03/21/18 12:34 PM  Result Value Ref Range Status   Specimen Description   Final    BLOOD LEFT ANTECUBITAL Performed at [facility], [address] [place], [place] [address]    Special Requests   Final    BOTTLES DRAWN AEROBIC AND ANAEROBIC Blood Culture adequate volume Performed at [facility], [address] [place], [place] [address]    Culture   Final    NO GROWTH < 24 HOURS Performed at [facility], [address], [place], [place]  [id number]    Report Status PENDING  Incomplete  Blood Culture (routine x 2)     Status: None (Preliminary result)   Collection Time: 03/21/18 12:34 PM  Result Value Ref Range Status   Specimen Description   Final    BLOOD RIGHT FOREARM Performed at [facility], [address] [place], [place] [address]    Special Requests   Final    BOTTLES DRAWN AEROBIC AND ANAEROBIC Blood Culture adequate volume Performed at [facility], [address] [place], [place] [address]    Culture   Final    NO GROWTH < 24 HOURS Performed at [facility], [address] [place], [place] [address]    Report Status PENDING  Incomplete  Urine culture     Status: Abnormal   Collection Time: 03/21/18 12:34 PM  Result Value Ref Range Status   Specimen Description   Final    URINE, RANDOM Performed at [facility], [address] [place], [place] [address]    Special Requests   Final    NONE Performed at [facility], [address] [place], [place] [address]    Culture (A)  Final    <10,000 COLONIES/mL  INSIGNIFICANT GROWTH Performed at [facility], [address] [place], [place] [address]    Report Status 03/22/2018 FINAL  Final         Radiology Studies: Dg Chest Portable 1 View  Result Date: 03/21/2018 CLINICAL DATA:  Fever and vomiting EXAM: PORTABLE CHEST 1 VIEW COMPARISON:  May 01, 2015 FINDINGS: No edema or consolidation. Heart is upper normal in size with pulmonary vascularity normal. No adenopathy. There is aortic atherosclerosis. No evident bone lesions. IMPRESSION: Aortic atherosclerosis. No edema or consolidation. Heart upper normal in size. Aortic Atherosclerosis (ICD10-I70.0). Electronically Signed   By: Bretta BangWilliam  Woodruff III M.D.   On: 03/21/2018 12:50   Ct Renal Stone Study  Result Date: 03/21/2018 CLINICAL DATA:  Bilateral flank pain and abnormal urinalysis. EXAM: CT ABDOMEN AND PELVIS WITHOUT CONTRAST TECHNIQUE: Multidetector CT imaging of the abdomen and pelvis was performed following the standard protocol without IV contrast. COMPARISON:  02/11/2010 FINDINGS: Lower chest: Top-normal size heart without pericardial effusion or thickening. Minimal bibasilar atelectasis. No effusion or pneumothorax. Hepatobiliary: Cholecystectomy.  Unenhanced liver is unremarkable. Pancreas: Atrophic without ductal dilatation or space-occupying mass given limitations of a noncontrast study. Spleen:  Normal size spleen. Adrenals/Urinary Tract: The adrenal glands are normal in size and appearance. Mild nonspecific perinephric fat stranding is identified about both kidneys, left greater than right with mild bilateral hydroureteronephrosis. No obstructing calculus is identified however. Suggestion of mild posterior bladder wall mucosal thickening is noted, more focally thickened near the left UVJ measuring 12 x 7 mm. Although findings may be post inflammatory or infectious in etiology, a mucosal lesion or potentially a layering debris or clot are not entirely excluded. Clinical correlation  and if necessary direct visual correlation may prove useful. Stomach/Bowel: Small hiatal hernia. Decompressed stomach. Normal duodenal sweep and ligament of Treitz position. No bowel obstruction or inflammation. Moderate amount of fecal retention is noted within the colon with scattered colonic diverticulosis but without acute diverticulitis. Vascular/Lymphatic: Mild-to-moderate aortoiliac atherosclerosis. No aneurysm. Reproductive: The uterus and adnexa are unremarkable. Other: No free air nor free fluid. Musculoskeletal: Moderate degenerative disc flattening L2-3 and moderate at L3-4. No acute nor aggressive osseous lesions. IMPRESSION: 1. Nonspecific perinephric fat stranding and mild bilateral hydroureteronephrosis without nephrolithiasis nor  ureteral calculi. 2. Slight posterior mucosal thickening suggested of the bladder wall more focally eccentrically thickened adjacent to the left UVJ measuring 12 x 7 mm. A mucosal lesion, clot or focal inflammatory thickening might account for this appearance. Clinical correlation and if necessary, direct visual correlation is recommended. 3. Colonic diverticulosis without acute diverticulitis. 4. Small hiatal hernia. 5. 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 Electronically Signed   By: Tollie Eth M.D.   On: 03/21/2018 15:22        Scheduled Meds: . amLODipine  5 mg Oral Daily  . aspirin EC  81 mg Oral Daily  . enoxaparin (LOVENOX) injection  40 mg Subcutaneous Q24H  . hyoscyamine  0.125 mg Sublingual BID  . insulin aspart  0-9 Units Subcutaneous TID WC  . insulin glargine  6 Units Subcutaneous QHS  . levothyroxine  25 mcg Oral QAC breakfast  . losartan  100 mg Oral Daily  .  morphine injection  4 mg Intravenous Once  . nebivolol  10 mg Oral QHS  . ondansetron (ZOFRAN) IV  4 mg Intravenous Once  . simvastatin  20 mg Oral Daily  . sodium chloride flush  3 mL Intravenous Q12H   Continuous Infusions: . sodium chloride    . cefTRIAXone (ROCEPHIN)  IV  1 g (03/21/18 2236)     LOS: 1 day    Time spent: 35 minutes    Ramiro Harvest, MD [facility] Pager [phone]  If 7PM-7AM, please contact night-coverage [email or web address] Password [facility] 03/22/2018, 4:12 PM

## 2018-03-23 LAB — CBC
HCT: 33.8 % — ABNORMAL LOW (ref 36.0–46.0)
Hemoglobin: 11.6 g/dL — ABNORMAL LOW (ref 12.0–15.0)
MCH: 33 pg (ref 26.0–34.0)
MCHC: 34.3 g/dL (ref 30.0–36.0)
MCV: 96 fL (ref 78.0–100.0)
PLATELETS: 189 10*3/uL (ref 150–400)
RBC: 3.52 MIL/uL — ABNORMAL LOW (ref 3.87–5.11)
RDW: 13 % (ref 11.5–15.5)
WBC: 6.6 10*3/uL (ref 4.0–10.5)

## 2018-03-23 LAB — HEMOGLOBIN A1C
HEMOGLOBIN A1C: 5.7 % — AB (ref 4.8–5.6)
Mean Plasma Glucose: 116.89 mg/dL

## 2018-03-23 LAB — GLUCOSE, CAPILLARY
GLUCOSE-CAPILLARY: 197 mg/dL — AB (ref 70–99)
GLUCOSE-CAPILLARY: 170 mg/dL — AB (ref 70–99)
GLUCOSE-CAPILLARY: 214 mg/dL — AB (ref 70–99)
Glucose-Capillary: 110 mg/dL — ABNORMAL HIGH (ref 70–99)

## 2018-03-23 LAB — BASIC METABOLIC PANEL
Anion gap: 8 (ref 5–15)
BUN: 9 mg/dL (ref 8–23)
CALCIUM: 9.3 mg/dL (ref 8.9–10.3)
CO2: 26 mmol/L (ref 22–32)
CREATININE: 0.73 mg/dL (ref 0.44–1.00)
Chloride: 106 mmol/L (ref 98–111)
GFR calc Af Amer: >60 mL/min (ref >60)
Glucose, Bld: 113 mg/dL — ABNORMAL HIGH (ref 70–99)
Potassium: 4.4 mmol/L (ref 3.5–5.1)
Sodium: 140 mmol/L (ref 135–145)

## 2018-03-23 LAB — MAGNESIUM: Magnesium: 2.3 mg/dL (ref 1.7–2.4)

## 2018-03-23 NOTE — Progress Notes (Signed)
PROGRESS NOTE    Kristina [name]  ZOX:[id number] DOB: 12/01/1945 DOA: 03/21/2018 PCP: Shaune PollackGates, Donna, MD    Brief Narrative:  Kristina [name] is a 72 y.o. female with medical history of HTN, IBS, DM, HLD, who noticed her urine was cloudy and went to the Urgent care today. While she was there she began to have chills and shakes. She subsequently was told to come to the ED where she was noted to have a fever of 104.4. She has no dysuria or suprapubic pain. No flank pain either. No h/o frequent UTIs.   ED Course: fever 104.4, pulse > 107- given Vanc, Cefepime and Flagyl   Assessment & Plan:   Principal Problem:   Acute pyelonephritis Active Problems:   Essential hypertension   Diabetes mellitus type 2, controlled (HCC)   UTI (urinary tract infection)   IBS (irritable bowel syndrome)   Pyelonephritis  #1 acute pyelonephritis Urinalysis which was done on admission with large leukocytes.  CT renal stone protocol suggestive of pyelonephritis but also mild bilateral hydronephrosis and bladder wall prominence.  Patient has been pancultured results pending.  Fever curve is trending down.  Clinical improvement.  Continue empiric IV Rocephin.  Discontinued IV Flagyl.  Continue IV fluids.  Patient has been seen in consultation by urology, Dr. Sherron MondayMacDiarmid who recommended continuation of IV antibiotics and outpatient follow-up for cystoscopy.  Also recommending awaiting culture results prior to transitioning to oral antibiotics.  Continue IV Rocephin.  Supportive care.  Follow.  2.  Well-controlled diabetes mellitus type 2 Patient states last hemoglobin A1c was 6.1 in May 2019.  Patient noted to have a CBG of 74 and was symptomatic on 03/22/2018.  CBG improved.  Patient noted to be on metformin and Lantus prior to admission.  Patient was placed on the reduced dose of her home Lantus.  Discontinued Lantus.  CBG was 110 this morning.  Hemoglobin A1c is 5.7.  Continue to hold oral hypoglycemic agent.   Sliding scale insulin.  On discharge we will likely discontinue patient's insulin versus Lantus 5 units daily.  Outpatient follow-up with PCP  3.  Hypertension Currently controlled on home regimen of Norvasc, Cozaar, Bystolic.   4.  IBS Stable.  Continue Levsin.  5.  Hypothyroidism Continue home regimen Synthroid.  Outpatient follow-up with PCP.  6.  Diverticulosis Seen on CT.  Outpatient follow-up.  7.  Hypokalemia/hypomagnesemia Magnesium level checked was 1.6.  Patient given IV magnesium 4 g x 1.  Repeat magnesium level at 2.3.  Potassium at 4.4.  Follow.     DVT prophylaxis: Lovenox Code Status: Full Family Communication: Updated patient.  No family at bedside. Disposition Plan: Home when clinically improved and medically stable.   Consultants:   Urology: Dr. Sherron MondayMacDiarmid 03/21/2018  Procedures:   CT renal stone study 03/21/2018  Chest x-ray 03/21/2018  Antimicrobials:   IV Rocephin 03/21/2018  IV Flagyl 03/21/2018>>>>> 03/22/2018   Subjective: Patient laying in bed.  Patient states ambulated with nursing in the hallway.  Denies any chest pain or shortness of breath.  No chills noted.  Denies any abdominal pain.    Objective: Vitals:   03/22/18 0529 03/22/18 1420 03/22/18 2002 03/23/18 0448  BP: 129/63 (!) 143/64 139/63 (!) 154/69  Pulse: 71 74 77 (!) 58  Resp: 19 19 20 18   Temp: 99 F (37.2 C) 99.4 F (37.4 C) (!) 100.9 F (38.3 C) 99 F (37.2 C)  TempSrc: Oral  Oral Oral  SpO2: 94% 96% 94% 96%  Weight:  Height:        Intake/Output Summary (Last 24 hours) at 03/23/2018 1253 Last data filed at 03/22/2018 1546 Gross per 24 hour  Intake 50 ml  Output -  Net 50 ml   Filed Weights   03/21/18 1220  Weight: 79.4 kg    Examination:  General exam: NAD Respiratory system: CTAB.  No wheezes, no crackles, no rhonchi.   Cardiovascular system: Regular rate rhythm no murmurs rubs or gallops.  No JVD.  No lower extremity edema.  Gastrointestinal system:  Abdomen is soft, nontender, nondistended, positive bowel sounds.  No rebound.  No guarding.  Central nervous system: Alert and oriented. No focal neurological deficits. Extremities: Symmetric 5 x 5 power. Skin: No rashes, lesions or ulcers Psychiatry: Judgement and insight appear normal. Mood & affect appropriate.     Data Reviewed: I have personally reviewed following labs and imaging studies  CBC: Recent Labs  Lab 03/21/18 1228 03/22/18 0548 03/23/18 0518  WBC 8.9 8.0 6.6  NEUTROABS 8.3*  --   --   HGB 12.5 10.9* 11.6*  HCT 36.5 31.6* 33.8*  MCV 95.3 96.3 96.0  PLT 191 164 189   Basic Metabolic Panel: Recent Labs  Lab 03/21/18 1228 03/22/18 0548 03/23/18 0518  NA 138 141 140  K 4.1 3.4* 4.4  CL 103 109 106  CO2 27 26 26   GLUCOSE 137* 84 113*  BUN 14 8 9   CREATININE 0.75 0.74 0.73  CALCIUM 9.5 8.7* 9.3  MG  --  1.6* 2.3   GFR: Estimated Creatinine Clearance: 63.9 mL/min (by C-G formula based on SCr of 0.73 mg/dL). Liver Function Tests: Recent Labs  Lab 03/21/18 1228  AST 21  ALT 14  ALKPHOS 53  BILITOT 1.0  PROT 7.2  ALBUMIN 4.0   Recent Labs  Lab 03/21/18 1228  LIPASE 19   No results for input(s): AMMONIA in the last 168 hours. Coagulation Profile: No results for input(s): INR, PROTIME in the last 168 hours. Cardiac Enzymes: No results for input(s): CKTOTAL, CKMB, CKMBINDEX, TROPONINI in the last 168 hours. BNP (last 3 results) No results for input(s): PROBNP in the last 8760 hours. HbA1C: Recent Labs    03/23/18 0518  HGBA1C 5.7*   CBG: Recent Labs  Lab 03/22/18 1634 03/22/18 2005 03/22/18 2207 03/23/18 0713 03/23/18 1145  GLUCAP 149* 185* 170* 110* 197*   Lipid Profile: No results for input(s): CHOL, HDL, LDLCALC, TRIG, CHOLHDL, LDLDIRECT in the last 72 hours. Thyroid Function Tests: No results for input(s): TSH, T4TOTAL, FREET4, T3FREE, THYROIDAB in the last 72 hours. Anemia Panel: No results for input(s): VITAMINB12, FOLATE,  FERRITIN, TIBC, IRON, RETICCTPCT in the last 72 hours. Sepsis Labs: Recent Labs  Lab 03/21/18 1239 03/21/18 1529  LATICACIDVEN 1.78 0.89    Recent Results (from the past 240 hour(s))  Blood Culture (routine x 2)     Status: None (Preliminary result)   Collection Time: 03/21/18 12:34 PM  Result Value Ref Range Status   Specimen Description   Final    BLOOD LEFT ANTECUBITAL Performed at [facility], [address] [place], [place] [address]    Special Requests   Final    BOTTLES DRAWN AEROBIC AND ANAEROBIC Blood Culture adequate volume Performed at [facility], [address] [place], [place] [address]    Culture   Final    NO GROWTH < 24 HOURS Performed at [facility], [address], [place], [place] [address]  Report Status PENDING  Incomplete  Blood Culture (routine x 2)     Status: None (Preliminary result)   Collection Time: 03/21/18 12:34 PM  Result Value Ref Range Status   Specimen Description   Final    BLOOD RIGHT FOREARM Performed at [facility], [address] [place], [place] [address]    Special Requests   Final    BOTTLES DRAWN AEROBIC AND ANAEROBIC Blood Culture adequate volume Performed at [facility], [address] [place], [place] [address]    Culture   Final    NO GROWTH < 24 HOURS Performed at [facility], [address], [place], [place] [address]    Report Status PENDING  Incomplete  Urine culture     Status: Abnormal   Collection Time: 03/21/18 12:34 PM  Result Value Ref Range Status   Specimen Description   Final    URINE, RANDOM Performed at [facility], [address], [place], [place] [address]    Special Requests   Final    NONE Performed at [facility], [address], [place], [place] [address]    Culture (A)  Final    <10,000 COLONIES/mL INSIGNIFICANT GROWTH Performed at [facility], [address] [place], [place] [address]    Report Status 03/22/2018 FINAL  Final         Radiology Studies: Ct Renal Stone Study  Result Date: 03/21/2018 CLINICAL DATA:  Bilateral flank pain and abnormal urinalysis. EXAM: CT ABDOMEN AND PELVIS WITHOUT CONTRAST TECHNIQUE: Multidetector CT imaging of the abdomen and pelvis was performed following the standard protocol without IV contrast. COMPARISON:  02/11/2010 FINDINGS: Lower chest: Top-normal size heart without pericardial effusion or thickening. Minimal bibasilar atelectasis. No effusion or pneumothorax. Hepatobiliary: Cholecystectomy.  Unenhanced liver is unremarkable. Pancreas: Atrophic without ductal dilatation or space-occupying mass given limitations of a noncontrast study. Spleen:  Normal size spleen. Adrenals/Urinary Tract: The adrenal glands are normal in size and appearance. Mild nonspecific perinephric fat stranding is identified about both kidneys, left greater than right with mild bilateral hydroureteronephrosis. No obstructing calculus is identified however. Suggestion of mild posterior bladder wall mucosal thickening is noted, more focally thickened near the left UVJ measuring 12 x 7 mm. Although findings may be post inflammatory or infectious in etiology, a mucosal lesion or potentially a layering debris or clot are not entirely excluded. Clinical correlation and if necessary direct visual correlation may prove useful. Stomach/Bowel: Small hiatal hernia. Decompressed stomach. Normal duodenal sweep and ligament of Treitz position. No bowel obstruction or inflammation. Moderate amount of fecal retention is noted within the colon with scattered colonic diverticulosis but without acute diverticulitis. Vascular/Lymphatic: Mild-to-moderate aortoiliac atherosclerosis. No aneurysm. Reproductive: The uterus and adnexa are unremarkable. Other: No free air nor free fluid. Musculoskeletal: Moderate degenerative disc flattening L2-3 and moderate  at L3-4. No acute nor aggressive osseous lesions. IMPRESSION: 1. Nonspecific perinephric fat stranding and mild bilateral hydroureteronephrosis without nephrolithiasis nor ureteral calculi. 2. Slight posterior mucosal thickening suggested of the bladder wall more focally eccentrically thickened adjacent to the left UVJ measuring 12 x 7 mm. A mucosal lesion, clot or focal inflammatory thickening might account for this appearance. Clinical correlation and if necessary, direct visual correlation is recommended. 3. Colonic diverticulosis without acute diverticulitis. 4. Small hiatal hernia. 5. 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 Electronically Signed   By: Tollie Eth M.D.   On: 03/21/2018 15:22  Scheduled Meds: . amLODipine  5 mg Oral Daily  . aspirin EC  81 mg Oral Daily  . enoxaparin (LOVENOX) injection  40 mg Subcutaneous Q24H  . hyoscyamine  0.125 mg Sublingual BID  . insulin aspart  0-9 Units Subcutaneous TID WC  . levothyroxine  25 mcg Oral QAC breakfast  . losartan  100 mg Oral Daily  .  morphine injection  4 mg Intravenous Once  . nebivolol  10 mg Oral QHS  . ondansetron (ZOFRAN) IV  4 mg Intravenous Once  . simvastatin  20 mg Oral Daily  . sodium chloride flush  3 mL Intravenous Q12H   Continuous Infusions: . sodium chloride    . cefTRIAXone (ROCEPHIN)  IV 1 g (03/22/18 2152)     LOS: 2 days    Time spent: 35 minutes    Ramiro Harvest, MD [facility] Pager [phone]  If 7PM-7AM, please contact night-coverage [email or web address] Password [facility] 03/23/2018, 12:53 PM

## 2018-03-24 DIAGNOSIS — E876 Hypokalemia: Secondary | ICD-10-CM

## 2018-03-24 DIAGNOSIS — E079 Disorder of thyroid, unspecified: Secondary | ICD-10-CM

## 2018-03-24 DIAGNOSIS — E039 Hypothyroidism, unspecified: Secondary | ICD-10-CM

## 2018-03-24 DIAGNOSIS — K589 Irritable bowel syndrome without diarrhea: Secondary | ICD-10-CM

## 2018-03-24 LAB — BASIC METABOLIC PANEL
Anion gap: 9 (ref 5–15)
BUN: 13 mg/dL (ref 8–23)
CALCIUM: 9.4 mg/dL (ref 8.9–10.3)
CO2: 27 mmol/L (ref 22–32)
Chloride: 102 mmol/L (ref 98–111)
Creatinine, Ser: 0.76 mg/dL (ref 0.44–1.00)
GFR calc Af Amer: >60 mL/min (ref >60)
GLUCOSE: 274 mg/dL — AB (ref 70–99)
Potassium: 4.3 mmol/L (ref 3.5–5.1)
Sodium: 138 mmol/L (ref 135–145)

## 2018-03-24 LAB — GLUCOSE, CAPILLARY
GLUCOSE-CAPILLARY: 153 mg/dL — AB (ref 70–99)
Glucose-Capillary: 167 mg/dL — ABNORMAL HIGH (ref 70–99)

## 2018-03-24 MED ORDER — CEFPODOXIME PROXETIL 200 MG PO TABS: 200.0000 mg | ORAL_TABLET | Freq: Two times a day (BID) | ORAL | 0 refills | Status: AC

## 2018-03-24 MED ORDER — SACCHAROMYCES BOULARDII 250 MG PO CAPS: 250.0000 mg | ORAL_CAPSULE | Freq: Two times a day (BID) | ORAL | 0 refills | Status: AC

## 2018-03-24 NOTE — Evaluation (Signed)
Physical Therapy One Time Evaluation Patient Details Name: TEANA LINDAHL MRN: [id number] DOB: 06/10/1946 Today's Date: 03/24/2018   History of Present Illness  72 y.o. female with medical history of HTN, IBS, DM, HLD and admitted for acute pyelonephritis  Clinical Impression  Patient evaluated by Physical Therapy with no further acute PT needs identified. All education has been completed and the patient has no further questions.  Pt mobilizing well and denies any symptoms.  Pt hopeful for d/c home today.  See below for any follow-up Physical Therapy or equipment needs. PT is signing off. Thank you for this referral.     Follow Up Recommendations No PT follow up    Equipment Recommendations  None recommended by PT    Recommendations for Other Services       Precautions / Restrictions Precautions Precautions: None Restrictions Weight Bearing Restrictions: No      Mobility  Bed Mobility Overal bed mobility: Modified Independent                Transfers Overall transfer level: Modified independent                  Ambulation/Gait Ambulation/Gait assistance: Min guard;Supervision Gait Distance (Feet): 400 Feet Assistive device: None Gait Pattern/deviations: Step-through pattern;Decreased stride length     General Gait Details: good pace, steady gait, no LOB observed, pt denies any symptoms.  Stairs            Wheelchair Mobility    Modified Rankin (Stroke Patients Only)       Balance Overall balance assessment: No apparent balance deficits (not formally assessed)                                           Pertinent Vitals/Pain Pain Assessment: No/denies pain    Home Living Family/patient expects to be discharged to:: Private residence Living Arrangements: Alone   Type of Home: House       Home Layout: One level Home Equipment: Cane - single point      Prior Function Level of Independence: Independent                Hand Dominance        Extremity/Trunk Assessment        Lower Extremity Assessment Lower Extremity Assessment: Overall WFL for tasks assessed       Communication   Communication: No difficulties  Cognition Arousal/Alertness: Awake/alert Behavior During Therapy: WFL for tasks assessed/performed Overall Cognitive Status: Within Functional Limits for tasks assessed                                        General Comments      Exercises     Assessment/Plan    PT Assessment Patent does not need any further PT services  PT Problem List         PT Treatment Interventions      PT Goals (Current goals can be found in the Care Plan section)  Acute Rehab PT Goals PT Goal Formulation: All assessment and education complete, DC therapy    Frequency     Barriers to discharge        Co-evaluation               AM-PAC PT "6 Clicks"  Daily Activity  Outcome Measure Difficulty turning over in bed (including adjusting bedclothes, sheets and blankets)?: None Difficulty moving from lying on back to sitting on the side of the bed? : None Difficulty sitting down on and standing up from a chair with arms (e.g., wheelchair, bedside commode, etc,.)?: None Help needed moving to and from a bed to chair (including a wheelchair)?: A Little Help needed walking in hospital room?: A Little Help needed climbing 3-5 steps with a railing? : A Little 6 Click Score: 21    End of Session Equipment Utilized During Treatment: Gait belt Activity Tolerance: Patient tolerated treatment well Patient left: in chair;with call bell/phone within reach Nurse Communication: Mobility status PT Visit Diagnosis: Other abnormalities of gait and mobility (R26.89)    Time: [id number] PT Time Calculation (min) (ACUTE ONLY): 16 min   Charges:   PT Evaluation $PT Eval Low Complexity: 1 Low          Zenovia JarredKati Sadrac Zeoli, PT, DPT 03/24/2018 Pager: [phone]  Maida SaleLEMYRE,KATHrine  E 03/24/2018, 11:59 AM

## 2018-03-24 NOTE — Care Management Important Message (Signed)
Important Message  Patient Details  Name: Kristina [name] Dornfeld MRN: [id number] Date of Birth: 07/18/1946   Medicare Important Message Given:  Yes    Caren MacadamFuller, Xane Amsden 03/24/2018, 12:20 PMImportant Message  Patient Details  Name: Kristina [name] Persing MRN: [id number] Date of Birth: 08/27/1945   Medicare Important Message Given:       Caren MacadamFuller, Bria Portales 03/24/2018, 12:20 PM

## 2018-03-24 NOTE — Discharge Summary (Signed)
Physician Discharge Summary  Kristina [name] GNF:[id number] DOB: July 24, 1946 DOA: 03/21/2018  PCP: Shaune Pollack, MD  Admit date: 03/21/2018 Discharge date: 03/24/2018  Time spent: 60 minutes  Recommendations for Outpatient Follow-up:  1. Follow-up with Shaune Pollack, MD in 2 weeks.  On follow-up patient's diabetes will need to be reassessed as patient's Lantus was discontinued due to low CBGs.  Patient's Hemoglobin A1c noted to be 5.7 during this admission.  Patient will also need a basic metabolic profile done to follow-up on electrolytes and renal function. 2. Follow-up with Dr. Alfredo Martinez, urology for cystoscopy and further evaluation of bladder due to abnormalities noted on CT scan.   Discharge Diagnoses:  Principal Problem:   Acute pyelonephritis Active Problems:   Essential hypertension   Diabetes mellitus type 2, controlled (HCC)   UTI (urinary tract infection)   IBS (irritable bowel syndrome)   Pyelonephritis   Hypokalemia   Hypothyroidism   Discharge Condition: Stable and improved  Diet recommendation: Carb modified diet  Filed Weights   03/21/18 1220  Weight: 79.4 kg    History of present illness:  Per Dr. Carmin Muskrat Kristina [name] is a 72 y.o. female with medical history of HTN, IBS, DM, HLD, who noticed her urine was cloudy and went to the Urgent care today. While she was there she began to have chills and shakes. She subsequently was told to come to the ED where she was noted to have a fever of 104.4. She has no dysuria or suprapubic pain. No flank pain either. No h/o frequent UTIs.   ED Course: fever 104.4, pulse > 107- given Vanc, Cefepime and Flagyl  Hospital Course:  1 acute pyelonephritis Urinalysis which was done on admission with large leukocytes.  CT renal stone protocol suggestive of pyelonephritis but also mild bilateral hydronephrosis and bladder wall prominence.  Patient was pancultured results with no growth to date.  Urine cultures with  insignificant growth.  Patient's fever curve trended down and patient was afebrile 24 hours prior to discharge.  Patient improved clinically on empiric IV Rocephin.  Patient had initially been given a dose of IV Flagyl which was discontinued.  Patient hydrated with IV fluids.  Patient was seen in consultation by urology, Dr. Sherron Monday who recommended continuation of IV antibiotics and outpatient follow-up for cystoscopy.    Urine cultures were insignificant.  As patient improved clinically on IV Rocephin patient will subsequently be discharged on 7 more days of oral Vantin to complete a 10-day course of antibiotic treatment.  Discussed with ID.  Outpatient follow-up with PCP.    2.  Well-controlled diabetes mellitus type 2 Patient stated last hemoglobin A1c was 6.1 in May 2019.  Patient noted to have a CBG of 74 and was symptomatic on 03/22/2018.  CBG improved.  Patient noted to be on metformin and Lantus prior to admission.  Patient was placed on the reduced dose of her home Lantus on admission however due to low CBGs Lantus was discontinued and patient maintained on a sliding scale insulin.  Repeat hemoglobin A1c which was done was 5.7.  Patient's oral hypoglycemic agents were held during the hospitalization and patient maintained on sliding scale insulin.  Patient will be discharged home on only her oral hypoglycemic agents.  Patient's Lantus has been discontinued on discharge.  Outpatient follow-up with PCP.  3.  Hypertension Blood pressure remained controlled on home regimen of Norvasc, Cozaar, Bystolic.   4.  IBS Stable.  Continued on home regimen of Levsin.  5.  Hypothyroidism Patient was maintained on home regimen of Synthroid.  Outpatient follow-up with PCP.   6.  Diverticulosis Seen on CT. No evidence of diverticulitis. Outpatient follow-up.  7.  Hypokalemia/hypomagnesemia Magnesium level checked was 1.6.  Patient magnesium was repleted as well as her potassium.  Outpatient  follow-up with PCP.    Procedures:  CT renal stone study 03/21/2018  Chest x-ray 03/21/2018  Consultations:  Urology: Dr. Sherron Monday 03/21/2018  Discharge Exam: Vitals:   03/24/18 0457 03/24/18 1417  BP: (!) 149/68 (!) 154/72  Pulse: 70 71  Resp: 16 16  Temp: 98.8 F (37.1 C) 98.6 F (37 C)  SpO2: 96% 99%    General: NAD Cardiovascular: RRR Respiratory: CTAB  Discharge Instructions   Discharge Instructions    Diet Carb Modified   Complete by:  As directed    Increase activity slowly   Complete by:  As directed      Allergies as of 03/24/2018      Reactions   Ace Inhibitors Cough   Ciprofloxacin Other (See Comments)   Tendinitis in heel and posterior right LE   Doxycycline Other (See Comments)   Pt does not remember what the reaction was - many years ago   Shrimp [shellfish Allergy] Diarrhea, Nausea And Vomiting, Rash      Medication List    STOP taking these medications   amoxicillin-clavulanate 875-125 MG tablet Commonly known as:  AUGMENTIN   famotidine 20 MG tablet Commonly known as:  PEPCID   insulin glargine 100 unit/mL Sopn Commonly known as:  LANTUS     TAKE these medications   acetaminophen 500 MG tablet Commonly known as:  TYLENOL Take 1,000 mg by mouth 2 (two) times daily as needed (pain).   amLODipine 5 MG tablet Commonly known as:  NORVASC Take 5 mg by mouth daily.   aspirin 81 MG tablet Take 81 mg by mouth daily.   BYSTOLIC 10 MG tablet Generic drug:  nebivolol TAKE 1 TABLET (10 MG TOTAL) BY MOUTH DAILY. What changed:  See the new instructions.   CALCIUM PO Take 500 mg by mouth at bedtime.   cefpodoxime 200 MG tablet Commonly known as:  VANTIN Take 1 tablet (200 mg total) by mouth 2 (two) times daily for 7 days.   hyoscyamine 0.125 MG SL tablet Commonly known as:  LEVSIN SL Place 0.125 mg under the tongue 2 (two) times daily.   levothyroxine 25 MCG tablet Commonly known as:  SYNTHROID, LEVOTHROID Take 25 mcg by mouth  daily before breakfast.   losartan 100 MG tablet Commonly known as:  COZAAR Take 100 mg by mouth daily.   metFORMIN 500 MG 24 hr tablet Commonly known as:  GLUCOPHAGE-XR Take 2,000 mg by mouth daily.   mometasone-formoterol 100-5 MCG/ACT Aero Commonly known as:  DULERA Take 2 puffs first thing in am and then another 2 puffs about 12 hours later.   saccharomyces boulardii 250 MG capsule Commonly known as:  FLORASTOR Take 1 capsule (250 mg total) by mouth 2 (two) times daily for 7 days.   simvastatin 20 MG tablet Commonly known as:  ZOCOR Take 1 tablet by mouth daily.   tiZANidine 4 MG tablet Commonly known as:  ZANAFLEX Take 4 mg by mouth 3 (three) times daily as needed for muscle spasms.      Allergies  Allergen Reactions  . Ace Inhibitors Cough  . Ciprofloxacin Other (See Comments)    Tendinitis in heel and posterior right LE  . Doxycycline Other (See  Comments)    Pt does not remember what the reaction was - many years ago  . Shrimp [Shellfish Allergy] Diarrhea, Nausea And Vomiting and Rash   Follow-up Information    Shaune PollackGates, Donna, MD. Schedule an appointment as soon as possible for a visit in 2 week(s).   Specialty:  Family Medicine Contact information: [address] [place] [address] [phone]        Alfredo MartinezMacDiarmid, Scott, MD. Schedule an appointment as soon as possible for a visit in 2 week(s).   Specialty:  Urology Contact information: [address] [place] [address] [phone]            The results of significant diagnostics from this hospitalization (including imaging, microbiology, ancillary and laboratory) are listed below for reference.    Significant Diagnostic Studies: Dg Chest Portable 1 View  Result Date: 03/21/2018 CLINICAL DATA:  Fever and vomiting EXAM: PORTABLE CHEST 1 VIEW COMPARISON:  May 01, 2015 FINDINGS: No edema or consolidation. Heart is upper normal in size with pulmonary vascularity normal. No  adenopathy. There is aortic atherosclerosis. No evident bone lesions. IMPRESSION: Aortic atherosclerosis. No edema or consolidation. Heart upper normal in size. Aortic Atherosclerosis (ICD10-I70.0). Electronically Signed   By: Bretta BangWilliam  Woodruff III M.D.   On: 03/21/2018 12:50   Ct Renal Stone Study  Result Date: 03/21/2018 CLINICAL DATA:  Bilateral flank pain and abnormal urinalysis. EXAM: CT ABDOMEN AND PELVIS WITHOUT CONTRAST TECHNIQUE: Multidetector CT imaging of the abdomen and pelvis was performed following the standard protocol without IV contrast. COMPARISON:  02/11/2010 FINDINGS: Lower chest: Top-normal size heart without pericardial effusion or thickening. Minimal bibasilar atelectasis. No effusion or pneumothorax. Hepatobiliary: Cholecystectomy.  Unenhanced liver is unremarkable. Pancreas: Atrophic without ductal dilatation or space-occupying mass given limitations of a noncontrast study. Spleen:  Normal size spleen. Adrenals/Urinary Tract: The adrenal glands are normal in size and appearance. Mild nonspecific perinephric fat stranding is identified about both kidneys, left greater than right with mild bilateral hydroureteronephrosis. No obstructing calculus is identified however. Suggestion of mild posterior bladder wall mucosal thickening is noted, more focally thickened near the left UVJ measuring 12 x 7 mm. Although findings may be post inflammatory or infectious in etiology, a mucosal lesion or potentially a layering debris or clot are not entirely excluded. Clinical correlation and if necessary direct visual correlation may prove useful. Stomach/Bowel: Small hiatal hernia. Decompressed stomach. Normal duodenal sweep and ligament of Treitz position. No bowel obstruction or inflammation. Moderate amount of fecal retention is noted within the colon with scattered colonic diverticulosis but without acute diverticulitis. Vascular/Lymphatic: Mild-to-moderate aortoiliac atherosclerosis. No aneurysm.  Reproductive: The uterus and adnexa are unremarkable. Other: No free air nor free fluid. Musculoskeletal: Moderate degenerative disc flattening L2-3 and moderate at L3-4. No acute nor aggressive osseous lesions. IMPRESSION: 1. Nonspecific perinephric fat stranding and mild bilateral hydroureteronephrosis without nephrolithiasis nor ureteral calculi. 2. Slight posterior mucosal thickening suggested of the bladder wall more focally eccentrically thickened adjacent to the left UVJ measuring 12 x 7 mm. A mucosal lesion, clot or focal inflammatory thickening might account for this appearance. Clinical correlation and if necessary, direct visual correlation is recommended. 3. Colonic diverticulosis without acute diverticulitis. 4. Small hiatal hernia. 5. 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 Electronically Signed   By: Tollie Ethavid  Kwon M.D.   On: 03/21/2018 15:22    Microbiology: Recent Results (from the past 240 hour(s))  Blood Culture (routine x 2)     Status: None (Preliminary result)  Collection Time: 03/21/18 12:34 PM  Result Value Ref Range Status   Specimen Description   Final    BLOOD LEFT ANTECUBITAL Performed at [facility], [address], [place], [place] [address]    Special Requests   Final    BOTTLES DRAWN AEROBIC AND ANAEROBIC Blood Culture adequate volume Performed at [facility], [address], [place], [place] [address]    Culture   Final    NO GROWTH 3 DAYS Performed at [facility], [address], [place], [place] [address]    Report Status PENDING  Incomplete  Blood Culture (routine x 2)     Status: None (Preliminary result)   Collection Time: 03/21/18 12:34 PM  Result Value Ref Range Status   Specimen Description   Final    BLOOD RIGHT FOREARM Performed at [facility], [address], [place], [place] [address]    Special Requests   Final    BOTTLES DRAWN AEROBIC AND ANAEROBIC Blood Culture adequate  volume Performed at [facility], [address], [place], [place] [address]    Culture   Final    NO GROWTH 3 DAYS Performed at [facility], [address] [place], [place] [address]    Report Status PENDING  Incomplete  Urine culture     Status: Abnormal   Collection Time: 03/21/18 12:34 PM  Result Value Ref Range Status   Specimen Description   Final    URINE, RANDOM Performed at [facility], [address], [place], [place] [address]    Special Requests   Final    NONE Performed at [facility], [address], [place], [place] [address]    Culture (A)  Final    <10,000 COLONIES/mL INSIGNIFICANT GROWTH Performed at [facility], [address], [place], [place] [address]    Report Status 03/22/2018 FINAL  Final     Labs: Basic Metabolic Panel: Recent Labs  Lab 03/21/18 1228 03/22/18 0548 03/23/18 0518 03/24/18 0905  NA 138 141 140 138  K 4.1 3.4* 4.4 4.3  CL 103 109 106 102  CO2 27 26 26 27   GLUCOSE 137* 84 113* 274*  BUN 14 8 9 13   CREATININE 0.75 0.74 0.73 0.76  CALCIUM 9.5 8.7* 9.3 9.4  MG  --  1.6* 2.3  --    Liver Function Tests: Recent Labs  Lab 03/21/18 1228  AST 21  ALT 14  ALKPHOS 53  BILITOT 1.0  PROT 7.2  ALBUMIN 4.0   Recent Labs  Lab 03/21/18 1228  LIPASE 19   No results for input(s): AMMONIA in the last 168 hours. CBC: Recent Labs  Lab 03/21/18 1228 03/22/18 0548 03/23/18 0518  WBC 8.9 8.0 6.6  NEUTROABS 8.3*  --   --   HGB 12.5 10.9* 11.6*  HCT 36.5 31.6* 33.8*  MCV 95.3 96.3 96.0  PLT 191 164 189   Cardiac Enzymes: No results for input(s): CKTOTAL, CKMB, CKMBINDEX, TROPONINI in the last 168 hours. BNP: BNP (last 3 results) No results for input(s): BNP in the last 8760 hours.  ProBNP (last 3 results) No results for input(s): PROBNP in the last 8760 hours.  CBG: Recent Labs  Lab 03/23/18 1145 03/23/18 1650 03/23/18 2023 03/24/18 0740  03/24/18 1145  GLUCAP 197* 170* 214* 153* 167*       Signed:  Ramiro Harvest MD.  [facility] 03/24/2018, 5:35 PM

## 2018-03-24 NOTE — Progress Notes (Signed)
OT Cancellation Note  Patient Details Name: Kristina [name] Martha MRN: [id number] DOB: 10/28/1945   Cancelled Treatment:    Reason Eval/Treat Not Completed: OT screened, no needs identified, will sign off; spoke with PT who reports pt is moving well, pt in agreement and reports feeling at her baseline regarding ADL and mobility completion. Acute OT to sign off at this time. Thank you for this referral.  Marcy SirenBreanna Madoc Holquin, OT Pager [phone] 03/24/2018   Orlando PennerBreanna L Rileyann Florance 03/24/2018, 10:55 AM

## 2018-03-26 LAB — CULTURE, BLOOD (ROUTINE X 2)
CULTURE: NO GROWTH
Culture: NO GROWTH
Special Requests: ADEQUATE
Special Requests: ADEQUATE

## 2018-04-03 ENCOUNTER — Emergency Department (HOSPITAL_COMMUNITY): Payer: Medicare Other

## 2018-04-03 ENCOUNTER — Observation Stay (HOSPITAL_COMMUNITY)
Admission: EM | Admit: 2018-04-03 | Discharge: 2018-04-04 | Disposition: A | Discharge status: Home / Self Care | Payer: Medicare Other | Attending: Family Medicine | Admitting: Family Medicine

## 2018-04-03 ENCOUNTER — Encounter (HOSPITAL_COMMUNITY): Payer: Self-pay | Admitting: *Deleted

## 2018-04-03 ENCOUNTER — Other Ambulatory Visit: Payer: Self-pay

## 2018-04-03 DIAGNOSIS — T8089XA Other complications following infusion, transfusion and therapeutic injection, initial encounter: Secondary | ICD-10-CM | POA: Diagnosis not present

## 2018-04-03 DIAGNOSIS — E7800 Pure hypercholesterolemia, unspecified: Secondary | ICD-10-CM | POA: Diagnosis not present

## 2018-04-03 DIAGNOSIS — Z79890 Hormone replacement therapy (postmenopausal): Secondary | ICD-10-CM | POA: Insufficient documentation

## 2018-04-03 DIAGNOSIS — E785 Hyperlipidemia, unspecified: Secondary | ICD-10-CM | POA: Insufficient documentation

## 2018-04-03 DIAGNOSIS — R079 Chest pain, unspecified: Secondary | ICD-10-CM | POA: Diagnosis present

## 2018-04-03 DIAGNOSIS — Z79899 Other long term (current) drug therapy: Secondary | ICD-10-CM | POA: Insufficient documentation

## 2018-04-03 DIAGNOSIS — E039 Hypothyroidism, unspecified: Secondary | ICD-10-CM | POA: Insufficient documentation

## 2018-04-03 DIAGNOSIS — I10 Essential (primary) hypertension: Secondary | ICD-10-CM | POA: Diagnosis not present

## 2018-04-03 DIAGNOSIS — F419 Anxiety disorder, unspecified: Secondary | ICD-10-CM | POA: Diagnosis not present

## 2018-04-03 DIAGNOSIS — E1165 Type 2 diabetes mellitus with hyperglycemia: Secondary | ICD-10-CM | POA: Insufficient documentation

## 2018-04-03 DIAGNOSIS — Z66 Do not resuscitate: Secondary | ICD-10-CM | POA: Diagnosis not present

## 2018-04-03 DIAGNOSIS — I160 Hypertensive urgency: Secondary | ICD-10-CM | POA: Insufficient documentation

## 2018-04-03 DIAGNOSIS — Z7982 Long term (current) use of aspirin: Secondary | ICD-10-CM | POA: Insufficient documentation

## 2018-04-03 DIAGNOSIS — J45909 Unspecified asthma, uncomplicated: Secondary | ICD-10-CM | POA: Diagnosis not present

## 2018-04-03 DIAGNOSIS — R072 Precordial pain: Secondary | ICD-10-CM | POA: Diagnosis not present

## 2018-04-03 DIAGNOSIS — Z794 Long term (current) use of insulin: Secondary | ICD-10-CM

## 2018-04-03 DIAGNOSIS — Y842 Radiological procedure and radiotherapy as the cause of abnormal reaction of the patient, or of later complication, without mention of misadventure at the time of the procedure: Secondary | ICD-10-CM | POA: Insufficient documentation

## 2018-04-03 DIAGNOSIS — R7989 Other specified abnormal findings of blood chemistry: Secondary | ICD-10-CM | POA: Diagnosis not present

## 2018-04-03 DIAGNOSIS — Z7984 Long term (current) use of oral hypoglycemic drugs: Secondary | ICD-10-CM | POA: Diagnosis not present

## 2018-04-03 DIAGNOSIS — R0789 Other chest pain: Principal | ICD-10-CM | POA: Insufficient documentation

## 2018-04-03 DIAGNOSIS — E119 Type 2 diabetes mellitus without complications: Secondary | ICD-10-CM

## 2018-04-03 LAB — TSH: TSH: 4.745 u[IU]/mL — AB (ref 0.350–4.500)

## 2018-04-03 LAB — CBC
HCT: 37.7 % (ref 36.0–46.0)
Hemoglobin: 13.3 g/dL (ref 12.0–15.0)
MCH: 33.2 pg (ref 26.0–34.0)
MCHC: 35.3 g/dL (ref 30.0–36.0)
MCV: 94 fL (ref 78.0–100.0)
PLATELETS: 312 10*3/uL (ref 150–400)
RBC: 4.01 MIL/uL (ref 3.87–5.11)
RDW: 12.7 % (ref 11.5–15.5)
WBC: 5.9 10*3/uL (ref 4.0–10.5)

## 2018-04-03 LAB — BASIC METABOLIC PANEL
Anion gap: 13 (ref 5–15)
BUN: 10 mg/dL (ref 8–23)
CO2: 25 mmol/L (ref 22–32)
Calcium: 10.2 mg/dL (ref 8.9–10.3)
Chloride: 102 mmol/L (ref 98–111)
Creatinine, Ser: 0.82 mg/dL (ref 0.44–1.00)
GFR calc Af Amer: >60 mL/min (ref >60)
GFR calc non Af Amer: >60 mL/min (ref >60)
Glucose, Bld: 209 mg/dL — ABNORMAL HIGH (ref 70–99)
Potassium: 4.3 mmol/L (ref 3.5–5.1)
Sodium: 140 mmol/L (ref 135–145)

## 2018-04-03 LAB — LIPID PANEL
CHOL/HDL RATIO: 3.1 ratio
Cholesterol: 126 mg/dL (ref 0–200)
HDL: 41 mg/dL (ref >40)
LDL CALC: 53 mg/dL (ref 0–99)
Triglycerides: 159 mg/dL — ABNORMAL HIGH (ref <150)
VLDL: 32 mg/dL (ref 0–40)

## 2018-04-03 LAB — GLUCOSE, CAPILLARY
Glucose-Capillary: 119 mg/dL — ABNORMAL HIGH (ref 70–99)
Glucose-Capillary: 244 mg/dL — ABNORMAL HIGH (ref 70–99)

## 2018-04-03 LAB — TROPONIN I
Troponin I: <0.03 ng/mL (ref <0.03)
Troponin I: <0.03 ng/mL (ref <0.03)

## 2018-04-03 LAB — D-DIMER, QUANTITATIVE (NOT AT ARMC): D-Dimer, Quant: 1.35 ug/mL-FEU — ABNORMAL HIGH (ref 0.00–0.50)

## 2018-04-03 LAB — I-STAT TROPONIN, ED: Troponin i, poc: 0.03 ng/mL (ref 0.00–0.08)

## 2018-04-03 MED ORDER — ASPIRIN EC 81 MG PO TBEC
81.0000 mg | DELAYED_RELEASE_TABLET | Freq: Every day | ORAL | Status: DC
  Administered 2018-04-03 – 2018-04-04 (×2): 81 mg via ORAL
  Filled 2018-04-03: qty 1

## 2018-04-03 MED ORDER — IOPAMIDOL (ISOVUE-370) INJECTION 76%
100.0000 mL | Freq: Once | INTRAVENOUS | Status: AC | PRN
  Administered 2018-04-03: 100 mL via INTRAVENOUS

## 2018-04-03 MED ORDER — LEVOTHYROXINE SODIUM 25 MCG PO TABS
25.0000 ug | ORAL_TABLET | Freq: Every day | ORAL | Status: DC
  Administered 2018-04-04: 25 ug via ORAL
  Filled 2018-04-03: qty 1

## 2018-04-03 MED ORDER — AMLODIPINE BESYLATE 10 MG PO TABS
10.0000 mg | ORAL_TABLET | Freq: Every day | ORAL | Status: DC
  Administered 2018-04-03 – 2018-04-04 (×2): 10 mg via ORAL
  Filled 2018-04-03 (×2): qty 1

## 2018-04-03 MED ORDER — HYDRALAZINE HCL 20 MG/ML IJ SOLN: 10.0000 mg | Freq: Four times a day (QID) | INTRAMUSCULAR | Status: DC | PRN

## 2018-04-03 MED ORDER — NEBIVOLOL HCL 10 MG PO TABS
10.0000 mg | ORAL_TABLET | Freq: Every day | ORAL | Status: DC
  Administered 2018-04-03 – 2018-04-04 (×2): 10 mg via ORAL
  Filled 2018-04-03 (×3): qty 1

## 2018-04-03 MED ORDER — AMLODIPINE BESYLATE 5 MG PO TABS: 5.0000 mg | ORAL_TABLET | Freq: Every day | ORAL | Status: DC

## 2018-04-03 MED ORDER — SIMVASTATIN 20 MG PO TABS
20.0000 mg | ORAL_TABLET | Freq: Every day | ORAL | Status: DC
  Administered 2018-04-03 – 2018-04-04 (×2): 20 mg via ORAL
  Filled 2018-04-03 (×2): qty 1

## 2018-04-03 MED ORDER — IOPAMIDOL (ISOVUE-370) INJECTION 76%
INTRAVENOUS | Status: AC
  Filled 2018-04-03: qty 100

## 2018-04-03 MED ORDER — CALCIUM CARBONATE 1250 (500 CA) MG PO TABS
1250.0000 mg | ORAL_TABLET | Freq: Every day | ORAL | Status: DC
  Administered 2018-04-03: 1250 mg via ORAL
  Filled 2018-04-03: qty 1

## 2018-04-03 MED ORDER — INSULIN ASPART 100 UNIT/ML ~~LOC~~ SOLN
0.0000 [IU] | SUBCUTANEOUS | Status: DC
  Administered 2018-04-03: 3 [IU] via SUBCUTANEOUS
  Administered 2018-04-04: 1 [IU] via SUBCUTANEOUS
  Administered 2018-04-04: 2 [IU] via SUBCUTANEOUS
  Administered 2018-04-04: 0 [IU] via SUBCUTANEOUS

## 2018-04-03 NOTE — ED Notes (Signed)
ED TO INPATIENT HANDOFF REPORT  Name/Age/Gender Kristina [name] 72 y.o. female  Code Status Code Status History    Date Active Date Inactive Code Status Order ID Comments User Context   03/21/2018 1700 03/24/2018 1850 Full Code [id number]  Debbe Odea, MD Inpatient   05/01/2015 1703 05/02/2015 2150 Full Code [id number]  Reyne Dumas, MD ED      Home/SNF/Other Home  Chief Complaint right side chest pain / SOB  Level of Care/Admitting Diagnosis ED Disposition    ED Disposition Condition [facility] Area: [facility] [[id number]]  Level of Care: Telemetry [5]  Admit to tele based on following criteria: Monitor for Ischemic changes  Diagnosis: Chest pain [888916]  Admitting Physician: Kayleen Memos [[id number]]  Attending Physician: Kayleen Memos [[id number]]  PT Class (Do Not Modify): Observation [104]  PT Acc Code (Do Not Modify): Observation [10022]       Medical History Past Medical History:  Diagnosis Date  . Anxiety   . Asthma   . Diabetes ([place])   . Diabetes mellitus without complication ([place])   . Diverticulitis   . GERD (gastroesophageal reflux disease)   . HTN (hypertension)   . Hypercholesteremia   . Hyperlipidemia   . Hypertension   . Obesity   . Rosacea   . Thyroid disease     Allergies Allergies  Allergen Reactions  . Ace Inhibitors Cough  . Ciprofloxacin Other (See Comments)    Tendinitis in heel and posterior right LE  . Doxycycline Other (See Comments)    Pt does not remember what the reaction was - many years ago  . Shrimp [Shellfish Allergy] Diarrhea, Nausea And Vomiting and Rash    IV Location/Drains/Wounds Patient Lines/Drains/Airways Status   Active Line/Drains/Airways    None          Labs/Imaging Results for orders placed or performed during the hospital encounter of 04/03/18 (from the past 48 hour(s))  Basic metabolic panel     Status: Abnormal   Collection Time: 04/03/18 10:30 AM  Result Value Ref  Range   Sodium 140 135 - 145 mmol/L   Potassium 4.3 3.5 - 5.1 mmol/L   Chloride 102 98 - 111 mmol/L   CO2 25 22 - 32 mmol/L   Glucose, Bld 209 (H) 70 - 99 mg/dL   BUN 10 8 - 23 mg/dL   Creatinine, Ser 0.82 0.44 - 1.00 mg/dL   Calcium 10.2 8.9 - 10.3 mg/dL   GFR calc non Af Amer >60 >60 mL/min   GFR calc Af Amer >60 >60 mL/min    Comment: (NOTE) The eGFR has been calculated using the CKD EPI equation. This calculation has not been validated in all clinical situations. eGFR's persistently <60 mL/min signify possible Chronic Kidney Disease.    Anion gap 13 5 - 15    Comment: Performed at [facility], [place] [address] [place], [place] [address]  I-stat troponin, ED     Status: None   Collection Time: 04/03/18 11:00 AM  Result Value Ref Range   Troponin i, poc 0.03 0.00 - 0.08 ng/mL   Comment 3            Comment: Due to the release kinetics of cTnI, a negative result within the first hours of the onset of symptoms does not rule out myocardial infarction with certainty. If myocardial infarction is still suspected, repeat the test at appropriate intervals.   D-dimer, quantitative (not at [facility])  Status: Abnormal   Collection Time: 04/03/18 11:26 AM  Result Value Ref Range   D-Dimer, Quant 1.35 (H) 0.00 - 0.50 ug/mL-FEU    Comment: (NOTE) At the manufacturer cut-off of 0.50 ug/mL FEU, this assay has been documented to exclude PE with a sensitivity and negative predictive value of 97 to 99%.  At this time, this assay has not been approved by the FDA to exclude DVT/VTE. Results should be correlated with clinical presentation. Performed at [facility], [place] [address], [place], [place] [address]   CBC     Status: None   Collection Time: 04/03/18 12:00 PM  Result Value Ref Range   WBC 5.9 4.0 - 10.5 K/uL   RBC 4.01 3.87 - 5.11 MIL/uL   Hemoglobin 13.3 12.0 - 15.0 g/dL   HCT 37.7 36.0 - 46.0 %   MCV 94.0 78.0 - 100.0 fL   MCH 33.2 26.0 -  34.0 pg   MCHC 35.3 30.0 - 36.0 g/dL   RDW 12.7 11.5 - 15.5 %   Platelets 312 150 - 400 K/uL    Comment: Performed at [facility], [place] [address], [place], [place] [address]   Dg Chest 2 View  Result Date: 04/03/2018 CLINICAL DATA:  Chest pain and shortness of breath today, shortness of breath is worse with exertion EXAM: CHEST - 2 VIEW COMPARISON:  Portable chest x-ray of 03/21/2018 and chest x-ray of 05/01/2015 FINDINGS: No active infiltrate or effusion is seen. Mediastinal and hilar contours are unremarkable. The heart is within upper limits of normal. No bony abnormality is seen. IMPRESSION: No active cardiopulmonary disease. Electronically Signed   By: Ivar Drape M.D.   On: 04/03/2018 11:43   Ct Angio Chest Pe W And/or Wo Contrast  Result Date: 04/03/2018 CLINICAL DATA:  PE suspected. Positive D-dimer. Intermediate probability. EXAM: CT ANGIOGRAPHY CHEST WITH CONTRAST TECHNIQUE: Multidetector CT imaging of the chest was performed using the standard protocol during bolus administration of intravenous contrast. Multiplanar CT image reconstructions and MIPs were obtained to evaluate the vascular anatomy. CONTRAST:  165m ISOVUE-370 IOPAMIDOL (ISOVUE-370) INJECTION 76% NOTE: Infiltration of the IV contrast material occurred within the left antecubital fossa. According to the technologist approximately 20 cc of contrast infiltrated into the surrounding soft tissues. I was personally contacted at the time of the infiltration and advised of this incident. The technologist was advised to follow usual protocol according to amount infiltrated. Per technologist comments the patient's provider, Dr. KWilson Singer was also notified of the infiltration and he inspected the infiltration site. COMPARISON:  None. FINDINGS: Cardiovascular: Mildly diminished exam detail due to diminished IV contrast material within the pulmonary arteries. Mild respiratory motion artifact also diminishes exam detail within  the lower lobe segmental pulmonary arteries.The main pulmonary artery however appears patent. No saddle embolus. The lobar and segmental pulmonary arteries also appear patent without evidence for acute pulmonary embolus. Mild aortic atherosclerosis. Calcification in the LAD coronary artery identified. Mediastinum/Nodes: Normal appearance of the thyroid gland. The trachea appears patent and is midline. Small hiatal hernia. No mediastinal, hilar, axillary or supraclavicular adenopathy. Lungs/Pleura: No pleural effusion. Small subpleural nodule identified within the left upper lobe measuring 4 mm. Upper Abdomen: No acute abnormality. Musculoskeletal: No chest wall abnormality. No acute or significant osseous findings. Review of the MIP images confirms the above findings. IMPRESSION: 1. No evidence for acute pulmonary embolus. 2.  Aortic Atherosclerosis (ICD10-I70.0). 3. Lad coronary artery atherosclerotic calcifications. 4. Left upper lobe pulmonary nodule measures 4 mm. No  follow-up needed if patient is low-risk. Non-contrast chest CT can be considered in 12 months if patient is high-risk. This recommendation follows the consensus statement: Guidelines for Management of Incidental Pulmonary Nodules Detected on CT Images: From the Fleischner Society 2017; Radiology 2017; 284:228-243. Electronically Signed   By: Kerby Moors M.D.   On: 04/03/2018 16:08    Pending Labs Unresulted Labs (From admission, onward)    Start     Ordered   04/04/18 0500  CBC  Tomorrow morning,   R     04/03/18 1655   04/04/18 0500  Comprehensive metabolic panel  Tomorrow morning,   R     04/03/18 1655   04/03/18 1655  Lipid panel  Add-on,   R     04/03/18 1655   04/03/18 1654  Troponin I (q 6hr x 3)  Now then every 6 hours,   R     04/03/18 1655   04/03/18 1654  TSH  Add-on,   R     04/03/18 1655          Vitals/Pain Today's Vitals   04/03/18 1046 04/03/18 1207 04/03/18 1230 04/03/18 1449  BP: (!) 166/82 (!) 161/75  134/65 (!) 170/74  Pulse:  67 64 65  Resp: _0 Temp:      TempSrc:      SpO2: 95% 98% 99% 99%  PainSc:        Isolation Precautions No active isolations  Medications Medications  iopamidol (ISOVUE-370) 76 % injection (has no administration in time range)  insulin aspart (novoLOG) injection 0-9 Units (has no administration in time range)  amLODipine (NORVASC) tablet 5 mg (has no administration in time range)  aspirin tablet 81 mg (has no administration in time range)  nebivolol (BYSTOLIC) tablet 10 mg (has no administration in time range)  Calcium 500 mg (has no administration in time range)  levothyroxine (SYNTHROID, LEVOTHROID) tablet 25 mcg (has no administration in time range)  simvastatin (ZOCOR) tablet 20 mg (has no administration in time range)  iopamidol (ISOVUE-370) 76 % injection 100 mL (100 mLs Intravenous Contrast Given 04/03/18 1511)    Mobility walks

## 2018-04-03 NOTE — ED Notes (Signed)
Patient reports feeling like she needs to "take an extra breath" in the past 15 minutes. States she can feel discomfort over left side of chest. MD notified.

## 2018-04-03 NOTE — Progress Notes (Signed)
While pt was in CT for CTA chest PE, her IV infiltrated.    I elevated pt's arm and placed an ice pack on it.   Redness and swelling noticed.  Dr. Juleen China aware and looked at pt's arm.      Infiltration was at the left [facility] site.

## 2018-04-03 NOTE — Progress Notes (Signed)
Patient had an IV extravasation today during CTA chest. Technologist reports approximately 20 mL contrast and 25 mL saline (45 mL total) infiltrated into her left AC fossa.  Patient awake and alert laying in bed with no complaints at this time. Ice bag is applied to left AC fossa. Left AC fossa with approximately 3 cm fullness/hardness with surrounding erythema. Denies pain to area and no evidence of skin breakdown noted. Radial pulses 2+ bilaterally.  Advised patient to elevate arm and continue to apply ice. Instructed patient to look out for signs of skin breakdown. IR to follow.  Waylan Boga Louk, PA-C 04/03/2018, 4:44 PM

## 2018-04-03 NOTE — H&P (Signed)
History and Physical  Kristina [name] ZOX:[id number] DOB: August 21, 1945 DOA: 04/03/2018  Referring physician: Dr Juleen China PCP: Shaune Pollack, MD  Outpatient Specialists: None Patient coming from: Home Chief Complaint: Chest pain  HPI: Kristina [name] is a 72 y.o. female with medical history significant for hypertension, type 2 diabetes, hypothyroidism, hyperlipidemia who presented to ED [facility] from home with complaints of chest pain that started earlier this afternoon while she was shopping at the grocery store.  She describes the pain as dull, substernal, nonradiating associated with dyspnea and improved at rest.  Patient was recently discharged from the hospital less than 2 weeks ago after being admitted for acute pyelonephritis.  Reports family history of non premature heart disease in her mother.  She denies any fever, chills, abdominal pain, or lower extremity edema.  No recent lengthy trips.  No cough.  ED Course: Upon presentation to the ED, twelve-lead EKG unremarkable for any acute or specific ST-T changes, first set troponin negative, chest x-ray unremarkable.  At the time of this evaluation the patient was chest pain-free.  Troponins will be cycled, 2 sets of troponins will be obtained to rule out ACS. Will continue to be monitored on telemetry unit as observation status.  Review of Systems: Review of systems as noted in the HPI. All other systems reviewed and are negative.   Past Medical History:  Diagnosis Date  . Anxiety   . Asthma   . Diabetes ([facility])   . Diabetes mellitus without complication ([facility])   . Diverticulitis   . GERD (gastroesophageal reflux disease)   . HTN (hypertension)   . Hypercholesteremia   . Hyperlipidemia   . Hypertension   . Obesity   . Rosacea   . Thyroid disease    Past Surgical History:  Procedure Laterality Date  . ACHILLES TENDON REPAIR    . APPENDECTOMY    . CESAREAN SECTION    . CHOLECYSTECTOMY    . hammertoe      Social History:  reports that  she has never smoked. She has never used smokeless tobacco. She reports that she does not drink alcohol or use drugs.   Allergies  Allergen Reactions  . Ace Inhibitors Cough  . Ciprofloxacin Other (See Comments)    Tendinitis in heel and posterior right LE  . Doxycycline Other (See Comments)    Pt does not remember what the reaction was - many years ago  . Shrimp [Shellfish Allergy] Diarrhea, Nausea And Vomiting and Rash    Family History  Problem Relation Age of Onset  . Hypertension Mother   . Heart failure Mother   . Hypotension Father   . Dementia Father       Prior to Admission medications   Medication Sig Start Date End Date Taking? Authorizing Provider  acetaminophen (TYLENOL) 500 MG tablet Take 1,000 mg by mouth 2 (two) times daily as needed (pain).   Yes [provider]  amLODipine (NORVASC) 5 MG tablet Take 5 mg by mouth daily. 01/01/18  Yes [provider]  aspirin 81 MG tablet Take 81 mg by mouth daily.   Yes [provider]  BYSTOLIC 10 MG tablet TAKE 1 TABLET (10 MG TOTAL) BY MOUTH DAILY. Patient taking differently: Take 10 mg by mouth daily.  07/15/15  Yes Nyoka Cowden, MD  CALCIUM PO Take 500 mg by mouth at bedtime.   Yes [provider]  hyoscyamine (LEVSIN SL) 0.125 MG SL tablet Place 0.125 mg under the tongue 2 (two) times  daily.    Yes [provider]  levothyroxine (SYNTHROID, LEVOTHROID) 25 MCG tablet Take 25 mcg by mouth daily before breakfast.   Yes [provider]  losartan (COZAAR) 100 MG tablet Take 100 mg by mouth daily.   Yes [provider]  metFORMIN (GLUCOPHAGE-XR) 500 MG 24 hr tablet Take 1,500 mg by mouth daily.  12/24/17  Yes [provider]  simvastatin (ZOCOR) 20 MG tablet Take 1 tablet by mouth daily. 08/05/13  Yes [provider]  tiZANidine (ZANAFLEX) 4 MG tablet Take 4 mg by mouth 3 (three) times daily as needed for muscle spasms.   Yes [provider]    mometasone-formoterol (DULERA) 100-5 MCG/ACT AERO Take 2 puffs first thing in am and then another 2 puffs about 12 hours later. Patient not taking: Reported on 03/21/2018 12/21/15   Nyoka Cowden, MD    Physical Exam: BP (!) 168/68 (BP Location: Right Arm)   Pulse 76   Temp 98.7 F (37.1 C) (Oral)   Resp 16   Ht 5\' 4"  (1.626 m)   Wt 75.7 kg   SpO2 98%   BMI 28.65 kg/m   . General: 72 y.o. year-old female well developed well nourished in no acute distress.  Alert and oriented x3. . Cardiovascular: Regular rate and rhythm with no rubs or gallops.  No thyromegaly or JVD noted.  No lower extremity edema. 2/4 pulses in all 4 extremities. Marland Kitchen Respiratory: Clear to auscultation with no wheezes or rales. Good inspiratory effort. . Abdomen: Soft nontender nondistended with normal bowel sounds x4 quadrants. . Muskuloskeletal: No cyanosis, clubbing or edema noted bilaterally . Neuro: CN II-XII intact, strength, sensation, reflexes . Skin: No ulcerative lesions noted or rashes . Psychiatry: Judgement and insight appear normal. Mood is appropriate for condition and setting          Labs on Admission:  Basic Metabolic Panel: Recent Labs  Lab 04/03/18 1030  NA 140  K 4.3  CL 102  CO2 25  GLUCOSE 209*  BUN 10  CREATININE 0.82  CALCIUM 10.2   Liver Function Tests: No results for input(s): AST, ALT, ALKPHOS, BILITOT, PROT, ALBUMIN in the last 168 hours. No results for input(s): LIPASE, AMYLASE in the last 168 hours. No results for input(s): AMMONIA in the last 168 hours. CBC: Recent Labs  Lab 04/03/18 1200  WBC 5.9  HGB 13.3  HCT 37.7  MCV 94.0  PLT 312   Cardiac Enzymes: No results for input(s): CKTOTAL, CKMB, CKMBINDEX, TROPONINI in the last 168 hours.  BNP (last 3 results) No results for input(s): BNP in the last 8760 hours.  ProBNP (last 3 results) No results for input(s): PROBNP in the last 8760 hours.  CBG: No results for input(s): GLUCAP in the last 168  hours.  Radiological Exams on Admission: Dg Chest 2 View  Result Date: 04/03/2018 CLINICAL DATA:  Chest pain and shortness of breath today, shortness of breath is worse with exertion EXAM: CHEST - 2 VIEW COMPARISON:  Portable chest x-ray of 03/21/2018 and chest x-ray of 05/01/2015 FINDINGS: No active infiltrate or effusion is seen. Mediastinal and hilar contours are unremarkable. The heart is within upper limits of normal. No bony abnormality is seen. IMPRESSION: No active cardiopulmonary disease. Electronically Signed   By: Dwyane Dee M.D.   On: 04/03/2018 11:43   Ct Angio Chest Pe W And/or Wo Contrast  Result Date: 04/03/2018 CLINICAL DATA:  PE suspected. Positive D-dimer. Intermediate probability. EXAM: CT ANGIOGRAPHY CHEST WITH  CONTRAST TECHNIQUE: Multidetector CT imaging of the chest was performed using the standard protocol during bolus administration of intravenous contrast. Multiplanar CT image reconstructions and MIPs were obtained to evaluate the vascular anatomy. CONTRAST:  ISOVUE-370 IOPAMIDOL (ISOVUE-370) INJECTION 76% NOTE: Infiltration of the IV contrast material occurred within the left antecubital fossa. According to the technologist approximately 20 cc of contrast infiltrated into the surrounding soft tissues. I was personally contacted at the time of the infiltration and advised of this incident. The technologist was advised to follow usual protocol according to amount infiltrated. Per technologist comments the patient's provider, Dr. Juleen China, was also notified of the infiltration and he inspected the infiltration site. COMPARISON:  None. FINDINGS: Cardiovascular: Mildly diminished exam detail due to diminished IV contrast material within the pulmonary arteries. Mild respiratory motion artifact also diminishes exam detail within the lower lobe segmental pulmonary arteries.The main pulmonary artery however appears patent. No saddle embolus. The lobar and segmental pulmonary arteries  also appear patent without evidence for acute pulmonary embolus. Mild aortic atherosclerosis. Calcification in the LAD coronary artery identified. Mediastinum/Nodes: Normal appearance of the thyroid gland. The trachea appears patent and is midline. Small hiatal hernia. No mediastinal, hilar, axillary or supraclavicular adenopathy. Lungs/Pleura: No pleural effusion. Small subpleural nodule identified within the left upper lobe measuring 4 mm. Upper Abdomen: No acute abnormality. Musculoskeletal: No chest wall abnormality. No acute or significant osseous findings. Review of the MIP images confirms the above findings. IMPRESSION: 1. No evidence for acute pulmonary embolus. 2.  Aortic Atherosclerosis (ICD10-I70.0). 3. Lad coronary artery atherosclerotic calcifications. 4. Left upper lobe pulmonary nodule measures 4 mm. No follow-up needed if patient is low-risk. Non-contrast chest CT can be considered in 12 months if patient is high-risk. This recommendation follows the consensus statement: Guidelines for Management of Incidental Pulmonary Nodules Detected on CT Images: From the Fleischner Society 2017; Radiology 2017; 284:228-243. Electronically Signed   By: Signa Kell M.D.   On: 04/03/2018 16:08    EKG: I independently viewed the EKG done and my findings are as followed: Sinus rhythm with rate of 72 with no acute or specific ST-T changes.  Assessment/Plan Present on Admission: . Chest pain  Active Problems:   Chest pain  Chest pain rule out ACS First set of troponin negative Twelve-lead EKG unremarkable.  Independently reviewed twelve-lead EKG which revealed sinus rhythm with rate of 72 with no specific or acute ST-T changes. Chest x-ray unremarkable for any acute findings.  Independently reviewed chest x-ray which revealed clear lungs with no infiltrates or increase in pulmonary vascularity. Cycle troponin x3 Monitor on telemetry Morphine as needed for chest pain If positive troponin or ST-T  changes consult cardiology  Elevated d-dimer D-dimer 1.35 CT angios PE unremarkable for PE Incidentally found left upper lobe pulmonary nodule measuring 4 mm, follow-up CT chest recommended in 12 months  Accelerated hypertension Resume home antihypertensive medications IV hydralazine 10 mg every 6 hours PRN for systolic blood pressure greater than 180 or diastolic blood pressure greater than 105 Continue to monitor vital signs  Type 2 diabetes uncontrolled with hyperglycemia Last A1c 5.78 2519 A1c 6.9 on 10-16 Hyperglycemic in the ED Start insulin sliding scale Avoid tight control of blood sugar to avoid hypoglycemia  Hypothyroidism Resume levothyroxine  History of anxiety No anxiety medication noted in her home list of meds Needs to follow-up with PCP outpatient Treat as needed   DVT prophylaxis: Subcu Lovenox daily  Code Status: DNR  Family Communication: None at bedside  Disposition  Plan: Admit to telemetry unit  Consults called: None  Admission status: Observation status    Darlin Drop MD [facility] Pager [phone]  If 7PM-7AM, please contact night-coverage [email or web address] Password [facility]  04/03/2018, 6:51 PM

## 2018-04-03 NOTE — ED Triage Notes (Signed)
Pt complains of chest pain and shortness of breath today. Shortness of breath is worse upon exertion.

## 2018-04-04 DIAGNOSIS — I10 Essential (primary) hypertension: Secondary | ICD-10-CM | POA: Diagnosis not present

## 2018-04-04 DIAGNOSIS — R079 Chest pain, unspecified: Secondary | ICD-10-CM

## 2018-04-04 LAB — CBC
HEMATOCRIT: 37 % (ref 36.0–46.0)
Hemoglobin: 12.9 g/dL (ref 12.0–15.0)
MCH: 32.9 pg (ref 26.0–34.0)
MCHC: 34.9 g/dL (ref 30.0–36.0)
MCV: 94.4 fL (ref 78.0–100.0)
Platelets: 322 10*3/uL (ref 150–400)
RBC: 3.92 MIL/uL (ref 3.87–5.11)
RDW: 13 % (ref 11.5–15.5)
WBC: 5.5 10*3/uL (ref 4.0–10.5)

## 2018-04-04 LAB — GLUCOSE, CAPILLARY
GLUCOSE-CAPILLARY: 128 mg/dL — AB (ref 70–99)
GLUCOSE-CAPILLARY: 178 mg/dL — AB (ref 70–99)
Glucose-Capillary: 138 mg/dL — ABNORMAL HIGH (ref 70–99)

## 2018-04-04 LAB — COMPREHENSIVE METABOLIC PANEL
ALK PHOS: 50 U/L (ref 38–126)
ALT: 14 U/L (ref 0–44)
ANION GAP: 12 (ref 5–15)
AST: 17 U/L (ref 15–41)
Albumin: 3.8 g/dL (ref 3.5–5.0)
BILIRUBIN TOTAL: 0.8 mg/dL (ref 0.3–1.2)
BUN: 13 mg/dL (ref 8–23)
CALCIUM: 10.3 mg/dL (ref 8.9–10.3)
CO2: 28 mmol/L (ref 22–32)
CREATININE: 0.82 mg/dL (ref 0.44–1.00)
Chloride: 99 mmol/L (ref 98–111)
GFR calc Af Amer: >60 mL/min (ref >60)
GFR calc non Af Amer: >60 mL/min (ref >60)
Glucose, Bld: 137 mg/dL — ABNORMAL HIGH (ref 70–99)
Potassium: 4.5 mmol/L (ref 3.5–5.1)
Sodium: 139 mmol/L (ref 135–145)
TOTAL PROTEIN: 6.7 g/dL (ref 6.5–8.1)

## 2018-04-04 LAB — TROPONIN I: Troponin I: <0.03 ng/mL (ref <0.03)

## 2018-04-04 MED ORDER — ACETAMINOPHEN 325 MG PO TABS
650.0000 mg | ORAL_TABLET | Freq: Four times a day (QID) | ORAL | Status: DC | PRN
  Administered 2018-04-04: 650 mg via ORAL
  Filled 2018-04-04: qty 2

## 2018-04-04 MED ORDER — METFORMIN HCL ER 500 MG PO TB24: 1500.0000 mg | ORAL_TABLET | Freq: Every day | ORAL | 0 refills | Status: AC

## 2018-04-04 NOTE — Discharge Summary (Signed)
Physician Discharge Summary  Kristina [name]  JWJ:[id number]  DOB: 02/20/1946  DOA: 04/03/2018 PCP: Shaune Pollack, MD  Admit date: 04/03/2018 Discharge date: 04/04/2018  Admitted From: Home  Disposition: Home   Recommendations for Outpatient Follow-up:  1. Follow up with PCP in 1 week  2. Please obtain ECHO to evaluate diastolic function and monitor for signs of hypertrophy 3. Check BP and adjust meds as needed    Discharge Condition: Stable  CODE STATUS: Full Code  Diet recommendation: Heart Healthy / Carb Modified   Brief/Interim Summary: For full details see H&P/Progress note, but in brief, Kristina [name] is a 72 year old female with medical history significant for hypertension, diabetes type 2, hypothyroidism and hyperlipidemia presented to the emergency department complaining of chest pain associated with dyspnea and improved with rest.  Upon ED evaluation patient was found to have significantly elevated blood pressure with SBP above 200.  Cardiac work-up was unrevealing including EKG with no ST segment changes, negative troponin x3, and telemetry monitor with no acute events.  D-dimer was found to be slightly elevated, CTA was performed which ruled out PE or pulmonary conditions.  Patient was kept overnight, blood pressure was controlled on pain resolved.  Patient was deemed stable for discharge and follow-up with PCP as an outpatient.  Subjective: Patient seen and examined, she denies chest pain, shortness of breath and palpitation.  No acute events overnight.  Ambulating with no issues.  Blood pressure significantly improved  Discharge Diagnoses/Hospital Course:  Atypical chest pain Felt to be related to accelerated hypertension with SBP above 200s.  ACS work-up unremarkable, EKG with no ST segment changes, negative troponin x3, and telemetry monitor with no acute events.  No signs of pulmonary issues.  Negative chest x-ray and CTA of the chest.  Pain has resolved.  Recommend  echocardiogram as an outpatient to monitor diastolic function and check for hypertrophy and wall motion.  Hypertensive urgency ? Stress related, patient reports compliance with medication however report high salt diet Home antihypertensive medications were resumed, and blood pressure was stable during hospital stay.  Patient was treated with hydralazine IV in the emergency department.  No changes in medication were made and advised to follow-up with PCP and monitor blood pressure sporadically. Continue losartan 100 mg daily, Bystolic 10 mg and amlodipine 5 mg daily.  Type 2 diabetes mellitus well controlled Last A1c 5.7 on 03/23/2018 Resume home medications with no changes. Will resume metformin on 9/7 given recent contrast administration.   IV Contrast extravasation - Left AC fossa region  Left arm improved, minimal erythema, no induration or skin breakdown. Pulses and sensation intact  Continue to monitor   All other chronic medical condition were stable during the hospitalization.  On the day of the discharge the patient's vitals were stable, and no other acute medical condition were reported by patient. the patient was felt safe to be discharge to home.   Discharge Instructions  You were cared for by a hospitalist during your hospital stay. If you have any questions about your discharge medications or the care you received while you were in the hospital after you are discharged, you can call the unit and asked to speak with the hospitalist on call if the hospitalist that took care of you is not available. Once you are discharged, your primary care physician will handle any further medical issues. Please note that NO REFILLS for any discharge medications will be authorized once you are discharged, as it is imperative that you return  to your primary care physician (or establish a relationship with a primary care physician if you do not have one) for your aftercare needs so that they can reassess  your need for medications and monitor your lab values.  Discharge Instructions    Call MD for:  difficulty breathing, headache or visual disturbances   Complete by:  As directed    Call MD for:  extreme fatigue   Complete by:  As directed    Call MD for:  hives   Complete by:  As directed    Call MD for:  persistant dizziness or light-headedness   Complete by:  As directed    Call MD for:  persistant nausea and vomiting   Complete by:  As directed    Call MD for:  redness, tenderness, or signs of infection (pain, swelling, redness, odor or green/yellow discharge around incision site)   Complete by:  As directed    Call MD for:  severe uncontrolled pain   Complete by:  As directed    Call MD for:  temperature >100.4   Complete by:  As directed    Diet - low sodium heart healthy   Complete by:  As directed    Increase activity slowly   Complete by:  As directed      Allergies as of 04/04/2018      Reactions   Ace Inhibitors Cough   Ciprofloxacin Other (See Comments)   Tendinitis in heel and posterior right LE   Doxycycline Other (See Comments)   Pt does not remember what the reaction was - many years ago   Shrimp [shellfish Allergy] Diarrhea, Nausea And Vomiting, Rash      Medication List    STOP taking these medications   mometasone-formoterol 100-5 MCG/ACT Aero Commonly known as:  DULERA     TAKE these medications   acetaminophen 500 MG tablet Commonly known as:  TYLENOL Take 1,000 mg by mouth 2 (two) times daily as needed (pain).   amLODipine 5 MG tablet Commonly known as:  NORVASC Take 5 mg by mouth daily.   aspirin 81 MG tablet Take 81 mg by mouth daily.   BYSTOLIC 10 MG tablet Generic drug:  nebivolol TAKE 1 TABLET (10 MG TOTAL) BY MOUTH DAILY. What changed:  See the new instructions.   CALCIUM PO Take 500 mg by mouth at bedtime.   hyoscyamine 0.125 MG SL tablet Commonly known as:  LEVSIN SL Place 0.125 mg under the tongue 2 (two) times daily.    levothyroxine 25 MCG tablet Commonly known as:  SYNTHROID, LEVOTHROID Take 25 mcg by mouth daily before breakfast.   losartan 100 MG tablet Commonly known as:  COZAAR Take 100 mg by mouth daily.   metFORMIN 500 MG 24 hr tablet Commonly known as:  GLUCOPHAGE-XR Take 3 tablets (1,500 mg total) by mouth daily. Start taking on:  04/05/2018   simvastatin 20 MG tablet Commonly known as:  ZOCOR Take 1 tablet by mouth daily.   tiZANidine 4 MG tablet Commonly known as:  ZANAFLEX Take 4 mg by mouth 3 (three) times daily as needed for muscle spasms.      Follow-up Information    Shaune Pollack, MD. Schedule an appointment as soon as possible for a visit in 1 week(s).   Specialty:  Family Medicine Why:  Hospital follow up  Contact information: [address] [place] [address] [phone]          Allergies  Allergen Reactions  .  Ace Inhibitors Cough  . Ciprofloxacin Other (See Comments)    Tendinitis in heel and posterior right LE  . Doxycycline Other (See Comments)    Pt does not remember what the reaction was - many years ago  . Shrimp [Shellfish Allergy] Diarrhea, Nausea And Vomiting and Rash    Consultations:     Procedures/Studies: Dg Chest 2 View  Result Date: 04/03/2018 CLINICAL DATA:  Chest pain and shortness of breath today, shortness of breath is worse with exertion EXAM: CHEST - 2 VIEW COMPARISON:  Portable chest x-ray of 03/21/2018 and chest x-ray of 05/01/2015 FINDINGS: No active infiltrate or effusion is seen. Mediastinal and hilar contours are unremarkable. The heart is within upper limits of normal. No bony abnormality is seen. IMPRESSION: No active cardiopulmonary disease. Electronically Signed   By: Dwyane Dee M.D.   On: 04/03/2018 11:43   Ct Angio Chest Pe W And/or Wo Contrast  Result Date: 04/03/2018 CLINICAL DATA:  PE suspected. Positive D-dimer. Intermediate probability. EXAM: CT ANGIOGRAPHY CHEST WITH CONTRAST TECHNIQUE:  Multidetector CT imaging of the chest was performed using the standard protocol during bolus administration of intravenous contrast. Multiplanar CT image reconstructions and MIPs were obtained to evaluate the vascular anatomy. CONTRAST:  ISOVUE-370 IOPAMIDOL (ISOVUE-370) INJECTION 76% NOTE: Infiltration of the IV contrast material occurred within the left antecubital fossa. According to the technologist approximately 20 cc of contrast infiltrated into the surrounding soft tissues. I was personally contacted at the time of the infiltration and advised of this incident. The technologist was advised to follow usual protocol according to amount infiltrated. Per technologist comments the patient's provider, Dr. Juleen China, was also notified of the infiltration and he inspected the infiltration site. COMPARISON:  None. FINDINGS: Cardiovascular: Mildly diminished exam detail due to diminished IV contrast material within the pulmonary arteries. Mild respiratory motion artifact also diminishes exam detail within the lower lobe segmental pulmonary arteries.The main pulmonary artery however appears patent. No saddle embolus. The lobar and segmental pulmonary arteries also appear patent without evidence for acute pulmonary embolus. Mild aortic atherosclerosis. Calcification in the LAD coronary artery identified. Mediastinum/Nodes: Normal appearance of the thyroid gland. The trachea appears patent and is midline. Small hiatal hernia. No mediastinal, hilar, axillary or supraclavicular adenopathy. Lungs/Pleura: No pleural effusion. Small subpleural nodule identified within the left upper lobe measuring 4 mm. Upper Abdomen: No acute abnormality. Musculoskeletal: No chest wall abnormality. No acute or significant osseous findings. Review of the MIP images confirms the above findings. IMPRESSION: 1. No evidence for acute pulmonary embolus. 2.  Aortic Atherosclerosis (ICD10-I70.0). 3. Lad coronary artery atherosclerotic  calcifications. 4. Left upper lobe pulmonary nodule measures 4 mm. No follow-up needed if patient is low-risk. Non-contrast chest CT can be considered in 12 months if patient is high-risk. This recommendation follows the consensus statement: Guidelines for Management of Incidental Pulmonary Nodules Detected on CT Images: From the Fleischner Society 2017; Radiology 2017; 284:228-243. Electronically Signed   By: Signa Kell M.D.   On: 04/03/2018 16:08   Dg Chest Portable 1 View  Result Date: 03/21/2018 CLINICAL DATA:  Fever and vomiting EXAM: PORTABLE CHEST 1 VIEW COMPARISON:  May 01, 2015 FINDINGS: No edema or consolidation. Heart is upper normal in size with pulmonary vascularity normal. No adenopathy. There is aortic atherosclerosis. No evident bone lesions. IMPRESSION: Aortic atherosclerosis. No edema or consolidation. Heart upper normal in size. Aortic Atherosclerosis (ICD10-I70.0). Electronically Signed   By: Bretta Bang III M.D.   On: 03/21/2018 12:50   Ct Renal  Stone Study  Result Date: 03/21/2018 CLINICAL DATA:  Bilateral flank pain and abnormal urinalysis. EXAM: CT ABDOMEN AND PELVIS WITHOUT CONTRAST TECHNIQUE: Multidetector CT imaging of the abdomen and pelvis was performed following the standard protocol without IV contrast. COMPARISON:  02/11/2010 FINDINGS: Lower chest: Top-normal size heart without pericardial effusion or thickening. Minimal bibasilar atelectasis. No effusion or pneumothorax. Hepatobiliary: Cholecystectomy.  Unenhanced liver is unremarkable. Pancreas: Atrophic without ductal dilatation or space-occupying mass given limitations of a noncontrast study. Spleen:  Normal size spleen. Adrenals/Urinary Tract: The adrenal glands are normal in size and appearance. Mild nonspecific perinephric fat stranding is identified about both kidneys, left greater than right with mild bilateral hydroureteronephrosis. No obstructing calculus is identified however. Suggestion of mild  posterior bladder wall mucosal thickening is noted, more focally thickened near the left UVJ measuring 12 x 7 mm. Although findings may be post inflammatory or infectious in etiology, a mucosal lesion or potentially a layering debris or clot are not entirely excluded. Clinical correlation and if necessary direct visual correlation may prove useful. Stomach/Bowel: Small hiatal hernia. Decompressed stomach. Normal duodenal sweep and ligament of Treitz position. No bowel obstruction or inflammation. Moderate amount of fecal retention is noted within the colon with scattered colonic diverticulosis but without acute diverticulitis. Vascular/Lymphatic: Mild-to-moderate aortoiliac atherosclerosis. No aneurysm. Reproductive: The uterus and adnexa are unremarkable. Other: No free air nor free fluid. Musculoskeletal: Moderate degenerative disc flattening L2-3 and moderate at L3-4. No acute nor aggressive osseous lesions. IMPRESSION: 1. Nonspecific perinephric fat stranding and mild bilateral hydroureteronephrosis without nephrolithiasis nor ureteral calculi. 2. Slight posterior mucosal thickening suggested of the bladder wall more focally eccentrically thickened adjacent to the left UVJ measuring 12 x 7 mm. A mucosal lesion, clot or focal inflammatory thickening might account for this appearance. Clinical correlation and if necessary, direct visual correlation is recommended. 3. Colonic diverticulosis without acute diverticulitis. 4. Small hiatal hernia. 5. 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 Electronically Signed   By: Tollie Eth M.D.   On: 03/21/2018 15:22    Discharge Exam: Vitals:   04/03/18 2019 04/04/18 0418  BP: 135/62 135/68  Pulse: 71 65  Resp: 18 18  Temp: 97.8 F (36.6 C) 98.1 F (36.7 C)  SpO2: 95% 96%   Vitals:   04/03/18 1449 04/03/18 1744 04/03/18 2019 04/04/18 0418  BP: (!) 170/74 (!) 168/68 135/62 135/68  Pulse: 65 76 71 65  Resp: 11 16 18 18   Temp:  98.7 F (37.1 C) 97.8 F (36.6  C) 98.1 F (36.7 C)  TempSrc:  Oral Oral Oral  SpO2: 99% 98% 95% 96%  Weight:  75.7 kg    Height:  5\' 4"  (1.626 m)      General: Pt is alert, awake, not in acute distress Cardiovascular: RRR, S1/S2 +, no rubs, no gallops Respiratory: CTA bilaterally, no wheezing, no rhonchi Abdominal: Soft, NT, ND, bowel sounds + Extremities: Trace LE edema b/l, Left ac fossa mild erythema, no induration. Pulses intact  The results of significant diagnostics from this hospitalization (including imaging, microbiology, ancillary and laboratory) are listed below for reference.     Microbiology: No results found for this or any previous visit (from the past 240 hour(s)).   Labs: BNP (last 3 results) No results for input(s): BNP in the last 8760 hours. Basic Metabolic Panel: Recent Labs  Lab 04/03/18 1030 04/04/18 0543  NA 140 139  K 4.3 4.5  CL 102 99  CO2 25 28  GLUCOSE 209* 137*  BUN 10 13  CREATININE 0.82 0.82  CALCIUM 10.2 10.3   Liver Function Tests: Recent Labs  Lab 04/04/18 0543  AST 17  ALT 14  ALKPHOS 50  BILITOT 0.8  PROT 6.7  ALBUMIN 3.8   No results for input(s): LIPASE, AMYLASE in the last 168 hours. No results for input(s): AMMONIA in the last 168 hours. CBC: Recent Labs  Lab 04/03/18 1200 04/04/18 0543  WBC 5.9 5.5  HGB 13.3 12.9  HCT 37.7 37.0  MCV 94.0 94.4  PLT 312 322   Cardiac Enzymes: Recent Labs  Lab 04/03/18 1842 04/03/18 2237 04/04/18 0543  TROPONINI <0.03 <0.03 <0.03   BNP: Invalid input(s): POCBNP CBG: Recent Labs  Lab 04/03/18 2015 04/03/18 2337 04/04/18 0415 04/04/18 0716  GLUCAP 244* 119* 128* 138*   D-Dimer Recent Labs    04/03/18 1126  DDIMER 1.35*   Hgb A1c No results for input(s): HGBA1C in the last 72 hours. Lipid Profile Recent Labs    04/03/18 1842  CHOL 126  HDL 41  LDLCALC 53  TRIG 159*  CHOLHDL 3.1   Thyroid function studies Recent Labs    04/03/18 1842  TSH 4.745*   Anemia work up No results  for input(s): VITAMINB12, FOLATE, FERRITIN, TIBC, IRON, RETICCTPCT in the last 72 hours. Urinalysis    Component Value Date/Time   COLORURINE STRAW (A) 03/21/2018 1228   APPEARANCEUR HAZY (A) 03/21/2018 1228   LABSPEC 1.006 03/21/2018 1228   PHURINE 6.0 03/21/2018 1228   GLUCOSEU NEGATIVE 03/21/2018 1228   HGBUR SMALL (A) 03/21/2018 1228   BILIRUBINUR NEGATIVE 03/21/2018 1228   KETONESUR NEGATIVE 03/21/2018 1228   PROTEINUR NEGATIVE 03/21/2018 1228   UROBILINOGEN 0.2 05/01/2015 1551   NITRITE NEGATIVE 03/21/2018 1228   LEUKOCYTESUR LARGE (A) 03/21/2018 1228   Sepsis Labs Invalid input(s): PROCALCITONIN,  WBC,  LACTICIDVEN Microbiology No results found for this or any previous visit (from the past 240 hour(s)).   Time coordinating discharge: 35 minutes  SIGNED:  Latrelle Dodrill, MD  [facility] 04/04/2018, 11:35 AM  Pager please text page via  [email or web address]  Note - This record has been created using [employer]. Chart creation errors have been sought, but may not always have been located. Such creation errors do not reflect on the standard of medical care.

## 2018-04-04 NOTE — Care Management Obs Status (Signed)
MEDICARE OBSERVATION STATUS NOTIFICATION   Patient Details  Name: Kristina [name] MRN: [id number] Date of Birth: Mar 23, 1946   Medicare Observation Status Notification Given:   yes    Geni Bers, RN 04/04/2018, 1:00 PM

## 2018-04-04 NOTE — Progress Notes (Signed)
Patient ID: Kristina [name], female   DOB: [birth year], 72 y.o.   MRN: [id number] Pt's left AC region (post small contrast extrav yesterday) without sig erythema, ecchymosis, skin breakdown, or blistering; minimal induration; site NT; intact distal pulses; no paresthesias.

## 2018-04-11 NOTE — ED Provider Notes (Signed)
[facility] [place] [facility] Provider Note   CSN: [id number] Arrival date & time: 04/03/18  4098     History   Chief Complaint Chief Complaint  Patient presents with  . Chest Pain  . Shortness of Breath    HPI Kristina [name] is a 72 y.o. female.  HPI   72 y.o. female with medical history significant for hypertension, type 2 diabetes, hypothyroidism, hyperlipidemia who presented to ED [facility] from home with complaints of chest pain that started earlier this afternoon while she was shopping at the grocery store.  She describes the pain as dull, substernal, nonradiating associated with dyspnea and improved at rest.  Patient was recently discharged from the hospital less than 2 weeks ago after being admitted for acute pyelonephritis.  She feels like she has been improving from that standpoint.  The pain she is been experiencing today is different than symptoms she was having at that time.  Past Medical History:  Diagnosis Date  . Anxiety   . Asthma   . Diabetes (HCC)   . Diabetes mellitus without complication (HCC)   . Diverticulitis   . GERD (gastroesophageal reflux disease)   . HTN (hypertension)   . Hypercholesteremia   . Hyperlipidemia   . Hypertension   . Obesity   . Rosacea   . Thyroid disease     Patient Active Problem List   Diagnosis Date Noted  . Hypokalemia   . Hypothyroidism   . Acute pyelonephritis 03/22/2018  . Pyelonephritis   . UTI (urinary tract infection) 03/21/2018  . IBS (irritable bowel syndrome) 03/21/2018  . Morbid obesity (HCC) 08/08/2015  . Acute hyponatremia 05/01/2015  . Gastroenteritis, acute 05/01/2015  . Diabetes mellitus type 2, controlled (HCC) 05/01/2015  . Asthma in remission 05/01/2015  . Diarrhea   . Cough variant asthma 04/19/2015  . Acute bronchitis 03/08/2015  . Hypothyroidism, adult 02/19/2015  . Dyspnea 02/18/2015  . Upper airway cough syndrome 01/07/2015  . Chest pain 09/15/2014  . Pre-syncope  12/08/2013  . PVC (premature ventricular contraction) 08/26/2013  . Rosacea   . Anxiety   . Diverticulitis   . Diabetes (HCC)   . Essential hypertension   . Obesity     Past Surgical History:  Procedure Laterality Date  . ACHILLES TENDON REPAIR    . APPENDECTOMY    . CESAREAN SECTION    . CHOLECYSTECTOMY    . hammertoe       OB History    Gravida  0   Para  0   Term  0   Preterm  0   AB  0   Living        SAB  0   TAB  0   Ectopic  0   Multiple      Live Births               Home Medications    Prior to Admission medications   Medication Sig Start Date End Date Taking? Authorizing Provider  acetaminophen (TYLENOL) 500 MG tablet Take 1,000 mg by mouth 2 (two) times daily as needed (pain).   Yes [provider]  amLODipine (NORVASC) 5 MG tablet Take 5 mg by mouth daily. 01/01/18  Yes [provider]  aspirin 81 MG tablet Take 81 mg by mouth daily.   Yes [provider]  BYSTOLIC 10 MG tablet TAKE 1 TABLET (10 MG TOTAL) BY MOUTH DAILY. Patient taking differently: Take 10 mg by mouth daily.  07/15/15  Yes Nyoka Cowden, MD  CALCIUM PO Take 500 mg by mouth at bedtime.   Yes [provider]  hyoscyamine (LEVSIN SL) 0.125 MG SL tablet Place 0.125 mg under the tongue 2 (two) times daily.    Yes [provider]  levothyroxine (SYNTHROID, LEVOTHROID) 25 MCG tablet Take 25 mcg by mouth daily before breakfast.   Yes [provider]  losartan (COZAAR) 100 MG tablet Take 100 mg by mouth daily.   Yes [provider]  simvastatin (ZOCOR) 20 MG tablet Take 1 tablet by mouth daily. 08/05/13  Yes [provider]  tiZANidine (ZANAFLEX) 4 MG tablet Take 4 mg by mouth 3 (three) times daily as needed for muscle spasms.   Yes [provider]  metFORMIN (GLUCOPHAGE-XR) 500 MG 24 hr tablet Take 3 tablets (1,500 mg total) by mouth daily. 04/05/18   Lenox Ponds, MD    Family History Family  History  Problem Relation Age of Onset  . Hypertension Mother   . Heart failure Mother   . Hypotension Father   . Dementia Father     Social History Social History   Tobacco Use  . Smoking status: Never Smoker  . Smokeless tobacco: Never Used  Substance Use Topics  . Alcohol use: No    Alcohol/week: 0.0 standard drinks  . Drug use: No     Allergies   Ace inhibitors; Ciprofloxacin; Doxycycline; and Shrimp [shellfish allergy]   Review of Systems Review of Systems  All systems reviewed and negative, other than as noted in HPI.  Physical Exam Updated Vital Signs BP 135/68 (BP Location: Left Arm)   Pulse 65   Temp 98.1 F (36.7 C) (Oral)   Resp 18   Ht 5\' 4"  (1.626 m)   Wt 75.7 kg   SpO2 96%   BMI 28.65 kg/m   Physical Exam  Constitutional: She appears well-developed and well-nourished. No distress.  HENT:  Head: Normocephalic and atraumatic.  Eyes: Conjunctivae are normal. Right eye exhibits no discharge. Left eye exhibits no discharge.  Neck: Neck supple.  Cardiovascular: Normal rate, regular rhythm and normal heart sounds. Exam reveals no gallop and no friction rub.  No murmur heard. Pulmonary/Chest: Effort normal and breath sounds normal. No respiratory distress.  Abdominal: Soft. She exhibits no distension. There is no tenderness.  Musculoskeletal: She exhibits no edema or tenderness.  Neurological: She is alert.  Skin: Skin is warm and dry.  Psychiatric: She has a normal mood and affect. Her behavior is normal. Thought content normal.  Nursing note and vitals reviewed.    ED Treatments / Results  Labs (all labs ordered are listed, but only abnormal results are displayed) Labs Reviewed  BASIC METABOLIC PANEL - Abnormal; Notable for the following components:      Result Value   Glucose, Bld 209 (*)    All other components within normal limits  D-DIMER, QUANTITATIVE (NOT AT [facility]) - Abnormal; Notable for the following components:   D-Dimer, Quant  1.35 (*)    All other components within normal limits  TSH - Abnormal; Notable for the following components:   TSH 4.745 (*)    All other components within normal limits  LIPID PANEL - Abnormal; Notable for the following components:   Triglycerides 159 (*)    All other components within normal limits  COMPREHENSIVE METABOLIC PANEL - Abnormal; Notable for the following components:   Glucose, Bld 137 (*)    All other components within normal limits  GLUCOSE, CAPILLARY -  Abnormal; Notable for the following components:   Glucose-Capillary 244 (*)    All other components within normal limits  GLUCOSE, CAPILLARY - Abnormal; Notable for the following components:   Glucose-Capillary 119 (*)    All other components within normal limits  GLUCOSE, CAPILLARY - Abnormal; Notable for the following components:   Glucose-Capillary 128 (*)    All other components within normal limits  GLUCOSE, CAPILLARY - Abnormal; Notable for the following components:   Glucose-Capillary 138 (*)    All other components within normal limits  GLUCOSE, CAPILLARY - Abnormal; Notable for the following components:   Glucose-Capillary 178 (*)    All other components within normal limits  CBC  TROPONIN I  TROPONIN I  TROPONIN I  CBC  I-STAT TROPONIN, ED    EKG EKG Interpretation  Date/Time:  Thursday April 03 2018 10:21:54 EDT Ventricular Rate:  80 PR Interval:    QRS Duration: 78 QT Interval:  365 QTC Calculation: 421 R Axis:   43 Text Interpretation:  Sinus rhythm Confirmed by Raeford Razor [id number]) on 04/03/2018 11:24:52 AM   Radiology No results found.   Dg Chest 2 View  Result Date: 04/03/2018 CLINICAL DATA:  Chest pain and shortness of breath today, shortness of breath is worse with exertion EXAM: CHEST - 2 VIEW COMPARISON:  Portable chest x-ray of 03/21/2018 and chest x-ray of 05/01/2015 FINDINGS: No active infiltrate or effusion is seen. Mediastinal and hilar contours are unremarkable. The heart  is within upper limits of normal. No bony abnormality is seen. IMPRESSION: No active cardiopulmonary disease. Electronically Signed   By: Dwyane Dee M.D.   On: 04/03/2018 11:43   Ct Angio Chest Pe W And/or Wo Contrast  Result Date: 04/03/2018 CLINICAL DATA:  PE suspected. Positive D-dimer. Intermediate probability. EXAM: CT ANGIOGRAPHY CHEST WITH CONTRAST TECHNIQUE: Multidetector CT imaging of the chest was performed using the standard protocol during bolus administration of intravenous contrast. Multiplanar CT image reconstructions and MIPs were obtained to evaluate the vascular anatomy. CONTRAST:  ISOVUE-370 IOPAMIDOL (ISOVUE-370) INJECTION 76% NOTE: Infiltration of the IV contrast material occurred within the left antecubital fossa. According to the technologist approximately 20 cc of contrast infiltrated into the surrounding soft tissues. I was personally contacted at the time of the infiltration and advised of this incident. The technologist was advised to follow usual protocol according to amount infiltrated. Per technologist comments the patient's provider, Dr. Juleen China, was also notified of the infiltration and he inspected the infiltration site. COMPARISON:  None. FINDINGS: Cardiovascular: Mildly diminished exam detail due to diminished IV contrast material within the pulmonary arteries. Mild respiratory motion artifact also diminishes exam detail within the lower lobe segmental pulmonary arteries.The main pulmonary artery however appears patent. No saddle embolus. The lobar and segmental pulmonary arteries also appear patent without evidence for acute pulmonary embolus. Mild aortic atherosclerosis. Calcification in the LAD coronary artery identified. Mediastinum/Nodes: Normal appearance of the thyroid gland. The trachea appears patent and is midline. Small hiatal hernia. No mediastinal, hilar, axillary or supraclavicular adenopathy. Lungs/Pleura: No pleural effusion. Small subpleural nodule  identified within the left upper lobe measuring 4 mm. Upper Abdomen: No acute abnormality. Musculoskeletal: No chest wall abnormality. No acute or significant osseous findings. Review of the MIP images confirms the above findings. IMPRESSION: 1. No evidence for acute pulmonary embolus. 2.  Aortic Atherosclerosis (ICD10-I70.0). 3. Lad coronary artery atherosclerotic calcifications. 4. Left upper lobe pulmonary nodule measures 4 mm. No follow-up needed if patient is low-risk. Non-contrast chest CT can  be considered in 12 months if patient is high-risk. This recommendation follows the consensus statement: Guidelines for Management of Incidental Pulmonary Nodules Detected on CT Images: From the Fleischner Society 2017; Radiology 2017; 284:228-243. Electronically Signed   By: Signa Kell M.D.   On: 04/03/2018 16:08   Dg Chest Portable 1 View  Result Date: 03/21/2018 CLINICAL DATA:  Fever and vomiting EXAM: PORTABLE CHEST 1 VIEW COMPARISON:  May 01, 2015 FINDINGS: No edema or consolidation. Heart is upper normal in size with pulmonary vascularity normal. No adenopathy. There is aortic atherosclerosis. No evident bone lesions. IMPRESSION: Aortic atherosclerosis. No edema or consolidation. Heart upper normal in size. Aortic Atherosclerosis (ICD10-I70.0). Electronically Signed   By: Bretta Bang III M.D.   On: 03/21/2018 12:50   Ct Renal Stone Study  Result Date: 03/21/2018 CLINICAL DATA:  Bilateral flank pain and abnormal urinalysis. EXAM: CT ABDOMEN AND PELVIS WITHOUT CONTRAST TECHNIQUE: Multidetector CT imaging of the abdomen and pelvis was performed following the standard protocol without IV contrast. COMPARISON:  02/11/2010 FINDINGS: Lower chest: Top-normal size heart without pericardial effusion or thickening. Minimal bibasilar atelectasis. No effusion or pneumothorax. Hepatobiliary: Cholecystectomy.  Unenhanced liver is unremarkable. Pancreas: Atrophic without ductal dilatation or space-occupying  mass given limitations of a noncontrast study. Spleen:  Normal size spleen. Adrenals/Urinary Tract: The adrenal glands are normal in size and appearance. Mild nonspecific perinephric fat stranding is identified about both kidneys, left greater than right with mild bilateral hydroureteronephrosis. No obstructing calculus is identified however. Suggestion of mild posterior bladder wall mucosal thickening is noted, more focally thickened near the left UVJ measuring 12 x 7 mm. Although findings may be post inflammatory or infectious in etiology, a mucosal lesion or potentially a layering debris or clot are not entirely excluded. Clinical correlation and if necessary direct visual correlation may prove useful. Stomach/Bowel: Small hiatal hernia. Decompressed stomach. Normal duodenal sweep and ligament of Treitz position. No bowel obstruction or inflammation. Moderate amount of fecal retention is noted within the colon with scattered colonic diverticulosis but without acute diverticulitis. Vascular/Lymphatic: Mild-to-moderate aortoiliac atherosclerosis. No aneurysm. Reproductive: The uterus and adnexa are unremarkable. Other: No free air nor free fluid. Musculoskeletal: Moderate degenerative disc flattening L2-3 and moderate at L3-4. No acute nor aggressive osseous lesions. IMPRESSION: 1. Nonspecific perinephric fat stranding and mild bilateral hydroureteronephrosis without nephrolithiasis nor ureteral calculi. 2. Slight posterior mucosal thickening suggested of the bladder wall more focally eccentrically thickened adjacent to the left UVJ measuring 12 x 7 mm. A mucosal lesion, clot or focal inflammatory thickening might account for this appearance. Clinical correlation and if necessary, direct visual correlation is recommended. 3. Colonic diverticulosis without acute diverticulitis. 4. Small hiatal hernia. 5. 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 Electronically Signed   By: Tollie Eth M.D.   On: 03/21/2018 15:22     Procedures Procedures (including critical care time)  Medications Ordered in ED Medications  iopamidol (ISOVUE-370) 76 % injection (has no administration in time range)  iopamidol (ISOVUE-370) 76 % injection 100 mL (100 mLs Intravenous Contrast Given 04/03/18 1511)     Initial Impression / Assessment and Plan / ED Course  I have reviewed the triage vital signs and the nursing notes.  Pertinent labs & imaging results that were available during my care of the patient were reviewed by me and considered in my medical decision making (see chart for details).     72 year old female with chest pain.  Both typical and atypical features of ACS.  I do not feel  she is low enough risk for DC. Will admit for r/o.   Final Clinical Impressions(s) / ED Diagnoses   Final diagnoses:  Chest pain, unspecified type    ED Discharge Orders         Ordered    metFORMIN (GLUCOPHAGE-XR) 500 MG 24 hr tablet  Daily     04/04/18 1135    Increase activity slowly     04/04/18 1135    Diet - low sodium heart healthy     04/04/18 1135    Call MD for:  temperature >100.4     04/04/18 1135    Call MD for:  persistant nausea and vomiting     04/04/18 1135    Call MD for:  severe uncontrolled pain     04/04/18 1135    Call MD for:  redness, tenderness, or signs of infection (pain, swelling, redness, odor or green/yellow discharge around incision site)     04/04/18 1135    Call MD for:  difficulty breathing, headache or visual disturbances     04/04/18 1135    Call MD for:  hives     04/04/18 1135    Call MD for:  persistant dizziness or light-headedness     04/04/18 1135    Call MD for:  extreme fatigue     04/04/18 1135           Raeford RazorKohut, Khyleigh Furney, MD 04/11/18 1516

## 2018-06-10 ENCOUNTER — Encounter: Payer: Self-pay | Admitting: Medical

## 2018-06-10 ENCOUNTER — Encounter (INDEPENDENT_AMBULATORY_CARE_PROVIDER_SITE_OTHER): Payer: Self-pay

## 2018-06-10 ENCOUNTER — Ambulatory Visit: Payer: Medicare Other | Admitting: Medical

## 2018-06-10 VITALS — BP 170/90 | HR 67 | Ht 64.0 in | Wt 172.4 lb

## 2018-06-10 DIAGNOSIS — E119 Type 2 diabetes mellitus without complications: Secondary | ICD-10-CM | POA: Diagnosis not present

## 2018-06-10 DIAGNOSIS — Z794 Long term (current) use of insulin: Secondary | ICD-10-CM

## 2018-06-10 DIAGNOSIS — R079 Chest pain, unspecified: Secondary | ICD-10-CM | POA: Diagnosis not present

## 2018-06-10 DIAGNOSIS — I10 Essential (primary) hypertension: Secondary | ICD-10-CM | POA: Diagnosis not present

## 2018-06-10 DIAGNOSIS — E785 Hyperlipidemia, unspecified: Secondary | ICD-10-CM | POA: Diagnosis not present

## 2018-06-10 MED ORDER — AMLODIPINE BESYLATE 10 MG PO TABS: 10.0000 mg | ORAL_TABLET | Freq: Every day | ORAL | 3 refills | Status: DC

## 2018-06-10 NOTE — Progress Notes (Signed)
Cardiology Office Note   Date:  06/10/2018   ID:  Kristina [name], DOB 04/13/1946, MRN [id number]  PCP:  Shaune PollackGates, Donna, MD  Cardiologist:  Charlton HawsPeter Nishan, MD EP: None  Chief Complaint  Patient presents with  . Follow-up    HTN      History of Present Illness: Kristina [name] is a 72 y.o. female with a PMH of labile HTN, HLD, DM type 2 on insulin, and hypothyroidism, who presents for follow-up of HTN and chest pain.   She was last seen by Dr. Eden EmmsNishan 04/2017 and was doing well at that time with improved BP control on higher dose of amlodipine. She was admitted to the hospital 04/03/18-04/04/18 with complaints of chest pain where she was found to have SBP >200. It was felt hypertensive urgency was related to stress and dietary indiscretions. BP improved with IV hydralazine and patient was discharged on home regimen without adjustments.  She presents today for routine follow-up of her HTN. She reports SBP primarily in the 140s at home. She states her PCP reduced her amlodipine to 5mg  daily as she was experiencing mild ankle edema which was not bothersome. Prior to her admission 03/2018, she reported multiple episodes of exertional chest pain. Since that time she has only experienced a few episodes of chest discomfort which last for less than 1 minute at a time. CT chest during 03/2018 admission showed calcifications in the LAD. She denies SOB, orthopnea, PND, LE edema, dizziness, lightheadedness, or syncope.   Past Medical History:  Diagnosis Date  . Anxiety   . Asthma   . Diabetes (HCC)   . Diabetes mellitus without complication (HCC)   . Diverticulitis   . GERD (gastroesophageal reflux disease)   . HTN (hypertension)   . Hypercholesteremia   . Hyperlipidemia   . Hypertension   . Obesity   . Rosacea   . Thyroid disease     Past Surgical History:  Procedure Laterality Date  . ACHILLES TENDON REPAIR    . APPENDECTOMY    . CESAREAN SECTION    . CHOLECYSTECTOMY    . hammertoe         Current Outpatient Medications  Medication Sig Dispense Refill  . acetaminophen (TYLENOL) 500 MG tablet Take 1,000 mg by mouth 2 (two) times daily as needed (pain).    Marland Kitchen. amLODipine (NORVASC) 10 MG tablet Take 1 tablet (10 mg total) by mouth daily. 90 tablet 3  . aspirin 81 MG tablet Take 81 mg by mouth daily.    Marland Kitchen. BYSTOLIC 10 MG tablet TAKE 1 TABLET (10 MG TOTAL) BY MOUTH DAILY. (Patient taking differently: Take 10 mg by mouth daily. ) 30 tablet 5  . CALCIUM PO Take 500 mg by mouth at bedtime.    . hyoscyamine (LEVSIN SL) 0.125 MG SL tablet Place 0.125 mg under the tongue every 4 (four) hours as needed for cramping.     Marland Kitchen. levothyroxine (SYNTHROID, LEVOTHROID) 25 MCG tablet Take 25 mcg by mouth daily before breakfast.    . losartan (COZAAR) 100 MG tablet Take 100 mg by mouth daily.    . metFORMIN (GLUCOPHAGE-XR) 500 MG 24 hr tablet Take 3 tablets (1,500 mg total) by mouth daily.  0  . Probiotic Product (ALIGN PO) Take 1 tablet by mouth daily.    . simvastatin (ZOCOR) 20 MG tablet Take 1 tablet by mouth daily.    Marland Kitchen. tiZANidine (ZANAFLEX) 4 MG tablet Take 4 mg by mouth 3 (three) times daily as  needed for muscle spasms.     No current facility-administered medications for this visit.     Allergies:   Ace inhibitors; Ciprofloxacin; Doxycycline; and Shrimp [shellfish allergy]    Social History:  The patient  reports that she has never smoked. She has never used smokeless tobacco. She reports that she does not drink alcohol or use drugs.   Family History:  The patient's family history includes Dementia in her father; Heart failure in her mother; Hypertension in her mother; Hypotension in her father.    ROS:  Please see the history of present illness.   Otherwise, review of systems are positive for none.   All other systems are reviewed and negative.    PHYSICAL EXAM: VS:  BP (!) 170/90 (BP Location: Left Arm, Patient Position: Sitting, Cuff Size: Normal)   Pulse 67   Ht 5\' 4"  (1.626  m)   Wt 172 lb 6.4 oz (78.2 kg)   SpO2 96% Comment: at rest  BMI 29.59 kg/m  , BMI Body mass index is 29.59 kg/m. GEN: Well nourished, well developed, in no acute distress HEENT: sclera anicteric Neck: no JVD, carotid bruits, or masses Cardiac: RRR; no murmurs, rubs, or gallops,no edema  Respiratory:  clear to auscultation bilaterally, normal work of breathing GI: soft, nontender, nondistended, + BS MS: no deformity or atrophy Skin: warm and dry, no rash Neuro:  Strength and sensation are intact Psych: euthymic mood, full affect   EKG:  EKG is not ordered today.   Recent Labs: 03/23/2018: Magnesium 2.3 04/03/2018: TSH 4.745 04/04/2018: ALT 14; BUN 13; Creatinine, Ser 0.82; Hemoglobin 12.9; Platelets 322; Potassium 4.5; Sodium 139    Lipid Panel    Component Value Date/Time   CHOL 126 04/03/2018 1842   TRIG 159 (H) 04/03/2018 1842   HDL 41 04/03/2018 1842   CHOLHDL 3.1 04/03/2018 1842   VLDL 32 04/03/2018 1842   LDLCALC 53 04/03/2018 1842      Wt Readings from Last 3 Encounters:  06/10/18 172 lb 6.4 oz (78.2 kg)  04/03/18 166 lb 14.2 oz (75.7 kg)  03/21/18 175 lb (79.4 kg)      Other studies Reviewed: Additional studies/ records that were reviewed today include:   NST 02/2017:  Nuclear stress EF: 51%.  There was no ST segment deviation noted during stress.  No T wave inversion was noted during stress.  The study is normal.  This is a low risk study.   Low risk stress nuclear study with normal perfusion and low normal left ventricular global systolic function.  Echocardiogram 02/2017: Study Conclusions  - Left ventricle: The cavity size was normal. Systolic function was   normal. The estimated ejection fraction was in the range of 55%   to 60%. Wall motion was normal; there were no regional wall   motion abnormalities. Features are consistent with a pseudonormal   left ventricular filling pattern, with concomitant abnormal   relaxation and increased  filling pressure (grade 2 diastolic   dysfunction). Doppler parameters are consistent with high   ventricular filling pressure. - Aortic valve: Mildly calcified annulus. Trileaflet; normal   thickness leaflets. - Mitral valve: There was mild regurgitation. - Left atrium: The atrium was mildly dilated. Anterior-posterior   dimension: 45 mm.    ASSESSMENT AND PLAN:  1. Chest pain: patient reported exertional chest pain a/w SOB 03/2018 felt to be 2/2 hypertensive urgency. She reports a few episodes since that time. Last ischemic evaluation was a NST 2018 which was negative  for ischemia, however recent CT chest 03/2018 with LAD calcifications. Mother died from an MI in her 60s and son had stent placed in his 55s.  - Will check a coronary CTA to further evaluate for ischemia - Continue aspirin and statin  2. Labile HTN: BP elevated to 170/90. Amlodipine decreased to 5mg  daily for LE edema which patient states was not bothersome.  - Will increase amlodipine back to 10mg  daily - patient is agreeable  - Continue losartan and nebivolol  3. HLD: LDL 53 03/2018 - Continue statin  4. DM type 2: Last A1C 5.7 02/2018; at goal of <7 - Continue current regimen and close follow-up with PCP   Current medicines are reviewed at length with the patient today.  The patient does not have concerns regarding medicines.  The following changes have been made:  Amlodipine increased to 10mg  daily  Labs/ tests ordered today include: BMET today in preparation for coronary CTA  Orders Placed This Encounter  Procedures  . CT CORONARY MORPH W/CTA COR W/SCORE W/CA W/CM &/OR WO/CM  . CT CORONARY FRACTIONAL FLOW RESERVE DATA PREP  . CT CORONARY FRACTIONAL FLOW RESERVE FLUID ANALYSIS  . Basic metabolic panel     Disposition:   FU with Dr. Eden Emms in 4 months  Signed, Beatriz Stallion, PA-C  06/10/2018 4:30 PM

## 2018-06-10 NOTE — Patient Instructions (Addendum)
We will be checking the following labs today - BMET in preparation for upcoming CT scan   Medication Instructions:   - Increase amlodipine to 10mg  daily   Testing/Procedures To Be Arranged:  Coronary CT scan to evaluate for blockages in the arteries around your heart  Follow-Up:   See Dr. Eden Emms in 4 months    Other Special Instructions:   N/A    If you need a refill on your cardiac medications before your next appointment, please call your pharmacy.   Call the [facility] office at [phone] if you have any questions, problems or concerns.   Please arrive at the [facility] main entrance of [facility] at xx:xx AM (30-45 minutes prior to test start time)  [facility] [address] [place], [place] [address] [phone]  Proceed to the [facility] Radiology Department (First Floor).  Please follow these instructions carefully (unless otherwise directed):  On the Night Before the Test: . Be sure to Drink plenty of water. . Do not consume any caffeinated/decaffeinated beverages or chocolate 12 hours prior to your test. . Do not take any antihistamines 12 hours prior to your test. . If you take Metformin do not take 24 hours prior to test.  On the Day of the Test: . Drink plenty of water. Do not drink any water within one hour of the test. . Do not eat any food 4 hours prior to the test. . You may take your regular medications prior to the test.        After the Test: . Drink plenty of water. . After receiving IV contrast, you may experience a mild flushed feeling. This is normal. . On occasion, you may experience a mild rash up to 24 hours after the test. This is not dangerous. If this occurs, you can take Benadryl 25 mg and increase your fluid intake. . If you experience trouble breathing, this can be serious. If it is severe call 911 IMMEDIATELY. If it is mild, please call our office. . If you take any of  these medications: Glipizide/Metformin, Avandament, Glucavance, please do not take 48 hours after completing test.   DASH Eating Plan DASH stands for "Dietary Approaches to Stop Hypertension." The DASH eating plan is a healthy eating plan that has been shown to reduce high blood pressure (hypertension). It may also reduce your risk for type 2 diabetes, heart disease, and stroke. The DASH eating plan may also help with weight loss. What are tips for following this plan? General guidelines  Avoid eating more than 2,300 mg (milligrams) of salt (sodium) a day. If you have hypertension, you may need to reduce your sodium intake to 1,500 mg a day.  Limit alcohol intake to no more than 1 drink a day for nonpregnant women and 2 drinks a day for men. One drink equals 12 oz of beer, 5 oz of wine, or 1 oz of hard liquor.  Work with your health care provider to maintain a healthy body weight or to lose weight. Ask what an ideal weight is for you.  Get at least 30 minutes of exercise that causes your heart to beat faster (aerobic exercise) most days of the week. Activities may include walking, swimming, or biking.  Work with your health care provider or diet and nutrition specialist (dietitian) to adjust your eating plan to your individual calorie needs. Reading food labels  Check food labels for the amount of sodium per serving.  Choose foods with less than 5 percent of the Daily Value of sodium. Generally, foods with less than 300 mg of sodium per serving fit into this eating plan.  To find whole grains, look for the word "whole" as the first word in the ingredient list. Shopping  Buy products labeled as "low-sodium" or "no salt added."  Buy fresh foods. Avoid canned foods and premade or frozen meals. Cooking  Avoid adding salt when cooking. Use salt-free seasonings or herbs instead of table salt or sea salt. Check with your health care provider or pharmacist before using salt substitutes.  Do  not fry foods. Cook foods using healthy methods such as baking, boiling, grilling, and broiling instead.  Cook with heart-healthy oils, such as olive, canola, soybean, or sunflower oil. Meal planning   Eat a balanced diet that includes: ? 5 or more servings of fruits and vegetables each day. At each meal, try to fill half of your plate with fruits and vegetables. ? Up to 6-8 servings of whole grains each day. ? Less than 6 oz of lean meat, poultry, or fish each day. A 3-oz serving of meat is about the same size as a deck of cards. One egg equals 1 oz. ? 2 servings of low-fat dairy each day. ? A serving of nuts, seeds, or beans 5 times each week. ? Heart-healthy fats. Healthy fats called Omega-3 fatty acids are found in foods such as flaxseeds and coldwater fish, like sardines, salmon, and mackerel.  Limit how much you eat of the following: ? Canned or prepackaged foods. ? Food that is high in trans fat, such as fried foods. ? Food that is high in saturated fat, such as fatty meat. ? Sweets, desserts, sugary drinks, and other foods with added sugar. ? Full-fat dairy products.  Do not salt foods before eating.  Try to eat at least 2 vegetarian meals each week.  Eat more home-cooked food and less restaurant, buffet, and fast food.  When eating at a restaurant, ask that your food be prepared with less salt or no salt, if possible. What foods are recommended? The items listed may not be a complete list. Talk with your dietitian about what dietary choices are best for you. Grains Whole-grain or whole-wheat bread. Whole-grain or whole-wheat pasta. Brown rice. Orpah Cobb. Bulgur. Whole-grain and low-sodium cereals. Pita bread. Low-fat, low-sodium crackers. Whole-wheat flour tortillas. Vegetables Fresh or frozen vegetables (raw, steamed, roasted, or grilled). Low-sodium or reduced-sodium tomato and vegetable juice. Low-sodium or reduced-sodium tomato sauce and tomato paste. Low-sodium or  reduced-sodium canned vegetables. Fruits All fresh, dried, or frozen fruit. Canned fruit in natural juice (without added sugar). Meat and other protein foods Skinless chicken or [place]. Ground chicken or [place]. Pork with fat trimmed off. Fish and seafood. Egg whites. Dried beans, peas, or lentils. Unsalted nuts, nut butters, and seeds. Unsalted canned beans. Lean cuts of beef with fat trimmed off. Low-sodium, lean deli meat. Dairy Low-fat (1%) or fat-free (skim) milk. Fat-free, low-fat, or reduced-fat cheeses. Nonfat, low-sodium ricotta or cottage cheese. Low-fat or nonfat yogurt. Low-fat, low-sodium cheese. Fats and oils Soft margarine without trans fats. Vegetable oil. Low-fat, reduced-fat, or light mayonnaise and salad dressings (reduced-sodium). Canola, safflower, olive, soybean, and sunflower oils. Avocado. Seasoning and other foods Herbs. Spices. Seasoning mixes without salt. Unsalted popcorn and pretzels. Fat-free sweets. What foods are not recommended? The items listed may not be a complete list. Talk with your dietitian about what dietary choices are best for you. Grains Baked  goods made with fat, such as croissants, muffins, or some breads. Dry pasta or rice meal packs. Vegetables Creamed or fried vegetables. Vegetables in a cheese sauce. Regular canned vegetables (not low-sodium or reduced-sodium). Regular canned tomato sauce and paste (not low-sodium or reduced-sodium). Regular tomato and vegetable juice (not low-sodium or reduced-sodium). Rosita FirePickles. Olives. Fruits Canned fruit in a light or heavy syrup. Fried fruit. Fruit in cream or butter sauce. Meat and other protein foods Fatty cuts of meat. Ribs. Fried meat. Tomasa BlaseBacon. Sausage. Bologna and other processed lunch meats. Salami. Fatback. Hotdogs. Bratwurst. Salted nuts and seeds. Canned beans with added salt. Canned or smoked fish. Whole eggs or egg yolks. Chicken or [place]ur[place] with skin. Dairy Whole or 2% milk, cream, and half-and-half.  Whole or full-fat cream cheese. Whole-fat or sweetened yogurt. Full-fat cheese. Nondairy creamers. Whipped toppings. Processed cheese and cheese spreads. Fats and oils Butter. Stick margarine. Lard. Shortening. Ghee. Bacon fat. Tropical oils, such as coconut, palm kernel, or palm oil. Seasoning and other foods Salted popcorn and pretzels. Onion salt, garlic salt, seasoned salt, table salt, and sea salt. Worcestershire sauce. Tartar sauce. Barbecue sauce. Teriyaki sauce. Soy sauce, including reduced-sodium. Steak sauce. Canned and packaged gravies. Fish sauce. Oyster sauce. Cocktail sauce. Horseradish that you find on the shelf. Ketchup. Mustard. Meat flavorings and tenderizers. Bouillon cubes. Hot sauce and Tabasco sauce. Premade or packaged marinades. Premade or packaged taco seasonings. Relishes. Regular salad dressings. Where to find more information:  National Heart, Lung, and Blood Institute: PopSteam.iswww.nhlbi.nih.gov  American Heart Association: www.heart.org Summary  The DASH eating plan is a healthy eating plan that has been shown to reduce high blood pressure (hypertension). It may also reduce your risk for type 2 diabetes, heart disease, and stroke.  With the DASH eating plan, you should limit salt (sodium) intake to 2,300 mg a day. If you have hypertension, you may need to reduce your sodium intake to 1,500 mg a day.  When on the DASH eating plan, aim to eat more fresh fruits and vegetables, whole grains, lean proteins, low-fat dairy, and heart-healthy fats.  Work with your health care provider or diet and nutrition specialist (dietitian) to adjust your eating plan to your individual calorie needs. This information is not intended to replace advice given to you by your health care provider. Make sure you discuss any questions you have with your health care provider. Document Released: 07/05/2011 Document Revised: 07/09/2016 Document Reviewed: 07/09/2016 Elsevier Interactive Patient Education   [employer]2018 Elsevier Inc.

## 2018-06-11 LAB — BASIC METABOLIC PANEL
BUN/Creatinine Ratio: 22 (ref 12–28)
BUN: 16 mg/dL (ref 8–27)
CALCIUM: 10.2 mg/dL (ref 8.7–10.3)
CO2: 21 mmol/L (ref 20–29)
Chloride: 101 mmol/L (ref 96–106)
Creatinine, Ser: 0.72 mg/dL (ref 0.57–1.00)
GFR, EST AFRICAN AMERICAN: 96 mL/min/{1.73_m2} (ref >59)
GFR, EST NON AFRICAN AMERICAN: 83 mL/min/{1.73_m2} (ref >59)
Glucose: 108 mg/dL — ABNORMAL HIGH (ref 65–99)
Potassium: 4.2 mmol/L (ref 3.5–5.2)
SODIUM: 141 mmol/L (ref 134–144)

## 2018-06-13 ENCOUNTER — Telehealth: Payer: Self-pay

## 2018-06-13 NOTE — Telephone Encounter (Signed)
Patient called about her recent increase in Amlodipine. Patient stated her BP/ HR has been 113/62 HR 76, 95/62 HR 72, and 106/67 HR 83. Patient is taking losartan and amlodipine in the am, and Bystolic at night. Encourage patient to take amlodipine in the morning and losartan in the afternoon to see if this helps keep her BP from getting too low. Will send to Judy PimpleKrista Kroeger PA for further advisement.  Also, will find out if patient needs to take metoprolol the day of her CT and what dose if she does.

## 2018-07-04 ENCOUNTER — Ambulatory Visit (HOSPITAL_COMMUNITY)
Admission: RE | Admit: 2018-07-04 | Discharge: 2018-07-04 | Disposition: A | Discharge status: Home / Self Care | Payer: Medicare Other | Source: Ambulatory Visit | Attending: Medical | Admitting: Medical

## 2018-07-04 DIAGNOSIS — R079 Chest pain, unspecified: Secondary | ICD-10-CM

## 2018-07-04 DIAGNOSIS — K449 Diaphragmatic hernia without obstruction or gangrene: Secondary | ICD-10-CM | POA: Diagnosis not present

## 2018-07-04 DIAGNOSIS — Z006 Encounter for examination for normal comparison and control in clinical research program: Secondary | ICD-10-CM

## 2018-07-04 DIAGNOSIS — I2510 Atherosclerotic heart disease of native coronary artery without angina pectoris: Secondary | ICD-10-CM | POA: Insufficient documentation

## 2018-07-04 DIAGNOSIS — I700 Atherosclerosis of aorta: Secondary | ICD-10-CM | POA: Diagnosis not present

## 2018-07-04 MED ORDER — NITROGLYCERIN 0.4 MG SL SUBL
0.8000 mg | SUBLINGUAL_TABLET | SUBLINGUAL | Status: DC | PRN
  Administered 2018-07-04: 0.8 mg via SUBLINGUAL
  Filled 2018-07-04: qty 25

## 2018-07-04 MED ORDER — NITROGLYCERIN 0.4 MG SL SUBL
SUBLINGUAL_TABLET | SUBLINGUAL | Status: AC
  Filled 2018-07-04: qty 2

## 2018-07-04 MED ORDER — IOPAMIDOL (ISOVUE-370) INJECTION 76%
80.0000 mL | Freq: Once | INTRAVENOUS | Status: AC | PRN
  Administered 2018-07-04: 80 mL via INTRAVENOUS

## 2018-07-04 NOTE — Research (Signed)
Cadfem Informed Consent    Patient Name: Kristina [name]   Subject met inclusion and exclusion criteria.  The informed consent form, study requirements and expectations were reviewed with the subject and questions and concerns were addressed prior to the signing of the consent form.  The subject verbalized understanding of the trail requirements.  The subject agreed to participate in the CADFEM trial and signed the informed consent.  The informed consent was obtained prior to performance of any protocol-specific procedures for the subject.  A copy of the signed informed consent was given to the subject and a copy was placed in the subject's medical record.   Tiara S Woodard  07/04/2018 8:12 AM 

## 2018-07-08 ENCOUNTER — Other Ambulatory Visit: Payer: Self-pay | Admitting: Medical

## 2018-10-01 ENCOUNTER — Encounter: Payer: Self-pay | Admitting: Cardiovascular Disease

## 2018-10-03 NOTE — Progress Notes (Deleted)
Cardiology Office Note   Date:  10/03/2018   ID:  Kristina [name], [name] 1946-02-06, MRN [id number]  PCP:  Shaune Pollack, MD (Inactive)  Cardiologist:  Charlton Haws, MD EP: None  No chief complaint on file.     History of Present Illness: Kristina [name] is a 73 y.o. female with a PMH of labile HTN, HLD, DM type 2 on insulin, and hypothyroidism, who presents for follow-up    She was last seen by me  04/2017 and was doing well at that time with improved BP control on higher dose of amlodipine. She was admitted to the hospital 04/03/18-04/04/18 with complaints of chest pain where she was found to have SBP >200. It was felt hypertensive urgency was related to stress and dietary indiscretions. BP improved with IV hydralazine and patient was discharged on home regimen without adjustments.  She states her PCP reduced her amlodipine to 5mg  daily as she was experiencing mild ankle edema which was not bothersome. Prior to her admission 03/2018, she reported multiple episodes of exertional chest pain.    Cardiac CT done 07/04/18 showed calcium score 122 68 th percentile And non obstructive disease in RCA and LAD    Past Medical History:  Diagnosis Date  . Anxiety   . Asthma   . Diabetes (HCC)   . Diabetes mellitus without complication (HCC)   . Diverticulitis   . GERD (gastroesophageal reflux disease)   . HTN (hypertension)   . Hypercholesteremia   . Hyperlipidemia   . Hypertension   . Obesity   . Rosacea   . Thyroid disease     Past Surgical History:  Procedure Laterality Date  . ACHILLES TENDON REPAIR    . APPENDECTOMY    . CESAREAN SECTION    . CHOLECYSTECTOMY    . hammertoe       Current Outpatient Medications  Medication Sig Dispense Refill  . acetaminophen (TYLENOL) 500 MG tablet Take 1,000 mg by mouth 2 (two) times daily as needed (pain).    Marland Kitchen amLODipine (NORVASC) 10 MG tablet Take 1 tablet (10 mg total) by mouth daily. 90 tablet 3  . aspirin 81 MG tablet Take 81 mg  by mouth daily.    Marland Kitchen BYSTOLIC 10 MG tablet TAKE 1 TABLET (10 MG TOTAL) BY MOUTH DAILY. (Patient taking differently: Take 10 mg by mouth daily. ) 30 tablet 5  . CALCIUM PO Take 500 mg by mouth at bedtime.    . hyoscyamine (LEVSIN SL) 0.125 MG SL tablet Place 0.125 mg under the tongue every 4 (four) hours as needed for cramping.     Marland Kitchen levothyroxine (SYNTHROID, LEVOTHROID) 25 MCG tablet Take 25 mcg by mouth daily before breakfast.    . losartan (COZAAR) 100 MG tablet Take 100 mg by mouth daily.    . metFORMIN (GLUCOPHAGE-XR) 500 MG 24 hr tablet Take 3 tablets (1,500 mg total) by mouth daily.  0  . Probiotic Product (ALIGN PO) Take 1 tablet by mouth daily.    . simvastatin (ZOCOR) 20 MG tablet Take 1 tablet by mouth daily.    Marland Kitchen tiZANidine (ZANAFLEX) 4 MG tablet Take 4 mg by mouth 3 (three) times daily as needed for muscle spasms.     No current facility-administered medications for this visit.     Allergies:   Ace inhibitors; Ciprofloxacin; Doxycycline; and Shrimp [shellfish allergy]    Social History:  The patient  reports that she has never smoked. She has never used smokeless tobacco. She  reports that she does not drink alcohol or use drugs.   Family History:  The patient's family history includes Dementia in her father; Heart failure in her mother; Hypertension in her mother; Hypotension in her father.    ROS:  Please see the history of present illness.   Otherwise, review of systems are positive for none.   All other systems are reviewed and negative.    PHYSICAL EXAM: VS:  There were no vitals taken for this visit. , BMI There is no height or weight on file to calculate BMI. Affect appropriate Healthy:  appears stated age HEENT: normal Neck supple with no adenopathy JVP normal no bruits no thyromegaly Lungs clear with no wheezing and good diaphragmatic motion Heart:  S1/S2 no murmur, no rub, gallop or click PMI normal Abdomen: benighn, BS positve, no tenderness, no AAA no bruit.   No HSM or HJR Distal pulses intact with no bruits No edema Neuro non-focal Skin warm and dry No muscular weakness    EKG: 04/04/18 SR Rate 80 normal    Recent Labs: 03/23/2018: Magnesium 2.3 04/03/2018: TSH 4.745 04/04/2018: ALT 14; Hemoglobin 12.9; Platelets 322 06/10/2018: BUN 16; Creatinine, Ser 0.72; Potassium 4.2; Sodium 141    Lipid Panel    Component Value Date/Time   CHOL 126 04/03/2018 1842   TRIG 159 (H) 04/03/2018 1842   HDL 41 04/03/2018 1842   CHOLHDL 3.1 04/03/2018 1842   VLDL 32 04/03/2018 1842   LDLCALC 53 04/03/2018 1842      Wt Readings from Last 3 Encounters:  06/10/18 78.2 kg  04/03/18 75.7 kg  03/21/18 79.4 kg      Other studies Reviewed:  Cardiac CT 07/04/18 IMPRESSION: 1. Calcium score 122 which is 68 th percentile for age and sex  2.  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  Normal aortic root size 3.1 cm    NST 02/2017:  Nuclear stress EF: 51%.  There was no ST segment deviation noted during stress.  No T wave inversion was noted during stress.  The study is normal.  This is a low risk study.   Low risk stress nuclear study with normal perfusion and low normal left ventricular global systolic function.  Echocardiogram 02/2017: Study Conclusions  - Left ventricle: The cavity size was normal. Systolic function was   normal. The estimated ejection fraction was in the range of 55%   to 60%. Wall motion was normal; there were no regional wall   motion abnormalities. Features are consistent with a pseudonormal   left ventricular filling pattern, with concomitant abnormal   relaxation and increased filling pressure (grade 2 diastolic   dysfunction). Doppler parameters are consistent with high   ventricular filling pressure. - Aortic valve: Mildly calcified annulus. Trileaflet; normal   thickness leaflets. - Mitral valve: There was mild regurgitation. - Left atrium: The atrium was mildly dilated.  Anterior-posterior   dimension: 45 mm.    ASSESSMENT AND PLAN:  1. Chest pain: non obstructive disease in LAD/RCA on cardiac CT 07/04/18 continue medical Rx   2. Labile HTN: continue norvasc, losartan and nebivolol f/u primary   3. HLD: LDL 53 03/2018 on station labs with primary    4. DM type 2: Last A1C 5.7 02/2018; at goal of <7 f/u primary low carb diet     Current medicines are reviewed at length with the patient today.  The patient does not have concerns regarding medicines.  The following changes have been made:  ***  Labs/ tests ordered today include:  ***  No orders of the defined types were placed in this encounter.    Disposition:   FU with Dr. Eden Emms in a year   Signed, Charlton Haws, MD  10/03/2018 3:40 PM

## 2018-10-15 ENCOUNTER — Inpatient Hospital Stay (HOSPITAL_COMMUNITY)
Admission: EM | Admit: 2018-10-15 | Discharge: 2018-10-17 | DRG: 378 | Disposition: A | Discharge status: Home / Self Care | Payer: Medicare Other | Attending: Internal Medicine | Admitting: Internal Medicine

## 2018-10-15 ENCOUNTER — Encounter (HOSPITAL_COMMUNITY): Payer: Self-pay

## 2018-10-15 ENCOUNTER — Other Ambulatory Visit: Payer: Self-pay

## 2018-10-15 DIAGNOSIS — E785 Hyperlipidemia, unspecified: Secondary | ICD-10-CM | POA: Diagnosis not present

## 2018-10-15 DIAGNOSIS — I10 Essential (primary) hypertension: Secondary | ICD-10-CM | POA: Diagnosis present

## 2018-10-15 DIAGNOSIS — Z881 Allergy status to other antibiotic agents status: Secondary | ICD-10-CM

## 2018-10-15 DIAGNOSIS — Z91013 Allergy to seafood: Secondary | ICD-10-CM

## 2018-10-15 DIAGNOSIS — E039 Hypothyroidism, unspecified: Secondary | ICD-10-CM

## 2018-10-15 DIAGNOSIS — E079 Disorder of thyroid, unspecified: Secondary | ICD-10-CM | POA: Diagnosis present

## 2018-10-15 DIAGNOSIS — Z794 Long term (current) use of insulin: Secondary | ICD-10-CM

## 2018-10-15 DIAGNOSIS — L719 Rosacea, unspecified: Secondary | ICD-10-CM | POA: Diagnosis present

## 2018-10-15 DIAGNOSIS — J45909 Unspecified asthma, uncomplicated: Secondary | ICD-10-CM | POA: Diagnosis present

## 2018-10-15 DIAGNOSIS — E875 Hyperkalemia: Secondary | ICD-10-CM | POA: Diagnosis present

## 2018-10-15 DIAGNOSIS — E11649 Type 2 diabetes mellitus with hypoglycemia without coma: Secondary | ICD-10-CM | POA: Diagnosis present

## 2018-10-15 DIAGNOSIS — K529 Noninfective gastroenteritis and colitis, unspecified: Secondary | ICD-10-CM | POA: Diagnosis present

## 2018-10-15 DIAGNOSIS — K5791 Diverticulosis of intestine, part unspecified, without perforation or abscess with bleeding: Principal | ICD-10-CM | POA: Diagnosis present

## 2018-10-15 DIAGNOSIS — Z882 Allergy status to sulfonamides status: Secondary | ICD-10-CM

## 2018-10-15 DIAGNOSIS — Z888 Allergy status to other drugs, medicaments and biological substances status: Secondary | ICD-10-CM

## 2018-10-15 DIAGNOSIS — K648 Other hemorrhoids: Secondary | ICD-10-CM | POA: Diagnosis present

## 2018-10-15 DIAGNOSIS — K922 Gastrointestinal hemorrhage, unspecified: Secondary | ICD-10-CM | POA: Diagnosis present

## 2018-10-15 DIAGNOSIS — E7800 Pure hypercholesterolemia, unspecified: Secondary | ICD-10-CM | POA: Diagnosis present

## 2018-10-15 DIAGNOSIS — D62 Acute posthemorrhagic anemia: Secondary | ICD-10-CM | POA: Diagnosis present

## 2018-10-15 DIAGNOSIS — Z7982 Long term (current) use of aspirin: Secondary | ICD-10-CM

## 2018-10-15 DIAGNOSIS — Z7984 Long term (current) use of oral hypoglycemic drugs: Secondary | ICD-10-CM

## 2018-10-15 DIAGNOSIS — Z8249 Family history of ischemic heart disease and other diseases of the circulatory system: Secondary | ICD-10-CM

## 2018-10-15 DIAGNOSIS — K219 Gastro-esophageal reflux disease without esophagitis: Secondary | ICD-10-CM | POA: Diagnosis present

## 2018-10-15 DIAGNOSIS — E118 Type 2 diabetes mellitus with unspecified complications: Secondary | ICD-10-CM | POA: Diagnosis not present

## 2018-10-15 DIAGNOSIS — Z79890 Hormone replacement therapy (postmenopausal): Secondary | ICD-10-CM

## 2018-10-15 DIAGNOSIS — E119 Type 2 diabetes mellitus without complications: Secondary | ICD-10-CM

## 2018-10-15 LAB — CBC
HCT: 27.5 % — ABNORMAL LOW (ref 36.0–46.0)
HCT: 26.5 % — ABNORMAL LOW (ref 36.0–46.0)
HCT: 24.3 % — ABNORMAL LOW (ref 36.0–46.0)
HEMATOCRIT: 30.1 % — AB (ref 36.0–46.0)
Hemoglobin: 10.5 g/dL — ABNORMAL LOW (ref 12.0–15.0)
Hemoglobin: 9.2 g/dL — ABNORMAL LOW (ref 12.0–15.0)
Hemoglobin: 8.8 g/dL — ABNORMAL LOW (ref 12.0–15.0)
Hemoglobin: 8.5 g/dL — ABNORMAL LOW (ref 12.0–15.0)
MCH: 33.1 pg (ref 26.0–34.0)
MCH: 32.5 pg (ref 26.0–34.0)
MCH: 31.2 pg (ref 26.0–34.0)
MCH: 32.9 pg (ref 26.0–34.0)
MCHC: 34.9 g/dL (ref 30.0–36.0)
MCHC: 33.5 g/dL (ref 30.0–36.0)
MCHC: 33.2 g/dL (ref 30.0–36.0)
MCHC: 35 g/dL (ref 30.0–36.0)
MCV: 95 fL (ref 80.0–100.0)
MCV: 97.2 fL (ref 80.0–100.0)
MCV: 94 fL (ref 80.0–100.0)
MCV: 94.2 fL (ref 80.0–100.0)
Platelets: 195 10*3/uL (ref 150–400)
Platelets: 161 10*3/uL (ref 150–400)
Platelets: 182 10*3/uL (ref 150–400)
Platelets: 164 10*3/uL (ref 150–400)
RBC: 3.17 MIL/uL — ABNORMAL LOW (ref 3.87–5.11)
RBC: 2.83 MIL/uL — ABNORMAL LOW (ref 3.87–5.11)
RBC: 2.82 MIL/uL — ABNORMAL LOW (ref 3.87–5.11)
RBC: 2.58 MIL/uL — ABNORMAL LOW (ref 3.87–5.11)
RDW: 12.5 % (ref 11.5–15.5)
RDW: 12.6 % (ref 11.5–15.5)
RDW: 12.4 % (ref 11.5–15.5)
RDW: 12.6 % (ref 11.5–15.5)
WBC: 9.2 10*3/uL (ref 4.0–10.5)
WBC: 7.2 10*3/uL (ref 4.0–10.5)
WBC: 5.2 10*3/uL (ref 4.0–10.5)
WBC: 4.7 10*3/uL (ref 4.0–10.5)
nRBC: 0 % (ref 0.0–0.2)
nRBC: 0 % (ref 0.0–0.2)
nRBC: 0 % (ref 0.0–0.2)
nRBC: 0 % (ref 0.0–0.2)

## 2018-10-15 LAB — LACTIC ACID, PLASMA: LACTIC ACID, VENOUS: 1.6 mmol/L (ref 0.5–1.9)

## 2018-10-15 LAB — GLUCOSE, CAPILLARY
Glucose-Capillary: 106 mg/dL — ABNORMAL HIGH (ref 70–99)
Glucose-Capillary: 73 mg/dL (ref 70–99)
Glucose-Capillary: 106 mg/dL — ABNORMAL HIGH (ref 70–99)
Glucose-Capillary: 217 mg/dL — ABNORMAL HIGH (ref 70–99)
Glucose-Capillary: 50 mg/dL — ABNORMAL LOW (ref 70–99)
Glucose-Capillary: 115 mg/dL — ABNORMAL HIGH (ref 70–99)

## 2018-10-15 LAB — PROTIME-INR
INR: 1.2 (ref 0.8–1.2)
Prothrombin Time: 14.6 seconds (ref 11.4–15.2)

## 2018-10-15 LAB — COMPREHENSIVE METABOLIC PANEL
ALT: 10 U/L (ref 0–44)
AST: 14 U/L — ABNORMAL LOW (ref 15–41)
Albumin: 3.3 g/dL — ABNORMAL LOW (ref 3.5–5.0)
Alkaline Phosphatase: 58 U/L (ref 38–126)
Anion gap: 7 (ref 5–15)
BUN: 17 mg/dL (ref 8–23)
CO2: 25 mmol/L (ref 22–32)
Calcium: 8.8 mg/dL — ABNORMAL LOW (ref 8.9–10.3)
Chloride: 104 mmol/L (ref 98–111)
Creatinine, Ser: 0.79 mg/dL (ref 0.44–1.00)
GFR calc Af Amer: >60 mL/min (ref >60)
GFR calc non Af Amer: >60 mL/min (ref >60)
Glucose, Bld: 185 mg/dL — ABNORMAL HIGH (ref 70–99)
POTASSIUM: 4.7 mmol/L (ref 3.5–5.1)
Sodium: 136 mmol/L (ref 135–145)
Total Bilirubin: 0.5 mg/dL (ref 0.3–1.2)
Total Protein: 5.7 g/dL — ABNORMAL LOW (ref 6.5–8.1)

## 2018-10-15 LAB — APTT: aPTT: 28 seconds (ref 24–36)

## 2018-10-15 LAB — BASIC METABOLIC PANEL
Anion gap: 5 (ref 5–15)
BUN: 15 mg/dL (ref 8–23)
CO2: 21 mmol/L — AB (ref 22–32)
Calcium: 8.3 mg/dL — ABNORMAL LOW (ref 8.9–10.3)
Chloride: 110 mmol/L (ref 98–111)
Creatinine, Ser: 0.72 mg/dL (ref 0.44–1.00)
GFR calc Af Amer: >60 mL/min (ref >60)
GFR calc non Af Amer: >60 mL/min (ref >60)
Glucose, Bld: 164 mg/dL — ABNORMAL HIGH (ref 70–99)
Potassium: 5.3 mmol/L — ABNORMAL HIGH (ref 3.5–5.1)
Sodium: 136 mmol/L (ref 135–145)

## 2018-10-15 LAB — ABO/RH: ABO/RH(D): A POS

## 2018-10-15 LAB — TYPE AND SCREEN
ABO/RH(D): A POS
Antibody Screen: NEGATIVE

## 2018-10-15 LAB — POC OCCULT BLOOD, ED: Fecal Occult Bld: POSITIVE — AB

## 2018-10-15 MED ORDER — HYOSCYAMINE SULFATE 0.125 MG SL SUBL
0.1250 mg | SUBLINGUAL_TABLET | SUBLINGUAL | Status: DC | PRN
  Filled 2018-10-15: qty 1

## 2018-10-15 MED ORDER — SIMETHICONE 80 MG PO CHEW: 80.0000 mg | CHEWABLE_TABLET | Freq: Four times a day (QID) | ORAL | Status: DC | PRN

## 2018-10-15 MED ORDER — PANTOPRAZOLE SODIUM 40 MG IV SOLR
40.0000 mg | Freq: Two times a day (BID) | INTRAVENOUS | Status: DC
  Administered 2018-10-15 – 2018-10-16 (×5): 40 mg via INTRAVENOUS
  Filled 2018-10-15 (×5): qty 40

## 2018-10-15 MED ORDER — SIMVASTATIN 20 MG PO TABS
20.0000 mg | ORAL_TABLET | Freq: Every day | ORAL | Status: DC
  Administered 2018-10-15 – 2018-10-16 (×2): 20 mg via ORAL
  Filled 2018-10-15 (×2): qty 1

## 2018-10-15 MED ORDER — ONDANSETRON HCL 4 MG PO TABS: 4.0000 mg | ORAL_TABLET | Freq: Four times a day (QID) | ORAL | Status: DC | PRN

## 2018-10-15 MED ORDER — ALBUTEROL SULFATE (2.5 MG/3ML) 0.083% IN NEBU: 2.5000 mg | INHALATION_SOLUTION | RESPIRATORY_TRACT | Status: DC | PRN

## 2018-10-15 MED ORDER — HYDRALAZINE HCL 20 MG/ML IJ SOLN: 5.0000 mg | INTRAMUSCULAR | Status: DC | PRN

## 2018-10-15 MED ORDER — ACETAMINOPHEN 650 MG RE SUPP: 650.0000 mg | Freq: Four times a day (QID) | RECTAL | Status: DC | PRN

## 2018-10-15 MED ORDER — LEVOTHYROXINE SODIUM 25 MCG PO TABS
25.0000 ug | ORAL_TABLET | Freq: Every day | ORAL | Status: DC
  Administered 2018-10-15 – 2018-10-17 (×3): 25 ug via ORAL
  Filled 2018-10-15 (×3): qty 1

## 2018-10-15 MED ORDER — CALCIUM 500 MG PO TABS: 500.0000 mg | ORAL_TABLET | Freq: Every day | ORAL | Status: DC

## 2018-10-15 MED ORDER — LOSARTAN POTASSIUM 50 MG PO TABS: 100.0000 mg | ORAL_TABLET | Freq: Every day | ORAL | Status: DC

## 2018-10-15 MED ORDER — SODIUM CHLORIDE 0.9 % IV SOLN
Freq: Once | INTRAVENOUS | Status: AC
  Administered 2018-10-15: 02:00:00 via INTRAVENOUS

## 2018-10-15 MED ORDER — INSULIN ASPART 100 UNIT/ML ~~LOC~~ SOLN
0.0000 [IU] | Freq: Three times a day (TID) | SUBCUTANEOUS | Status: DC
  Administered 2018-10-15: 3 [IU] via SUBCUTANEOUS

## 2018-10-15 MED ORDER — CALCIUM CARBONATE 1250 (500 CA) MG PO TABS
1.0000 | ORAL_TABLET | Freq: Every day | ORAL | Status: DC
  Administered 2018-10-15 – 2018-10-16 (×2): 500 mg via ORAL
  Filled 2018-10-15 (×2): qty 1

## 2018-10-15 MED ORDER — SODIUM CHLORIDE 0.9 % IV SOLN
INTRAVENOUS | Status: DC
  Administered 2018-10-15: 08:00:00 via INTRAVENOUS
  Administered 2018-10-15: 100 mL via INTRAVENOUS
  Administered 2018-10-16 (×2): via INTRAVENOUS

## 2018-10-15 MED ORDER — MESALAMINE 4 G RE ENEM
4.0000 g | ENEMA | Freq: Every day | RECTAL | Status: DC
  Filled 2018-10-15 (×4): qty 60

## 2018-10-15 MED ORDER — NEBIVOLOL HCL 10 MG PO TABS
10.0000 mg | ORAL_TABLET | Freq: Every day | ORAL | Status: DC
  Administered 2018-10-17: 10 mg via ORAL
  Filled 2018-10-15 (×3): qty 1

## 2018-10-15 MED ORDER — ACETAMINOPHEN 325 MG PO TABS: 650.0000 mg | ORAL_TABLET | Freq: Four times a day (QID) | ORAL | Status: DC | PRN

## 2018-10-15 MED ORDER — AMLODIPINE BESYLATE 5 MG PO TABS
5.0000 mg | ORAL_TABLET | Freq: Two times a day (BID) | ORAL | Status: DC
  Administered 2018-10-16 – 2018-10-17 (×3): 5 mg via ORAL
  Filled 2018-10-15 (×4): qty 1

## 2018-10-15 MED ORDER — INSULIN GLARGINE 100 UNIT/ML ~~LOC~~ SOLN
9.0000 [IU] | Freq: Every day | SUBCUTANEOUS | Status: DC
  Filled 2018-10-15 (×4): qty 0.09

## 2018-10-15 MED ORDER — ONDANSETRON HCL 4 MG/2ML IJ SOLN
4.0000 mg | Freq: Four times a day (QID) | INTRAMUSCULAR | Status: DC | PRN
  Filled 2018-10-15: qty 2

## 2018-10-15 MED ORDER — RISAQUAD PO CAPS
1.0000 | ORAL_CAPSULE | Freq: Every day | ORAL | Status: DC
  Administered 2018-10-15 – 2018-10-17 (×3): 1 via ORAL
  Filled 2018-10-15 (×3): qty 1

## 2018-10-15 MED ORDER — MESALAMINE ER 0.375 G PO CP24: 1.5000 g | ORAL_CAPSULE | Freq: Every day | ORAL | Status: DC

## 2018-10-15 NOTE — Progress Notes (Signed)
Patient arrived to floor and walked from bed to bed with no assistance. VS stable, patient given ice chips and oriented to room and [employer].

## 2018-10-15 NOTE — ED Triage Notes (Signed)
Patient BIB [facility] for diarrhea with bright red clots tonight x 2. Patient also reports a near syncopal episode after the 2nd episode of diarrhea. Denies chest pain, abdominal pain, SOB, dysuria, or fever.   Not currently on blood thinners.   BP 145/68 HR 74 RR 18  CBG 209

## 2018-10-15 NOTE — Care Management Obs Status (Signed)
MEDICARE OBSERVATION STATUS NOTIFICATION   Patient Details  Name: Kristina [name] MRN: [id number] Date of Birth: 1946-04-10   Medicare Observation Status Notification Given:  Yes    Kingsley Plan, RN 10/15/2018, 2:03 PM

## 2018-10-15 NOTE — Plan of Care (Signed)

## 2018-10-15 NOTE — Progress Notes (Signed)
Pt. C/o feeling "wiped out". Pt. Asked this RN to check her CBG. CBG was 73 at 1041. Margo Aye, DO text paged via amion and secure chated with findings. Awaiting orders.

## 2018-10-15 NOTE — Consult Note (Signed)
Referring Provider:  TH Primary Care Physician:  [facility] Primary Gastroenterologist:  Dr. Matthias Hughs  Reason for Consultation:  rectal bleeding  HPI: Kristina [name] is a 73 y.o. 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  Patient had one episode of bleeding 2 months ago which she described a small amount of blood on the tissue paper.  She was doing fine until last night when she started noticing lower quadrant abdominal cramps followed by bloody bowel movements.  Multiple episodes since last night.  Her abdominal pain is resolved now.  Denied any associated nausea or vomiting.  She has chronic diarrhea but her diarrhea has been better in last few years.  Denies unintentional weight loss.  Denies any fever or chills.  Previous GI work-up  Colonoscopy in December 2019 by Dr. Matthias Hughs showed scattered areas of ulcers in the sigmoid colon.  Normal terminal ileum.  Diverticulosis.  Biopsy from this area showed chronic active colitis.  Random biopsies showed chronic inactive colitis.  She was diagnosed with nonspecific colitis.  She was started on mesalamine anemia.  Was seen in the office in January 2020 and was subsequently started on Apriso.  Past Medical History:  Diagnosis Date  . Anxiety   . Asthma   . Diabetes (HCC)   . Diabetes mellitus without complication (HCC)   . Diverticulitis   . GERD (gastroesophageal reflux disease)   . HTN (hypertension)   . Hypercholesteremia   . Hyperlipidemia   . Hypertension   . Obesity   . Rosacea   . Thyroid disease     Past Surgical History:  Procedure Laterality Date  . ACHILLES TENDON REPAIR    . APPENDECTOMY    . CESAREAN SECTION    . CHOLECYSTECTOMY    . hammertoe      Prior to Admission medications   Medication Sig Start Date End  Date Taking? Authorizing Provider  acetaminophen (TYLENOL) 500 MG tablet Take 1,000 mg by mouth 2 (two) times daily as needed (pain).   Yes [provider]  amLODipine (NORVASC) 10 MG tablet Take 1 tablet (10 mg total) by mouth daily. Patient taking differently: Take 5 mg by mouth 2 (two) times daily.  06/10/18  Yes Kroeger, Ovidio Kin., PA-C  aspirin 81 MG tablet Take 81 mg by mouth daily.   Yes [provider]  BYSTOLIC 10 MG tablet TAKE 1 TABLET (10 MG TOTAL) BY MOUTH DAILY. Patient taking differently: Take 10 mg by mouth daily.  07/15/15  Yes Nyoka Cowden, MD  CALCIUM PO Take 500 mg by mouth at bedtime.   Yes [provider]  hyoscyamine (LEVSIN SL) 0.125 MG SL tablet Place 0.125 mg under the tongue every 4 (four) hours as needed for cramping.    Yes [provider]  insulin glargine (LANTUS) 100 unit/mL SOPN Inject 17 Units into the skin daily.   Yes [provider]  levothyroxine (SYNTHROID, LEVOTHROID) 25 MCG tablet Take 25 mcg by mouth daily before breakfast.   Yes [provider]  losartan (COZAAR) 100 MG tablet Take 100 mg by mouth daily.   Yes [provider]  meloxicam (MOBIC) 15 MG tablet Take 15 mg by mouth daily. 10/06/18  Yes [provider]  mesalamine (APRISO) 0.375 g 24 hr capsule Take 4 capsules by mouth daily.  08/28/18  Yes [provider]  metFORMIN (GLUCOPHAGE-XR) 500 MG 24 hr tablet Take 3 tablets (1,500 mg total) by mouth daily. 04/05/18  Yes Randel Pigg, [name], MD  Probiotic Product (ALIGN PO) Take 1 tablet by mouth daily.   Yes [provider]  simethicone (MYLICON) 125 MG chewable tablet Chew 125 mg by mouth every 6 (six) hours as needed for flatulence.   Yes [provider]  simvastatin (ZOCOR) 20 MG tablet Take 1 tablet by mouth daily. 08/05/13  Yes [provider]  tiZANidine (ZANAFLEX) 4 MG tablet Take 4 mg by mouth 3 (three) times daily as needed for muscle spasms.    Yes [provider]    Scheduled Meds: . acidophilus  1 capsule Oral Daily  . amLODipine  5 mg Oral BID  . calcium carbonate  1 tablet Oral Q supper  . insulin aspart  0-9 Units Subcutaneous TID WC  . insulin glargine  9 Units Subcutaneous Daily  . levothyroxine  25 mcg Oral QAC breakfast  . mesalamine  1.5 g Oral Daily  . nebivolol  10 mg Oral Daily  . pantoprazole  40 mg Intravenous Q12H  . simvastatin  20 mg Oral q1800   Continuous Infusions: . sodium chloride 100 mL/hr at 10/15/18 0820   PRN Meds:.acetaminophen **OR** acetaminophen, albuterol, hydrALAZINE, hyoscyamine, ondansetron **OR** ondansetron (ZOFRAN) IV, simethicone  Allergies as of 10/15/2018 - Review Complete 10/15/2018  Allergen Reaction Noted  . Ace inhibitors Cough 05/31/2015  . Ciprofloxacin Other (See Comments) 05/01/2015  . Doxycycline Other (See Comments) 05/01/2015  . Shrimp [shellfish allergy] Diarrhea, Nausea And Vomiting, and Rash 05/01/2015    Family History  Problem Relation Age of Onset  . Hypertension Mother   . Heart failure Mother   . Hypotension Father   . Dementia Father     Social History   Socioeconomic History  . Marital status: Divorced    Spouse name: Not on file  . Number of children: Not on file  . Years of education: Not on file  . Highest education level: Not on file  Occupational History  . Not on file  Social Needs  . Financial resource strain: Not on file  . Food insecurity:    Worry: Not on file    Inability: Not on file  . Transportation needs:    Medical: Not on file    Non-medical: Not on file  Tobacco Use  . Smoking status: Never Smoker  . Smokeless tobacco: Never Used  Substance and Sexual Activity  . Alcohol use: No    Alcohol/week: 0.0 standard drinks  . Drug use: No  . Sexual activity: Not on file  Lifestyle  . Physical activity:    Days per week: Not on file    Minutes per session: Not on file  . Stress: Not on file  Relationships  .  Social connections:    Talks on phone: Not on file    Gets together: Not on file    Attends religious service: Not on file    Active member of club or organization: Not on file    Attends meetings of clubs or organizations: Not on file    Relationship status: Not on file  . Intimate partner violence:    Fear of current or ex partner: Not on file    Emotionally abused: Not on file    Physically abused: Not on file    Forced sexual activity: Not on file  Other Topics Concern  .  Not on file  Social History Narrative   ** Merged History Encounter **        Review of Systems: Review of Systems  Constitutional: Negative for chills and fever.  HENT: Negative for hearing loss and tinnitus.   Eyes: Negative for blurred vision and double vision.  Respiratory: Negative for cough and hemoptysis.   Cardiovascular: Negative for chest pain and palpitations.  Gastrointestinal: Positive for abdominal pain, blood in stool and diarrhea. Negative for heartburn, nausea and vomiting.  Genitourinary: Negative for dysuria and urgency.  Musculoskeletal: Negative for myalgias and neck pain.  Skin: Negative for itching and rash.  Neurological: Negative for seizures and loss of consciousness.  Endo/Heme/Allergies: Does not bruise/bleed easily.  Psychiatric/Behavioral: Negative for hallucinations. The patient is not nervous/anxious.     Physical Exam: Vital signs: Vitals:   10/15/18 0453 10/15/18 0821  BP: 136/70 (!) 115/57  Pulse: 74   Resp:    Temp: 98.6 F (37 C)   SpO2: 99%    Last BM Date: 10/14/18 Exam performed in presence of NT - Alicia  Physical Exam  Constitutional: She is oriented to person, place, and time. She appears well-developed and well-nourished. No distress.  HENT:  Head: Atraumatic.  Mouth/Throat: Oropharynx is clear and moist. No oropharyngeal exudate.  Eyes: EOM are normal. No scleral icterus.  Neck: Normal range of motion. Neck supple.  Cardiovascular: Normal rate,  regular rhythm and normal heart sounds.  No murmur heard. Pulmonary/Chest: Effort normal and breath sounds normal. No respiratory distress.  Abdominal: Soft. Bowel sounds are normal. She exhibits no distension. There is no abdominal tenderness. There is no rebound and no guarding.  Musculoskeletal: Normal range of motion.        General: No edema.  Neurological: She is alert and oriented to person, place, and time.  Skin: Skin is warm. No erythema.  Psychiatric: She has a normal mood and affect. Judgment and thought content normal.  Vitals reviewed.  GI:  Lab Results: Recent Labs    10/15/18 0136 10/15/18 0429 10/15/18 0942  WBC 9.2 7.2 5.2  HGB 10.5* 9.2* 8.8*  HCT 30.1* 27.5* 26.5*  PLT 195 161 182   BMET Recent Labs    10/15/18 0136 10/15/18 0429  NA 136 136  K 4.7 5.3*  CL 104 110  CO2 25 21*  GLUCOSE 185* 164*  BUN 17 15  CREATININE 0.79 0.72  CALCIUM 8.8* 8.3*   LFT Recent Labs    10/15/18 0136  PROT 5.7*  ALBUMIN 3.3*  AST 14*  ALT 10  ALKPHOS 58  BILITOT 0.5   PT/INR Recent Labs    10/15/18 0136  LABPROT 14.6  INR 1.2     Studies/Results: No results found.   Colonoscopy in December 2019 by Dr. Matthias Hughs showed scattered areas of ulcers in the sigmoid colon.  Normal terminal ileum.  Diverticulosis.  Biopsy from this area showed chronic active colitis.  Random biopsies showed chronic inactive colitis.  She was diagnosed with nonspecific colitis.  She was started on mesalamine anemia.  Was seen in the office in January 2020 and was subsequently started on Apriso.   Impression/Plan: -Rectal bleeding.  Most likely diverticular bleeding. -History of nonspecific colitis.  As mentioned above. -Acute blood loss anemia.  Recommendations ------------------------- -Colonoscopy in December 2019 by Dr. Matthias Hughs  showed patchy ulceration in the sigmoid colon.  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  She had normal terminal ileum.   Findings were not consistent with either  Crohn's disease or ulcerative colitis and hence she was diagnosed with nonspecific colitis and was placed on Apriso.  -This bleeding episode is different from her previous episodes .  Could be from diverticular bleed.  If she continues to have a bleeding, may need to do repeat flexible sigmoidoscopy to rule out worsening of underlying colitis.  -Okay to have clear liquid diet today.  Keep n.p.o. past midnight.  -Continue Apriso for now.  Start Rowasa enema.  -Monitor H&H.  GI will follow  Records from [facility] reviewed and summarized.     LOS: 0 days   Kathi Der  MD, FACP 10/15/2018, 10:59 AM  Contact #  [phone]

## 2018-10-15 NOTE — Progress Notes (Signed)
Hypoglycemic Event  CBG: 50 @ 2100  Treatment: 8 ounces of apple juice  Symptoms: None  Follow-up CBG: Time: 2200 CBG Result: 115   Kristina [name] C Devynn Hessler

## 2018-10-15 NOTE — H&P (Signed)
History and Physical    Kristina [name]:[id number] DOB: [birth year] DOA: 10/15/2018  Referring MD/NP/PA:   PCP: [facility]   Patient coming from:  The patient is coming from home.  At baseline, pt is independent for most of ADL.        Chief Complaint: Bloody diarrhea  HPI: Kristina [name] is a 73 y.o. female with medical history significant of colitis, hypertension, hyperlipidemia, diabetes mellitus, asthma, GERD, hypothyroidism, who presents with bloody diarrhea.  Patient states that she has had 2 episode of bloody diarrhea since 11:00 PM.  The first episode was associated with abdominal cramping.  The blood was bright red in color with blood clot in stool.  Patient has dizzy and fatigue.  She denies chest pain, shortness of breath.  Currently patient does not have nausea, vomiting, diarrhea, abdominal pain, or abdominal cramping.  No fever or chills.  Denies symptoms of UTI or unilateral weakness.  Patient states that she had colonoscopy 6 months ago by Dr. Matthias Hughs, but I cannot find report in epic.  ED Course: pt was found to have hemoglobin dropped from 12.9 on 04/04/18-10.5.  FOBT positive. WBC 9.2, lactic acid 1.6, Electrolytes renal function okay.  Temperature normal, no tachycardia, no tachypnea, oxygen saturation 99% on room air.  Patient is placed on MedSurg bed for observation.  Review of Systems:   General: no fevers, chills, no body weight gain, has fatigue HEENT: no blurry vision, hearing changes or sore throat Respiratory: no dyspnea, coughing, wheezing CV: no chest pain, no palpitations GI: no nausea, vomiting, had abdominal cramping and bloody diarrhea.   GU: no dysuria, burning on urination, increased urinary frequency, hematuria  Ext: Has trace leg edema Neuro: no unilateral weakness, numbness, or tingling, no vision change or hearing loss Skin: no rash, no skin tear. MSK: No muscle spasm, no deformity, no limitation of range of movement  in spin Heme: No easy bruising.  Travel history: No recent long distant travel.  Allergy:  Allergies  Allergen Reactions  . Ace Inhibitors Cough  . Ciprofloxacin Other (See Comments)    Tendinitis in heel and posterior right LE  . Doxycycline Other (See Comments)    Pt does not remember what the reaction was - many years ago  . Shrimp [Shellfish Allergy] Diarrhea, Nausea And Vomiting and Rash    Past Medical History:  Diagnosis Date  . Anxiety   . Asthma   . Diabetes (HCC)   . Diabetes mellitus without complication (HCC)   . Diverticulitis   . GERD (gastroesophageal reflux disease)   . HTN (hypertension)   . Hypercholesteremia   . Hyperlipidemia   . Hypertension   . Obesity   . Rosacea   . Thyroid disease     Past Surgical History:  Procedure Laterality Date  . ACHILLES TENDON REPAIR    . APPENDECTOMY    . CESAREAN SECTION    . CHOLECYSTECTOMY    . hammertoe      Social History:  reports that she has never smoked. She has never used smokeless tobacco. She reports that she does not drink alcohol or use drugs.  Family History:  Family History  Problem Relation Age of Onset  . Hypertension Mother   . Heart failure Mother   . Hypotension Father   . Dementia Father      Prior to Admission medications   Medication Sig Start Date End Date Taking? Authorizing Provider  acetaminophen (TYLENOL) 500 MG tablet Take 1,000  mg by mouth 2 (two) times daily as needed (pain).   Yes [provider]  amLODipine (NORVASC) 10 MG tablet Take 1 tablet (10 mg total) by mouth daily. Patient taking differently: Take 5 mg by mouth 2 (two) times daily.  06/10/18  Yes Kroeger, Ovidio Kin., PA-C  aspirin 81 MG tablet Take 81 mg by mouth daily.   Yes [provider]  BYSTOLIC 10 MG tablet TAKE 1 TABLET (10 MG TOTAL) BY MOUTH DAILY. Patient taking differently: Take 10 mg by mouth daily.  07/15/15  Yes Nyoka Cowden, MD  CALCIUM PO Take 500 mg by mouth at bedtime.   Yes  [provider]  hyoscyamine (LEVSIN SL) 0.125 MG SL tablet Place 0.125 mg under the tongue every 4 (four) hours as needed for cramping.    Yes [provider]  insulin glargine (LANTUS) 100 unit/mL SOPN Inject 17 Units into the skin daily.   Yes [provider]  levothyroxine (SYNTHROID, LEVOTHROID) 25 MCG tablet Take 25 mcg by mouth daily before breakfast.   Yes [provider]  losartan (COZAAR) 100 MG tablet Take 100 mg by mouth daily.   Yes [provider]  meloxicam (MOBIC) 15 MG tablet Take 15 mg by mouth daily. 10/06/18  Yes [provider]  mesalamine (APRISO) 0.375 g 24 hr capsule Take 4 capsules by mouth daily. 08/28/18  Yes [provider]  metFORMIN (GLUCOPHAGE-XR) 500 MG 24 hr tablet Take 3 tablets (1,500 mg total) by mouth daily. 04/05/18  Yes Randel Pigg, [name], MD  Probiotic Product (ALIGN PO) Take 1 tablet by mouth daily.   Yes [provider]  simethicone (MYLICON) 125 MG chewable tablet Chew 125 mg by mouth every 6 (six) hours as needed for flatulence.   Yes [provider]  simvastatin (ZOCOR) 20 MG tablet Take 1 tablet by mouth daily. 08/05/13  Yes [provider]  tiZANidine (ZANAFLEX) 4 MG tablet Take 4 mg by mouth 3 (three) times daily as needed for muscle spasms.   Yes [provider]    Physical Exam: Vitals:   10/15/18 0330 10/15/18 0400 10/15/18 0430 10/15/18 0453  BP: (!) 106/53 (!) 104/59 114/64 136/70  Pulse: 70 69 72 74  Resp: 15 17 15    Temp:    98.6 F (37 C)  TempSrc:    Oral  SpO2: 96% 99% 98% 99%  Weight:      Height:       General: Not in acute distress HEENT:       Eyes: PERRL, EOMI, no scleral icterus.       ENT: No discharge from the ears and nose, no pharynx injection, no tonsillar enlargement.        Neck: No JVD, no bruit, no mass felt. Heme: No neck lymph node enlargement. Cardiac: S1/S2, RRR, No murmurs, No gallops or rubs. Respiratory: No  rales, wheezing, rhonchi or rubs. GI: Soft, nondistended, nontender, no rebound pain, no organomegaly, BS present. GU: No hematuria Ext: has trace leg edema bilaterally. 2+DP/PT pulse bilaterally. Musculoskeletal: No joint deformities, No joint redness or warmth, no limitation of ROM in spin. Skin: No rashes.  Neuro: Alert, oriented X3, cranial nerves II-XII grossly intact, moves all extremities normally.  Psych: Patient is not psychotic, no suicidal or hemocidal ideation.  Labs on Admission: I have personally reviewed following labs and imaging studies  CBC: Recent Labs  Lab 10/15/18 0136 10/15/18 0429  WBC 9.2 7.2  HGB 10.5* 9.2*  HCT 30.1*  27.5*  MCV 95.0 97.2  PLT 195 161   Basic Metabolic Panel: Recent Labs  Lab 10/15/18 0136 10/15/18 0429  NA 136 136  K 4.7 5.3*  CL 104 110  CO2 25 21*  GLUCOSE 185* 164*  BUN 17 15  CREATININE 0.79 0.72  CALCIUM 8.8* 8.3*   GFR: Estimated Creatinine Clearance: 63 mL/min (by C-G formula based on SCr of 0.72 mg/dL). Liver Function Tests: Recent Labs  Lab 10/15/18 0136  AST 14*  ALT 10  ALKPHOS 58  BILITOT 0.5  PROT 5.7*  ALBUMIN 3.3*   No results for input(s): LIPASE, AMYLASE in the last 168 hours. No results for input(s): AMMONIA in the last 168 hours. Coagulation Profile: Recent Labs  Lab 10/15/18 0136  INR 1.2   Cardiac Enzymes: No results for input(s): CKTOTAL, CKMB, CKMBINDEX, TROPONINI in the last 168 hours. BNP (last 3 results) No results for input(s): PROBNP in the last 8760 hours. HbA1C: No results for input(s): HGBA1C in the last 72 hours. CBG: No results for input(s): GLUCAP in the last 168 hours. Lipid Profile: No results for input(s): CHOL, HDL, LDLCALC, TRIG, CHOLHDL, LDLDIRECT in the last 72 hours. Thyroid Function Tests: No results for input(s): TSH, T4TOTAL, FREET4, T3FREE, THYROIDAB in the last 72 hours. Anemia Panel: No results for input(s): VITAMINB12, FOLATE, FERRITIN, TIBC, IRON,  RETICCTPCT in the last 72 hours. Urine analysis:    Component Value Date/Time   COLORURINE STRAW (A) 03/21/2018 1228   APPEARANCEUR HAZY (A) 03/21/2018 1228   LABSPEC 1.006 03/21/2018 1228   PHURINE 6.0 03/21/2018 1228   GLUCOSEU NEGATIVE 03/21/2018 1228   HGBUR SMALL (A) 03/21/2018 1228   BILIRUBINUR NEGATIVE 03/21/2018 1228   KETONESUR NEGATIVE 03/21/2018 1228   PROTEINUR NEGATIVE 03/21/2018 1228   UROBILINOGEN 0.2 05/01/2015 1551   NITRITE NEGATIVE 03/21/2018 1228   LEUKOCYTESUR LARGE (A) 03/21/2018 1228   Sepsis Labs: (procalcitonin:4,lacticidven:4) )No results found for this or any previous visit (from the past 240 hour(s)).   Radiological Exams on Admission: No results found.   EKG: Independently reviewed.  Sinus rhythm, QTC 451, low voltage, nonspecific T wave change.  Assessment/Plan Principal Problem:   GIB (gastrointestinal bleeding) Active Problems:   Essential hypertension   Diabetes mellitus type 2, controlled (HCC)   Hypothyroidism   HLD (hyperlipidemia)   Colitis   GIB (gastrointestinal bleeding): Hgb dropped from 12.9-->10.5.  Currently hemodynamically stable.  Seems to be due to diverticular bleeding, but the patient is taking aspirin and Mobic, making upper GI bleeding also possible. Pt is followed by Dr. Matthias Hughs of GI   - will place in med-surg bed for obs - Hold aspirin and Mobic - NPO - IVF: 100 mL/hr of NS - Start IV pantoprazole 40 mg bid - Zofran IV for nausea - Avoid NSAIDs and SQ heparin - Maintain IV access (2 large bore IVs if possible). - Monitor closely and follow q6h cbc, transfuse as necessary, if Hgb<7.0 - LaB: INR, PTT and type screen - please call GI in AM  HTN:  -Continue home medications: Amlodipine, Bystolic -Hold Cozaar in case patient develop worsening GI bleeding, and risk of hypotension -IV hydralazine prn  Diabetes mellitus type 2, controlled (HCC): Last A1c 5.7 on 03/23/18, well controled. Patient is taking  Lantus and metformin at home -will decrease Lantus dose from 17-->9 units daily  -SSI  Hypothyroidism: -Synthroid  HLD (hyperlipidemia): -Zocor  Hx of Colitis: Patient is a follow-up with Dr. Matthias Hughs. -Continue mesalamine, align    DVT ppx:  SCD Code Status: Full code Family Communication: None at bed side.    Disposition Plan:  Anticipate discharge back to previous home environment Consults called:  none Admission status: Obs / tele    Date of Service 10/15/2018    Lorretta Harp [facility]   If 7PM-7AM, please contact night-coverage [email or web address] Password [facility] 10/15/2018, 5:28 AM

## 2018-10-15 NOTE — Progress Notes (Signed)
Kristina [name] is a 73 y.o. female with medical history significant of colitis, hypertension, hyperlipidemia, diabetes mellitus, asthma, GERD, hypothyroidism, who presents with bloody diarrhea.  Patient states that she has had 2 episode of bloody diarrhea since 11:00 PM.  The first episode was associated with abdominal cramping.  The blood was bright red in color with blood clot in stool.  Patient has dizzy and fatigue.  She denies chest pain, shortness of breath.  Currently patient does not have nausea, vomiting, diarrhea, abdominal pain, or abdominal cramping.  No fever or chills.  Denies symptoms of UTI or unilateral weakness.  Patient states that she had colonoscopy 6 months ago by Dr. Matthias Hughs, but I cannot find report in epic.  Admitted for presumed lower GI bleed.  Presented with hemoglobin drop from 12.9 at baseline to 10.5.  This morning repeated hemoglobin 9.2.  10/15/18: Patient seen and examined at her bedside.  She denies chest pain or dizziness.  Admits to recent rectal bleeding x2 in the ED early this morning.  GI [name] has been consulted.  Highly appreciated.  Please refer to H&P dictated by Dr. Clyde Lundborg on 10/15/2018 for further details of the assessment and plan.

## 2018-10-15 NOTE — ED Provider Notes (Signed)
[facility] EMERGENCY DEPARTMENT Provider Note   CSN: [id number] Arrival date & time: 10/15/18  0118    History   Chief Complaint Chief Complaint  Patient presents with  . Diarrhea    HPI Kristina [name] is a 73 y.o. female.     HPI   73 yo F with PMHx as below including HTN, HLD, IBS, "colitis" followed by Dr. Matthias Hughs here w/ rectal bleeding. Pt was in usual state of health until this afternoon. She felt a mild lower abd cramp and went to the BR. In the BR, she had two loose, grossly bloody BM with large clots. She felt like she was going to pass out at the time. Called 911. On EMS arrival, pt pale and diaphoretic. Sx now improved. No h/o recent diarrhea. No fever or chills. No h/o GIB per her report. No nausea, indigestion. Of note, pt did start taking meloxicam recently. No other blood thinners. No pain now.  Past Medical History:  Diagnosis Date  . Anxiety   . Asthma   . Diabetes (HCC)   . Diabetes mellitus without complication (HCC)   . Diverticulitis   . GERD (gastroesophageal reflux disease)   . HTN (hypertension)   . Hypercholesteremia   . Hyperlipidemia   . Hypertension   . Obesity   . Rosacea   . Thyroid disease     Patient Active Problem List   Diagnosis Date Noted  . Hypokalemia   . Hypothyroidism   . Acute pyelonephritis 03/22/2018  . Pyelonephritis   . UTI (urinary tract infection) 03/21/2018  . IBS (irritable bowel syndrome) 03/21/2018  . Morbid obesity (HCC) 08/08/2015  . Acute hyponatremia 05/01/2015  . Gastroenteritis, acute 05/01/2015  . Diabetes mellitus type 2, controlled (HCC) 05/01/2015  . Asthma in remission 05/01/2015  . Diarrhea   . Cough variant asthma 04/19/2015  . Acute bronchitis 03/08/2015  . Hypothyroidism, adult 02/19/2015  . Dyspnea 02/18/2015  . Upper airway cough syndrome 01/07/2015  . Chest pain 09/15/2014  . Pre-syncope 12/08/2013  . PVC (premature ventricular contraction) 08/26/2013  . Rosacea    . Anxiety   . Diverticulitis   . Diabetes (HCC)   . Essential hypertension   . Obesity     Past Surgical History:  Procedure Laterality Date  . ACHILLES TENDON REPAIR    . APPENDECTOMY    . CESAREAN SECTION    . CHOLECYSTECTOMY    . hammertoe       OB History    Gravida  0   Para  0   Term  0   Preterm  0   AB  0   Living        SAB  0   TAB  0   Ectopic  0   Multiple      Live Births               Home Medications    Prior to Admission medications   Medication Sig Start Date End Date Taking? Authorizing Provider  acetaminophen (TYLENOL) 500 MG tablet Take 1,000 mg by mouth 2 (two) times daily as needed (pain).    [provider]  amLODipine (NORVASC) 10 MG tablet Take 1 tablet (10 mg total) by mouth daily. 06/10/18   Kroeger, Ovidio Kin., PA-C  aspirin 81 MG tablet Take 81 mg by mouth daily.    [provider]  BYSTOLIC 10 MG tablet TAKE 1 TABLET (10 MG TOTAL) BY MOUTH DAILY. Patient taking differently:  Take 10 mg by mouth daily.  07/15/15   Nyoka Cowden, MD  CALCIUM PO Take 500 mg by mouth at bedtime.    [provider]  hyoscyamine (LEVSIN SL) 0.125 MG SL tablet Place 0.125 mg under the tongue every 4 (four) hours as needed for cramping.     [provider]  levothyroxine (SYNTHROID, LEVOTHROID) 25 MCG tablet Take 25 mcg by mouth daily before breakfast.    [provider]  losartan (COZAAR) 100 MG tablet Take 100 mg by mouth daily.    [provider]  metFORMIN (GLUCOPHAGE-XR) 500 MG 24 hr tablet Take 3 tablets (1,500 mg total) by mouth daily. 04/05/18   Lenox Ponds, MD  Probiotic Product (ALIGN PO) Take 1 tablet by mouth daily.    [provider]  simvastatin (ZOCOR) 20 MG tablet Take 1 tablet by mouth daily. 08/05/13   [provider]  tiZANidine (ZANAFLEX) 4 MG tablet Take 4 mg by mouth 3 (three) times daily as needed for muscle spasms.    [provider]     Family History Family History  Problem Relation Age of Onset  . Hypertension Mother   . Heart failure Mother   . Hypotension Father   . Dementia Father     Social History Social History   Tobacco Use  . Smoking status: Never Smoker  . Smokeless tobacco: Never Used  Substance Use Topics  . Alcohol use: No    Alcohol/week: 0.0 standard drinks  . Drug use: No     Allergies   Ace inhibitors; Ciprofloxacin; Doxycycline; and Shrimp [shellfish allergy]   Review of Systems Review of Systems  Constitutional: Positive for diaphoresis and fatigue. Negative for chills and fever.  HENT: Negative for congestion and rhinorrhea.   Eyes: Negative for visual disturbance.  Respiratory: Negative for cough, shortness of breath and wheezing.   Cardiovascular: Negative for chest pain and leg swelling.  Gastrointestinal: Positive for blood in stool. Negative for abdominal pain, diarrhea, nausea and vomiting.  Genitourinary: Negative for dysuria and flank pain.  Musculoskeletal: Negative for neck pain and neck stiffness.  Skin: Negative for rash and wound.  Allergic/Immunologic: Negative for immunocompromised state.  Neurological: Negative for syncope, weakness and headaches.  All other systems reviewed and are negative.    Physical Exam Updated Vital Signs BP (!) 150/68 (BP Location: Right Arm)   Pulse 79   Temp 98.4 F (36.9 C) (Oral)   Resp 11   Ht 5\' 4"  (1.626 m)   Wt 77.1 kg   SpO2 99%   BMI 29.18 kg/m   Physical Exam Vitals signs and nursing note reviewed.  Constitutional:      General: She is not in acute distress.    Appearance: She is well-developed.  HENT:     Head: Normocephalic and atraumatic.  Eyes:     Conjunctiva/sclera: Conjunctivae normal.  Neck:     Musculoskeletal: Neck supple.  Cardiovascular:     Rate and Rhythm: Normal rate and regular rhythm.     Heart sounds: Normal heart sounds. No murmur. No friction rub.  Pulmonary:     Effort: Pulmonary  effort is normal. No respiratory distress.     Breath sounds: Normal breath sounds. No wheezing or rales.  Abdominal:     General: Bowel sounds are normal. There is no distension.     Palpations: Abdomen is soft.     Tenderness: There is no abdominal tenderness.     Comments: Soft, non-tender.  Skin:    General: Skin is warm.     Capillary Refill: Capillary refill takes less than 2 seconds.  Neurological:     Mental Status: She is alert and oriented to person, place, and time.     Motor: No abnormal muscle tone.      ED Treatments / Results  Labs (all labs ordered are listed, but only abnormal results are displayed) Labs Reviewed  CBC - Abnormal; Notable for the following components:      Result Value   RBC 3.17 (*)    Hemoglobin 10.5 (*)    HCT 30.1 (*)    All other components within normal limits  COMPREHENSIVE METABOLIC PANEL - Abnormal; Notable for the following components:   Glucose, Bld 185 (*)    Calcium 8.8 (*)    Total Protein 5.7 (*)    Albumin 3.3 (*)    AST 14 (*)    All other components within normal limits  POC OCCULT BLOOD, ED - Abnormal; Notable for the following components:   Fecal Occult Bld POSITIVE (*)    All other components within normal limits  PROTIME-INR  LACTIC ACID, PLASMA  LACTIC ACID, PLASMA  OCCULT BLOOD X 1 CARD TO LAB, STOOL  TYPE AND SCREEN    EKG EKG Interpretation  Date/Time:  Wednesday October 15 2018 01:26:25 EDT Ventricular Rate:  80 PR Interval:    QRS Duration: 100 QT Interval:  391 QTC Calculation: 451 R Axis:   72 Text Interpretation:  Sinus rhythm Abnormal R-wave progression, early transition Baseline wander in lead(s) V5 No significant change since last tracing Confirmed by Shaune Pollack [phone]) on 10/15/2018 1:29:09 AM   Radiology No results found.  Procedures Procedures (including critical care time)  Medications Ordered in ED Medications  0.9 %  sodium chloride infusion ( Intravenous New Bag/Given 10/15/18  0155)     Initial Impression / Assessment and Plan / ED Course  I have reviewed the triage vital signs and the nursing notes.  Pertinent labs & imaging results that were available during my care of the patient were reviewed by me and considered in my medical decision making (see chart for details).  Clinical Course as of Oct 14 217  Wed Oct 15, 2018  0212 73 yo F with h/o IBS, "colitis," here with BRBPR. Suspect diverticular bleed, though question of underlying UC given that she is on mesalamine. Will send T&S, admit for IG c/s in AM. She is HDS now, no signs of brisk bleed, Hgb slightly lower than baseline. LA normal, minimal pain, doubt ischemia.   [CI]  0218 LFTs normal, hemoccult positive. BUN normal, doubt UGIB clinically or historically. Will admit.   [CI]    Clinical Course User Index [CI] Shaune Pollack, MD         Final Clinical Impressions(s) / ED Diagnoses   Final diagnoses:  Lower GI bleed    ED Discharge Orders    None       Shaune Pollack, MD 10/15/18 [phone]

## 2018-10-16 ENCOUNTER — Encounter (HOSPITAL_COMMUNITY)
Admission: EM | Disposition: A | Discharge status: Home / Self Care | Payer: Self-pay | Source: Home / Self Care | Attending: Internal Medicine

## 2018-10-16 ENCOUNTER — Ambulatory Visit: Payer: Medicare Other | Admitting: Cardiovascular Disease

## 2018-10-16 ENCOUNTER — Encounter (HOSPITAL_COMMUNITY): Payer: Self-pay | Admitting: Gastroenterology

## 2018-10-16 DIAGNOSIS — Z91013 Allergy to seafood: Secondary | ICD-10-CM | POA: Diagnosis not present

## 2018-10-16 DIAGNOSIS — E11649 Type 2 diabetes mellitus with hypoglycemia without coma: Secondary | ICD-10-CM | POA: Diagnosis present

## 2018-10-16 DIAGNOSIS — Z7982 Long term (current) use of aspirin: Secondary | ICD-10-CM | POA: Diagnosis not present

## 2018-10-16 DIAGNOSIS — Z882 Allergy status to sulfonamides status: Secondary | ICD-10-CM | POA: Diagnosis not present

## 2018-10-16 DIAGNOSIS — D62 Acute posthemorrhagic anemia: Secondary | ICD-10-CM | POA: Diagnosis present

## 2018-10-16 DIAGNOSIS — Z881 Allergy status to other antibiotic agents status: Secondary | ICD-10-CM | POA: Diagnosis not present

## 2018-10-16 DIAGNOSIS — Z8249 Family history of ischemic heart disease and other diseases of the circulatory system: Secondary | ICD-10-CM | POA: Diagnosis not present

## 2018-10-16 DIAGNOSIS — K529 Noninfective gastroenteritis and colitis, unspecified: Secondary | ICD-10-CM | POA: Diagnosis present

## 2018-10-16 DIAGNOSIS — I10 Essential (primary) hypertension: Secondary | ICD-10-CM | POA: Diagnosis present

## 2018-10-16 DIAGNOSIS — K922 Gastrointestinal hemorrhage, unspecified: Secondary | ICD-10-CM | POA: Diagnosis present

## 2018-10-16 DIAGNOSIS — K5791 Diverticulosis of intestine, part unspecified, without perforation or abscess with bleeding: Secondary | ICD-10-CM | POA: Diagnosis present

## 2018-10-16 DIAGNOSIS — E875 Hyperkalemia: Secondary | ICD-10-CM | POA: Diagnosis present

## 2018-10-16 DIAGNOSIS — Z7984 Long term (current) use of oral hypoglycemic drugs: Secondary | ICD-10-CM | POA: Diagnosis not present

## 2018-10-16 DIAGNOSIS — J45909 Unspecified asthma, uncomplicated: Secondary | ICD-10-CM | POA: Diagnosis present

## 2018-10-16 DIAGNOSIS — E7800 Pure hypercholesterolemia, unspecified: Secondary | ICD-10-CM | POA: Diagnosis present

## 2018-10-16 DIAGNOSIS — E039 Hypothyroidism, unspecified: Secondary | ICD-10-CM | POA: Diagnosis present

## 2018-10-16 DIAGNOSIS — Z888 Allergy status to other drugs, medicaments and biological substances status: Secondary | ICD-10-CM | POA: Diagnosis not present

## 2018-10-16 DIAGNOSIS — K219 Gastro-esophageal reflux disease without esophagitis: Secondary | ICD-10-CM | POA: Diagnosis present

## 2018-10-16 DIAGNOSIS — Z79890 Hormone replacement therapy (postmenopausal): Secondary | ICD-10-CM | POA: Diagnosis not present

## 2018-10-16 DIAGNOSIS — K648 Other hemorrhoids: Secondary | ICD-10-CM | POA: Diagnosis present

## 2018-10-16 DIAGNOSIS — L719 Rosacea, unspecified: Secondary | ICD-10-CM | POA: Diagnosis present

## 2018-10-16 DIAGNOSIS — E785 Hyperlipidemia, unspecified: Secondary | ICD-10-CM | POA: Diagnosis present

## 2018-10-16 HISTORY — PX: BIOPSY: SHX5522

## 2018-10-16 HISTORY — PX: FLEXIBLE SIGMOIDOSCOPY: SHX5431

## 2018-10-16 LAB — CBC
HCT: 23.3 % — ABNORMAL LOW (ref 36.0–46.0)
Hemoglobin: 7.9 g/dL — ABNORMAL LOW (ref 12.0–15.0)
MCH: 32.1 pg (ref 26.0–34.0)
MCHC: 33.9 g/dL (ref 30.0–36.0)
MCV: 94.7 fL (ref 80.0–100.0)
Platelets: 149 10*3/uL — ABNORMAL LOW (ref 150–400)
RBC: 2.46 MIL/uL — ABNORMAL LOW (ref 3.87–5.11)
RDW: 12.7 % (ref 11.5–15.5)
WBC: 5.5 10*3/uL (ref 4.0–10.5)
nRBC: 0.4 % — ABNORMAL HIGH (ref 0.0–0.2)

## 2018-10-16 LAB — GLUCOSE, CAPILLARY
GLUCOSE-CAPILLARY: 93 mg/dL (ref 70–99)
GLUCOSE-CAPILLARY: 282 mg/dL — AB (ref 70–99)
Glucose-Capillary: 101 mg/dL — ABNORMAL HIGH (ref 70–99)
Glucose-Capillary: 96 mg/dL (ref 70–99)
Glucose-Capillary: 321 mg/dL — ABNORMAL HIGH (ref 70–99)

## 2018-10-16 LAB — HEMOGLOBIN A1C
Hgb A1c MFr Bld: 7.1 % — ABNORMAL HIGH (ref 4.8–5.6)
Mean Plasma Glucose: 157.07 mg/dL

## 2018-10-16 LAB — HEMOGLOBIN AND HEMATOCRIT, BLOOD
HCT: 24.7 % — ABNORMAL LOW (ref 36.0–46.0)
Hemoglobin: 8.4 g/dL — ABNORMAL LOW (ref 12.0–15.0)

## 2018-10-16 SURGERY — SIGMOIDOSCOPY, FLEXIBLE

## 2018-10-16 MED ORDER — FLEET ENEMA 7-19 GM/118ML RE ENEM: 1.0000 | ENEMA | Freq: Once | RECTAL | Status: DC

## 2018-10-16 MED ORDER — SODIUM CHLORIDE 0.9 % IV SOLN
INTRAVENOUS | Status: DC
  Administered 2018-10-16: 10:00:00 via INTRAVENOUS

## 2018-10-16 MED ORDER — MESALAMINE 1.2 G PO TBEC: 1.2000 g | DELAYED_RELEASE_TABLET | Freq: Every day | ORAL | Status: DC

## 2018-10-16 MED ORDER — METHYLPREDNISOLONE SODIUM SUCC 125 MG IJ SOLR
40.0000 mg | Freq: Two times a day (BID) | INTRAMUSCULAR | Status: DC
  Administered 2018-10-16 – 2018-10-17 (×2): 40 mg via INTRAVENOUS
  Filled 2018-10-16 (×2): qty 2

## 2018-10-16 MED ORDER — FLEET ENEMA 7-19 GM/118ML RE ENEM
1.0000 | ENEMA | RECTAL | Status: AC
  Administered 2018-10-16: 1 via RECTAL
  Filled 2018-10-16: qty 1

## 2018-10-16 MED ORDER — MESALAMINE 1.2 G PO TBEC
1.2000 g | DELAYED_RELEASE_TABLET | Freq: Every day | ORAL | Status: DC
  Filled 2018-10-16: qty 1

## 2018-10-16 MED ORDER — INSULIN ASPART 100 UNIT/ML ~~LOC~~ SOLN
0.0000 [IU] | Freq: Three times a day (TID) | SUBCUTANEOUS | Status: DC
  Administered 2018-10-16 – 2018-10-17 (×2): 5 [IU] via SUBCUTANEOUS
  Administered 2018-10-17: 9 [IU] via SUBCUTANEOUS

## 2018-10-16 NOTE — Brief Op Note (Signed)
10/15/2018 - 10/16/2018  12:26 PM  PATIENT:  Kristina [name]  73 y.o. female  PRE-OPERATIVE DIAGNOSIS:  Lower GI bleed  POST-OPERATIVE DIAGNOSIS:  Biopsy taken at sigmoid to rule out colitis  PROCEDURE:  Procedure(s): FLEXIBLE SIGMOIDOSCOPY (N/A) BIOPSY  SURGEON:  Surgeon(s) and Role:    * Helmut Hennon, MD - Primary   Findings ------------ -Flexible sigmoidoscopy showed inflammation with diffuse ulceration in the rectosigmoid colon and distal sigmoid colon.  Scope was advanced up to 35 cm.  Finding concerning for colitis.  Could be early ulcerative colitis or nonspecific colitis.  Recommendations ------------------------- -Start IV Solu-Medrol 40 mg twice daily.  Consider switching to oral prednisone or to Uceris (given her history of diabetes) on discharge. -Check CBC in the morning. -Start soft diet -Hopefully discharge home tomorrow -GI will follow  [name], FACP 10/16/2018, 12:28 PM  Contact #  [phone]

## 2018-10-16 NOTE — Progress Notes (Signed)
PROGRESS NOTE  Kristina [name] KHV:[id number] DOB: 22-Dec-1945 DOA: 10/15/2018 PCP: [facility]  HPI/Recap of past 24 hours: Kristina [name] a 73 y.o.femalewith medical history significant ofcolitis, hypertension, hyperlipidemia, diabetes mellitus, asthma, GERD, hypothyroidism, who presents with bloody diarrhea.  Patient states that she has had 2 episode of bloody diarrhea since 11:00PM. The first episode was associated with abdominal cramping. The blood was bright red in color with blood clot in stool. Patient has dizzy and fatigue. Admitted for presumed lower GI bleed.  Presented with hemoglobin drop from 12.9 at baseline to 10.5.    Hospital course complicated by persistent drop in hemoglobin down to 8.4.  GI has been consulted and following.  10/16/18: Patient seen and examined at bedside.  No acute events overnight.  She is alert oriented x3.  Reports rectal bleed again last night after having 2 bowel movements.  Plan for flexible sigmoidoscopy today by GI.   Assessment/Plan: Principal Problem:   GIB (gastrointestinal bleeding) Active Problems:   Essential hypertension   Diabetes mellitus type 2, controlled (HCC)   Hypothyroidism   HLD (hyperlipidemia)   Colitis  GI bleed, presumed lower GI consulted and following Plan for flexible sigmoidoscopy today Bowel prep per GI  Acute blood loss anemia secondary to GI bleed Transfuse with hemoglobin less than 7.0 Baseline hemoglobin appears to be 12 Hemoglobin down to 8.4 Continue to monitor H&H  Nonspecific colitis Continue Apriso and Rowasa as recommended by GI Follows with GI outpatient  Type 2 diabetes with hypoglycemia Suspect hypoglycemia from n.p.o. Hold insulin Obtain A1c Resume insulin sliding scale when no longer n.p.o.  Hypertension Blood pressure stable Maintaining map greater than 65 Continue amlodipine 5 mg twice daily Continue Bystolic 10 mg daily  Hyperlipidemia  Continue Zocor  Hypothyroidism Continue levothyroxine  GERD Continue Protonix p.o. twice daily   DVT ppx: SCD Code Status: Full code Family Communication: None at bedside   Disposition Plan:   Possible discharge tomorrow 10/17/2018 when GI signs off. Consults called:   GI   Admission status: Obs / tele    Objective: Vitals:   10/15/18 1457 10/15/18 2107 10/16/18 0459 10/16/18 0947  BP: (!) 113/49 (!) 117/53 113/61 140/64  Pulse: 81 84 69   Resp: 17 17 18    Temp: 98.5 F (36.9 C) 98 F (36.7 C) 98.9 F (37.2 C)   TempSrc: Oral Oral Oral   SpO2: 97% 98% 97%   Weight:      Height:        Intake/Output Summary (Last 24 hours) at 10/16/2018 1127 Last data filed at 10/16/2018 9643 Gross per 24 hour  Intake 2408.15 ml  Output -  Net 2408.15 ml   Filed Weights   10/15/18 0127  Weight: 77.1 kg    Exam:  . General: 73 y.o. year-old female well developed well nourished in no acute distress.  Alert and oriented x3. . Cardiovascular: Regular rate and rhythm with no rubs or gallops.  No thyromegaly or JVD noted.   Marland Kitchen Respiratory: Clear to auscultation with no wheezes or rales. Good inspiratory effort. . Abdomen: Soft nontender nondistended with normal bowel sounds x4 quadrants. . Musculoskeletal: No lower extremity edema. 2/4 pulses in all 4 extremities. Marland Kitchen Psychiatry: Mood is appropriate for condition and setting   Data Reviewed: CBC: Recent Labs  Lab 10/15/18 0136 10/15/18 0429 10/15/18 0942 10/15/18 1606 10/15/18 2224 10/16/18 0736  WBC 9.2 7.2 5.2 4.7 5.5  --   HGB 10.5* 9.2* 8.8* 8.5*  7.9* 8.4*  HCT 30.1* 27.5* 26.5* 24.3* 23.3* 24.7*  MCV 95.0 97.2 94.0 94.2 94.7  --   PLT 195 161 182 164 149*  --    Basic Metabolic Panel: Recent Labs  Lab 10/15/18 0136 10/15/18 0429  NA 136 136  K 4.7 5.3*  CL 104 110  CO2 25 21*  GLUCOSE 185* 164*  BUN 17 15  CREATININE 0.79 0.72  CALCIUM 8.8* 8.3*   GFR: Estimated Creatinine Clearance: 63 mL/min (by C-G  formula based on SCr of 0.72 mg/dL). Liver Function Tests: Recent Labs  Lab 10/15/18 0136  AST 14*  ALT 10  ALKPHOS 58  BILITOT 0.5  PROT 5.7*  ALBUMIN 3.3*   No results for input(s): LIPASE, AMYLASE in the last 168 hours. No results for input(s): AMMONIA in the last 168 hours. Coagulation Profile: Recent Labs  Lab 10/15/18 0136  INR 1.2   Cardiac Enzymes: No results for input(s): CKTOTAL, CKMB, CKMBINDEX, TROPONINI in the last 168 hours. BNP (last 3 results) No results for input(s): PROBNP in the last 8760 hours. HbA1C: Recent Labs    10/16/18 0736  HGBA1C 7.1*   CBG: Recent Labs  Lab 10/15/18 2059 10/15/18 2200 10/16/18 0320 10/16/18 0729 10/16/18 1047  GLUCAP 50* 115* 101* 96 93   Lipid Profile: No results for input(s): CHOL, HDL, LDLCALC, TRIG, CHOLHDL, LDLDIRECT in the last 72 hours. Thyroid Function Tests: No results for input(s): TSH, T4TOTAL, FREET4, T3FREE, THYROIDAB in the last 72 hours. Anemia Panel: No results for input(s): VITAMINB12, FOLATE, FERRITIN, TIBC, IRON, RETICCTPCT in the last 72 hours. Urine analysis:    Component Value Date/Time   COLORURINE STRAW (A) 03/21/2018 1228   APPEARANCEUR HAZY (A) 03/21/2018 1228   LABSPEC 1.006 03/21/2018 1228   PHURINE 6.0 03/21/2018 1228   GLUCOSEU NEGATIVE 03/21/2018 1228   HGBUR SMALL (A) 03/21/2018 1228   BILIRUBINUR NEGATIVE 03/21/2018 1228   KETONESUR NEGATIVE 03/21/2018 1228   PROTEINUR NEGATIVE 03/21/2018 1228   UROBILINOGEN 0.2 05/01/2015 1551   NITRITE NEGATIVE 03/21/2018 1228   LEUKOCYTESUR LARGE (A) 03/21/2018 1228   Sepsis Labs: @LABRCNTIP (procalcitonin:4,lacticidven:4)  )No results found for this or any previous visit (from the past 240 hour(s)).    Studies: No results found.  Scheduled Meds: . acidophilus  1 capsule Oral Daily  . amLODipine  5 mg Oral BID  . calcium carbonate  1 tablet Oral Q supper  . insulin aspart  0-9 Units Subcutaneous TID WC  . insulin glargine  9  Units Subcutaneous Daily  . levothyroxine  25 mcg Oral QAC breakfast  . mesalamine  1.5 g Oral Daily  . mesalamine  4 g Rectal QHS  . nebivolol  10 mg Oral Daily  . pantoprazole  40 mg Intravenous Q12H  . simvastatin  20 mg Oral q1800  . sodium phosphate  1 enema Rectal Once    Continuous Infusions: . sodium chloride 100 mL/hr at 10/16/18 0725  . sodium chloride 20 mL/hr at 10/16/18 1025     LOS: 0 days     Darlin Drop, MD [facility] Pager [phone]  If 7PM-7AM, please contact night-coverage [email or web address] Password [facility] 10/16/2018, 11:27 AM

## 2018-10-16 NOTE — Progress Notes (Signed)
[facility] Gastroenterology Progress Note  Kristina [name] 73 y.o. September 25, 1945  CC: Lower GI bleed   Subjective: Continues to have bleeding episodes.  Denies abdominal pain, nausea vomiting.  ROS : Denies chest pain and shortness of breath.  Afebrile.   Objective: Vital signs in last 24 hours: Vitals:   10/16/18 0459 10/16/18 0947  BP: 113/61 140/64  Pulse: 69   Resp: 18   Temp: 98.9 F (37.2 C)   SpO2: 97%     Physical Exam:  General:  Alert, cooperative, no distress, appears stated age  Head:  Normocephalic, without obvious abnormality, atraumatic  Eyes:  , EOM's intact,   Lungs:   Clear to auscultation bilaterally, respirations unlabored  Heart:  Regular rate and rhythm, S1, S2 normal  Abdomen:   Soft, non-tender, ND, BS + , no peritoneal signs  Extremities: Extremities normal, atraumatic, no  edema    Lab Results: Recent Labs    10/15/18 0136 10/15/18 0429  NA 136 136  K 4.7 5.3*  CL 104 110  CO2 25 21*  GLUCOSE 185* 164*  BUN 17 15  CREATININE 0.79 0.72  CALCIUM 8.8* 8.3*   Recent Labs    10/15/18 0136  AST 14*  ALT 10  ALKPHOS 58  BILITOT 0.5  PROT 5.7*  ALBUMIN 3.3*   Recent Labs    10/15/18 1606 10/15/18 2224 10/16/18 0736  WBC 4.7 5.5  --   HGB 8.5* 7.9* 8.4*  HCT 24.3* 23.3* 24.7*  MCV 94.2 94.7  --   PLT 164 149*  --    Recent Labs    10/15/18 0136  LABPROT 14.6  INR 1.2      Assessment/Plan: -Rectal bleeding.  Most likely diverticular bleeding vs from colitis.  -History of nonspecific colitis.   -Acute blood loss anemia.  -Colonoscopy in December 2019 by Dr. Matthias Hughs  showed patchy ulceration in the sigmoid colon.  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  She had normal terminal ileum.  Findings were not consistent with either Crohn's disease or ulcerative colitis and hence she was diagnosed with nonspecific colitis and was placed on Apriso.  Recommendations ------------------------- -Flexible sigmoidoscopy  today  -Keep n.p.o. for now.  fleet enema before flex sig  -Further plan based on flexible sigmoidoscopy findings.  Risks (bleeding, infection, bowel perforation that could require surgery, sedation-related changes in cardiopulmonary systems), benefits (identification and possible treatment of source of symptoms, exclusion of certain causes of symptoms), and alternatives (watchful waiting, radiographic imaging studies, empiric medical treatment)  were explained to patient/family in detail and patient wishes to proceed.   [name], FACP 10/16/2018, 10:53 AM  Contact #  [phone]

## 2018-10-16 NOTE — Op Note (Signed)
[facility] Patient Name: Kristina LimesLorraine [name] Procedure Date : 10/16/2018 MRN: [id number] Attending MD: Kathi DerParag Shariq Puig , MD Date of Birth: 06/30/1946 CSN: [id number] Age: 73 Admit Type: Inpatient Procedure:                Flexible Sigmoidoscopy Indications:              Rectal hemorrhage Providers:                Kathi DerParag Mileah Hemmer, MD, Clearnce Sorrelarrie Baker, RN, Harrington ChallengerHope                            Parker, Technician Referring MD:              Medicines:                None Complications:            No immediate complications. Estimated Blood Loss:     Estimated blood loss was minimal. Procedure:                Pre-Anesthesia Assessment:                           - Prior to the procedure, a History and Physical                            was performed, and patient medications and                            allergies were reviewed. The patient's tolerance of                            previous anesthesia was also reviewed. The risks                            and benefits of the procedure and the sedation                            options and risks were discussed with the patient.                            All questions were answered, and informed consent                            was obtained. Prior Anticoagulants: The patient has                            taken no previous anticoagulant or antiplatelet                            agents. After reviewing the risks and benefits, the                            patient was deemed in satisfactory condition to                            undergo the procedure.  After obtaining informed consent, the scope was                            passed under direct vision. The [id number]-[id number] ([id number])                            Olympus gastroscope was introduced through the anus                            and advanced to the 35 cm from the anal verge. The                            flexible sigmoidoscopy was accomplished without                          difficulty. The patient tolerated the procedure                            well. The quality of the bowel preparation was                            adequate. Scope In: Scope Out: Findings:      The perianal and digital rectal examinations were normal.      Inflammation characterized by congestion (edema), erythema, friability,       granularity and ulcerations was found in the rectosigmoid colon and in       the distal sigmoid colon. The rectum was spared. This was mild in       severity, and when compared to previous examinations, the findings are       worsened. Biopsies were taken with a cold forceps for histology.      Internal hemorrhoids were found during retroflexion. The hemorrhoids       were small. Impression:               - Colitis. Inflammation was found. This was mild in                            severity, worsened compared to previous                            examinations. Biopsied.                           - Internal hemorrhoids. Recommendation:           - Soft diet.                           - Await pathology results.                           - Continue present medications. Procedure Code(s):        --- Professional ---                           [id number], Sigmoidoscopy, flexible; with biopsy, single  or multiple Diagnosis Code(s):        --- Professional ---                           K52.9, Noninfective gastroenteritis and colitis,                            unspecified                           K64.8, Other hemorrhoids                           K62.5, Hemorrhage of anus and rectum CPT copyright 2018 American Medical Association. All rights reserved. The codes documented in this report are preliminary and upon coder review may  be revised to meet current compliance requirements. Kathi Der, MD Kathi Der, MD 10/16/2018 12:25:11 PM Number of Addenda: 0

## 2018-10-16 NOTE — Progress Notes (Addendum)
Inpatient Diabetes Program Recommendations  AACE/ADA: New Consensus Statement on Inpatient Glycemic Control (2015)  Target Ranges:  Prepandial:   less than 140 mg/dL      Peak postprandial:   less than 180 mg/dL (1-2 hours)      Critically ill patients:  140 - 180 mg/dL   Lab Results  Component Value Date   GLUCAP 96 10/16/2018   HGBA1C 5.7 (H) 03/23/2018    Review of Glycemic Control Results for KATESSA, VATALARO (MRN [id number]) as of 10/16/2018 09:12  Ref. Range 10/15/2018 20:59 10/15/2018 22:00 10/16/2018 03:20 10/16/2018 07:29  Glucose-Capillary Latest Ref Range: 70 - 99 mg/dL 50 (L) 449 (H) 675 (H) 96   Diabetes history: Type 2 DM Outpatient Diabetes medications: Lantus 17 units QD, Metformin 1500 mg QD Current orders for Inpatient glycemic control:Lantus 9 units QD, Novolog 0-9 units TID  Inpatient Diabetes Program Recommendations:    Noted hypoglycemia of 50 mg/dL on 9/16 following Novolog 3 units x1 (correction).   Consider discontinuing Lantus 9 units QD today and reassess insulin requirement.   Of note, last A1C was from 02/2018, consider repeating A1C?  Thanks, Lujean Rave, MSN, RNC-OB Diabetes Coordinator [phone] (8a-5p)

## 2018-10-17 LAB — CBC
HCT: 26.7 % — ABNORMAL LOW (ref 36.0–46.0)
Hemoglobin: 9 g/dL — ABNORMAL LOW (ref 12.0–15.0)
MCH: 31.5 pg (ref 26.0–34.0)
MCHC: 33.7 g/dL (ref 30.0–36.0)
MCV: 93.4 fL (ref 80.0–100.0)
Platelets: 167 10*3/uL (ref 150–400)
RBC: 2.86 MIL/uL — ABNORMAL LOW (ref 3.87–5.11)
RDW: 12.5 % (ref 11.5–15.5)
WBC: 4.3 10*3/uL (ref 4.0–10.5)
nRBC: 0 % (ref 0.0–0.2)

## 2018-10-17 LAB — GLUCOSE, CAPILLARY
GLUCOSE-CAPILLARY: 338 mg/dL — AB (ref 70–99)
Glucose-Capillary: 274 mg/dL — ABNORMAL HIGH (ref 70–99)
Glucose-Capillary: 459 mg/dL — ABNORMAL HIGH (ref 70–99)
Glucose-Capillary: 193 mg/dL — ABNORMAL HIGH (ref 70–99)

## 2018-10-17 LAB — POTASSIUM: Potassium: 3.8 mmol/L (ref 3.5–5.1)

## 2018-10-17 MED ORDER — INSULIN ASPART 100 UNIT/ML ~~LOC~~ SOLN
7.0000 [IU] | Freq: Once | SUBCUTANEOUS | Status: AC
  Administered 2018-10-17: 7 [IU] via SUBCUTANEOUS

## 2018-10-17 MED ORDER — PANTOPRAZOLE SODIUM 40 MG PO TBEC: 40.0000 mg | DELAYED_RELEASE_TABLET | Freq: Every day | ORAL | 0 refills | Status: DC

## 2018-10-17 MED ORDER — BUDESONIDE 3 MG PO CPEP: 9.0000 mg | ORAL_CAPSULE | Freq: Every day | ORAL | 0 refills | Status: DC

## 2018-10-17 MED ORDER — METFORMIN HCL ER 500 MG PO TB24: 1500.0000 mg | ORAL_TABLET | Freq: Every day | ORAL | Status: DC

## 2018-10-17 MED ORDER — BUDESONIDE 3 MG PO CPEP
9.0000 mg | ORAL_CAPSULE | Freq: Every day | ORAL | Status: DC
  Administered 2018-10-17: 9 mg via ORAL
  Filled 2018-10-17: qty 3

## 2018-10-17 MED ORDER — METFORMIN HCL ER 500 MG PO TB24
1500.0000 mg | ORAL_TABLET | Freq: Every day | ORAL | Status: DC
  Administered 2018-10-17: 1500 mg via ORAL
  Filled 2018-10-17: qty 3

## 2018-10-17 MED ORDER — INSULIN GLARGINE 100 UNIT/ML ~~LOC~~ SOLN
17.0000 [IU] | Freq: Every day | SUBCUTANEOUS | Status: DC
  Administered 2018-10-17: 17 [IU] via SUBCUTANEOUS
  Filled 2018-10-17: qty 0.17

## 2018-10-17 MED ORDER — PANTOPRAZOLE SODIUM 40 MG PO TBEC
40.0000 mg | DELAYED_RELEASE_TABLET | Freq: Every day | ORAL | Status: DC
  Administered 2018-10-17: 40 mg via ORAL
  Filled 2018-10-17: qty 1

## 2018-10-17 NOTE — Progress Notes (Signed)
Pt rechecked cbg 193, discharged accompanied by the Nurse tech via wheelchair, and family member waiting outside the hospital, no complain of pain, no s/s of hypo/hyperglycemia, ambulates and tolerates her meals.

## 2018-10-17 NOTE — Progress Notes (Signed)
[facility] Gastroenterology Progress Note  Kristina [name] 73 y.o. 1945/11/23  CC: Lower GI bleed   Subjective: Feeling better.  No further bleeding episodes.  Tolerating diet.    Objective: Vital signs in last 24 hours: Vitals:   10/16/18 2016 10/17/18 0447  BP: 131/61 137/69  Pulse: 89 83  Resp: 17 17  Temp: 98.8 F (37.1 C) 98.3 F (36.8 C)  SpO2: 97% 99%    Physical Exam:  General.  Alert/oriented x3.  Not in acute distress ABD.  Soft, nontender, nondistended, bowel sounds present.  No peritoneal signs Lab Results: Recent Labs    10/15/18 0136 10/15/18 0429 10/17/18 0626  NA 136 136  --   K 4.7 5.3* 3.8  CL 104 110  --   CO2 25 21*  --   GLUCOSE 185* 164*  --   BUN 17 15  --   CREATININE 0.79 0.72  --   CALCIUM 8.8* 8.3*  --    Recent Labs    10/15/18 0136  AST 14*  ALT 10  ALKPHOS 58  BILITOT 0.5  PROT 5.7*  ALBUMIN 3.3*   Recent Labs    10/15/18 2224 10/16/18 0736 10/17/18 0224  WBC 5.5  --  4.3  HGB 7.9* 8.4* 9.0*  HCT 23.3* 24.7* 26.7*  MCV 94.7  --  93.4  PLT 149*  --  167   Recent Labs    10/15/18 0136  LABPROT 14.6  INR 1.2      Assessment/Plan: -Rectal bleeding.  Most likely from underlying colitis.  Resolved now. -History of nonspecific colitis.   -Acute blood loss anemia.  -Colonoscopy in December 2019 by Dr. Matthias Hughs  showed patchy ulceration in the sigmoid colon.  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  She had normal terminal ileum.  Findings were not consistent with either Crohn's disease or ulcerative colitis and hence she was diagnosed with nonspecific colitis and was placed on Apriso.  Recommendations ------------------------- -Flexible sigmoidoscopy showed inflammation with diffuse ulceration in the rectosigmoid colon and distal sigmoid colon.  Scope was advanced up to 35 cm.  Finding concerning for colitis.  Could be early ulcerative colitis or nonspecific colitis.  -Feeling much better on IV  prednisone.  - She will be discharged home on budesonide.  -Follow-up with primary GI Dr. Matthias Hughs in 4 to 6 weeks.  -Okay to discharge from GI standpoint.  GI will sign off.  Call us back if needed  [name], FACP 10/17/2018, 11:03 AM  Contact #  [phone]

## 2018-10-17 NOTE — Progress Notes (Signed)
Pt for discharge going home, discontinued peripheral IV line, given health teachings, next appointment, due med explained and understood, no complain of pain, given all her personal belongings, waiting for her family member to pick her up.

## 2018-10-17 NOTE — Progress Notes (Signed)
Dr. Margo Aye ordered to give another 9 units of novolog and she said she can go home.

## 2018-10-17 NOTE — Progress Notes (Signed)
Brief RD Note  RD spoke with pt per RN request, who reports pt was requesting diet education prior to discharge today.    Lab Results  Component Value Date   HGBA1C 7.1 (H) 10/16/2018   PTA DM medications are 1500 mg metformin daily and 17 units insulin glargine daily.   Labs reviewed: CBGS: 274-459 (inpatient orders for glycemic control are 0-9 units insulin aspart TID with meals and 17 units insulin aspart daily).   Spoke with pt, who was very anxious at time of visit (RD was unable to introduce self due to pt voicing concerns as soon as entered room). Pt is very anxious over high blood sugar this afternoon (over 400). Of note, pt was on IV prednisone, which likely exacerbated event. RD actively listened to pt concerns and provided emotional support through reflective listening and education/reassurance.   Pt reports that she is on metformin and insulin at home and PCP manages DM care. Pt reports very good control PTA; CBGS generally in 90-100 range. She is compliant with medications and diet.   RD discussed with pt how acute illness can lead to stress response and elevate blood sugars. Also discussed impact of steroids on blood sugars. Educated pt on how DM is treated differently at home vs in the hospital  (frequent CBGS checks and insulin) in order to obtain stricter control and more predictable response to blood sugar management. Encouraged pt to continue to monitor CBGS at home and to call PCP is pt continues to experience recurrent hyperglycemic episodes. Also assured pt that RN would provide pt with all necessary medications and instructions prior to discharge so she is best prepared to go home. Teach back method use. Pt appeared much calmer after speaking with this RD.   Expect fair compliance.  Body mass index is 29.18 kg/m. Pt meets criteria for overweight based on current BMI.  Current diet order is soft, patient is consuming approximately 70-100% of meals at this time. Labs and  medications reviewed. No further nutrition interventions warranted at this time. RD contact information provided. If additional nutrition issues arise, please re-consult RD.  Case discussed with RN, who reports she spoke with MD regarding hyperglycemia- plan to add another insulin dose and re-check within an hour. Also informed Pensions consultant of above conversation.    Nayan Proch A. Mayford Knife, RD, LDN, CDCES Registered Dietitian II Certified Diabetes Care and Education Specialist Pager: [phone] After hours Pager: [phone]

## 2018-10-17 NOTE — Progress Notes (Signed)
Pt given 7 units of novolog instead of 9 units will repeat cbg after an hour.

## 2018-10-17 NOTE — Progress Notes (Signed)
Pt cbg 338 after giving 9 units of novolog, paged Dr. Margo Aye.

## 2018-10-17 NOTE — Discharge Summary (Signed)
Discharge Summary  Kristina [name] UJW:[id number] DOB: [birth year]  PCP: [facility]  Admit date: 10/15/2018 Discharge date: 10/17/2018  Time spent: 35 minutes  Recommendations for Outpatient Follow-up:  1. Follow-up with your gastroenterologist 2. Follow-up with your PCP 3. Take your medications as prescribed  Discharge Diagnoses:  Active Hospital Problems   Diagnosis Date Noted  . GIB (gastrointestinal bleeding) 10/15/2018  . HLD (hyperlipidemia) 10/15/2018  . Colitis 10/15/2018  . Hypothyroidism   . Diabetes mellitus type 2, controlled (HCC) 05/01/2015  . Essential hypertension     Resolved Hospital Problems  No resolved problems to display.    Discharge Condition: Stable  Diet recommendation: Resume previous diet  Vitals:   10/16/18 2016 10/17/18 0447  BP: 131/61 137/69  Pulse: 89 83  Resp: 17 17  Temp: 98.8 F (37.1 C) 98.3 F (36.8 C)  SpO2: 97% 99%    History of present illness:   Kristina [name] a 73 y.o.femalewith medical history significant ofcolitis, hypertension, hyperlipidemia, diabetes mellitus, asthma, GERD, hypothyroidism, who presents with bloody diarrhea.  Patient states that she has had 2 episode of bloody diarrhea since 11:00PM. The first episode was associated with abdominal cramping. The blood was bright red in color with blood clot in stool. Patient has dizzy and fatigue. Admitted for presumed lower GI bleed. Presented with hemoglobin drop from 12.9 at baseline to 10.5.   Hospital course complicated by drop in hemoglobin down to 8.4.  GI consulted and followed.  Had a flexible sigmoidoscopy which revealed findings concerning for colitis.  Recommendations to start IV Solu-Medrol 40 mg twice daily then p.o. prednisone or budesonide for discharge planning.  10/17/18: Patient seen and examined at bedside.  No acute events overnight.  She states she feels well.  She has no new complaints.  Hemoglobin stable  9.0 from 8.2 yesterday.  On the day of discharge, the patient was hemodynamically stable.  She will need to follow-up with GI and her primary care provider posthospitalization.  Patient understands and agrees to plan.  Hospital Course:  Principal Problem:   GIB (gastrointestinal bleeding) Active Problems:   Essential hypertension   Diabetes mellitus type 2, controlled (HCC)   Hypothyroidism   HLD (hyperlipidemia)   Colitis  Resolving GI bleed, presumed lower GI consulted and followed Flexible sigmoidoscopy done on 10/16/2018 revealed findings concerning for colitis Recommendations for IV Solu-Medrol 40 mg twice daily P.o. prednisone or budesonide at discharge Follow-up with GI outpatient Continue medications as recommended by GI  Resolving acute blood loss anemia secondary to GI bleed Baseline hemoglobin appears to be 12 Hemoglobin stable at 9.0 on 10/17/2018 from 8.4 yesterday Follow-up with your primary care provider and GI  Nonspecific colitis Continue Apriso and Rowasa as recommended by GI Follows with GI outpatient Received IV Solu-Medrol at 40 mg twice daily Continue budesonide Follow-up with GI outpatient  Type 2 diabetes with hypoglycemia Suspect hypoglycemia from n.p.o. Hypoglycemia has resolved after resuming feeding Hemoglobin A1c 7.1 on 10/16/2018 Resume home medications  Hyperkalemia, unclear etiology Potassium 5.3 on 10/15/2018 Repeated potassium pending Follow-up with your PCP  Hypertension Blood pressure stable Continue amlodipine 5 mg twice daily Continue Bystolic 10 mg daily Follow-up with your primary care provider Vital signs reviewed on 10/16/2016 are stable  Hyperlipidemia Continue Zocor  Hypothyroidism Continue levothyroxine  GERD Continue Protonix p.o. twice daily    Code Status:Full code  Consults called: GI    Discharge Exam: BP 137/69 (BP Location: Left Arm)   Pulse 83  Temp 98.3 F (36.8 C) (Oral)   Resp 17    Ht 5\' 4"  (1.626 m)   Wt 77.1 kg   SpO2 99%   BMI 29.18 kg/m  . General: 73 y.o. year-old female well developed well nourished in no acute distress.  Alert and oriented x3. . Cardiovascular: Regular rate and rhythm with no rubs or gallops.  No thyromegaly or JVD noted.   Marland Kitchen Respiratory: Clear to auscultation with no wheezes or rales. Good inspiratory effort. . Abdomen: Soft nontender nondistended with normal bowel sounds x4 quadrants. . Musculoskeletal: No lower extremity edema. 2/4 pulses in all 4 extremities. Marland Kitchen Psychiatry: Mood is appropriate for condition and setting  Discharge Instructions You were cared for by a hospitalist during your hospital stay. If you have any questions about your discharge medications or the care you received while you were in the hospital after you are discharged, you can call the unit and asked to speak with the hospitalist on call if the hospitalist that took care of you is not available. Once you are discharged, your primary care physician will handle any further medical issues. Please note that NO REFILLS for any discharge medications will be authorized once you are discharged, as it is imperative that you return to your primary care physician (or establish a relationship with a primary care physician if you do not have one) for your aftercare needs so that they can reassess your need for medications and monitor your lab values.   Allergies as of 10/17/2018      Reactions   Ace Inhibitors Cough   Ciprofloxacin Other (See Comments)   Tendinitis in heel and posterior right LE   Doxycycline Other (See Comments)   Pt does not remember what the reaction was - many years ago   Shrimp [shellfish Allergy] Diarrhea, Nausea And Vomiting, Rash      Medication List    STOP taking these medications   losartan 100 MG tablet Commonly known as:  COZAAR   meloxicam 15 MG tablet Commonly known as:  MOBIC     TAKE these medications   acetaminophen 500 MG tablet  Commonly known as:  TYLENOL Take 1,000 mg by mouth 2 (two) times daily as needed (pain).   ALIGN PO Take 1 tablet by mouth daily.   amLODipine 10 MG tablet Commonly known as:  NORVASC Take 1 tablet (10 mg total) by mouth daily. What changed:    how much to take  when to take this   aspirin 81 MG tablet Take 81 mg by mouth daily.   budesonide 3 MG 24 hr capsule Commonly known as:  ENTOCORT EC Take 3 capsules (9 mg total) by mouth daily.   Bystolic 10 MG tablet Generic drug:  nebivolol TAKE 1 TABLET (10 MG TOTAL) BY MOUTH DAILY. What changed:  See the new instructions.   CALCIUM PO Take 500 mg by mouth at bedtime.   hyoscyamine 0.125 MG SL tablet Commonly known as:  LEVSIN SL Place 0.125 mg under the tongue every 4 (four) hours as needed for cramping.   insulin glargine 100 unit/mL Sopn Commonly known as:  LANTUS Inject 17 Units into the skin daily.   levothyroxine 25 MCG tablet Commonly known as:  SYNTHROID, LEVOTHROID Take 25 mcg by mouth daily before breakfast.   mesalamine 0.375 g 24 hr capsule Commonly known as:  APRISO Take 4 capsules by mouth daily.   metFORMIN 500 MG 24 hr tablet Commonly known as:  GLUCOPHAGE-XR Take 3 tablets (  1,500 mg total) by mouth daily.   pantoprazole 40 MG tablet Commonly known as:  PROTONIX Take 1 tablet (40 mg total) by mouth daily.   simethicone 125 MG chewable tablet Commonly known as:  MYLICON Chew 125 mg by mouth every 6 (six) hours as needed for flatulence.   simvastatin 20 MG tablet Commonly known as:  ZOCOR Take 1 tablet by mouth daily.   tiZANidine 4 MG tablet Commonly known as:  ZANAFLEX Take 4 mg by mouth 3 (three) times daily as needed for muscle spasms.      Allergies  Allergen Reactions  . Ace Inhibitors Cough  . Ciprofloxacin Other (See Comments)    Tendinitis in heel and posterior right LE  . Doxycycline Other (See Comments)    Pt does not remember what the reaction was - many years ago  .  Shrimp [Shellfish Allergy] Diarrhea, Nausea And Vomiting and Rash   Follow-up Information    [facility]. Call in 1 day(s).   Specialty:  Family Medicine Why:  Please call for a post hospital follow-up appointment Contact information: [address] [place] [address] [phone]        [name], MD .   Specialty:  Cardiology Contact information: [phone] [address] [place] [address] [phone]        Kathi Der, MD. Call in 1 day(s).   Specialty:  Gastroenterology Why:  Please call for a post hospital follow-up appointment Contact information: [address] [place] [address] [phone]            The results of significant diagnostics from this hospitalization (including imaging, microbiology, ancillary and laboratory) are listed below for reference.    Significant Diagnostic Studies: No results found.  Microbiology: No results found for this or any previous visit (from the past 240 hour(s)).   Labs: Basic Metabolic Panel: Recent Labs  Lab 10/15/18 0136 10/15/18 0429 10/17/18 0626  NA 136 136  --   K 4.7 5.3* 3.8  CL 104 110  --   CO2 25 21*  --   GLUCOSE 185* 164*  --   BUN 17 15  --   CREATININE 0.79 0.72  --   CALCIUM 8.8* 8.3*  --    Liver Function Tests: Recent Labs  Lab 10/15/18 0136  AST 14*  ALT 10  ALKPHOS 58  BILITOT 0.5  PROT 5.7*  ALBUMIN 3.3*   No results for input(s): LIPASE, AMYLASE in the last 168 hours. No results for input(s): AMMONIA in the last 168 hours. CBC: Recent Labs  Lab 10/15/18 0429 10/15/18 0942 10/15/18 1606 10/15/18 2224 10/16/18 0736 10/17/18 0224  WBC 7.2 5.2 4.7 5.5  --  4.3  HGB 9.2* 8.8* 8.5* 7.9* 8.4* 9.0*  HCT 27.5* 26.5* 24.3* 23.3* 24.7* 26.7*  MCV 97.2 94.0 94.2 94.7  --  93.4  PLT 161 182 164 149*  --  167   Cardiac Enzymes: No results for input(s): CKTOTAL, CKMB, CKMBINDEX, TROPONINI in the  last 168 hours. BNP: BNP (last 3 results) No results for input(s): BNP in the last 8760 hours.  ProBNP (last 3 results) No results for input(s): PROBNP in the last 8760 hours.  CBG: Recent Labs  Lab 10/16/18 0320 10/16/18 0729 10/16/18 1047 10/16/18 1646 10/16/18 2122  GLUCAP 101* 96 93 282* 321*       Signed:  Darlin Drop, MD [facility] 10/17/2018, 8:14 AM

## 2018-10-17 NOTE — Progress Notes (Signed)
Pt cbg was 457 Dr. Margo Aye made aware given 9 units novolog will recheck cbg after an hour, pt asymptomatic.

## 2018-10-17 NOTE — Evaluation (Signed)
Physical Therapy Evaluation Patient Details Name: Kristina [name] MRN: [id number] DOB: Sep 20, 1945 Today's Date: 10/17/2018   History of Present Illness  73 yo female admitted to ED on 3/18 with GIB, pt with sigmoidoscopy 3/19 positive for suspected colitis. PMH imcludes HLD, history of GIB, DMII, HTN, IBS, achilles tendon repair.   Clinical Impression   Pt presents with decreased activity tolerance, generalized LE weakness, no pain reported GI- or R knee-related. Pt to benefit from acute PT to address deficits. Pt ambulated hallway distance with occasional steadying required with IV pole or hallway rails. No PT follow up recommended at this time, pt close to baseline level of functioning. PT to progress mobility as tolerated, and will continue to follow acutely.      Follow Up Recommendations No PT follow up    Equipment Recommendations  None recommended by PT    Recommendations for Other Services       Precautions / Restrictions Precautions Precautions: Fall(moderate fall risk ) Restrictions Weight Bearing Restrictions: No      Mobility  Bed Mobility Overal bed mobility: Modified Independent Bed Mobility: Supine to Sit;Sit to Supine     Supine to sit: HOB elevated;Modified independent (Device/Increase time) Sit to supine: HOB elevated;Modified independent (Device/Increase time)   General bed mobility comments: Increased time to perform.   Transfers Overall transfer level: Needs assistance Equipment used: None Transfers: Sit to/from Stand Sit to Stand: Supervision         General transfer comment: supervision for safety. Pt steady upon standing.   Ambulation/Gait Ambulation/Gait assistance: Min Emergency planning/management officer (Feet): 500 Feet Assistive device: IV Pole;None Gait Pattern/deviations: Step-through pattern;WFL(Within Functional Limits) Gait velocity: WFL   General Gait Details: Min guard to supervision for safety. Close guard when challenging pt  gait. Occasionally reaching for environment to steady self.   Stairs            Wheelchair Mobility    Modified Rankin (Stroke Patients Only)       Balance Overall balance assessment: Needs assistance   Sitting balance-Leahy Scale: Good       Standing balance-Leahy Scale: Fair Standing balance comment: able to stand without UE support, reaches for environment for steadying occasionally              High level balance activites: Turns;Head turns High Level Balance Comments: gait speed and parameters within normal limits with horizontal/vertical head turns, turn and stop.              Pertinent Vitals/Pain Pain Assessment: No/denies pain    Home Living Family/patient expects to be discharged to:: Private residence Living Arrangements: Alone Available Help at Discharge: Friend(s);Available PRN/intermittently;Family(Pt's sister and son live 2 hours away but occasionally assist her if needed, pt has neighbor that she is friends with) Type of Home: House Home Access: Stairs to enter Entrance Stairs-Rails: None Entrance Stairs-Number of Steps: 1 Home Layout: One level Home Equipment: Cane - single point      Prior Function Level of Independence: Independent with assistive device(s)         Comments: Pt uses cane for ambulation occasionally. Pt with history of falls, once at home with cane, once at church, and another while performing yard work. Pt likes to walk outside and go to silver sneakers.      Hand Dominance   Dominant Hand: Left    Extremity/Trunk Assessment   Upper Extremity Assessment Upper Extremity Assessment: Overall WFL for tasks assessed    Lower Extremity Assessment Lower  Extremity Assessment: RLE deficits/detail;Generalized weakness RLE Deficits / Details: Pt reports intermittent R knee pain, none present today     Cervical / Trunk Assessment Cervical / Trunk Assessment: Normal  Communication   Communication: No difficulties   Cognition Arousal/Alertness: Awake/alert Behavior During Therapy: WFL for tasks assessed/performed Overall Cognitive Status: Within Functional Limits for tasks assessed                                        General Comments      Exercises     Assessment/Plan    PT Assessment Patient needs continued PT services  PT Problem List Decreased safety awareness;Decreased activity tolerance;Decreased balance       PT Treatment Interventions DME instruction;Functional mobility training;Gait training;Therapeutic activities;Neuromuscular re-education;Therapeutic exercise;Stair training;Patient/family education;Balance training    PT Goals (Current goals can be found in the Care Plan section)  Acute Rehab PT Goals Patient Stated Goal: return to walking for exercise, eventually return to silver sneakers PT Goal Formulation: With patient Time For Goal Achievement: 10/31/18 Potential to Achieve Goals: Good    Frequency Min 3X/week   Barriers to discharge        Co-evaluation               AM-PAC PT "6 Clicks" Mobility  Outcome Measure Help needed turning from your back to your side while in a flat bed without using bedrails?: None Help needed moving from lying on your back to sitting on the side of a flat bed without using bedrails?: A Little Help needed moving to and from a bed to a chair (including a wheelchair)?: None Help needed standing up from a chair using your arms (e.g., wheelchair or bedside chair)?: None Help needed to walk in hospital room?: A Little Help needed climbing 3-5 steps with a railing? : A Little 6 Click Score: 21    End of Session Equipment Utilized During Treatment: Gait belt Activity Tolerance: Patient tolerated treatment well Patient left: in bed;with call bell/phone within reach Nurse Communication: Mobility status PT Visit Diagnosis: Other abnormalities of gait and mobility (R26.89)    Time: 4944-9675 PT Time Calculation  (min) (ACUTE ONLY): 30 min   Charges:   PT Evaluation $PT Eval Low Complexity: 1 Low PT Treatments $Gait Training: 8-22 mins      Nicola Police, PT Acute Rehabilitation Services Pager [phone]  Office [phone]   Tyrone Apple D Despina Hidden 10/17/2018, 12:44 PM

## 2018-10-17 NOTE — Discharge Instructions (Signed)
Gastrointestinal Bleeding ° °Gastrointestinal bleeding is bleeding somewhere along the path food travels through the body (digestive tract). This path is anywhere between the mouth and the opening of the butt (anus). You may have blood in your poop (stools) or have black poop. If you throw up (vomit), there may be blood in it. °This condition can be mild, serious, or even life-threatening. If you have a lot of bleeding, you may need to stay in the hospital. °Follow these instructions at home: °· Take over-the-counter and prescription medicines only as told by your doctor. °· Eat foods that have a lot of fiber in them. These foods include whole grains, fruits, and vegetables. You can also try eating 1-3 prunes each day. °· Drink enough fluid to keep your pee (urine) clear or pale yellow. °· Keep all follow-up visits as told by your doctor. This is important. °Contact a doctor if: °· Your symptoms do not get better. °Get help right away if: °· Your bleeding gets worse. °· You feel dizzy or you pass out (faint). °· You feel weak. °· You have very bad cramps in your back or belly (abdomen). °· You pass large clumps of blood (clots) in your poop. °· Your symptoms are getting worse. °This information is not intended to replace advice given to you by your health care provider. Make sure you discuss any questions you have with your health care provider. °Document Released: 04/24/2008 Document Revised: 12/22/2015 Document Reviewed: 01/03/2015 °Elsevier Interactive Patient Education © 2019 [employer] ° °

## 2019-02-13 ENCOUNTER — Emergency Department (HOSPITAL_COMMUNITY): Payer: Medicare Other

## 2019-02-13 ENCOUNTER — Other Ambulatory Visit: Payer: Self-pay

## 2019-02-13 ENCOUNTER — Emergency Department (HOSPITAL_COMMUNITY)
Admission: EM | Admit: 2019-02-13 | Discharge: 2019-02-13 | Disposition: A | Discharge status: Home / Self Care | Payer: Medicare Other | Attending: Emergency Medicine | Admitting: Emergency Medicine

## 2019-02-13 ENCOUNTER — Emergency Department (HOSPITAL_BASED_OUTPATIENT_CLINIC_OR_DEPARTMENT_OTHER): Payer: Medicare Other

## 2019-02-13 DIAGNOSIS — Z7982 Long term (current) use of aspirin: Secondary | ICD-10-CM | POA: Diagnosis not present

## 2019-02-13 DIAGNOSIS — M7989 Other specified soft tissue disorders: Secondary | ICD-10-CM | POA: Diagnosis not present

## 2019-02-13 DIAGNOSIS — Z794 Long term (current) use of insulin: Secondary | ICD-10-CM | POA: Insufficient documentation

## 2019-02-13 DIAGNOSIS — M79605 Pain in left leg: Secondary | ICD-10-CM | POA: Diagnosis not present

## 2019-02-13 DIAGNOSIS — E039 Hypothyroidism, unspecified: Secondary | ICD-10-CM | POA: Insufficient documentation

## 2019-02-13 DIAGNOSIS — E119 Type 2 diabetes mellitus without complications: Secondary | ICD-10-CM | POA: Diagnosis not present

## 2019-02-13 DIAGNOSIS — I10 Essential (primary) hypertension: Secondary | ICD-10-CM | POA: Diagnosis not present

## 2019-02-13 DIAGNOSIS — J45909 Unspecified asthma, uncomplicated: Secondary | ICD-10-CM | POA: Insufficient documentation

## 2019-02-13 DIAGNOSIS — R52 Pain, unspecified: Secondary | ICD-10-CM

## 2019-02-13 DIAGNOSIS — R600 Localized edema: Secondary | ICD-10-CM | POA: Diagnosis not present

## 2019-02-13 DIAGNOSIS — Z79899 Other long term (current) drug therapy: Secondary | ICD-10-CM | POA: Insufficient documentation

## 2019-02-13 LAB — CBC WITH DIFFERENTIAL/PLATELET
Abs Immature Granulocytes: 0.05 10*3/uL (ref 0.00–0.07)
Basophils Absolute: 0.1 10*3/uL (ref 0.0–0.1)
Basophils Relative: 1 %
Eosinophils Absolute: 0 10*3/uL (ref 0.0–0.5)
Eosinophils Relative: 0 %
HCT: 37.6 % (ref 36.0–46.0)
Hemoglobin: 11.7 g/dL — ABNORMAL LOW (ref 12.0–15.0)
Immature Granulocytes: 1 %
Lymphocytes Relative: 8 %
Lymphs Abs: 0.8 10*3/uL (ref 0.7–4.0)
MCH: 27.1 pg (ref 26.0–34.0)
MCHC: 31.1 g/dL (ref 30.0–36.0)
MCV: 87 fL (ref 80.0–100.0)
Monocytes Absolute: 0.3 10*3/uL (ref 0.1–1.0)
Monocytes Relative: 3 %
Neutro Abs: 8.3 10*3/uL — ABNORMAL HIGH (ref 1.7–7.7)
Neutrophils Relative %: 87 %
Platelets: 259 10*3/uL (ref 150–400)
RBC: 4.32 MIL/uL (ref 3.87–5.11)
RDW: 17.7 % — ABNORMAL HIGH (ref 11.5–15.5)
WBC: 9.5 10*3/uL (ref 4.0–10.5)
nRBC: 0 % (ref 0.0–0.2)

## 2019-02-13 LAB — COMPREHENSIVE METABOLIC PANEL
ALT: 13 U/L (ref 0–44)
AST: 16 U/L (ref 15–41)
Albumin: 4.2 g/dL (ref 3.5–5.0)
Alkaline Phosphatase: 53 U/L (ref 38–126)
Anion gap: 12 (ref 5–15)
BUN: 17 mg/dL (ref 8–23)
CO2: 24 mmol/L (ref 22–32)
Calcium: 9.9 mg/dL (ref 8.9–10.3)
Chloride: 104 mmol/L (ref 98–111)
Creatinine, Ser: 0.81 mg/dL (ref 0.44–1.00)
GFR calc Af Amer: >60 mL/min (ref >60)
GFR calc non Af Amer: >60 mL/min (ref >60)
Glucose, Bld: 133 mg/dL — ABNORMAL HIGH (ref 70–99)
Potassium: 4.2 mmol/L (ref 3.5–5.1)
Sodium: 140 mmol/L (ref 135–145)
Total Bilirubin: 0.5 mg/dL (ref 0.3–1.2)
Total Protein: 7.3 g/dL (ref 6.5–8.1)

## 2019-02-13 NOTE — ED Notes (Signed)
Blood draw attempted x2 unsuccessful 

## 2019-02-13 NOTE — Progress Notes (Signed)
Left lower extremity venous duplex has been completed. Preliminary results can be found in CV Proc through chart review.  Results were given to [facility].  02/13/19 10:39 AM Carlos Levering RVT

## 2019-02-13 NOTE — ED Provider Notes (Signed)
[facility] DEPT Provider Note   CSN: [id number] Arrival date & time: 02/13/19  7341    History   Chief Complaint Chief Complaint  Patient presents with   Leg Pain    left    HPI Kristina [name] is a 73 y.o. female with a PMH of Diabetes, GERD, HTN, HLD, and Diverticulitis presenting with constant left leg pain onset 3 days ago. Patient arrived via EMS from home. Patient describes pain as a dull ache. Patient reports she has applied ice and heat with relief. Patient states nothing makes symptoms worse. Patient reports pain began on her left ankle and has radiated up to her left leg. Patient denies pain in left knee or hip. Patient reports she called her PCP and was advised to come to the ER or urgent care. Patient denies known injury. Patient reports mild edema. Patient denies erythema. Patient denies hx of DVT/PE. Patient denies fever, cough, congestion, recent surgery, recent travel, shortness of breath, or chest pain. Patient denies nausea, vomiting, or abdominal pain.      HPI  Past Medical History:  Diagnosis Date   Anxiety    Asthma    Diabetes ([place])    Diabetes mellitus without complication ([place])    Diverticulitis    GERD (gastroesophageal reflux disease)    HTN (hypertension)    Hypercholesteremia    Hyperlipidemia    Hypertension    Obesity    Rosacea    Thyroid disease     Patient Active Problem List   Diagnosis Date Noted   HLD (hyperlipidemia) 10/15/2018   GIB (gastrointestinal bleeding) 10/15/2018   Colitis 10/15/2018   Hypokalemia    Hypothyroidism    Acute pyelonephritis 03/22/2018   Pyelonephritis    UTI (urinary tract infection) 03/21/2018   IBS (irritable bowel syndrome) 03/21/2018   Morbid obesity ([place]) 08/08/2015   Acute hyponatremia 05/01/2015   Gastroenteritis, acute 05/01/2015   Diabetes mellitus type 2, controlled ([place]) 05/01/2015   Asthma in remission 05/01/2015   Diarrhea     Cough variant asthma 04/19/2015   Acute bronchitis 03/08/2015   Hypothyroidism, adult 02/19/2015   Dyspnea 02/18/2015   Upper airway cough syndrome 01/07/2015   Chest pain 09/15/2014   Pre-syncope 12/08/2013   PVC (premature ventricular contraction) 08/26/2013   Rosacea    Anxiety    Diverticulitis    Diabetes ([place])    Essential hypertension    Obesity     Past Surgical History:  Procedure Laterality Date   ACHILLES TENDON REPAIR     APPENDECTOMY     BIOPSY  10/16/2018   Procedure: BIOPSY;  Surgeon: Otis Brace, MD;  Location: [facility];  Service: Gastroenterology;;   CESAREAN SECTION     CHOLECYSTECTOMY     FLEXIBLE SIGMOIDOSCOPY N/A 10/16/2018   Procedure: FLEXIBLE SIGMOIDOSCOPY;  Surgeon: Otis Brace, MD;  Location: [facility];  Service: Gastroenterology;  Laterality: N/A;   hammertoe       OB History    Gravida  0   Para  0   Term  0   Preterm  0   AB  0   Living        SAB  0   TAB  0   Ectopic  0   Multiple      Live Births               Home Medications    Prior to Admission medications   Medication Sig Start Date End Date Taking?  Authorizing Provider  acetaminophen (TYLENOL) 500 MG tablet Take 1,000 mg by mouth 2 (two) times daily as needed (pain).    [provider]  amLODipine (NORVASC) 10 MG tablet Take 1 tablet (10 mg total) by mouth daily. Patient taking differently: Take 5 mg by mouth 2 (two) times daily.  06/10/18   Kroeger, Ovidio KinKrista M., PA-C  aspirin 81 MG tablet Take 81 mg by mouth daily.    [provider]  budesonide (ENTOCORT EC) 3 MG 24 hr capsule Take 3 capsules (9 mg total) by mouth daily. 10/17/18   Hall, Carole N, DO  BYSTOLIC 10 MG tablet TAKE 1 TABLET (10 MG TOTAL) BY MOUTH DAILY. Patient taking differently: Take 10 mg by mouth daily.  07/15/15   Nyoka CowdenWert, Michael B, MD  CALCIUM PO Take 500 mg by mouth at bedtime.    [provider]  hyoscyamine (LEVSIN SL) 0.125 MG  SL tablet Place 0.125 mg under the tongue every 4 (four) hours as needed for cramping.     [provider]  insulin glargine (LANTUS) 100 unit/mL SOPN Inject 17 Units into the skin daily.    [provider]  levothyroxine (SYNTHROID, LEVOTHROID) 25 MCG tablet Take 25 mcg by mouth daily before breakfast.    [provider]  mesalamine (APRISO) 0.375 g 24 hr capsule Take 4 capsules by mouth daily. 08/28/18   [provider]  metFORMIN (GLUCOPHAGE-XR) 500 MG 24 hr tablet Take 3 tablets (1,500 mg total) by mouth daily. 04/05/18   Lenox PondsSilva Zapata, Edwin, MD  pantoprazole (PROTONIX) 40 MG tablet Take 1 tablet (40 mg total) by mouth daily. 10/17/18   Darlin DropHall, Carole N, DO  Probiotic Product (ALIGN PO) Take 1 tablet by mouth daily.    [provider]  simethicone (MYLICON) 125 MG chewable tablet Chew 125 mg by mouth every 6 (six) hours as needed for flatulence.    [provider]  simvastatin (ZOCOR) 20 MG tablet Take 1 tablet by mouth daily. 08/05/13   [provider]  tiZANidine (ZANAFLEX) 4 MG tablet Take 4 mg by mouth 3 (three) times daily as needed for muscle spasms.    [provider]    Family History Family History  Problem Relation Age of Onset   Hypertension Mother    Heart failure Mother    Hypotension Father    Dementia Father     Social History Social History   Tobacco Use   Smoking status: Never Smoker   Smokeless tobacco: Never Used  Substance Use Topics   Alcohol use: No    Alcohol/week: 0.0 standard drinks   Drug use: No     Allergies   Ace inhibitors, Ciprofloxacin, Doxycycline, and Shrimp [shellfish allergy]   Review of Systems Review of Systems  Constitutional: Negative for chills, diaphoresis and fever.  Respiratory: Negative for cough and shortness of breath.   Cardiovascular: Negative for chest pain.  Gastrointestinal: Negative for abdominal pain, nausea and vomiting.  Endocrine: Negative  for cold intolerance and heat intolerance.  Genitourinary: Negative for dysuria.  Musculoskeletal: Positive for arthralgias and joint swelling. Negative for back pain and gait problem.       Pt reports leg pain.   Skin: Negative for rash.  Allergic/Immunologic: Negative for immunocompromised state.  Hematological: Negative for adenopathy.    Physical Exam Updated Vital Signs BP 140/74    Pulse 71    Temp (S) 99.8 F (37.7 C)    Resp 16    SpO2 100%  Physical Exam Vitals signs and nursing note reviewed.  Constitutional:      General: She is not in acute distress.    Appearance: She is well-developed. She is not diaphoretic.  HENT:     Head: Normocephalic and atraumatic.  Neck:     Musculoskeletal: Normal range of motion.  Cardiovascular:     Rate and Rhythm: Normal rate and regular rhythm.     Heart sounds: Normal heart sounds. No murmur. No friction rub. No gallop.   Pulmonary:     Effort: Pulmonary effort is normal. No respiratory distress.     Breath sounds: Normal breath sounds. No wheezing or rales.  Abdominal:     Palpations: Abdomen is soft.     Tenderness: There is no abdominal tenderness.  Musculoskeletal: Normal range of motion.     Right knee: Normal. She exhibits normal range of motion, no swelling and no effusion. No tenderness found.     Left knee: Normal. She exhibits normal range of motion, no swelling, no effusion and no ecchymosis. No tenderness found.     Right ankle: Normal. She exhibits normal range of motion, no swelling and no ecchymosis. No tenderness.     Left ankle: She exhibits normal range of motion, no swelling, no ecchymosis, no deformity, no laceration and normal pulse. Tenderness. Lateral malleolus tenderness found. No medial malleolus, no AITFL, no CF ligament, no posterior TFL, no head of 5th metatarsal and no proximal fibula tenderness found.     Right lower leg: She exhibits swelling. She exhibits no tenderness and no bony tenderness. Edema  present.     Left lower leg: She exhibits tenderness, bony tenderness and swelling. She exhibits no deformity and no laceration. Edema present.     Comments: Mild edema noted on legs bilaterally. Mild tenderness to palpation of lateral malleolus of left ankle and left tibia. No erythema noted. Full ROM of knee and ankles bilaterally. 2+ DP pulses. Sensation intact.   Skin:    General: Skin is warm.     Findings: No erythema or rash.  Neurological:     Mental Status: She is alert.     ED Treatments / Results  Labs (all labs ordered are listed, but only abnormal results are displayed) Labs Reviewed  COMPREHENSIVE METABOLIC PANEL - Abnormal; Notable for the following components:      Result Value   Glucose, Bld 133 (*)    All other components within normal limits  CBC WITH DIFFERENTIAL/PLATELET - Abnormal; Notable for the following components:   Hemoglobin 11.7 (*)    RDW 17.7 (*)    Neutro Abs 8.3 (*)    All other components within normal limits  CBC WITH DIFFERENTIAL/PLATELET    EKG None  Radiology Dg Tibia/fibula Left  Result Date: 02/13/2019 CLINICAL DATA:  Leg pain 2 days. EXAM: LEFT TIBIA AND FIBULA - 2 VIEW COMPARISON:  Left ankle 04/12/2010 FINDINGS: Negative for fracture.  No focal bony lesion Degenerative change in the knee. IMPRESSION: Degenerative change in the knee.  No acute abnormality. Electronically Signed   By: Marlan Palauharles  Clark M.D.   On: 02/13/2019 11:15   Dg Ankle Complete Left  Result Date: 02/13/2019 CLINICAL DATA:  Ankle pain EXAM: LEFT ANKLE COMPLETE - 3+ VIEW COMPARISON:  04/12/2010 FINDINGS: Normal alignment no fracture. Ankle joint normal. Calcaneal spurring similar to the prior study. Progressive calcification in the Achilles tendon. No significant arthropathy. IMPRESSION: Negative for fracture Calcaneal spurring with progressive calcification in the Achilles tendon. Electronically Signed  By: Marlan Palau M.D.   On: 02/13/2019 11:16   Vas [place] Lower  Extremity Venous (dvt) (only [facility] & [facility] 7a-7p)  Result Date: 02/13/2019  Lower Venous Study Indications: Pain, and Swelling.  Risk Factors: None identified. Comparison Study: No prior studies. Performing Technologist: Chanda Busing RVT  Examination Guidelines: A complete evaluation includes B-mode imaging, spectral Doppler, color Doppler, and power Doppler as needed of all accessible portions of each vessel. Bilateral testing is considered an integral part of a complete examination. Limited examinations for reoccurring indications may be performed as noted.  +-----+---------------+---------+-----------+----------+-------+  RIGHT Compressibility Phasicity Spontaneity Properties Summary  +-----+---------------+---------+-----------+----------+-------+  CFV   Full            Yes       Yes                             +-----+---------------+---------+-----------+----------+-------+   +---------+---------------+---------+-----------+----------+-------+  LEFT      Compressibility Phasicity Spontaneity Properties Summary  +---------+---------------+---------+-----------+----------+-------+  CFV       Full            Yes       Yes                             +---------+---------------+---------+-----------+----------+-------+  SFJ       Full                                                      +---------+---------------+---------+-----------+----------+-------+  FV Prox   Full                                                      +---------+---------------+---------+-----------+----------+-------+  FV Mid    Full                                                      +---------+---------------+---------+-----------+----------+-------+  FV Distal Full                                                      +---------+---------------+---------+-----------+----------+-------+  PFV       Full                                                      +---------+---------------+---------+-----------+----------+-------+  POP       Full             Yes       Yes                             +---------+---------------+---------+-----------+----------+-------+  PTV  Full                                                      +---------+---------------+---------+-----------+----------+-------+  PERO      Full                                                      +---------+---------------+---------+-----------+----------+-------+     Summary: Right: No evidence of common femoral vein obstruction. Left: There is no evidence of deep vein thrombosis in the lower extremity. No cystic structure found in the popliteal fossa.  *See table(s) above for measurements and observations.    Preliminary     Procedures Procedures (including critical care time)  Medications Ordered in ED Medications - No data to display   Initial Impression / Assessment and Plan / ED Course  I have reviewed the triage vital signs and the nursing notes.  Pertinent labs & imaging results that were available during my care of the patient were reviewed by me and considered in my medical decision making (see chart for details).  Clinical Course as of Feb 12 1237  Fri Feb 13, 2019  1118 Degenerative change in the knee.  No acute abnormality.  DG Tibia/Fibula Left [AH]  1120 Negative for fracture. Calcaneal spurring with progressive calcification in the Achilles tendon.    DG Ankle Complete Left [AH]  1148 Low hemoglobin noted at 11.7. This appears to be higher than baseline.   Hemoglobin(!): 11.7 [AH]  1211 Summary: Right: No evidence of common femoral vein obstruction. Left: There is no evidence of deep vein thrombosis in the lower extremity. No cystic structure found in the popliteal fossa.    VAS US LOWER EXTREMITY VENOUS (DVT) (ONLY [facility] & [facility] 7a-7p) [AH]    Clinical Course User Index [AH] Leretha DykesHernandez, Konnor Vondrasek P, PA-C      Patient presents with left leg pain. No signs of erythema noted. WBCs are within normal limits. X rays negative for acute abnormality. DVT  study is negative. Discussed return precautions. Advised patient to follow up with PCP. Advised patient to apply heat/ice and take tylenol for symptoms. Patient is stable in no acute distress. Patient states she understands and agrees with plan.   Findings and plan of care discussed with supervising physician Dr. Effie ShyWentz who personally evaluated and examined this patient.  Final Clinical Impressions(s) / ED Diagnoses   Final diagnoses:  Left leg pain    ED Discharge Orders    None       Leretha DykesHernandez, Montague Corella P, [place] JerseyPA-C 02/13/19 1238    Mancel BaleWentz, Elliott, MD 02/13/19 [phone]

## 2019-02-13 NOTE — ED Notes (Signed)
Pt discharge to lobby via wheelchair. Pt agreeable to wait for ride in ED lobby.

## 2019-02-13 NOTE — ED Notes (Signed)
Lab reported receipt of CBC recollect.

## 2019-02-13 NOTE — ED Notes (Signed)
Patient transported to X-ray 

## 2019-02-13 NOTE — ED Notes (Signed)
Discharge paperwork reviewed with pt, pt verbalized understanding.  She reported that she was awaiting a call back from friend to come pick her up.

## 2019-02-13 NOTE — ED Notes (Signed)
US at bedside

## 2019-02-13 NOTE — ED Notes (Signed)
Pt denies any recent falls, any ankle injuries.  She states that the last 2 days her left ankle has been more swollen than normal.  Reports taking acetaminophen for the pain with some relief.  Has taken no acetaminophen today.

## 2019-02-13 NOTE — ED Provider Notes (Signed)
  Face-to-face evaluation   History: She complains of pain in the left ankle and left lower leg for several days and was worried about a blood clot, so came here.  No known trauma.  She also has mild low back pain.  She is able ambulate.  Physical exam: Alert, calm, cooperative.  Left leg nontender to palpation.  Mild left knee swelling.  No tenderness of left knee or left ankle.  There is mild tenderness over the left lateral lower leg.  There are no deformities of the left leg.  Medical screening examination/treatment/procedure(s) were conducted as a shared visit with non-physician practitioner(s) and myself.  I personally evaluated the patient during the encounter   Daleen Bo, MD 02/13/19 1739

## 2019-02-13 NOTE — Discharge Instructions (Addendum)
You have been seen today for leg pain. Please read and follow all provided instructions.   1. Medications: tylenol for pain, usual home medications 2. Treatment: rest, drink plenty of fluids, apply heat/ice as needed 3. Follow Up: Please follow up with your primary doctor in 2-5 days for discussion of your diagnoses and further evaluation after today's visit; if you do not have a primary care doctor use the resource guide provided to find one; Please return to the ER for any new or worsening symptoms. Please obtain all of your results from medical records or have your doctors office obtain the results - share them with your doctor - you should be seen at your doctors office. Call today to arrange your follow up.   Take medications as prescribed. Please review all of the medicines and only take them if you do not have an allergy to them. Return to the emergency room for worsening condition or new concerning symptoms. Follow up with your regular doctor. If you don't have a regular doctor use one of the numbers below to establish a primary care doctor.  Please be aware that if you are taking birth control pills, taking other prescriptions, ESPECIALLY ANTIBIOTICS may make the birth control ineffective - if this is the case, either do not engage in sexual activity or use alternative methods of birth control such as condoms until you have finished the medicine and your family doctor says it is OK to restart them. If you are on a blood thinner such as COUMADIN, be aware that any other medicine that you take may cause the coumadin to either work too much, or not enough - you should have your coumadin level rechecked in next 7 days if this is the case.  ?  It is also a possibility that you have an allergic reaction to any of the medicines that you have been prescribed - Everybody reacts differently to medications and while MOST people have no trouble with most medicines, you may have a reaction such as nausea,  vomiting, rash, swelling, shortness of breath. If this is the case, please stop taking the medicine immediately and contact your physician.  ?  You should return to the ER if you develop severe or worsening symptoms.   Emergency Department Resource Guide 1) Find a Doctor and Pay Out of Pocket Although you won't have to find out who is covered by your insurance plan, it is a good idea to ask around and get recommendations. You will then need to call the office and see if the doctor you have chosen will accept you as a new patient and what types of options they offer for patients who are self-pay. Some doctors offer discounts or will set up payment plans for their patients who do not have insurance, but you will need to ask so you aren't surprised when you get to your appointment.  2) Contact Your Local Health Department Not all health departments have doctors that can see patients for sick visits, but many do, so it is worth a call to see if yours does. If you don't know where your local health department is, you can check in your phone book. The CDC also has a tool to help you locate your state's health department, and many state websites also have listings of all of their local health departments.  3) Find a [facility] If your illness is not likely to be very severe or complicated, you may want to try a walk in  clinic. These are popping up all over the country in pharmacies, drugstores, and shopping centers. They're usually staffed by nurse practitioners or physician assistants that have been trained to treat common illnesses and complaints. They're usually fairly quick and inexpensive. However, if you have serious medical issues or chronic medical problems, these are probably not your best option.  No Primary Care Doctor: Call Health Connect at  [phone] - they can help you locate a primary care doctor that  accepts your insurance, provides certain services, etc. Physician Referral Service-  [phone]  Chronic Pain Problems: Organization         Address  Phone   Notes  [facility]  [phone] Patients need to be referred by their primary care doctor.   Medication Assistance: Organization         Address  Phone   Notes  [facility] Medication [facility] [address] [place], [place] [address] [phone] --Must be a resident of [facility] -- Must have NO insurance coverage whatsoever (no Medicaid/ Medicare, etc.) -- The pt. MUST have a primary care doctor that directs their care regularly and follows them in the community   [facility]  [phone]   [employer]  [phone]    Agencies that provide inexpensive medical care: Organization         Address  Phone   Notes  [place]  [phone]   [facility]    [phone]   [facility] [place], [place] [address] [phone]   [place] [address], [place] [phone]   [facility]    [phone]   [facility]    [phone]   [place] and [place] [address], [place] Phone:  [phone], Fax:  [phone] Hours of Operation:  9 am - 6 pm, M-F.  Also accepts Medicaid/Medicare and self-pay.  [facility] for [address], [address], [place] Phone: [phone], Fax: [phone]. Hours of Operation:  8:30 am - 5:30 pm, M-F.  Also accepts Medicaid and self-pay.  [facility] [address] [place] Phone: [phone]   [place], [place], [place] [phone], [address]. [phone] Mondays & Thursdays: 7-9 AM.  First 15 patients are seen on a first come, first serve basis.    [place] Providers:  Organization         Address  Phone   Notes  [facility] [address],   [phone] Also accepts self-pay patients.  [facility] [address], [place]  [phone]   [place], [address], [place] [phone]   [facility] [address] [place] [phone]   Lucianne Lei [address], [place]   [phone] Only accepts [place] [facility] [place] patients after they have their name applied to their card.   Self-Pay (no insurance) in [facility]:  Organization         Address  Phone   Notes  Sickle Cell Patients, [facility] [place] [phone]   [facility] [place] [phone]   [facility] [place]  [place], [address], [place] [phone]   [facility]/Dr. Osei-Bonsu  [address] [place] or [address], [place] [phone] Phone number for both [place] and [place] locations is the same.  Urgent Medical and [facility] [address] [place] [phone]   [facility] [address] [place] or [address] [phone]   [facility] [address], [place] [phone], phone; [phone], fax Sees patients 1st and 3rd Saturday of every month.  Must not qualify for public or private insurance (i.e. Medicaid, Medicare, [place] Health Choice, Veterans' Benefits)  Household income should be no more than 200% of the poverty level The clinic cannot treat you if you are pregnant or think you are pregnant  Sexually transmitted diseases are not treated at the clinic.

## 2019-02-13 NOTE — ED Triage Notes (Signed)
Pt BIBA from home, c/o dull aching pain on left leg.  Denies SHOB, n/v.    AOx4, ambulatory on scene.

## 2020-01-04 DIAGNOSIS — K529 Noninfective gastroenteritis and colitis, unspecified: Secondary | ICD-10-CM | POA: Diagnosis not present

## 2020-01-04 DIAGNOSIS — K580 Irritable bowel syndrome with diarrhea: Secondary | ICD-10-CM | POA: Diagnosis not present

## 2020-01-11 DIAGNOSIS — D692 Other nonthrombocytopenic purpura: Secondary | ICD-10-CM | POA: Diagnosis not present

## 2020-01-11 DIAGNOSIS — D699 Hemorrhagic condition, unspecified: Secondary | ICD-10-CM | POA: Diagnosis not present

## 2020-01-11 DIAGNOSIS — R238 Other skin changes: Secondary | ICD-10-CM | POA: Diagnosis not present

## 2020-01-14 ENCOUNTER — Encounter: Payer: Medicare PPO | Attending: Family Medicine | Admitting: Registered"

## 2020-01-14 ENCOUNTER — Encounter: Payer: Self-pay | Admitting: Registered"

## 2020-01-14 ENCOUNTER — Other Ambulatory Visit: Payer: Self-pay

## 2020-01-14 DIAGNOSIS — E1169 Type 2 diabetes mellitus with other specified complication: Secondary | ICD-10-CM | POA: Diagnosis not present

## 2020-01-14 NOTE — Patient Instructions (Addendum)
Instructions/Goals:  -Eat every 3-4 hours. Recommend 2-3 carbohydrates per meal (30-45 g carbohydrates per meal).  -Choose foods that do well for your UC and then use the plate to have good ratios for your blood sugar.   -Check blood sugar 2 times daily-fasting and 1 time 1-2 hours after a meal.  -When having a low, take 4 glucose tablets to meet the 15g carbohydrate needs to bring your blood sugar back up and recheck blood sugar in 15 minutes. Repeat if still 70 or less and have a small balanced snack to help maintain it.   -Recommend having a snack between dinner and bedtime to avoid having more than 3.5 hours between eating and going to sleep. This may help prevent morning low. Recommend having a balanced snack that you know does well on your stomach.

## 2020-01-14 NOTE — Progress Notes (Signed)
Diabetes Self-Management Education  Visit Type: First/Initial  Appt. Start Time: 1409 Appt. End Time: 1610  [name], identified by name and date of birth, is a 74 y.o. female with a diagnosis of Diabetes: Type 2.   ASSESSMENT  There were no vitals taken for this visit. There is no height or weight on file to calculate BMI.   Pt reports she has been dx with UC and one of her UC medications causes blood sugar to increase. Reports she would like more information about what is ok to have with UC. Pt reports she has had diabetes for more than 20 years and feels pretty informed and comfortable managing her diabetes prior to UC dx.   Pt reports her A1c came to it's lowest, 7.0 before being dx with UC. Pt also has concerns about knee issue she is having as well.   Pt reports her UC is considered mild. Reports at worst point in past she was having diarrhea daily, but now has not had a flare in 3-4 weeks ago.   Pt checks fasting blood sugar each morning. Reports she used to get a headache if having a low. Reports having some lows now without noticing so if she has hard time sleeping will check around 4-5 AM. Lows are usually during night or early morning. Pt reports if 70 or less she will take 2 glucose tablets (8 g glucose) and go back to bed. Reports she does not recheck before laying back down. Checks 1-2 times, usually twice daily. Reports if she feels a little strange will check her blood sugar.   Blood sugar Ranges: Fasting: 71-125 Postprandial: ~230 10/26/19: HgbA1c: 8.1  Pt reports having 3 meals per day, sometimes snacks. Tries not to eat after 5-6PM. Pt goes to bed around 930 PM.  Beverages: water 6 x 16 oz bottles    Pt reports that fruits do well for her. Reports roasted vegetables have been doing well also and peanut butter. Reports not having issue any issues with caffeine intake. Tends to get decaf when having coffee.    Diabetes Self-Management Education - 01/14/20  1422      Visit Information   Visit Type First/Initial      Initial Visit   Diabetes Type Type 2    Are you currently following a meal plan? No    Are you taking your medications as prescribed? Yes    Date Diagnosed 20+ years ago      Health Coping   How would you rate your overall health? Good      Psychosocial Assessment   Patient Belief/Attitude about Diabetes Other (comment)   Need to manage diabetes and UC   Self-management support Doctor's office    Other persons present Patient    Patient Concerns Nutrition/Meal planning;Other (comment)   Managing diabetes and UC   Special Needs Unable to determine;None    Preferred Learning Style No preference indicated    Learning Readiness Ready    How often do you need to have someone help you when you read instructions, pamphlets, or other written materials from your doctor or pharmacy? 1 - Never    What is the last grade level you completed in school? College      Pre-Education Assessment   Patient understands the diabetes disease and treatment process. Demonstrates understanding / competency    Patient understands incorporating nutritional management into lifestyle. Needs Instruction    Patient undertands incorporating physical activity into lifestyle. Demonstrates understanding / competency  Patient understands using medications safely. Demonstrates understanding / competency    Patient understands monitoring blood glucose, interpreting and using results Needs Instruction    Patient understands prevention, detection, and treatment of acute complications. Demonstrates understanding / competency    Patient understands prevention, detection, and treatment of chronic complications. Demonstrates understanding / competency    Patient understands how to develop strategies to address psychosocial issues. Demonstrates understanding / competency    Patient understands how to develop strategies to promote health/change behavior. Needs  Instruction      Complications   Last HgB A1C per patient/outside source 8 %    How often do you check your blood sugar? 1-2 times/day    Fasting Blood glucose range (mg/dL) 96-295    Postprandial Blood glucose range (mg/dL) >284    Number of hypoglycemic episodes per month 5    Can you tell when your blood sugar is low? Yes    What do you do if your blood sugar is low? Used to feel pain in head, but not always now. Checks if feeling off.    Number of hyperglycemic episodes per week --   several   Have you had a dilated eye exam in the past 12 months? No    Have you had a dental exam in the past 12 months? Yes    Are you checking your feet? Yes    How many days per week are you checking your feet? 2      Dietary Intake   Breakfast 2 scrambled eggs, white bread toast, water    Lunch [employer] grilled chicken sandwich, Diet Mountain Dew    Snack (afternoon) apple    Dinner 5 PM: roasted potatoes, carrots, 1 white english muffin, water    Snack (evening) None reported.      Exercise   Exercise Type ADL's    How many days per week to you exercise? 0    How many minutes per day do you exercise? 0    Total minutes per week of exercise 0      Patient Education   Previous Diabetes Education Yes (please comment)   Wants help with diabetes with UC   Nutrition management  Meal options for control of blood glucose level and chronic complications.;Reviewed blood glucose goals for pre and post meals and how to evaluate the patients' food intake on their blood glucose level.;Carbohydrate counting;Food label reading, portion sizes and measuring food.;Role of diet in the treatment of diabetes and the relationship between the three main macronutrients and blood glucose level    Physical activity and exercise  Role of exercise on diabetes management, blood pressure control and cardiac health.    Monitoring Interpreting lab values - A1C, lipid, urine microalbumina.;Identified appropriate SMBG and/or A1C  goals.;Daily foot exams;Yearly dilated eye exam;Taught/discussed recording of test results and interpretation of SMBG.    Acute complications Discussed and identified patients' treatment of hyperglycemia.;Taught treatment of hypoglycemia - the 15 rule.    Chronic complications Dental care;Retinopathy and reason for yearly dilated eye exams;Identified and discussed with patient  current chronic complications    Psychosocial adjustment Worked with patient to identify barriers to care and solutions;Identified and addressed patients feelings and concerns about diabetes      Individualized Goals (developed by patient)   Nutrition Follow meal plan discussed;General guidelines for healthy choices and portions discussed    Physical Activity --   Try out chair exercises. Follow MD recommendations for PA.   Medications take my medication  as prescribed    Monitoring  test my blood glucose as discussed      Post-Education Assessment   Patient understands the diabetes disease and treatment process. Demonstrates understanding / competency    Patient understands incorporating nutritional management into lifestyle. Demonstrates understanding / competency    Patient undertands incorporating physical activity into lifestyle. Demonstrates understanding / competency    Patient understands using medications safely. Demonstrates understanding / competency    Patient understands monitoring blood glucose, interpreting and using results Demonstrates understanding / competency    Patient understands prevention, detection, and treatment of acute complications. Demonstrates understanding / competency    Patient understands prevention, detection, and treatment of chronic complications. Demonstrates understanding / competency    Patient understands how to develop strategies to address psychosocial issues. Demonstrates understanding / competency    Patient understands how to develop strategies to promote health/change  behavior. Demonstrates understanding / competency      Outcomes   Expected Outcomes Demonstrated interest in learning. Expect positive outcomes    Future DMSE --   Pt to call back to scheduled appt in about 6 weeks   Program Status Completed           Individualized Plan for Diabetes Self-Management Training:   Learning Objective:  Patient will have a greater understanding of diabetes self-management. Patient education plan is to attend individual and/or group sessions per assessed needs and concerns.   Plan: Provided education regarding managing diabetes and UC. Discussed using balance of food groups shown on plate and carbohydrate recommendations for diabetes management and then choosing which foods fit into this plan using list of recommended foods for UC provided. Encouraged pt to check blood sugar 2 times each day while working to improve blood sugar. Discussed proper tx of hypoglycemia-15 g carbohydrate which is 4 of the 4g glucose tablets instead of 2 which pt has been taking. Also discussed need to wait 15 minutes and recheck as pt reports going back to bed before checking again to ensure blood sugar comes to to normal range. Discussed having an evening snack as there is extended time between pt's dinner and bedtime and report of lows occurring overnight/early morning. Pt was agreeable to information/goals discussed.   Instructions/Goals:  -Eat every 3-4 hours. Recommend 2-3 carbohydrates per meal (30-45 g carbohydrates per meal).  -Choose foods that do well for your UC and then use the plate to have good ratios for your blood sugar.   -Check blood sugar 2 times daily-fasting and 1 time 1-2 hours after a meal.  -When having a low, take 4 glucose tablets to meet the 15g carbohydrate needs to bring your blood sugar back up and recheck blood sugar in 15 minutes. Repeat if still 70 or less and have a small balanced snack to help maintain it.   -Recommend having a snack between dinner  and bedtime to avoid having more than 3.5 hours between eating and going to sleep. This may help prevent morning low. Recommend having a balanced snack that you know does well on your stomach.   Patient Instructions  Instructions/Goals:  -Eat every 3-4 hours. Recommend 2-3 carbohydrates per meal (30-45 g carbohydrates per meal).  -Choose foods that do well for your UC and then use the plate to have good ratios for your blood sugar.   -Check blood sugar 2 times daily-fasting and 1 time 1-2 hours after a meal.  -When having a low, take 4 glucose tablets to meet the 15g carbohydrate needs to bring  your blood sugar back up and recheck blood sugar in 15 minutes. Repeat if still 70 or less and have a small balanced snack to help maintain it.   -Recommend having a snack between dinner and bedtime to avoid having more than 3.5 hours between eating and going to sleep. This may help prevent morning low. Recommend having a balanced snack that you know does well on your stomach.      Expected Outcomes:  Demonstrated interest in learning. Expect positive outcomes  Education material provided: ADA - How to Thrive: A Guide for Your Journey with Diabetes, Meal plan card, My Plate and Snack sheet  If problems or questions, patient to contact team via:  Phone and Email  Future DSME appointment:  (Pt to call back to scheduled appt in about 6 weeks)

## 2020-01-18 DIAGNOSIS — M79672 Pain in left foot: Secondary | ICD-10-CM | POA: Diagnosis not present

## 2020-01-18 DIAGNOSIS — I10 Essential (primary) hypertension: Secondary | ICD-10-CM | POA: Diagnosis not present

## 2020-01-26 ENCOUNTER — Emergency Department (HOSPITAL_COMMUNITY): Payer: Medicare PPO

## 2020-01-26 ENCOUNTER — Encounter (HOSPITAL_COMMUNITY): Payer: Self-pay

## 2020-01-26 ENCOUNTER — Emergency Department (HOSPITAL_COMMUNITY)
Admission: EM | Admit: 2020-01-26 | Discharge: 2020-01-26 | Disposition: A | Discharge status: Home / Self Care | Payer: Medicare PPO | Attending: Emergency Medicine | Admitting: Emergency Medicine

## 2020-01-26 ENCOUNTER — Emergency Department (HOSPITAL_BASED_OUTPATIENT_CLINIC_OR_DEPARTMENT_OTHER): Payer: Medicare PPO

## 2020-01-26 ENCOUNTER — Other Ambulatory Visit: Payer: Self-pay

## 2020-01-26 DIAGNOSIS — R2243 Localized swelling, mass and lump, lower limb, bilateral: Secondary | ICD-10-CM | POA: Diagnosis not present

## 2020-01-26 DIAGNOSIS — I110 Hypertensive heart disease with heart failure: Secondary | ICD-10-CM | POA: Insufficient documentation

## 2020-01-26 DIAGNOSIS — Z7982 Long term (current) use of aspirin: Secondary | ICD-10-CM | POA: Insufficient documentation

## 2020-01-26 DIAGNOSIS — J45909 Unspecified asthma, uncomplicated: Secondary | ICD-10-CM | POA: Insufficient documentation

## 2020-01-26 DIAGNOSIS — I509 Heart failure, unspecified: Secondary | ICD-10-CM | POA: Diagnosis not present

## 2020-01-26 DIAGNOSIS — E039 Hypothyroidism, unspecified: Secondary | ICD-10-CM | POA: Diagnosis not present

## 2020-01-26 DIAGNOSIS — Z794 Long term (current) use of insulin: Secondary | ICD-10-CM | POA: Diagnosis not present

## 2020-01-26 DIAGNOSIS — R609 Edema, unspecified: Secondary | ICD-10-CM

## 2020-01-26 DIAGNOSIS — Z881 Allergy status to other antibiotic agents status: Secondary | ICD-10-CM | POA: Diagnosis not present

## 2020-01-26 DIAGNOSIS — E119 Type 2 diabetes mellitus without complications: Secondary | ICD-10-CM | POA: Insufficient documentation

## 2020-01-26 DIAGNOSIS — Z79899 Other long term (current) drug therapy: Secondary | ICD-10-CM | POA: Insufficient documentation

## 2020-01-26 DIAGNOSIS — R0602 Shortness of breath: Secondary | ICD-10-CM | POA: Diagnosis not present

## 2020-01-26 DIAGNOSIS — M7989 Other specified soft tissue disorders: Secondary | ICD-10-CM | POA: Diagnosis not present

## 2020-01-26 LAB — BASIC METABOLIC PANEL
Anion gap: 9 (ref 5–15)
BUN: 9 mg/dL (ref 8–23)
CO2: 28 mmol/L (ref 22–32)
Calcium: 9.5 mg/dL (ref 8.9–10.3)
Chloride: 102 mmol/L (ref 98–111)
Creatinine, Ser: 0.77 mg/dL (ref 0.44–1.00)
GFR calc Af Amer: >60 mL/min (ref >60)
GFR calc non Af Amer: >60 mL/min (ref >60)
Glucose, Bld: 181 mg/dL — ABNORMAL HIGH (ref 70–99)
Potassium: 4.2 mmol/L (ref 3.5–5.1)
Sodium: 139 mmol/L (ref 135–145)

## 2020-01-26 LAB — CBC WITH DIFFERENTIAL/PLATELET
Abs Immature Granulocytes: 0.02 10*3/uL (ref 0.00–0.07)
Basophils Absolute: 0 10*3/uL (ref 0.0–0.1)
Basophils Relative: 1 %
Eosinophils Absolute: 0.1 10*3/uL (ref 0.0–0.5)
Eosinophils Relative: 2 %
HCT: 42.1 % (ref 36.0–46.0)
Hemoglobin: 14.2 g/dL (ref 12.0–15.0)
Immature Granulocytes: 0 %
Lymphocytes Relative: 11 %
Lymphs Abs: 0.8 10*3/uL (ref 0.7–4.0)
MCH: 32.6 pg (ref 26.0–34.0)
MCHC: 33.7 g/dL (ref 30.0–36.0)
MCV: 96.8 fL (ref 80.0–100.0)
Monocytes Absolute: 0.4 10*3/uL (ref 0.1–1.0)
Monocytes Relative: 5 %
Neutro Abs: 6.3 10*3/uL (ref 1.7–7.7)
Neutrophils Relative %: 81 %
Platelets: 203 10*3/uL (ref 150–400)
RBC: 4.35 MIL/uL (ref 3.87–5.11)
RDW: 13.2 % (ref 11.5–15.5)
WBC: 7.7 10*3/uL (ref 4.0–10.5)
nRBC: 0 % (ref 0.0–0.2)

## 2020-01-26 LAB — PROTIME-INR
INR: 0.9 (ref 0.8–1.2)
Prothrombin Time: 11.9 seconds (ref 11.4–15.2)

## 2020-01-26 LAB — BRAIN NATRIURETIC PEPTIDE: B Natriuretic Peptide: 283.4 pg/mL — ABNORMAL HIGH (ref 0.0–100.0)

## 2020-01-26 MED ORDER — FUROSEMIDE 40 MG PO TABS: 40.0000 mg | ORAL_TABLET | Freq: Every day | ORAL | 0 refills | Status: DC

## 2020-01-26 NOTE — ED Triage Notes (Signed)
Pt presents with c/o bilateral leg swelling, left worse than the right. Pt reports she called her MD today and was told to come here. Pt also reports some shortness of breath that is not constant but worse with activity.

## 2020-01-26 NOTE — ED Provider Notes (Signed)
[facility]-EMERGENCY DEPT Provider Note   CSN: [id number] Arrival date & time: 01/26/20  1012     History Chief Complaint  Patient presents with  . Leg Swelling  . Shortness of Breath    Kristina [name] is a 74 y.o. female.  She has had peripheral edema in her lower extremities on and off for a while.  Thought to be due to to her amlodipine.  Improved with lowering her dose.  The past 4 to 5 days she has noticed more swelling in her feet and ankles and it is becoming painful.  She has noticed some dilated veins on her lower extremities.  She feels a little more short of breath over the past few weeks sometimes with activity, not consistent.  No chest pain fevers chills.  The history is provided by the patient.  Shortness of Breath Severity:  Moderate Onset quality:  Gradual Timing:  Intermittent Progression:  Unchanged Chronicity:  New Context: activity   Relieved by:  Rest Worsened by:  Activity Ineffective treatments:  None tried Associated symptoms: no abdominal pain, no chest pain, no cough, no diaphoresis, no fever, no headaches, no rash, no sore throat and no sputum production   Leg Pain Location:  Ankle, foot and leg Injury: no   Leg location:  L lower leg and R lower leg Ankle location:  L ankle and R ankle Foot location:  L foot and R foot Pain details:    Quality:  Pressure   Severity:  Moderate   Onset quality:  Gradual   Timing:  Intermittent   Progression:  Unchanged Chronicity:  New Relieved by:  Elevation Worsened by:  Activity Ineffective treatments:  None tried Associated symptoms: swelling   Associated symptoms: no back pain, no fever and no numbness        Past Medical History:  Diagnosis Date  . Anxiety   . Asthma   . Diabetes (HCC)   . Diabetes mellitus without complication (HCC)   . Diverticulitis   . GERD (gastroesophageal reflux disease)   . HTN (hypertension)   . Hypercholesteremia   . Hyperlipidemia   .  Hypertension   . Obesity   . Rosacea   . Thyroid disease   . Ulcerative colitis [facility])     Patient Active Problem List   Diagnosis Date Noted  . HLD (hyperlipidemia) 10/15/2018  . GIB (gastrointestinal bleeding) 10/15/2018  . Colitis 10/15/2018  . Hypokalemia   . Hypothyroidism   . Acute pyelonephritis 03/22/2018  . Pyelonephritis   . UTI (urinary tract infection) 03/21/2018  . IBS (irritable bowel syndrome) 03/21/2018  . Morbid obesity (HCC) 08/08/2015  . Acute hyponatremia 05/01/2015  . Gastroenteritis, acute 05/01/2015  . Diabetes mellitus type 2, controlled (HCC) 05/01/2015  . Asthma in remission 05/01/2015  . Diarrhea   . Cough variant asthma 04/19/2015  . Acute bronchitis 03/08/2015  . Hypothyroidism, adult 02/19/2015  . Dyspnea 02/18/2015  . Upper airway cough syndrome 01/07/2015  . Chest pain 09/15/2014  . Pre-syncope 12/08/2013  . PVC (premature ventricular contraction) 08/26/2013  . Rosacea   . Anxiety   . Diverticulitis   . Diabetes (HCC)   . Essential hypertension   . Obesity     Past Surgical History:  Procedure Laterality Date  . ACHILLES TENDON REPAIR    . APPENDECTOMY    . BIOPSY  10/16/2018   Procedure: BIOPSY;  Surgeon: Kathi Der, MD;  Location: [facility] ENDOSCOPY;  Service: Gastroenterology;;  . CESAREAN SECTION    .  CHOLECYSTECTOMY    . FLEXIBLE SIGMOIDOSCOPY N/A 10/16/2018   Procedure: FLEXIBLE SIGMOIDOSCOPY;  Surgeon: Kathi Der, MD;  Location: [facility] ENDOSCOPY;  Service: Gastroenterology;  Laterality: N/A;  . hammertoe       OB History    Gravida  0   Para  0   Term  0   Preterm  0   AB  0   Living        SAB  0   TAB  0   Ectopic  0   Multiple      Live Births              Family History  Problem Relation Age of Onset  . Hypertension Mother   . Heart failure Mother   . Heart attack Mother   . Hypotension Father   . Dementia Father     Social History   Tobacco Use  . Smoking status: Never Smoker  .  Smokeless tobacco: Never Used  Vaping Use  . Vaping Use: Never used  Substance Use Topics  . Alcohol use: No    Alcohol/week: 0.0 standard drinks  . Drug use: No    Home Medications Prior to Admission medications   Medication Sig Start Date End Date Taking? Authorizing Provider  acetaminophen (TYLENOL) 500 MG tablet Take 1,000 mg by mouth 2 (two) times daily as needed (pain).    [provider]  amLODipine (NORVASC) 10 MG tablet Take 1 tablet (10 mg total) by mouth daily. Patient taking differently: Take 5 mg by mouth 2 (two) times daily.  06/10/18   Kroeger, Ovidio Kin., PA-C  aspirin 81 MG tablet Take 81 mg by mouth daily.    [provider]  budesonide (ENTOCORT EC) 3 MG 24 hr capsule Take 3 capsules (9 mg total) by mouth daily. 10/17/18   Hall, Carole N, DO  BYSTOLIC 10 MG tablet TAKE 1 TABLET (10 MG TOTAL) BY MOUTH DAILY. Patient taking differently: Take 10 mg by mouth daily.  07/15/15   Nyoka Cowden, MD  CALCIUM PO Take 500 mg by mouth at bedtime.    [provider]  hyoscyamine (LEVSIN SL) 0.125 MG SL tablet Place 0.125 mg under the tongue every 4 (four) hours as needed for cramping.     [provider]  insulin glargine (LANTUS) 100 unit/mL SOPN Inject 17 Units into the skin daily.    [provider]  levothyroxine (SYNTHROID, LEVOTHROID) 25 MCG tablet Take 25 mcg by mouth daily before breakfast.    [provider]  mesalamine (APRISO) 0.375 g 24 hr capsule Take 4 capsules by mouth daily. 08/28/18   [provider]  metFORMIN (GLUCOPHAGE-XR) 500 MG 24 hr tablet Take 3 tablets (1,500 mg total) by mouth daily. 04/05/18   Lenox Ponds, MD  pantoprazole (PROTONIX) 40 MG tablet Take 1 tablet (40 mg total) by mouth daily. Patient not taking: Reported on 01/14/2020 10/17/18   Darlin Drop, DO  Probiotic Product (ALIGN PO) Take 1 tablet by mouth daily.    [provider]  simethicone (MYLICON) 125 MG chewable  tablet Chew 125 mg by mouth every 6 (six) hours as needed for flatulence.    [provider]  simvastatin (ZOCOR) 20 MG tablet Take 1 tablet by mouth daily. 08/05/13   [provider]  tiZANidine (ZANAFLEX) 4 MG tablet Take 4 mg by mouth 3 (three) times daily as needed for muscle spasms. Patient not taking: Reported on 01/14/2020  [provider]  VITAMIN D PO Take by mouth.    [provider]    Allergies    Ace inhibitors, Ciprofloxacin, Doxycycline, and Shrimp [shellfish allergy]  Review of Systems   Review of Systems  Constitutional: Negative for diaphoresis and fever.  HENT: Negative for sore throat.   Eyes: Positive for redness (resolving). Negative for visual disturbance.  Respiratory: Positive for shortness of breath. Negative for cough and sputum production.   Cardiovascular: Negative for chest pain.  Gastrointestinal: Negative for abdominal pain.  Genitourinary: Negative for dysuria.  Musculoskeletal: Negative for back pain.  Skin: Negative for rash.  Neurological: Negative for headaches.    Physical Exam Updated Vital Signs BP (!) 179/86 (BP Location: Left Arm)   Pulse 75   Temp 98.8 F (37.1 C) (Oral)   Resp (!) 22   SpO2 99%   Physical Exam Vitals and nursing note reviewed.  Constitutional:      General: She is not in acute distress.    Appearance: She is well-developed.  HENT:     Head: Normocephalic and atraumatic.  Eyes:     Conjunctiva/sclera: Conjunctivae normal.  Cardiovascular:     Rate and Rhythm: Normal rate and regular rhythm.     Heart sounds: No murmur heard.   Pulmonary:     Effort: Pulmonary effort is normal. No respiratory distress.     Breath sounds: Normal breath sounds.  Abdominal:     Palpations: Abdomen is soft.     Tenderness: There is no abdominal tenderness.  Musculoskeletal:     Cervical back: Neck supple.     Right lower leg: Edema present.     Left lower leg: Edema present.     Comments:  She has 1+ edema over her lower extremities ankles and feet.  Intact DP PT pulses.  Does have some superficial varicosities.  Legs are normal temperature and have normal sensation to light touch proprioception.  Skin:    General: Skin is warm and dry.     Capillary Refill: Capillary refill takes less than 2 seconds.  Neurological:     General: No focal deficit present.     Mental Status: She is alert.     ED Results / Procedures / Treatments   Labs (all labs ordered are listed, but only abnormal results are displayed) Labs Reviewed  BASIC METABOLIC PANEL - Abnormal; Notable for the following components:      Result Value   Glucose, Bld 181 (*)    All other components within normal limits  BRAIN NATRIURETIC PEPTIDE - Abnormal; Notable for the following components:   B Natriuretic Peptide 283.4 (*)    All other components within normal limits  CBC WITH DIFFERENTIAL/PLATELET  PROTIME-INR    EKG EKG Interpretation  Date/Time:  Tuesday January 26 2020 10:44:20 EDT Ventricular Rate:  72 PR Interval:    QRS Duration: 80 QT Interval:  371 QTC Calculation: 406 R Axis:   41 Text Interpretation: Sinus rhythm No significant change since last tracing Confirmed by Linwood Dibbles [phone]) on 01/26/2020 10:47:03 AM   Radiology DG Chest 2 View  Result Date: 01/26/2020 CLINICAL DATA:  Shortness of breath. Bilateral leg swelling, left more than right. EXAM: CHEST - 2 VIEW COMPARISON:  Chest x-ray dated 04/03/2018 FINDINGS: The heart size and mediastinal contours are within normal limits. Both lungs are clear. The visualized skeletal structures are unremarkable. IMPRESSION: Normal exam. Electronically Signed   By: Francene Boyers M.D.   On: 01/26/2020 12:06  VAS [place] LOWER EXTREMITY VENOUS (DVT)  Result Date: 01/26/2020  Lower Venous DVTStudy Indications: Pain.  Risk Factors: None identified. Comparison Study: No prior studies. Performing Technologist: Chanda Busing RVT  Examination Guidelines: A  complete evaluation includes B-mode imaging, spectral Doppler, color Doppler, and power Doppler as needed of all accessible portions of each vessel. Bilateral testing is considered an integral part of a complete examination. Limited examinations for reoccurring indications may be performed as noted. The reflux portion of the exam is performed with the patient in reverse Trendelenburg.  +---------+---------------+---------+-----------+----------+--------------+ RIGHT    CompressibilityPhasicitySpontaneityPropertiesThrombus Aging +---------+---------------+---------+-----------+----------+--------------+ CFV      Full           Yes      Yes                                 +---------+---------------+---------+-----------+----------+--------------+ SFJ      Full                                                        +---------+---------------+---------+-----------+----------+--------------+ FV Prox  Full                                                        +---------+---------------+---------+-----------+----------+--------------+ FV Mid   Full                                                        +---------+---------------+---------+-----------+----------+--------------+ FV DistalFull                                                        +---------+---------------+---------+-----------+----------+--------------+ PFV      Full                                                        +---------+---------------+---------+-----------+----------+--------------+ POP      Full           Yes      Yes                                 +---------+---------------+---------+-----------+----------+--------------+ PTV      Full                                                        +---------+---------------+---------+-----------+----------+--------------+ PERO     Full                                                         +---------+---------------+---------+-----------+----------+--------------+  Multiphasic flow is noted in the CFA, PopA, PTA, PeroA, and ATA.  +---------+---------------+---------+-----------+----------+--------------+ LEFT     CompressibilityPhasicitySpontaneityPropertiesThrombus Aging +---------+---------------+---------+-----------+----------+--------------+ CFV      Full           Yes      Yes                                 +---------+---------------+---------+-----------+----------+--------------+ SFJ      Full                                                        +---------+---------------+---------+-----------+----------+--------------+ FV Prox  Full                                                        +---------+---------------+---------+-----------+----------+--------------+ FV Mid   Full                                                        +---------+---------------+---------+-----------+----------+--------------+ FV DistalFull                                                        +---------+---------------+---------+-----------+----------+--------------+ PFV      Full                                                        +---------+---------------+---------+-----------+----------+--------------+ POP      Full           Yes      Yes                                 +---------+---------------+---------+-----------+----------+--------------+ PTV      Full                                                        +---------+---------------+---------+-----------+----------+--------------+ PERO     Full                                                        +---------+---------------+---------+-----------+----------+--------------+ Multiphasic flow is noted in the CFA, PopA, PTA, PeroA, and ATA.    Summary: RIGHT: - There is no evidence of deep vein thrombosis in the lower extremity.  - No cystic  structure found in the popliteal fossa.   LEFT: - There is no evidence of deep vein thrombosis in the lower extremity.  - No cystic structure found in the popliteal fossa.  *See table(s) above for measurements and observations. Electronically signed by Waverly Ferrari MD on 01/26/2020 at 3:34:40 PM.    Final     Procedures Procedures (including critical care time)  Medications Ordered in ED Medications - No data to display  ED Course  I have reviewed the triage vital signs and the nursing notes.  Pertinent labs & imaging results that were available during my care of the patient were reviewed by me and considered in my medical decision making (see chart for details).  Clinical Course as of Jan 25 1718  Tue Jan 26, 2020  1103 Cardiac echo 8/18 - Study Conclusions   - Left ventricle: The cavity size was normal. Systolic function was  normal. The estimated ejection fraction was in the range of 55%  to 60%. Wall motion was normal; there were no regional wall  motion abnormalities. Features are consistent with a pseudonormal  left ventricular filling pattern, with concomitant abnormal  relaxation and increased filling pressure (grade 2 diastolic  dysfunction). Doppler parameters are consistent with high  ventricular filling pressure.  - Aortic valve: Mildly calcified annulus. Trileaflet; normal  thickness leaflets.  - Mitral valve: There was mild regurgitation.  - Left atrium: The atrium was mildly dilated. Anterior-posterior  dimension: 45 mm.    [MB]  1231 Preliminary reading on duplex is no acute DVT.   [MB]  1415 Discussed with cardiology Dr. Cristal Deer.  She did not think she needed to be admitted but would recommend close outpatient follow-up.  Patient to check daily weights.  Start Lasix 40 mg for 3 days.  Strict return instructions.   [MB]    Clinical Course User Index [MB] Terrilee Files, MD   MDM Rules/Calculators/A&P                         This patient complains of bilateral leg  swelling and pain, intermittent shortness of breath; this involves an extensive number of treatment Options and is a complaint that carries with it a high risk of complications and Morbidity. The differential includes cellulitis, DVT, PE, CHF, peripheral edema, pneumonia  I ordered, reviewed and interpreted labs, which included CBC with normal white count normal hemoglobin, chemistries normal other than elevated glucose, coags normal, elevated BNP of 283 with no priors to compare with I ordered imaging studies which included duplex bilateral lower extremities and chest x-ray and I independently    visualized and interpreted imaging which showed no acute findings Previous records obtained and reviewed in epic including last cardiology note.  Had an echo in 2018 and was treated for blood pressure management, no reported history of CHF I consulted Dr. Tomasa Hose cardiology and discussed lab and imaging findings, she recommended starting on diuretic and having close follow-up in the clinic.  Critical Interventions: None.  After the interventions stated above, I reevaluated the patient and found her to be hemodynamically stable satting well on room air.  I reviewed her lab work and recommendations from cardiology.  She is comfortable with plan.  Return instructions discussed.   Final Clinical Impression(s) / ED Diagnoses Final diagnoses:  Leg swelling  Acute congestive heart failure, unspecified heart failure type (HCC)    Rx / DC Orders ED Discharge Orders  Ordered    furosemide (LASIX) 40 MG tablet  Daily     Discontinue  Reprint     01/26/20 1440           Terrilee Files, MD 01/26/20 1722

## 2020-01-26 NOTE — Discharge Instructions (Signed)
You were seen in the emergency room for evaluation of swelling in your ankles along with some intermittent shortness of breath.  Your chest x-ray did not show any pneumonia and your blood work was mostly normal other than your heart failure number was slightly elevated.  You had an ultrasound of your legs that did not show any blood clot.  Cardiology recommended that you start on a diuretic called furosemide.  They only want you to take it once daily for 3 days.  They will contact you for follow-up appointment soon.  Return to the emergency department if any worsening or concerning symptoms.  Please start checking daily weights and record them to bring to showed your doctor.

## 2020-01-26 NOTE — ED Notes (Signed)
US at bedside

## 2020-01-26 NOTE — Progress Notes (Signed)
Bilateral lower extremity venous duplex has been completed. Preliminary results can be found in CV Proc through chart review.  Results were given to Dr. Charm Barges.  01/26/20 12:29 PM Olen Cordial RVT

## 2020-01-26 NOTE — ED Notes (Signed)
Patient transported to X-ray 

## 2020-02-01 NOTE — Progress Notes (Signed)
Cardiology Office Note   Date:  02/02/2020   ID:  Kristina [name], DOB 1946/02/23, MRN [id number]  PCP:  Shirlean Mylar, MD  Cardiologist:  Charlton Haws, MD EP: None  No chief complaint on file.     History of Present Illness: Kristina [name] is a 74 y.o. female with a PMH of labile HTN, HLD, DM type 2 on insulin, and hypothyroidism, who presents for follow-up ER visit 01/26/20 with edema and dyspnea .   I have not seen since 2018 Seen by PA 2019  She was admitted to the hospital 04/03/18-04/04/18 with complaints of chest pain where she was found to have SBP >200. It was felt hypertensive urgency was related to stress and dietary indiscretions. BP improved with IV hydralazine and patient was discharged on home regimen without adjustments.  Cardiac CT done 07/04/18 with non obstructive disease in RCA/LAD  Seen in ED 01/17/20 with edema and dyspnea. Has had some LE edema in past ? From norvasc. BNP 283 CXR NAD LE venous no DVT Last echo 8//9/18 EF 55-60% pseudo normal diastolic function mild MR Given lasix 40 mg for 3 days  K 4.2 and CR 0.77  Home BP readings ok Biggest issue is varicosities in LE;s bilaterally She is unaware of issues with ACE And Epic notes only cough Not clear to me that norvasc is a good med for her with ? Diastolic dysfunction and edema. She is taking 5 mg in am and 2.5 mg in PM   Past Medical History:  Diagnosis Date  . Anxiety   . Asthma   . Diabetes (HCC)   . Diabetes mellitus without complication (HCC)   . Diverticulitis   . GERD (gastroesophageal reflux disease)   . HTN (hypertension)   . Hypercholesteremia   . Hyperlipidemia   . Hypertension   . Obesity   . Rosacea   . Thyroid disease   . Ulcerative colitis [facility])     Past Surgical History:  Procedure Laterality Date  . ACHILLES TENDON REPAIR    . APPENDECTOMY    . BIOPSY  10/16/2018   Procedure: BIOPSY;  Surgeon: Kathi Der, MD;  Location: [facility];  Service: Gastroenterology;;  .  CESAREAN SECTION    . CHOLECYSTECTOMY    . FLEXIBLE SIGMOIDOSCOPY N/A 10/16/2018   Procedure: FLEXIBLE SIGMOIDOSCOPY;  Surgeon: Kathi Der, MD;  Location: [facility];  Service: Gastroenterology;  Laterality: N/A;  . hammertoe       Current Outpatient Medications  Medication Sig Dispense Refill  . acetaminophen (TYLENOL) 500 MG tablet Take 1,000 mg by mouth 2 (two) times daily as needed (pain).    Marland Kitchen aspirin 81 MG tablet Take 81 mg by mouth daily.    . budesonide (ENTOCORT EC) 3 MG 24 hr capsule Take 3 mg by mouth 2 (two) times daily.    Marland Kitchen BYSTOLIC 10 MG tablet TAKE 1 TABLET (10 MG TOTAL) BY MOUTH DAILY. (Patient taking differently: Take 10 mg by mouth daily. ) 30 tablet 5  . CALCIUM PO Take 500 mg by mouth at bedtime.    . furosemide (LASIX) 40 MG tablet Take 1 tablet (40 mg total) by mouth daily. Take only for 3 days. 10 tablet 0  . hyoscyamine (LEVSIN SL) 0.125 MG SL tablet Place 0.125 mg under the tongue 2 (two) times daily as needed for cramping.     . insulin glargine (LANTUS) 100 unit/mL SOPN Inject 5-17 Units into the skin See admin instructions. 17units in am and  5-6units pm    . Levothyroxine Sodium 50 MCG CAPS Take 50 mcg by mouth daily before breakfast.     . meloxicam (MOBIC) 7.5 MG tablet Take 7.5 mg by mouth daily.    . mesalamine (APRISO) 0.375 g 24 hr capsule Take 4 capsules by mouth daily.    . metFORMIN (GLUCOPHAGE-XR) 500 MG 24 hr tablet Take 3 tablets (1,500 mg total) by mouth daily.  0  . Probiotic Product (ALIGN PO) Take 1 tablet by mouth daily.    . simethicone (MYLICON) 125 MG chewable tablet Chew 125 mg by mouth every 6 (six) hours as needed for flatulence.    . simvastatin (ZOCOR) 20 MG tablet Take 1 tablet by mouth daily.    Marland Kitchen VITAMIN D PO Take 2,000 Units by mouth daily.      No current facility-administered medications for this visit.    Allergies:   Ace inhibitors, Ciprofloxacin, Doxycycline, and Shrimp [shellfish allergy]    Social History:  The  patient  reports that she has never smoked. She has never used smokeless tobacco. She reports that she does not drink alcohol and does not use drugs.   Family History:  The patient's family history includes Dementia in her father; Heart attack in her mother; Heart failure in her mother; Hypertension in her mother; Hypotension in her father.    ROS:  Please see the history of present illness.   Otherwise, review of systems are positive for none.   All other systems are reviewed and negative.    PHYSICAL EXAM: VS:  BP (!) 148/82   Pulse 67   Ht 5\' 4"  (1.626 m)   Wt 176 lb (79.8 kg)   SpO2 97%   BMI 30.21 kg/m  , BMI Body mass index is 30.21 kg/m.  Affect appropriate Healthy:  appears stated age HEENT: normal Neck supple with no adenopathy JVP normal no bruits no thyromegaly Lungs clear with no wheezing and good diaphragmatic motion Heart:  S1/S2 no murmur, no rub, gallop or click PMI normal Abdomen: benighn, BS positve, no tenderness, no AAA no bruit.  No HSM or HJR Distal pulses intact with no bruits Plus one bilateral edema Neuro non-focal Skin warm and dry No muscular weakness    EKG:   SR rate 72 normal 01/26/20    Recent Labs: 02/13/2019: ALT 13 01/26/2020: B Natriuretic Peptide 283.4; BUN 9; Creatinine, Ser 0.77; Hemoglobin 14.2; Platelets 203; Potassium 4.2; Sodium 139    Lipid Panel    Component Value Date/Time   CHOL 126 04/03/2018 1842   TRIG 159 (H) 04/03/2018 1842   HDL 41 04/03/2018 1842   CHOLHDL 3.1 04/03/2018 1842   VLDL 32 04/03/2018 1842   LDLCALC 53 04/03/2018 1842      Wt Readings from Last 3 Encounters:  02/02/20 176 lb (79.8 kg)  10/16/18 170 lb (77.1 kg)  06/10/18 172 lb 6.4 oz (78.2 kg)      Other studies Reviewed: Additional studies/ records that were reviewed today include:   NST 02/2017:  Nuclear stress EF: 51%.  There was no ST segment deviation noted during stress.  No T wave inversion was noted during stress.  The study  is normal.  This is a low risk study.   Low risk stress nuclear study with normal perfusion and low normal left ventricular global systolic function.  Echocardiogram 02/2017: Study Conclusions  - Left ventricle: The cavity size was normal. Systolic function was   normal. The estimated ejection fraction was in  the range of 55%   to 60%. Wall motion was normal; there were no regional wall   motion abnormalities. Features are consistent with a pseudonormal   left ventricular filling pattern, with concomitant abnormal   relaxation and increased filling pressure (grade 2 diastolic   dysfunction). Doppler parameters are consistent with high   ventricular filling pressure. - Aortic valve: Mildly calcified annulus. Trileaflet; normal   thickness leaflets. - Mitral valve: There was mild regurgitation. - Left atrium: The atrium was mildly dilated. Anterior-posterior   dimension: 45 mm.    ASSESSMENT AND PLAN:  1. Chest pain: patient reported exertional chest pain a/w SOB 03/2018 felt to be 2/2 hypertensive urgency.  Cardiac CT 07/04/18 calcium score 122 68 th percentile non obstructive disease in RCA/LAD < 50% proximal LAD and ostial / mid RCA  Observe   2. Labile HTN:  Cough with ACE on bystolic and norvasc  Lasix started 01/26/20 with improvement in edema. D/c norvasc start Hyzaar 50mg /12.5 mg f/u BMET 3 weeks She thinks the lasix made her IBS worse on 3rd day taking   3. Varicose Veins:  Discussed primary making referral to Vein , compression stockings, elevation and low dose non lasix diuretic D/c norvasc not ideal for patient with edema.   4. HLD: LDL 53 03/2018 - Continue statin  5. DM type 2: Last A1C 5.7 02/2018; at goal of <7 - Continue current regimen and close follow-up with PCP  6. Diastolic CHF:  ? With edema and mildly elevated BNP will update echo see change in BP Rx above BNP in 3 weeks    Echo for diastolic CHF BMET/BNP   No orders of the defined types were  placed in this encounter.    Disposition:   FU with me in 3 months   Signed, 03/2018, MD  02/02/2020 10:21 AM

## 2020-02-02 ENCOUNTER — Other Ambulatory Visit: Payer: Self-pay

## 2020-02-02 ENCOUNTER — Ambulatory Visit: Payer: Medicare PPO | Admitting: Cardiovascular Disease

## 2020-02-02 ENCOUNTER — Encounter: Payer: Self-pay | Admitting: Cardiovascular Disease

## 2020-02-02 VITALS — BP 148/82 | HR 67 | Ht 64.0 in | Wt 176.0 lb

## 2020-02-02 DIAGNOSIS — R609 Edema, unspecified: Secondary | ICD-10-CM | POA: Diagnosis not present

## 2020-02-02 DIAGNOSIS — I83813 Varicose veins of bilateral lower extremities with pain: Secondary | ICD-10-CM

## 2020-02-02 DIAGNOSIS — I5043 Acute on chronic combined systolic (congestive) and diastolic (congestive) heart failure: Secondary | ICD-10-CM

## 2020-02-02 DIAGNOSIS — I10 Essential (primary) hypertension: Secondary | ICD-10-CM | POA: Diagnosis not present

## 2020-02-02 MED ORDER — LOSARTAN POTASSIUM-HCTZ 50-12.5 MG PO TABS: 1.0000 | ORAL_TABLET | Freq: Every day | ORAL | 3 refills | Status: DC

## 2020-02-02 NOTE — Patient Instructions (Addendum)
Medication Instructions:  Your physician has recommended you make the following change in your medication:  1-STOP Norvasc 2-START Hyzaar 50/12.5 mg by mouth daily.  *If you need a refill on your cardiac medications before your next appointment, please call your pharmacy*  Lab Work: Your physician recommends that you have lab work in 2 weeks- BMET and BNP  If you have labs (blood work) drawn today and your tests are completely normal, you will receive your results only by: Marland Kitchen MyChart Message (if you have MyChart) OR . A paper copy in the mail If you have any lab test that is abnormal or we need to change your treatment, we will call you to review the results.  Testing/Procedures: Your physician has requested that you have an echocardiogram. Echocardiography is a painless test that uses sound waves to create images of your heart. It provides your doctor with information about the size and shape of your heart and how well your heart's chambers and valves are working. This procedure takes approximately one hour. There are no restrictions for this procedure.  Follow-Up: At [facility], you and your health needs are our priority.  As part of our continuing mission to provide you with exceptional heart care, we have created designated Provider Care Teams.  These Care Teams include your primary Cardiologist (physician) and Advanced Practice Providers (APPs -  Physician Assistants and Nurse Practitioners) who all work together to provide you with the care you need, when you need it.  We recommend signing up for the patient portal called "MyChart".  Sign up information is provided on this After Visit Summary.  MyChart is used to connect with patients for Virtual Visits (Telemedicine).  Patients are able to view lab/test results, encounter notes, upcoming appointments, etc.  Non-urgent messages can be sent to your provider as well.   To learn more about what you can do with MyChart, go to  [email or web address].    Your next appointment:   6 to 8 weeks  The format for your next appointment:   In Person  Provider:   You may see one of the following Advanced Practice Providers on your designated Care Team:    Norma Fredrickson, NP  Nada Boozer, NP  Georgie Chard, NP  Your physician wants you to follow-up in: 1 year with Dr. Eden Emms. You will receive a reminder letter in the mail two months in advance. If you don't receive a letter, please call our office to schedule the follow-up appointment.

## 2020-02-03 DIAGNOSIS — Z0000 Encounter for general adult medical examination without abnormal findings: Secondary | ICD-10-CM | POA: Diagnosis not present

## 2020-02-03 DIAGNOSIS — I10 Essential (primary) hypertension: Secondary | ICD-10-CM | POA: Diagnosis not present

## 2020-02-03 DIAGNOSIS — E039 Hypothyroidism, unspecified: Secondary | ICD-10-CM | POA: Diagnosis not present

## 2020-02-03 DIAGNOSIS — D500 Iron deficiency anemia secondary to blood loss (chronic): Secondary | ICD-10-CM | POA: Diagnosis not present

## 2020-02-03 DIAGNOSIS — Z794 Long term (current) use of insulin: Secondary | ICD-10-CM | POA: Diagnosis not present

## 2020-02-03 DIAGNOSIS — E785 Hyperlipidemia, unspecified: Secondary | ICD-10-CM | POA: Diagnosis not present

## 2020-02-03 DIAGNOSIS — E1169 Type 2 diabetes mellitus with other specified complication: Secondary | ICD-10-CM | POA: Diagnosis not present

## 2020-02-08 DIAGNOSIS — M76822 Posterior tibial tendinitis, left leg: Secondary | ICD-10-CM | POA: Diagnosis not present

## 2020-02-13 DIAGNOSIS — I10 Essential (primary) hypertension: Secondary | ICD-10-CM | POA: Diagnosis not present

## 2020-02-13 DIAGNOSIS — M722 Plantar fascial fibromatosis: Secondary | ICD-10-CM | POA: Diagnosis not present

## 2020-02-15 ENCOUNTER — Telehealth: Payer: Self-pay | Admitting: Nurse Practitioner

## 2020-02-15 NOTE — Telephone Encounter (Signed)
Called patient back. Patient's BP did improve after taking verapamil at 2:30 pm BP 151/76 HR 70, and at  4:00 pm BP 138/69 HR 64.  Patient continues to complain of SOB with activity. Patient stated she feels like her SOB is worse today than it has been, but gets better with rest. Ankles are still swollen and painful after stopping her amlodipine and starting on Hyzaar. Patient stated she still has chest pain on exertion and resolves with rest. Encouraged patient to go to the ED if her symptoms get worse. Will forward to Dr. Eden Emms for further advisement.

## 2020-02-15 NOTE — Telephone Encounter (Signed)
Pt c/o Shortness Of Breath: STAT if SOB developed within the last 24 hours or pt is noticeably SOB on the phone  1. Are you currently SOB (can you hear that pt is SOB on the phone)? Yes   2. How long have you been experiencing SOB? A week  3. Are you SOB when sitting or when up moving around? Both  4. Are you currently experiencing any other symptoms? Vague chest pains, fatigued, Lawanna Kobus

## 2020-02-15 NOTE — Telephone Encounter (Signed)
The patient reports on Saturday her BP went up to 210 while she was out of town. Her friend took her to Urgent Care where she was given clonidine once and a prescription for verapamil 40 mg TID PRN HTN. Since at Urgent Care visit, her BP has been 130s/70s without taking Verapamil until this AM when her BP was 189/69 before taking her meds (she takes Hyzaar in the AM and Bystolic at night).  She took her BP on the phone. It is now 174/82. She took a Verapamil 40 mg due to HTN.  While on the phone, she states since Saturday she has noticed she is more winded than usual, she has no appetite, and she has had some intermittent "mild chest tightness" in the front part of her chest which occurs on exertion and resolves with rest. She has not felt tightness like this before.  She also has some painful ankle swelling which has improved a little since stopping amlodipine and starting Hyzaar. She is limiting salt, wearing compression stockings and elevating her legs while sitting.  She currently feels "fine" resting in her rocking chair.  She understands Dr. Fabio Bering nurse will call before leaving today for a BP update and to see how she is feeling.

## 2020-02-16 ENCOUNTER — Telehealth: Payer: Self-pay | Admitting: Cardiology

## 2020-02-16 ENCOUNTER — Emergency Department (HOSPITAL_COMMUNITY): Payer: Medicare PPO

## 2020-02-16 ENCOUNTER — Encounter (HOSPITAL_COMMUNITY): Payer: Self-pay | Admitting: Pediatrics

## 2020-02-16 ENCOUNTER — Other Ambulatory Visit: Payer: Self-pay

## 2020-02-16 ENCOUNTER — Emergency Department (HOSPITAL_COMMUNITY)
Admission: EM | Admit: 2020-02-16 | Discharge: 2020-02-16 | Disposition: A | Discharge status: Home / Self Care | Payer: Medicare PPO | Attending: Emergency Medicine | Admitting: Emergency Medicine

## 2020-02-16 DIAGNOSIS — R079 Chest pain, unspecified: Secondary | ICD-10-CM | POA: Diagnosis not present

## 2020-02-16 DIAGNOSIS — Z794 Long term (current) use of insulin: Secondary | ICD-10-CM | POA: Diagnosis not present

## 2020-02-16 DIAGNOSIS — R0600 Dyspnea, unspecified: Secondary | ICD-10-CM

## 2020-02-16 DIAGNOSIS — R0789 Other chest pain: Secondary | ICD-10-CM | POA: Diagnosis not present

## 2020-02-16 DIAGNOSIS — Z7982 Long term (current) use of aspirin: Secondary | ICD-10-CM | POA: Insufficient documentation

## 2020-02-16 DIAGNOSIS — Z79890 Hormone replacement therapy (postmenopausal): Secondary | ICD-10-CM | POA: Diagnosis not present

## 2020-02-16 DIAGNOSIS — Z79899 Other long term (current) drug therapy: Secondary | ICD-10-CM | POA: Diagnosis not present

## 2020-02-16 DIAGNOSIS — E119 Type 2 diabetes mellitus without complications: Secondary | ICD-10-CM | POA: Insufficient documentation

## 2020-02-16 DIAGNOSIS — E039 Hypothyroidism, unspecified: Secondary | ICD-10-CM | POA: Diagnosis not present

## 2020-02-16 DIAGNOSIS — R609 Edema, unspecified: Secondary | ICD-10-CM | POA: Diagnosis not present

## 2020-02-16 DIAGNOSIS — R0602 Shortness of breath: Secondary | ICD-10-CM | POA: Diagnosis not present

## 2020-02-16 DIAGNOSIS — I10 Essential (primary) hypertension: Secondary | ICD-10-CM | POA: Insufficient documentation

## 2020-02-16 LAB — COMPREHENSIVE METABOLIC PANEL
ALT: 13 U/L (ref 0–44)
AST: 15 U/L (ref 15–41)
Albumin: 3.4 g/dL — ABNORMAL LOW (ref 3.5–5.0)
Alkaline Phosphatase: 59 U/L (ref 38–126)
Anion gap: 11 (ref 5–15)
BUN: 9 mg/dL (ref 8–23)
CO2: 26 mmol/L (ref 22–32)
Calcium: 9.6 mg/dL (ref 8.9–10.3)
Chloride: 93 mmol/L — ABNORMAL LOW (ref 98–111)
Creatinine, Ser: 0.86 mg/dL (ref 0.44–1.00)
GFR calc Af Amer: >60 mL/min (ref >60)
GFR calc non Af Amer: >60 mL/min (ref >60)
Glucose, Bld: 114 mg/dL — ABNORMAL HIGH (ref 70–99)
Potassium: 3.8 mmol/L (ref 3.5–5.1)
Sodium: 130 mmol/L — ABNORMAL LOW (ref 135–145)
Total Bilirubin: 0.8 mg/dL (ref 0.3–1.2)
Total Protein: 6.5 g/dL (ref 6.5–8.1)

## 2020-02-16 LAB — CBC WITH DIFFERENTIAL/PLATELET
Abs Immature Granulocytes: 0.03 10*3/uL (ref 0.00–0.07)
Basophils Absolute: 0.1 10*3/uL (ref 0.0–0.1)
Basophils Relative: 1 %
Eosinophils Absolute: 0.2 10*3/uL (ref 0.0–0.5)
Eosinophils Relative: 2 %
HCT: 37.1 % (ref 36.0–46.0)
Hemoglobin: 12.6 g/dL (ref 12.0–15.0)
Immature Granulocytes: 0 %
Lymphocytes Relative: 14 %
Lymphs Abs: 1 10*3/uL (ref 0.7–4.0)
MCH: 32.1 pg (ref 26.0–34.0)
MCHC: 34 g/dL (ref 30.0–36.0)
MCV: 94.4 fL (ref 80.0–100.0)
Monocytes Absolute: 0.6 10*3/uL (ref 0.1–1.0)
Monocytes Relative: 8 %
Neutro Abs: 5.2 10*3/uL (ref 1.7–7.7)
Neutrophils Relative %: 75 %
Platelets: 251 10*3/uL (ref 150–400)
RBC: 3.93 MIL/uL (ref 3.87–5.11)
RDW: 12.4 % (ref 11.5–15.5)
WBC: 7 10*3/uL (ref 4.0–10.5)
nRBC: 0 % (ref 0.0–0.2)

## 2020-02-16 LAB — CBG MONITORING, ED: Glucose-Capillary: 109 mg/dL — ABNORMAL HIGH (ref 70–99)

## 2020-02-16 LAB — BRAIN NATRIURETIC PEPTIDE: B Natriuretic Peptide: 241.6 pg/mL — ABNORMAL HIGH (ref 0.0–100.0)

## 2020-02-16 LAB — TROPONIN I (HIGH SENSITIVITY): Troponin I (High Sensitivity): 7 ng/L (ref <18)

## 2020-02-16 MED ORDER — FUROSEMIDE 20 MG PO TABS
20.0000 mg | ORAL_TABLET | Freq: Every day | ORAL | 0 refills | Status: DC | PRN
Start: 2020-02-16 — End: 2020-02-17

## 2020-02-16 MED ORDER — FUROSEMIDE 10 MG/ML IJ SOLN: 40.0000 mg | Freq: Once | INTRAMUSCULAR | Status: DC

## 2020-02-16 NOTE — ED Provider Notes (Signed)
[facility] EMERGENCY DEPARTMENT Provider Note   CSN: [id number] Arrival date & time: 02/16/20  0957     History Chief Complaint  Patient presents with  . Hypertension  . Shortness of Breath    Kristina [name] is a 74 y.o. female.  Patient is a 74 year old female with past medical history of diabetes, hypertension, hyperlipidemia, ulcerative colitis.  Patient recently experiencing episodes of swelling in her legs.  This was felt to be related to her amlodipine and this medication was stopped.  She was given hydrochlorothiazide and compression stockings and this seems to be improving.  DVT studies negative.  Patient states that she began feeling somewhat short of breath with pressure in her chest this morning.  She denies any nausea or diaphoresis.  She denies any radiation to the arm or jaw.    Patient has been seen by Dr. Estrella Myrtle in the cardiology office in the past and has an upcoming appointment in approximately 1 week.  The history is provided by the patient.  Shortness of Breath Severity:  Moderate Onset quality:  Gradual Duration:  1 week Timing:  Constant Chronicity:  New Context: activity   Relieved by:  Nothing Worsened by:  Nothing Ineffective treatments:  None tried      Past Medical History:  Diagnosis Date  . Anxiety   . Asthma   . Diabetes (HCC)   . Diabetes mellitus without complication (HCC)   . Diverticulitis   . GERD (gastroesophageal reflux disease)   . HTN (hypertension)   . Hypercholesteremia   . Hyperlipidemia   . Hypertension   . Obesity   . Rosacea   . Thyroid disease   . Ulcerative colitis [facility])     Patient Active Problem List   Diagnosis Date Noted  . HLD (hyperlipidemia) 10/15/2018  . GIB (gastrointestinal bleeding) 10/15/2018  . Colitis 10/15/2018  . Hypokalemia   . Hypothyroidism   . Acute pyelonephritis 03/22/2018  . Pyelonephritis   . UTI (urinary tract infection) 03/21/2018  . IBS (irritable bowel  syndrome) 03/21/2018  . Morbid obesity (HCC) 08/08/2015  . Acute hyponatremia 05/01/2015  . Gastroenteritis, acute 05/01/2015  . Diabetes mellitus type 2, controlled (HCC) 05/01/2015  . Asthma in remission 05/01/2015  . Diarrhea   . Cough variant asthma 04/19/2015  . Acute bronchitis 03/08/2015  . Hypothyroidism, adult 02/19/2015  . Dyspnea 02/18/2015  . Upper airway cough syndrome 01/07/2015  . Chest pain 09/15/2014  . Pre-syncope 12/08/2013  . PVC (premature ventricular contraction) 08/26/2013  . Rosacea   . Anxiety   . Diverticulitis   . Diabetes (HCC)   . Essential hypertension   . Obesity     Past Surgical History:  Procedure Laterality Date  . ACHILLES TENDON REPAIR    . APPENDECTOMY    . BIOPSY  10/16/2018   Procedure: BIOPSY;  Surgeon: Kathi Der, MD;  Location: [facility];  Service: Gastroenterology;;  . CESAREAN SECTION    . CHOLECYSTECTOMY    . FLEXIBLE SIGMOIDOSCOPY N/A 10/16/2018   Procedure: FLEXIBLE SIGMOIDOSCOPY;  Surgeon: Kathi Der, MD;  Location: [facility];  Service: Gastroenterology;  Laterality: N/A;  . hammertoe       OB History    Gravida  0   Para  0   Term  0   Preterm  0   AB  0   Living        SAB  0   TAB  0   Ectopic  0   Multiple  Live Births              Family History  Problem Relation Age of Onset  . Hypertension Mother   . Heart failure Mother   . Heart attack Mother   . Hypotension Father   . Dementia Father     Social History   Tobacco Use  . Smoking status: Never Smoker  . Smokeless tobacco: Never Used  Vaping Use  . Vaping Use: Never used  Substance Use Topics  . Alcohol use: No    Alcohol/week: 0.0 standard drinks  . Drug use: No    Home Medications Prior to Admission medications   Medication Sig Start Date End Date Taking? Authorizing Provider  acetaminophen (TYLENOL) 500 MG tablet Take 1,000 mg by mouth 2 (two) times daily as needed (pain).    [provider]  aspirin 81 MG tablet Take 81 mg by mouth daily.    [provider]  budesonide (ENTOCORT EC) 3 MG 24 hr capsule Take 3 mg by mouth 2 (two) times daily.    [provider]  BYSTOLIC 10 MG tablet TAKE 1 TABLET (10 MG TOTAL) BY MOUTH DAILY. Patient taking differently: Take 10 mg by mouth daily.  07/15/15   Nyoka Cowden, MD  CALCIUM PO Take 500 mg by mouth at bedtime.    [provider]  furosemide (LASIX) 40 MG tablet Take 1 tablet (40 mg total) by mouth daily. Take only for 3 days. 01/26/20   Terrilee Files, MD  hyoscyamine (LEVSIN SL) 0.125 MG SL tablet Place 0.125 mg under the tongue 2 (two) times daily as needed for cramping.     [provider]  insulin glargine (LANTUS) 100 unit/mL SOPN Inject 5-17 Units into the skin See admin instructions. 17units in am and 5-6units pm    [provider]  Levothyroxine Sodium 50 MCG CAPS Take 50 mcg by mouth daily before breakfast.     [provider]  losartan-hydrochlorothiazide (HYZAAR) 50-12.5 MG tablet Take 1 tablet by mouth daily. 02/02/20   [name], MD  meloxicam (MOBIC) 7.5 MG tablet Take 7.5 mg by mouth daily. 01/20/20   [provider]  mesalamine (APRISO) 0.375 g 24 hr capsule Take 4 capsules by mouth daily. 08/28/18   [provider]  metFORMIN (GLUCOPHAGE-XR) 500 MG 24 hr tablet Take 3 tablets (1,500 mg total) by mouth daily. 04/05/18   Lenox Ponds, MD  Probiotic Product (ALIGN PO) Take 1 tablet by mouth daily.    [provider]  simethicone (MYLICON) 125 MG chewable tablet Chew 125 mg by mouth every 6 (six) hours as needed for flatulence.    [provider]  simvastatin (ZOCOR) 20 MG tablet Take 1 tablet by mouth daily. 08/05/13   [provider]  VITAMIN D PO Take 2,000 Units by mouth daily.     [provider]    Allergies    Ace inhibitors, Ciprofloxacin, Doxycycline, and Shrimp [shellfish allergy]  Review of  Systems   Review of Systems  Respiratory: Positive for shortness of breath.   All other systems reviewed and are negative.   Physical Exam Updated Vital Signs BP (!) 201/81 (BP Location: Left Arm)   Pulse 72   Temp 98.6 F (37 C) (Oral)   Ht 5\' 4"  (1.626 m)   Wt 75.3 kg   SpO2 99%   BMI 28.49 kg/m   Physical Exam Vitals and nursing note reviewed.  Constitutional:  General: She is not in acute distress.    Appearance: She is well-developed. She is not diaphoretic.  HENT:     Head: Normocephalic and atraumatic.  Cardiovascular:     Rate and Rhythm: Normal rate and regular rhythm.     Heart sounds: No murmur heard.  No friction rub. No gallop.   Pulmonary:     Effort: Pulmonary effort is normal. No respiratory distress.     Breath sounds: Normal breath sounds. No wheezing.  Abdominal:     General: Bowel sounds are normal. There is no distension.     Palpations: Abdomen is soft.     Tenderness: There is no abdominal tenderness.  Musculoskeletal:        General: Normal range of motion.     Cervical back: Normal range of motion and neck supple.     Right lower leg: No tenderness. No edema.     Left lower leg: No tenderness. No edema.  Skin:    General: Skin is warm and dry.  Neurological:     Mental Status: She is alert and oriented to person, place, and time.     ED Results / Procedures / Treatments   Labs (all labs ordered are listed, but only abnormal results are displayed) Labs Reviewed  CBG MONITORING, ED - Abnormal; Notable for the following components:      Result Value   Glucose-Capillary 109 (*)    All other components within normal limits  CBC WITH DIFFERENTIAL/PLATELET  COMPREHENSIVE METABOLIC PANEL  BRAIN NATRIURETIC PEPTIDE  TROPONIN I (HIGH SENSITIVITY)    EKG EKG Interpretation  Date/Time:  Tuesday February 16 2020 10:04:40 EDT Ventricular Rate:  72 PR Interval:    QRS Duration: 80 QT Interval:  384 QTC Calculation: 421 R  Axis:   60 Text Interpretation: Sinus rhythm Normal ECG Confirmed by Geoffery Lyons ([id number]) on 02/16/2020 10:26:08 AM   Radiology No results found.  Procedures Procedures (including critical care time)  Medications Ordered in ED Medications - No data to display  ED Course  I have reviewed the triage vital signs and the nursing notes.  Pertinent labs & imaging results that were available during my care of the patient were reviewed by me and considered in my medical decision making (see chart for details).    MDM Rules/Calculators/A&P  Patient is a 73 year old female presenting with complaints of shortness of breath with exertion and elevated blood pressure.  This has been ongoing for the past several days.  The etiology of her symptoms seems to be fluid retention.  She does have a mildly elevated BNP and symptoms she is describing is consistent with CHF.  Patient does have an upcoming cardiologist appointment in the next week and I believe discharge is appropriate.  There is no hypoxia, tachycardia, or tachypnea.  Patient was given Lasix with good diuresis.  She will be discharged with a low dose of Lasix and follow-up with cardiology.  Final Clinical Impression(s) / ED Diagnoses Final diagnoses:  None    Rx / DC Orders ED Discharge Orders    None       Geoffery Lyons, MD 02/16/20 1241

## 2020-02-16 NOTE — Discharge Instructions (Signed)
Begin taking Lasix as prescribed.  Follow-up with your cardiologist in the next few days, and return to the ER if you develop chest pain, worsening breathing, or other new and concerning symptoms.

## 2020-02-16 NOTE — Telephone Encounter (Signed)
She has had non obstructive disease by CT 07/2017 not sure all of her issues are cardiac related Can f/u in office and consider right and left cath

## 2020-02-16 NOTE — Telephone Encounter (Signed)
Called patient this morning to offer her an appointment with PA. Patient stated she had worsening of SOB and called EMS and they are with her right now. Informed patient of Dr. Fabio Bering recommendations. Patient will call back if she needs to make an appointment.

## 2020-02-16 NOTE — ED Triage Notes (Signed)
Patient arrived from home c/o shortness of breathe on exertion along w/ left sided chest pain and high blood pressure. Pt endorsed has had vague symptoms for weeks and worst the last 3-4 days. Patient endorsed decreased appetite as well as feeling queasy.

## 2020-02-17 ENCOUNTER — Telehealth: Payer: Self-pay | Admitting: Cardiovascular Disease

## 2020-02-17 MED ORDER — FUROSEMIDE 20 MG PO TABS
20.0000 mg | ORAL_TABLET | Freq: Every day | ORAL | 2 refills | Status: DC
Start: 2020-02-17 — End: 2020-10-27

## 2020-02-17 MED ORDER — LOSARTAN POTASSIUM 100 MG PO TABS
100.0000 mg | ORAL_TABLET | Freq: Every day | ORAL | 2 refills | Status: DC
Start: 2020-02-17 — End: 2020-10-27

## 2020-02-17 NOTE — Telephone Encounter (Signed)
Would take lasix 20 mg daily D/c losartan-HCTZ and start losartan 100 mg daily without the HCTZ  Should f/u with her primary for elevated BP and consider adding hydralazine 50 mg bid if BP remains elevated  Her CXR was clear in ER and BNP only mildly elevated

## 2020-02-17 NOTE — Telephone Encounter (Signed)
Patient notified.  Will send prescription for losartan and lasix to [facility] on [address].  Pt will have echo and lab work on 7/29 and follow up with Norma Fredrickson, NP on 8/24 as planned   Patient reports she has been having trouble recalling words and thoughts recently.  No trouble with speech, no one sided weakness, no facial droop.  I asked her to follow up with PCP regarding this issue.

## 2020-02-17 NOTE — Telephone Encounter (Signed)
Patient was in the ED yesterday and she would like to discuss with [name]. She states she also has some questions in regards to medication but would not go into further detail. She is aware [name] is off today and will be back tomorrow.

## 2020-02-17 NOTE — Telephone Encounter (Signed)
I spoke with patient. She was seen in ED yesterday for weakness and shortness of breath and wanted to make Dr Eden Emms aware.  She was discharged on prn lasix but patient reports she was not instructed how to take.  It is currently listed as 20 mg prn and discharge instructions state begin taking lasix as prescribed. She is asking if she should continue Losartan-HCTZ. Also asking if she should keep scheduled appointments for echo and lab work on 7/29 or if she needs office visit prior to these tests.  Today she is feeling back to normal and not having any shortness of breath. I told patient to continue scheduled medications and if she develops shortness of breath she could take lasix today.   Will forward to Dr Eden Emms regarding timing of appointments and to see if prn lasix should be continued.

## 2020-02-19 ENCOUNTER — Telehealth: Payer: Self-pay | Admitting: Cardiovascular Disease

## 2020-02-19 NOTE — Telephone Encounter (Signed)
Kristina [name] is calling requesting to speak with [name] in regards to Lasix and another medication that she believes is causing her diarrhea and stomach issues. She did not go into any further detail in regards to it to have any further documentation. Please advise.

## 2020-02-19 NOTE — Telephone Encounter (Signed)
Called patient back. Patient had a few questions about lasix. Answered her questions. Patient will call back if she has any more questions.

## 2020-02-25 ENCOUNTER — Other Ambulatory Visit: Payer: Self-pay

## 2020-02-25 ENCOUNTER — Telehealth: Payer: Self-pay

## 2020-02-25 ENCOUNTER — Ambulatory Visit (HOSPITAL_COMMUNITY): Payer: Medicare PPO | Attending: Cardiovascular Disease

## 2020-02-25 ENCOUNTER — Other Ambulatory Visit: Payer: Medicare PPO | Admitting: *Deleted

## 2020-02-25 DIAGNOSIS — I5043 Acute on chronic combined systolic (congestive) and diastolic (congestive) heart failure: Secondary | ICD-10-CM

## 2020-02-25 LAB — ECHOCARDIOGRAM COMPLETE
Area-P 1/2: 3.07 cm2
S' Lateral: 3.1 cm

## 2020-02-25 NOTE — Telephone Encounter (Signed)
Called patient with results. Per Dr. Eden Emms, EF normal just mild MR overall good. Patient is wondering why she is still having the following symptoms: SOB with activity, fatigue, brain fog that comes and goes, and chest heaviness that comes and goes. Patient stated she did have some swelling in her BLE but the lasix has helped. Patient stated her symptoms have improved over the last two days. Will forward to Dr. Eden Emms.

## 2020-02-25 NOTE — Telephone Encounter (Signed)
She has other health issues like varicose veins, HTN, DM has had cardiac CT And stress tests that are ok Would f/u with primary regarding her overall health and other symptoms We have made changes to her BP meds

## 2020-02-25 NOTE — Telephone Encounter (Signed)
Called patient back with Dr. Fabio Bering recommendations. Patient will call her PCP.

## 2020-02-26 ENCOUNTER — Ambulatory Visit: Payer: Medicare Other | Admitting: Cardiovascular Disease

## 2020-02-26 LAB — BASIC METABOLIC PANEL
BUN/Creatinine Ratio: 10 — ABNORMAL LOW (ref 12–28)
BUN: 8 mg/dL (ref 8–27)
CO2: 24 mmol/L (ref 20–29)
Calcium: 9.8 mg/dL (ref 8.7–10.3)
Chloride: 95 mmol/L — ABNORMAL LOW (ref 96–106)
Creatinine, Ser: 0.84 mg/dL (ref 0.57–1.00)
GFR calc Af Amer: 79 mL/min/{1.73_m2} (ref >59)
GFR calc non Af Amer: 68 mL/min/{1.73_m2} (ref >59)
Glucose: 153 mg/dL — ABNORMAL HIGH (ref 65–99)
Potassium: 4.2 mmol/L (ref 3.5–5.2)
Sodium: 135 mmol/L (ref 134–144)

## 2020-02-26 LAB — PRO B NATRIURETIC PEPTIDE: NT-Pro BNP: 1261 pg/mL — ABNORMAL HIGH (ref 0–738)

## 2020-03-01 DIAGNOSIS — I5189 Other ill-defined heart diseases: Secondary | ICD-10-CM | POA: Diagnosis not present

## 2020-03-14 NOTE — Progress Notes (Addendum)
CARDIOLOGY OFFICE NOTE  Date:  03/22/2020    Kristina [name] Date of Birth: [birth year] Medical Record #[id number]  PCP:  Shirlean Mylar, MD  Cardiologist:  Eden Emms   Chief Complaint  Patient presents with  . Follow-up    Seen for Dr. Eden Emms    History of Present Illness: Kristina [name] is a 74 y.o. female who presents today for a follow up visit. Seen for Dr. Eden Emms.   She has a history of labile HTN, HLD, DM type 2 on insulin, and hypothyroidism. Cardiac CT done 07/04/18 with non obstructive disease in RCA/LAD.   She was last seen by Dr. Eden Emms last month following an ER visit for edema and SOB. Has had swelling with Norvasc. He stopped this. Changed over to Hyzaar.   Comes in today. Here alone.  She thinks she is better. BP has done better over the last week - in the 120 to 130's. She notes she is now constipated - wonders if this is from her medicines. She used some laxative with good results. She has talked with GI. She is now diabetic, has UC/colitis and thinks she has CHF. She is trying to limit her salt better. She thinks her colitis makes her weight change. She has lots of questions about masking/staying home. Overall seems to be feeling better. Not as tired. No chest pain. Breathing is fine. No swelling.    Past Medical History:  Diagnosis Date  . Anxiety   . Asthma   . Diabetes (HCC)   . Diabetes mellitus without complication (HCC)   . Diverticulitis   . GERD (gastroesophageal reflux disease)   . HTN (hypertension)   . Hypercholesteremia   . Hyperlipidemia   . Hypertension   . Obesity   . Rosacea   . Thyroid disease   . Ulcerative colitis [facility])     Past Surgical History:  Procedure Laterality Date  . ACHILLES TENDON REPAIR    . APPENDECTOMY    . BIOPSY  10/16/2018   Procedure: BIOPSY;  Surgeon: Kathi Der, MD;  Location: [facility];  Service: Gastroenterology;;  . CESAREAN SECTION    . CHOLECYSTECTOMY    . FLEXIBLE SIGMOIDOSCOPY N/A 10/16/2018    Procedure: FLEXIBLE SIGMOIDOSCOPY;  Surgeon: Kathi Der, MD;  Location: [facility];  Service: Gastroenterology;  Laterality: N/A;  . hammertoe       Medications: Current Meds  Medication Sig  . acetaminophen (TYLENOL) 500 MG tablet Take 1,000 mg by mouth 2 (two) times daily as needed (pain).  Marland Kitchen aspirin 81 MG tablet Take 81 mg by mouth daily.  . budesonide (ENTOCORT EC) 3 MG 24 hr capsule Take 3 mg by mouth 2 (two) times daily.  Marland Kitchen BYSTOLIC 10 MG tablet TAKE 1 TABLET (10 MG TOTAL) BY MOUTH DAILY.  Marland Kitchen CALCIUM PO Take 500 mg by mouth at bedtime.  . furosemide (LASIX) 20 MG tablet Take 1 tablet (20 mg total) by mouth daily.  . hyoscyamine (LEVSIN) 0.125 MG tablet Take 0.125 mg by mouth 2 (two) times daily as needed for cramping.  . insulin glargine (LANTUS) 100 unit/mL SOPN Inject 15 Units into the skin at bedtime.   . Levothyroxine Sodium 50 MCG CAPS Take 50 mcg by mouth daily before breakfast.   . losartan (COZAAR) 100 MG tablet Take 1 tablet (100 mg total) by mouth daily.  . mesalamine (APRISO) 0.375 g 24 hr capsule Take 4 capsules by mouth daily.  . metFORMIN (GLUCOPHAGE-XR) 500 MG 24 hr tablet Take  3 tablets (1,500 mg total) by mouth daily.  . Probiotic Product (ALIGN PO) Take 1 tablet by mouth daily.  . simethicone (MYLICON) 125 MG chewable tablet Chew 125 mg by mouth every 6 (six) hours as needed for flatulence.  . simvastatin (ZOCOR) 20 MG tablet Take 20 mg by mouth every evening.   Marland Kitchen VITAMIN D PO Take 2,000 Units by mouth daily.      Allergies: Allergies  Allergen Reactions  . Ace Inhibitors Cough  . Ciprofloxacin Other (See Comments)    Tendinitis in heel and posterior right LE Tendon rupture  . Doxycycline Other (See Comments)    Pt does not remember what the reaction was - many years ago  . Shrimp [Shellfish Allergy] Diarrhea, Nausea And Vomiting and Rash    Social History: The patient  reports that she has never smoked. She has never used smokeless tobacco.  She reports that she does not drink alcohol and does not use drugs.   Family History: The patient's family history includes Dementia in her father; Heart attack in her mother; Heart failure in her mother; Hypertension in her mother; Hypotension in her father.   Review of Systems: Please see the history of present illness.   All other systems are reviewed and negative.   Physical Exam: VS:  BP 140/82   Pulse 68   Ht 5\' 4"  (1.626 m)   Wt 164 lb (74.4 kg)   SpO2 97%   BMI 28.15 kg/m  .  BMI Body mass index is 28.15 kg/m.  Wt Readings from Last 3 Encounters:  03/22/20 164 lb (74.4 kg)  02/16/20 166 lb (75.3 kg)  02/02/20 176 lb (79.8 kg)    General: Alert and in no acute distress.   Cardiac: Regular rate and rhythm. No murmurs, rubs, or gallops. No edema.  Respiratory:  Lungs are clear to auscultation bilaterally with normal work of breathing.  GI: Soft and nontender.  MS: No deformity or atrophy. Gait and ROM intact.  Skin: Warm and dry. Color is normal.  Neuro:  Strength and sensation are intact and no gross focal deficits noted.  Psych: Alert, appropriate and with normal affect.   LABORATORY DATA:  EKG:  EKG is not ordered today.    Lab Results  Component Value Date   WBC 7.0 02/16/2020   HGB 12.6 02/16/2020   HCT 37.1 02/16/2020   PLT 251 02/16/2020   GLUCOSE 153 (H) 02/25/2020   CHOL 126 04/03/2018   TRIG 159 (H) 04/03/2018   HDL 41 04/03/2018   LDLCALC 53 04/03/2018   ALT 13 02/16/2020   AST 15 02/16/2020   NA 135 02/25/2020   K 4.2 02/25/2020   CL 95 (L) 02/25/2020   CREATININE 0.84 02/25/2020   BUN 8 02/25/2020   CO2 24 02/25/2020   TSH 4.745 (H) 04/03/2018   INR 0.9 01/26/2020   HGBA1C 7.1 (H) 10/16/2018     BNP (last 3 results) Recent Labs    01/26/20 1220 02/16/20 1015  BNP 283.4* 241.6*    ProBNP (last 3 results) Recent Labs    02/25/20 0859  PROBNP 1,261*     Other Studies Reviewed Today:  ECHO IMPRESSIONS 01/2020  1. Mid and  basal inferior wall hypokinesis Normal GLS -15. Left  ventricular ejection fraction, by estimation, is 55%. The left ventricle  has normal function. The left ventricle has no regional wall motion  abnormalities. Left ventricular diastolic  parameters are consistent with Grade I diastolic dysfunction (impaired  relaxation).  2. Right ventricular systolic function is normal. The right ventricular  size is normal. There is normal pulmonary artery systolic pressure.  3. Left atrial size was moderately dilated.  4. The mitral valve is normal in structure. Mild mitral valve  regurgitation. No evidence of mitral stenosis.  5. The aortic valve is tricuspid. Aortic valve regurgitation is not  visualized. Mild aortic valve sclerosis is present, with no evidence of  aortic valve stenosis.  6. The inferior vena cava is normal in size with greater than 50%  respiratory variability, suggesting right atrial pressure of 3 mmHg.    CORONARY CT IMPRESSION 06/2018: 1. Calcium score 122 which is 68 th percentile for age and sex  2.  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  Normal aortic root size 3.1 cm  Charlton Haws   ASSESSMENT AND PLAN:  1. HTN - has not tolerated Norvasc due to swelling. No longer on HCTZ. Now on Losartan and Lasix - has better and at goal control with BP at home. Recheck BMET today. Salt restriction to continue.   2. 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   3. HLD - on statin - last lipids noted.   4. DM - per PCP  5. Diastolic dysfunction - explained that this is managed with salt restriction and BP control - would limit use of NSAID which she is doing. Has normal EF.   6. Varicose veins.   7. Constipation - she has talked with GI - seems to be improving. Also with underlying colitis as well.   8. COVID 19 - would continue efforts at masking/staying at home given recent surge.   Current medicines are reviewed with the  patient today.  The patient does not have concerns regarding medicines other than what has been noted above.  The following changes have been made:  See above.  Labs/ tests ordered today include:    Orders Placed This Encounter  Procedures  . Basic metabolic panel     Disposition:   FU with Dr. Eden Emms in about 4 months. BMET today. Continue with current regimen.    Patient is agreeable to this plan and will call if any problems develop in the interim.   SignedNorma Fredrickson, NP  03/22/2020 8:47 AM  [facility] [address] [place], [place]  [address] Phone: [phone] Fax: [phone]

## 2020-03-22 ENCOUNTER — Other Ambulatory Visit: Payer: Self-pay

## 2020-03-22 ENCOUNTER — Encounter: Payer: Self-pay | Admitting: Nurse Practitioner

## 2020-03-22 ENCOUNTER — Ambulatory Visit (INDEPENDENT_AMBULATORY_CARE_PROVIDER_SITE_OTHER): Payer: Medicare PPO | Admitting: Nurse Practitioner

## 2020-03-22 VITALS — BP 140/82 | HR 68 | Ht 64.0 in | Wt 164.0 lb

## 2020-03-22 DIAGNOSIS — I10 Essential (primary) hypertension: Secondary | ICD-10-CM

## 2020-03-22 LAB — BASIC METABOLIC PANEL
BUN/Creatinine Ratio: 15 (ref 12–28)
BUN: 13 mg/dL (ref 8–27)
CO2: 24 mmol/L (ref 20–29)
Calcium: 9.8 mg/dL (ref 8.7–10.3)
Chloride: 101 mmol/L (ref 96–106)
Creatinine, Ser: 0.86 mg/dL (ref 0.57–1.00)
GFR calc Af Amer: 76 mL/min/{1.73_m2} (ref >59)
GFR calc non Af Amer: 66 mL/min/{1.73_m2} (ref >59)
Glucose: 112 mg/dL — ABNORMAL HIGH (ref 65–99)
Potassium: 4.5 mmol/L (ref 3.5–5.2)
Sodium: 141 mmol/L (ref 134–144)

## 2020-03-22 NOTE — Patient Instructions (Addendum)
After Visit Summary:  We will be checking the following labs today - BMET   Medication Instructions:    Continue with your current medicines.    If you need a refill on your cardiac medications before your next appointment, please call your pharmacy.     Testing/Procedures To Be Arranged:  N/A  Follow-Up:   See Dr. Nishan in about 4 months    At [facility], you and your health needs are our priority.  As part of our continuing mission to provide you with exceptional heart care, we have created designated Provider Care Teams.  These Care Teams include your primary Cardiologist (physician) and Advanced Practice Providers (APPs -  Physician Assistants and Nurse Practitioners) who all work together to provide you with the care you need, when you need it.  Special Instructions:  . Stay safe, wash your hands for at least 20 seconds and wear a mask when needed.  . It was good to talk with you today.    Call the [facility] office at [phone] if you have any questions, problems or concerns.       

## 2020-03-29 DIAGNOSIS — M1712 Unilateral primary osteoarthritis, left knee: Secondary | ICD-10-CM | POA: Diagnosis not present

## 2020-03-30 DIAGNOSIS — K5900 Constipation, unspecified: Secondary | ICD-10-CM | POA: Diagnosis not present

## 2020-03-30 DIAGNOSIS — K529 Noninfective gastroenteritis and colitis, unspecified: Secondary | ICD-10-CM | POA: Diagnosis not present

## 2020-03-30 DIAGNOSIS — K921 Melena: Secondary | ICD-10-CM | POA: Diagnosis not present

## 2020-05-17 DIAGNOSIS — M545 Low back pain, unspecified: Secondary | ICD-10-CM | POA: Diagnosis not present

## 2020-05-26 ENCOUNTER — Ambulatory Visit
Admission: RE | Admit: 2020-05-26 | Discharge: 2020-05-26 | Disposition: A | Discharge status: Home / Self Care | Payer: Medicare PPO | Source: Ambulatory Visit | Attending: Family Medicine | Admitting: Family Medicine

## 2020-05-26 ENCOUNTER — Other Ambulatory Visit: Payer: Self-pay | Admitting: Family Medicine

## 2020-05-26 DIAGNOSIS — M545 Low back pain, unspecified: Secondary | ICD-10-CM

## 2020-05-26 DIAGNOSIS — K529 Noninfective gastroenteritis and colitis, unspecified: Secondary | ICD-10-CM | POA: Diagnosis not present

## 2020-05-26 DIAGNOSIS — K5900 Constipation, unspecified: Secondary | ICD-10-CM | POA: Diagnosis not present

## 2020-05-28 ENCOUNTER — Other Ambulatory Visit: Payer: Self-pay

## 2020-05-28 ENCOUNTER — Ambulatory Visit
Admission: EM | Admit: 2020-05-28 | Discharge: 2020-05-28 | Disposition: A | Discharge status: Home / Self Care | Payer: Medicare PPO | Attending: Physician Assistant | Admitting: Physician Assistant

## 2020-05-28 DIAGNOSIS — R42 Dizziness and giddiness: Secondary | ICD-10-CM | POA: Diagnosis not present

## 2020-05-28 DIAGNOSIS — T148XXA Other injury of unspecified body region, initial encounter: Secondary | ICD-10-CM

## 2020-05-28 DIAGNOSIS — W19XXXA Unspecified fall, initial encounter: Secondary | ICD-10-CM

## 2020-05-28 NOTE — ED Provider Notes (Signed)
[facility]    CSN: [id number] Arrival date & time: 05/28/20  5621      History   Chief Complaint Chief Complaint  Patient presents with  . Dizziness    x 2 hours    HPI Kristina [name] is a 74 y.o. female.   74 year old female comes in for evaluation after fall this morning. She had a BM earlier today, and felt dizzy after and fell. Denies head injury, loss of consciousness. She was seen by EMS and found to be orthostatic. She was given of IV fluids. States dizziness has since resolved. Denies chest pain, shortness of breath. EMS workup reveals: CBG 158, BP 176/80, EKG NSR, 69bpm, no ST changes. Patient states with history of UC, was seen by provider 2 days ago with negative hemoccult. She did note BRB after wiping with BM. Denies abdominal pain, nausea, vomiting. Denies fever. Denies rectal pain. Also with skin abrasions.      Past Medical History:  Diagnosis Date  . Anxiety   . Asthma   . Diabetes (HCC)   . Diabetes mellitus without complication (HCC)   . Diverticulitis   . GERD (gastroesophageal reflux disease)   . HTN (hypertension)   . Hypercholesteremia   . Hyperlipidemia   . Hypertension   . Obesity   . Rosacea   . Thyroid disease   . Ulcerative colitis [facility])     Patient Active Problem List   Diagnosis Date Noted  . HLD (hyperlipidemia) 10/15/2018  . GIB (gastrointestinal bleeding) 10/15/2018  . Colitis 10/15/2018  . Hypokalemia   . Hypothyroidism   . Acute pyelonephritis 03/22/2018  . Pyelonephritis   . UTI (urinary tract infection) 03/21/2018  . IBS (irritable bowel syndrome) 03/21/2018  . Morbid obesity (HCC) 08/08/2015  . Acute hyponatremia 05/01/2015  . Gastroenteritis, acute 05/01/2015  . Diabetes mellitus type 2, controlled (HCC) 05/01/2015  . Asthma in remission 05/01/2015  . Diarrhea   . Cough variant asthma 04/19/2015  . Acute bronchitis 03/08/2015  . Hypothyroidism, adult 02/19/2015  . Dyspnea 02/18/2015  . Upper  airway cough syndrome 01/07/2015  . Chest pain 09/15/2014  . Pre-syncope 12/08/2013  . PVC (premature ventricular contraction) 08/26/2013  . Rosacea   . Anxiety   . Diverticulitis   . Diabetes (HCC)   . Essential hypertension   . Obesity     Past Surgical History:  Procedure Laterality Date  . ACHILLES TENDON REPAIR    . APPENDECTOMY    . BIOPSY  10/16/2018   Procedure: BIOPSY;  Surgeon: Kathi Der, MD;  Location: [facility];  Service: Gastroenterology;;  . CESAREAN SECTION    . CHOLECYSTECTOMY    . FLEXIBLE SIGMOIDOSCOPY N/A 10/16/2018   Procedure: FLEXIBLE SIGMOIDOSCOPY;  Surgeon: Kathi Der, MD;  Location: [facility];  Service: Gastroenterology;  Laterality: N/A;  . hammertoe      OB History    Gravida  0   Para  0   Term  0   Preterm  0   AB  0   Living        SAB  0   TAB  0   Ectopic  0   Multiple      Live Births               Home Medications    Prior to Admission medications   Medication Sig Start Date End Date Taking? Authorizing Provider  aspirin 81 MG tablet Take 81 mg by mouth daily.  Yes [provider]  budesonide (ENTOCORT EC) 3 MG 24 hr capsule Take 3 mg by mouth 2 (two) times daily.   Yes [provider]  CALCIUM PO Take 500 mg by mouth at bedtime.   Yes [provider]  furosemide (LASIX) 20 MG tablet Take 1 tablet (20 mg total) by mouth daily. 02/17/20  Yes [name], MD  insulin glargine (LANTUS) 100 unit/mL SOPN Inject 15 Units into the skin at bedtime.    Yes [provider]  Levothyroxine Sodium 50 MCG CAPS Take 50 mcg by mouth daily before breakfast.    Yes [provider]  losartan (COZAAR) 100 MG tablet Take 1 tablet (100 mg total) by mouth daily. 02/17/20  Yes [name], MD  metFORMIN (GLUCOPHAGE-XR) 500 MG 24 hr tablet Take 3 tablets (1,500 mg total) by mouth daily. 04/05/18  Yes Randel Pigg, [name], MD  Probiotic Product (ALIGN PO) Take 1 tablet by  mouth daily.   Yes [provider]  VITAMIN D PO Take 2,000 Units by mouth daily.    Yes [provider]  acetaminophen (TYLENOL) 500 MG tablet Take 1,000 mg by mouth 2 (two) times daily as needed (pain).    [provider]  BYSTOLIC 10 MG tablet TAKE 1 TABLET (10 MG TOTAL) BY MOUTH DAILY. 07/15/15   Nyoka Cowden, MD  hyoscyamine (LEVSIN) 0.125 MG tablet Take 0.125 mg by mouth 2 (two) times daily as needed for cramping. 01/04/20   [provider]  mesalamine (APRISO) 0.375 g 24 hr capsule Take 4 capsules by mouth daily. 08/28/18   [provider]  simethicone (MYLICON) 125 MG chewable tablet Chew 125 mg by mouth every 6 (six) hours as needed for flatulence.    [provider]  simvastatin (ZOCOR) 20 MG tablet Take 20 mg by mouth every evening.  08/05/13   [provider]    Family History Family History  Problem Relation Age of Onset  . Hypertension Mother   . Heart failure Mother   . Heart attack Mother   . Hypotension Father   . Dementia Father     Social History Social History   Tobacco Use  . Smoking status: Never Smoker  . Smokeless tobacco: Never Used  Vaping Use  . Vaping Use: Never used  Substance Use Topics  . Alcohol use: No    Alcohol/week: 0.0 standard drinks  . Drug use: No     Allergies   Ace inhibitors, Ciprofloxacin, Doxycycline, and Shrimp [shellfish allergy]   Review of Systems Review of Systems  Reason unable to perform ROS: See HPI as above.     Physical Exam Triage Vital Signs ED Triage Vitals  Enc Vitals Group     BP 05/28/20 0915 (!) 172/76     Pulse Rate 05/28/20 0915 71     Resp 05/28/20 0915 18     Temp 05/28/20 0915 98.3 F (36.8 C)     Temp Source 05/28/20 0915 Oral     SpO2 05/28/20 0915 97 %     Weight --      Height --      Head Circumference --      Peak Flow --      Pain Score 05/28/20 0935 5     Pain Loc --      Pain Edu? --      Excl. in GC? --    No data  found.  Updated Vital Signs BP (!) 172/76 (  BP Location: Left Arm)   Pulse 71   Temp 98.3 F (36.8 C) (Oral)   Resp 18   SpO2 97%   Physical Exam Constitutional:      General: She is not in acute distress.    Appearance: Normal appearance. She is well-developed. She is not toxic-appearing or diaphoretic.  HENT:     Head: Normocephalic and atraumatic.  Eyes:     Conjunctiva/sclera: Conjunctivae normal.     Pupils: Pupils are equal, round, and reactive to light.  Cardiovascular:     Rate and Rhythm: Normal rate and regular rhythm.  Pulmonary:     Effort: Pulmonary effort is normal. No respiratory distress.     Comments: LCTAB Musculoskeletal:     Cervical back: Normal range of motion and neck supple.     Comments: Skin tear to right elbow, left wrist. Abrasion to BUE. Bleeding controlled without pressure. No bony tenderness. Full ROM of BUE/BLE. Strength 5/5. Sensation intact.   Skin:    General: Skin is warm and dry.  Neurological:     Mental Status: She is alert and oriented to person, place, and time.     Comments: Cranial nerves II-XII grossly intact. Strength 5/5 bilaterally for upper and lower extremity. Sensation intact. Normal coordination with normal finger to nose. Negative pronator drift, romberg. Gait intact. Able to ambulate on own without difficulty.        UC Treatments / Results  Labs (all labs ordered are listed, but only abnormal results are displayed) Labs Reviewed - No data to display  EKG   Radiology No results found.  Procedures Laceration Repair  Date/Time: 05/28/2020 1:19 PM Performed by: Belinda FisherYu, Jamilett Ferrante V, PA-C Authorized by: Belinda FisherYu, Maxemiliano Riel V, PA-C   Consent:    Consent obtained:  Verbal   Consent given by:  Patient   Risks discussed:  Infection, pain, poor cosmetic result and poor wound healing   Alternatives discussed:  No treatment Anesthesia (see MAR for exact dosages):    Anesthesia method:  Topical application   Topical anesthetic:   LET Laceration details:    Location: right elbow, left wrist. Repair type:    Repair type:  Simple Exploration:    Hemostasis achieved with:  LET   Wound exploration: wound explored through full range of motion and entire depth of wound probed and visualized   Treatment:    Area cleansed with:  Hibiclens   Amount of cleaning:  Standard   Irrigation solution:  Sterile water   Irrigation method:  Pressure wash and tap   Visualized foreign bodies/material removed: no   Skin repair:    Repair method:  Steri-Strips   Number of Steri-Strips: right elbow: 12; left wrist 4. Approximation:    Approximation:  Close Post-procedure details:    Dressing:  Bulky dressing   Patient tolerance of procedure:  Tolerated well, no immediate complications   (including critical care time)  Medications Ordered in UC Medications - No data to display  Initial Impression / Assessment and Plan / UC Course  I have reviewed the triage vital signs and the nursing notes.  Pertinent labs & imaging results that were available during my care of the patient were reviewed by me and considered in my medical decision making (see chart for details).    74 year old female comes in for evaluation after having dizziness with fall post BM. Was found to by orthostatic by EMS and provided 500mL of IV fluids. Dizziness has since resolved and patient has  been able to ambulate on own without difficulty.  Denies chest pain, shortness of breath.  Denies one-sided weakness.  Denies head injury, loss of consciousness.  Patient did notice some BRB after BM.  Had rectal exam 2 days ago due to history of UC with negative Hemoccult.  Denies abdominal pain, nausea, vomiting.  EMS revealed CBG, EKG normal. Hypertensive at 172/76. Neurology exam grossly intact without focal deficits. Attempted to draw CBC/BMP for further evaluation without success. Patient opted to follow up with PCP for further evaluation. Skin tears repaired with  steristrips. Push fluids. Low threshold for ED visit. Strict return precautions given. Patient expresses understanding and agrees to plan.  Final Clinical Impressions(s) / UC Diagnoses   Final diagnoses:  Orthostatic dizziness  Fall, initial encounter  Skin abrasion    ED Prescriptions    None     PDMP not reviewed this encounter.   Belinda Fisher, PA-C 05/28/20 1323

## 2020-05-28 NOTE — Discharge Instructions (Signed)
Unable to draw blood today. Please hydrate and follow up with PCP on Monday. If dizziness returns, chest pain, shortness of breath, passing out go to the ED for further evaluation.  Skin abrasions repaired with steristrips. Keep area clean and dry and do not get wet. The steristrips should fall off on own after 7- 10 days. Monitor for redness, warmth, pus like drainage.

## 2020-05-28 NOTE — ED Triage Notes (Signed)
Pt states she went to the restroom and after having a bowel movement became dizzy and fell. Pt states she has had previous episodes that weren't as severe. Pt possibly had a vagal episode per EMS and was positive on orthostatic evaluation. Pt is aox4 and ambulatory.

## 2020-05-30 ENCOUNTER — Ambulatory Visit: Payer: Medicare PPO | Admitting: Cardiovascular Disease

## 2020-05-30 ENCOUNTER — Encounter: Payer: Self-pay | Admitting: Cardiovascular Disease

## 2020-05-30 ENCOUNTER — Other Ambulatory Visit: Payer: Self-pay

## 2020-05-30 ENCOUNTER — Telehealth: Payer: Self-pay | Admitting: Cardiovascular Disease

## 2020-05-30 VITALS — BP 160/74 | HR 70 | Ht 64.0 in | Wt 165.4 lb

## 2020-05-30 DIAGNOSIS — R42 Dizziness and giddiness: Secondary | ICD-10-CM

## 2020-05-30 DIAGNOSIS — K625 Hemorrhage of anus and rectum: Secondary | ICD-10-CM | POA: Diagnosis not present

## 2020-05-30 DIAGNOSIS — I951 Orthostatic hypotension: Secondary | ICD-10-CM | POA: Diagnosis not present

## 2020-05-30 NOTE — Progress Notes (Signed)
Chief Complaint  Patient presents with  . Follow-up    dyspnea   History of Present Illness: 74 yo female with history of anxiety, DM2 on insulin, GERD, labile HTN, HLD, obesity, ulcerative colitis and hypothyroidism who is added onto my schedule today for evaluation of dyspnea and concern over her EKG. She is followed in our office by Dr. Eden Emms. Cardiac CT in December 2019 with mild CAD. Echo July 2021 with LVEF=55%, mild mitral regurgitation.   She fell two days ago and called 911. She tells me today that she had blood on her toilet paper Saturday after a bowel movement. She walked to bed and felt her balance was off. She then fell and hit her arm. She does not think she passed out. Her hand was then stuck in the side of a cedar chest. She called EMS. EMS told her that her EKG was abnormal. She was given a fluid bolus and not brought to the ED. Her blood sugar was fine. Her blood pressure was low on her first check but EMS had 170 systolic.   Feeling better since then. Baseline dyspnea. No chest pain.   Primary Care Physician: Shirlean Mylar, MD Cardiologist: Dr. Eden Emms  Past Medical History:  Diagnosis Date  . Anxiety   . Asthma   . Diabetes (HCC)   . Diabetes mellitus without complication (HCC)   . Diverticulitis   . GERD (gastroesophageal reflux disease)   . HTN (hypertension)   . Hypercholesteremia   . Hyperlipidemia   . Hypertension   . Obesity   . Rosacea   . Thyroid disease   . Ulcerative colitis [facility])     Past Surgical History:  Procedure Laterality Date  . ACHILLES TENDON REPAIR    . APPENDECTOMY    . BIOPSY  10/16/2018   Procedure: BIOPSY;  Surgeon: Kathi Der, MD;  Location: [facility];  Service: Gastroenterology;;  . CESAREAN SECTION    . CHOLECYSTECTOMY    . FLEXIBLE SIGMOIDOSCOPY N/A 10/16/2018   Procedure: FLEXIBLE SIGMOIDOSCOPY;  Surgeon: Kathi Der, MD;  Location: [facility];  Service: Gastroenterology;  Laterality: N/A;  . hammertoe       Current Outpatient Medications  Medication Sig Dispense Refill  . acetaminophen (TYLENOL) 500 MG tablet Take 1,000 mg by mouth 2 (two) times daily as needed (pain).    Marland Kitchen aspirin 81 MG tablet Take 81 mg by mouth daily.    . budesonide (ENTOCORT EC) 3 MG 24 hr capsule Take 3 mg by mouth 2 (two) times daily.    Marland Kitchen BYSTOLIC 10 MG tablet TAKE 1 TABLET (10 MG TOTAL) BY MOUTH DAILY. 30 tablet 5  . CALCIUM PO Take 500 mg by mouth at bedtime.    . furosemide (LASIX) 20 MG tablet Take 1 tablet (20 mg total) by mouth daily. 90 tablet 2  . hyoscyamine (LEVSIN) 0.125 MG tablet Take 0.125 mg by mouth 2 (two) times daily as needed for cramping.    . insulin glargine (LANTUS) 100 unit/mL SOPN Inject 100 Units into the skin at bedtime. 18 Units in the morning and 5 units in the pm    . Levothyroxine Sodium 50 MCG CAPS Take 50 mcg by mouth daily before breakfast.     . losartan (COZAAR) 100 MG tablet Take 1 tablet (100 mg total) by mouth daily. 90 tablet 2  . mesalamine (APRISO) 0.375 g 24 hr capsule Take 4 capsules by mouth daily.    . metFORMIN (GLUCOPHAGE-XR) 500 MG 24 hr tablet Take  3 tablets (1,500 mg total) by mouth daily.  0  . Probiotic Product (ALIGN PO) Take 1 tablet by mouth daily.    . simethicone (MYLICON) 125 MG chewable tablet Chew 125 mg by mouth every 6 (six) hours as needed for flatulence.    . simvastatin (ZOCOR) 20 MG tablet Take 20 mg by mouth every evening.     Marland Kitchen VITAMIN D PO Take 2,000 Units by mouth daily.      No current facility-administered medications for this visit.    Allergies  Allergen Reactions  . Ace Inhibitors Cough  . Ciprofloxacin Other (See Comments)    Tendinitis in heel and posterior right LE Tendon rupture  . Doxycycline Other (See Comments)    Pt does not remember what the reaction was - many years ago  . Shrimp [Shellfish Allergy] Diarrhea, Nausea And Vomiting and Rash    Social History   Socioeconomic History  . Marital status: Divorced    Spouse  name: Not on file  . Number of children: Not on file  . Years of education: Not on file  . Highest education level: Not on file  Occupational History  . Not on file  Tobacco Use  . Smoking status: Never Smoker  . Smokeless tobacco: Never Used  Vaping Use  . Vaping Use: Never used  Substance and Sexual Activity  . Alcohol use: No    Alcohol/week: 0.0 standard drinks  . Drug use: No  . Sexual activity: Not Currently  Other Topics Concern  . Not on file  Social History Narrative   ** Merged History Encounter **       Social Determinants of Health   Financial Resource Strain:   . Difficulty of Paying Living Expenses: Not on file  Food Insecurity:   . Worried About Programme researcher, broadcasting/film/video in the Last Year: Not on file  . Ran Out of Food in the Last Year: Not on file  Transportation Needs:   . Lack of Transportation (Medical): Not on file  . Lack of Transportation (Non-Medical): Not on file  Physical Activity:   . Days of Exercise per Week: Not on file  . Minutes of Exercise per Session: Not on file  Stress:   . Feeling of Stress : Not on file  Social Connections:   . Frequency of Communication with Friends and Family: Not on file  . Frequency of Social Gatherings with Friends and Family: Not on file  . Attends Religious Services: Not on file  . Active Member of Clubs or Organizations: Not on file  . Attends Banker Meetings: Not on file  . Marital Status: Not on file  Intimate Partner Violence:   . Fear of Current or Ex-Partner: Not on file  . Emotionally Abused: Not on file  . Physically Abused: Not on file  . Sexually Abused: Not on file    Family History  Problem Relation Age of Onset  . Hypertension Mother   . Heart failure Mother   . Heart attack Mother   . Hypotension Father   . Dementia Father     Review of Systems:  As stated in the HPI and otherwise negative.   BP (!) 160/74   Pulse 70   Ht 5\' 4"  (1.626 m)   Wt 165 lb 6.4 oz (75 kg)    SpO2 95%   BMI 28.39 kg/m   Physical Examination: General: Well developed, well nourished, NAD  HEENT: OP clear, mucus membranes moist  SKIN:  warm, dry. No rashes. Neuro: No focal deficits  Musculoskeletal: Muscle strength 5/5 all ext  Psychiatric: Mood and affect normal  Neck: No JVD, no carotid bruits, no thyromegaly, no lymphadenopathy.  Lungs:Clear bilaterally, no wheezes, rhonci, crackles Cardiovascular: Regular rate and rhythm. No murmurs, gallops or rubs. Abdomen:Soft. Bowel sounds present. Non-tender.  Extremities: No lower extremity edema. Pulses are 2 + in the bilateral DP/PT.  EKG:  EKG is ordered today. The ekg ordered today demonstrates Sinus, Poor R wave progression, chronic  Recent Labs: 02/16/2020: ALT 13; B Natriuretic Peptide 241.6; Hemoglobin 12.6; Platelets 251 02/25/2020: NT-Pro BNP 1,261 03/22/2020: BUN 13; Creatinine, Ser 0.86; Potassium 4.5; Sodium 141   Lipid Panel    Component Value Date/Time   CHOL 126 04/03/2018 1842   TRIG 159 (H) 04/03/2018 1842   HDL 41 04/03/2018 1842   CHOLHDL 3.1 04/03/2018 1842   VLDL 32 04/03/2018 1842   LDLCALC 53 04/03/2018 1842     Wt Readings from Last 3 Encounters:  05/30/20 165 lb 6.4 oz (75 kg)  03/22/20 164 lb (74.4 kg)  02/16/20 166 lb (75.3 kg)      Assessment and Plan:   1. Dizziness: Her event sounds vasovagal, perhaps with orthostatic hypotension. EKG is unchanged. Sinus today with poor R wave progression. I have encouraged her to push her po intake.   Current medicines are reviewed at length with the patient today.  The patient does not have concerns regarding medicines.  The following changes have been made:  no change  Labs/ tests ordered today include:   Orders Placed This Encounter  Procedures  . EKG 12-Lead     Disposition:   FU with Dr. Eden Emms in 6 weeks as planned.    Signed, Verne Carrow, MD 05/30/2020 1:20 PM    [facility] [address] [place],  [place], [place]  [address] Phone: [phone]; Fax: [phone]

## 2020-05-30 NOTE — Telephone Encounter (Signed)
Pt returning [name]'s Call

## 2020-05-30 NOTE — Telephone Encounter (Signed)
Patient called back. Patient stated she fell on Saturday and called EMS. EMS gave her 500 ml of fluid and stated her EKG showed NSR with possible infarct undetermined. Patient stated she was positive for ortho static hypotension. She complained of SOB with activity, dizziness, and edema in ankles. Patient stated EMS told her to follow up with cardiology as soon as possible. Made patient an appointment with DOD today to be evaluated. Patient verbalized understanding.

## 2020-05-30 NOTE — Patient Instructions (Signed)
Medication Instructions:  Your provider recommends that you continue on your current medications as directed. Please refer to the Current Medication list given to you today.   *If you need a refill on your cardiac medications before your next appointment, please call your pharmacy*   Follow-Up: Please keep your appointment as scheduled with Dr. Eden Emms!  Make sure you stay well hydrated!

## 2020-05-30 NOTE — Telephone Encounter (Signed)
Patient states she would also like to make Dr. Eden Emms aware that on the night of 05/28/20 she had a fall. She would like a call back to further discuss.

## 2020-05-30 NOTE — Telephone Encounter (Signed)
Left message for patient to call back  

## 2020-06-03 DIAGNOSIS — Z1159 Encounter for screening for other viral diseases: Secondary | ICD-10-CM | POA: Diagnosis not present

## 2020-06-03 DIAGNOSIS — R110 Nausea: Secondary | ICD-10-CM | POA: Diagnosis not present

## 2020-06-03 DIAGNOSIS — D500 Iron deficiency anemia secondary to blood loss (chronic): Secondary | ICD-10-CM | POA: Diagnosis not present

## 2020-06-03 DIAGNOSIS — R195 Other fecal abnormalities: Secondary | ICD-10-CM | POA: Diagnosis not present

## 2020-06-03 DIAGNOSIS — K529 Noninfective gastroenteritis and colitis, unspecified: Secondary | ICD-10-CM | POA: Diagnosis not present

## 2020-06-03 DIAGNOSIS — K625 Hemorrhage of anus and rectum: Secondary | ICD-10-CM | POA: Diagnosis not present

## 2020-06-06 DIAGNOSIS — K633 Ulcer of intestine: Secondary | ICD-10-CM | POA: Diagnosis not present

## 2020-06-06 DIAGNOSIS — D62 Acute posthemorrhagic anemia: Secondary | ICD-10-CM | POA: Diagnosis not present

## 2020-06-06 DIAGNOSIS — K5289 Other specified noninfective gastroenteritis and colitis: Secondary | ICD-10-CM | POA: Diagnosis not present

## 2020-06-06 DIAGNOSIS — R195 Other fecal abnormalities: Secondary | ICD-10-CM | POA: Diagnosis not present

## 2020-06-06 DIAGNOSIS — K2930 Chronic superficial gastritis without bleeding: Secondary | ICD-10-CM | POA: Diagnosis not present

## 2020-06-06 DIAGNOSIS — B9681 Helicobacter pylori [H. pylori] as the cause of diseases classified elsewhere: Secondary | ICD-10-CM | POA: Diagnosis not present

## 2020-06-06 DIAGNOSIS — R110 Nausea: Secondary | ICD-10-CM | POA: Diagnosis not present

## 2020-06-06 DIAGNOSIS — K921 Melena: Secondary | ICD-10-CM | POA: Diagnosis not present

## 2020-06-06 DIAGNOSIS — K449 Diaphragmatic hernia without obstruction or gangrene: Secondary | ICD-10-CM | POA: Diagnosis not present

## 2020-06-08 DIAGNOSIS — R55 Syncope and collapse: Secondary | ICD-10-CM | POA: Diagnosis not present

## 2020-06-09 DIAGNOSIS — K2930 Chronic superficial gastritis without bleeding: Secondary | ICD-10-CM | POA: Diagnosis not present

## 2020-06-09 DIAGNOSIS — K5289 Other specified noninfective gastroenteritis and colitis: Secondary | ICD-10-CM | POA: Diagnosis not present

## 2020-06-09 DIAGNOSIS — B9681 Helicobacter pylori [H. pylori] as the cause of diseases classified elsewhere: Secondary | ICD-10-CM | POA: Diagnosis not present

## 2020-06-27 DIAGNOSIS — D500 Iron deficiency anemia secondary to blood loss (chronic): Secondary | ICD-10-CM | POA: Diagnosis not present

## 2020-06-27 DIAGNOSIS — A048 Other specified bacterial intestinal infections: Secondary | ICD-10-CM | POA: Diagnosis not present

## 2020-06-27 DIAGNOSIS — R110 Nausea: Secondary | ICD-10-CM | POA: Diagnosis not present

## 2020-06-27 DIAGNOSIS — K529 Noninfective gastroenteritis and colitis, unspecified: Secondary | ICD-10-CM | POA: Diagnosis not present

## 2020-07-07 NOTE — Progress Notes (Signed)
Cardiology Office Note   Date:  07/11/2020   ID:  Kristina [name], Kristina [name] 03-15-1946, MRN [id number]  PCP:  Shirlean Mylar, MD  Cardiologist:  Charlton Haws, MD EP: None  No chief complaint on file.     History of Present Illness: Kristina [name] is a 74 y.o. female with a PMH of labile HTN, HLD, DM type 2 on insulin, and hypothyroidism, who presents for follow-up  In 2019 had SSCP in setting of HTN urgency with cardiac CT 07/04/18 showing non-obstructive disease.Seen in ED June 20, 21 with edema and dyspnea BNP 283 CXR NAD, LE duplex no DVT Rx lasix  She has large varicosities in LE;s Norvasc D/c due to edema Seen by Dr Clifton James 05/30/20 She fell being off balance BP initially low given fluid. ECG ok SR poor R wave progression old no changes made  Doing much better not sure what was going on for 3 weeks " but I wasn't right in the head"  Previous pre syncope Likely vagal episode   Home BP records are good     Past Medical History:  Diagnosis Date  . Anxiety   . Asthma   . Diabetes (HCC)   . Diabetes mellitus without complication (HCC)   . Diverticulitis   . GERD (gastroesophageal reflux disease)   . HTN (hypertension)   . Hypercholesteremia   . Hyperlipidemia   . Hypertension   . Obesity   . Rosacea   . Thyroid disease   . Ulcerative colitis [facility])     Past Surgical History:  Procedure Laterality Date  . ACHILLES TENDON REPAIR    . APPENDECTOMY    . BIOPSY  10/16/2018   Procedure: BIOPSY;  Surgeon: Kathi Der, MD;  [facility]: [facility];  Service: Gastroenterology;;  . CESAREAN SECTION    . CHOLECYSTECTOMY    . FLEXIBLE SIGMOIDOSCOPY N/A 10/16/2018   Procedure: FLEXIBLE SIGMOIDOSCOPY;  Surgeon: Kathi Der, MD;  Location: [facility];  Service: Gastroenterology;  Laterality: N/A;  . hammertoe       Current Outpatient Medications  Medication Sig Dispense Refill  . acetaminophen (TYLENOL) 500 MG tablet Take 1,000 mg by mouth 2 (two) times daily as  needed (pain).    . budesonide (ENTOCORT EC) 3 MG 24 hr capsule Take 3 mg by mouth 2 (two) times daily.    Marland Kitchen BYSTOLIC 10 MG tablet TAKE 1 TABLET (10 MG TOTAL) BY MOUTH DAILY. 30 tablet 5  . CALCIUM PO Take 500 mg by mouth at bedtime.    . furosemide (LASIX) 20 MG tablet Take 1 tablet (20 mg total) by mouth daily. 90 tablet 2  . hyoscyamine (LEVSIN) 0.125 MG tablet Take 0.125 mg by mouth 2 (two) times daily as needed for cramping.    . insulin glargine (LANTUS) 100 unit/mL SOPN Inject 100 Units into the skin at bedtime. 18 Units in the morning and 5 units in the pm    . Levothyroxine Sodium 50 MCG CAPS Take 50 mcg by mouth daily before breakfast.     . losartan (COZAAR) 100 MG tablet Take 1 tablet (100 mg total) by mouth daily. 90 tablet 2  . mesalamine (APRISO) 0.375 g 24 hr capsule Take 4 capsules by mouth daily.    . metFORMIN (GLUCOPHAGE-XR) 500 MG 24 hr tablet Take 3 tablets (1,500 mg total) by mouth daily.  0  . Probiotic Product (ALIGN PO) Take 1 tablet by mouth daily.    . simethicone (MYLICON) 125 MG chewable  tablet Chew 125 mg by mouth every 6 (six) hours as needed for flatulence.    . simvastatin (ZOCOR) 20 MG tablet Take 20 mg by mouth every evening.     Marland Kitchen VITAMIN D PO Take 2,000 Units by mouth daily.      No current facility-administered medications for this visit.    Allergies:   Ace inhibitors, Ciprofloxacin, Doxycycline, and Shrimp [shellfish allergy]    Social History:  The patient  reports that she has never smoked. She has never used smokeless tobacco. She reports that she does not drink alcohol and does not use drugs.   Family History:  The patient's family history includes Dementia in her father; Heart attack in her mother; Heart failure in her mother; Hypertension in her mother; Hypotension in her father.    ROS:  Please see the history of present illness.   Otherwise, review of systems are positive for none.   All other systems are reviewed and negative.     PHYSICAL EXAM: VS:  BP (!) 174/78   Pulse 73   Ht 5\' 4"  (1.626 m)   Wt 74.4 kg   SpO2 98%   BMI 28.15 kg/m  , BMI Body mass index is 28.15 kg/m.  Affect appropriate Elderly female  HEENT: normal Neck supple with no adenopathy JVP normal no bruits no thyromegaly Lungs clear with no wheezing and good diaphragmatic motion Heart:  S1/S2 no murmur, no rub, gallop or click PMI normal Abdomen: benighn, BS positve, no tenderness, no AAA no bruit.  No HSM or HJR Distal pulses intact with no bruits LE varicosities with edema Neuro non-focal Skin warm and dry No muscular weakness   EKG:   SR rate 70 poor R wave progression 05/30/20    Recent Labs: 02/16/2020: ALT 13; B Natriuretic Peptide 241.6; Hemoglobin 12.6; Platelets 251 02/25/2020: NT-Pro BNP 1,261 03/22/2020: BUN 13; Creatinine, Ser 0.86; Potassium 4.5; Sodium 141    Lipid Panel    Component Value Date/Time   CHOL 126 04/03/2018 1842   TRIG 159 (H) 04/03/2018 1842   HDL 41 04/03/2018 1842   CHOLHDL 3.1 04/03/2018 1842   VLDL 32 04/03/2018 1842   LDLCALC 53 04/03/2018 1842      Wt Readings from Last 3 Encounters:  07/11/20 74.4 kg  05/30/20 75 kg  03/22/20 74.4 kg      Other studies Reviewed: Additional studies/ records that were reviewed today include:   NST 02/2017:  Nuclear stress EF: 51%.  There was no ST segment deviation noted during stress.  No T wave inversion was noted during stress.  The study is normal.  This is a low risk study.   Low risk stress nuclear study with normal perfusion and low normal left ventricular global systolic function.  Echocardiogram 02/25/20   1. Mid and basal inferior wall hypokinesis Normal GLS -15. Left  ventricular ejection fraction, by estimation, is 55%. The left ventricle  has normal function. The left ventricle has no regional wall motion  abnormalities. Left ventricular diastolic  parameters are consistent with Grade I diastolic dysfunction (impaired   relaxation).  2. Right ventricular systolic function is normal. The right ventricular  size is normal. There is normal pulmonary artery systolic pressure.  3. Left atrial size was moderately dilated.  4. The mitral valve is normal in structure. Mild mitral valve  regurgitation. No evidence of mitral stenosis.  5. The aortic valve is tricuspid. Aortic valve regurgitation is not  visualized. Mild aortic valve sclerosis is present,  with no evidence of  aortic valve stenosis.  6. The inferior vena cava is normal in size with greater than 50%  respiratory variability, suggesting right atrial pressure of 3 mmHg.     ASSESSMENT AND PLAN:  1. Chest pain: Atypical  Cardiac CT 07/04/18 calcium score 122 68 th percentile non obstructive disease in RCA/LAD < 50% proximal LAD and ostial / mid RCA  Observe   2. Labile HTN:   Currently on cozaar , lasix and bystolic f/u Dr Hyman Hopes home readings ok   3. Varicose Veins:  Discussed primary making referral to [facility] , compression stockings, elevation and low dose non lasix diuretic D/c norvasc not ideal for patient with edema.   4. HLD: LDL 53 03/2018 - Continue statin  5. DM type 2: Last A1C 5.7 02/2018; at goal of <7 - Continue current regimen and close follow-up with PCP  6. Diastolic CHF:   BNP 1261 02/25/20 echo 02/25/20 EF 55% GLS normal -15 only grade one diastolic and mild MR  She is euovolemic knows to take additional lasix as needed      No orders of the defined types were placed in this encounter.    Disposition:   FU with me in 6 months   Signed, Charlton Haws, MD  07/11/2020 9:04 AM

## 2020-07-11 ENCOUNTER — Other Ambulatory Visit: Payer: Self-pay

## 2020-07-11 ENCOUNTER — Ambulatory Visit: Payer: Medicare PPO | Admitting: Cardiovascular Disease

## 2020-07-11 ENCOUNTER — Encounter: Payer: Self-pay | Admitting: Cardiovascular Disease

## 2020-07-11 VITALS — BP 174/78 | HR 73 | Ht 64.0 in | Wt 164.0 lb

## 2020-07-11 DIAGNOSIS — I5032 Chronic diastolic (congestive) heart failure: Secondary | ICD-10-CM

## 2020-07-11 DIAGNOSIS — I10 Essential (primary) hypertension: Secondary | ICD-10-CM

## 2020-07-11 NOTE — Patient Instructions (Signed)

## 2020-09-13 DIAGNOSIS — E119 Type 2 diabetes mellitus without complications: Secondary | ICD-10-CM | POA: Diagnosis not present

## 2020-09-13 DIAGNOSIS — H40033 Anatomical narrow angle, bilateral: Secondary | ICD-10-CM | POA: Diagnosis not present

## 2020-09-21 ENCOUNTER — Other Ambulatory Visit: Payer: Self-pay

## 2020-09-21 ENCOUNTER — Ambulatory Visit
Admission: EM | Admit: 2020-09-21 | Discharge: 2020-09-21 | Disposition: A | Discharge status: Home / Self Care | Payer: Medicare PPO | Attending: Emergency Medicine | Admitting: Emergency Medicine

## 2020-09-21 DIAGNOSIS — N12 Tubulo-interstitial nephritis, not specified as acute or chronic: Secondary | ICD-10-CM | POA: Diagnosis not present

## 2020-09-21 LAB — POCT URINALYSIS DIP (MANUAL ENTRY)
Bilirubin, UA: NEGATIVE
Glucose, UA: NEGATIVE mg/dL
Ketones, POC UA: NEGATIVE mg/dL
Nitrite, UA: POSITIVE — AB
Protein Ur, POC: 30 mg/dL — AB
Spec Grav, UA: 1.015 (ref 1.010–1.025)
Urobilinogen, UA: 0.2 E.U./dL
pH, UA: 6 (ref 5.0–8.0)

## 2020-09-21 MED ORDER — CEFTRIAXONE SODIUM 1 G IJ SOLR
1.0000 g | Freq: Once | INTRAMUSCULAR | Status: AC
  Administered 2020-09-21: 1 g via INTRAMUSCULAR

## 2020-09-21 MED ORDER — SULFAMETHOXAZOLE-TRIMETHOPRIM 800-160 MG PO TABS: 1.0000 | ORAL_TABLET | Freq: Two times a day (BID) | ORAL | 0 refills | Status: AC

## 2020-09-21 NOTE — ED Triage Notes (Addendum)
Pt c/o urinary urgency, frequency, slight burning, and chills since Sunday. States took tylenol 650mg  at 7am.

## 2020-09-21 NOTE — Discharge Instructions (Addendum)
We gave you a shot of Rocephin, continue with Bactrim twice daily for 1 week Drink plenty of water and fluids Tylenol 1000 mg every 4-6 hours for fever, headache, body aches chills Please follow-up if any symptoms not improving or worsening

## 2020-09-21 NOTE — ED Provider Notes (Signed)
[facility]    CSN: [id number] Arrival date & time: 09/21/20  7412      History   Chief Complaint Chief Complaint  Patient presents with  . Urinary Tract Infection    HPI Kristina [name] is a 75 y.o. female history hypertension, DM type II, presenting today for evaluation of possible UTI.  Over the past 4 to 5 days patient has had dysuria, urinary urgency and frequency.  Has also developed some chills, shaking and some back pain over the past few days as well.  HPI  Past Medical History:  Diagnosis Date  . Anxiety   . Asthma   . Diabetes (HCC)   . Diabetes mellitus without complication (HCC)   . Diverticulitis   . GERD (gastroesophageal reflux disease)   . HTN (hypertension)   . Hypercholesteremia   . Hyperlipidemia   . Hypertension   . Obesity   . Rosacea   . Thyroid disease   . Ulcerative colitis [facility])     Patient Active Problem List   Diagnosis Date Noted  . HLD (hyperlipidemia) 10/15/2018  . GIB (gastrointestinal bleeding) 10/15/2018  . Colitis 10/15/2018  . Hypokalemia   . Hypothyroidism   . Acute pyelonephritis 03/22/2018  . Pyelonephritis   . UTI (urinary tract infection) 03/21/2018  . IBS (irritable bowel syndrome) 03/21/2018  . Morbid obesity (HCC) 08/08/2015  . Acute hyponatremia 05/01/2015  . Gastroenteritis, acute 05/01/2015  . Diabetes mellitus type 2, controlled (HCC) 05/01/2015  . Asthma in remission 05/01/2015  . Diarrhea   . Cough variant asthma 04/19/2015  . Acute bronchitis 03/08/2015  . Hypothyroidism, adult 02/19/2015  . Dyspnea 02/18/2015  . Upper airway cough syndrome 01/07/2015  . Chest pain 09/15/2014  . Pre-syncope 12/08/2013  . PVC (premature ventricular contraction) 08/26/2013  . Rosacea   . Anxiety   . Diverticulitis   . Diabetes (HCC)   . Essential hypertension   . Obesity     Past Surgical History:  Procedure Laterality Date  . ACHILLES TENDON REPAIR    . APPENDECTOMY    . BIOPSY  10/16/2018    Procedure: BIOPSY;  Surgeon: Kathi Der, MD;  Location: [facility];  Service: Gastroenterology;;  . CESAREAN SECTION    . CHOLECYSTECTOMY    . FLEXIBLE SIGMOIDOSCOPY N/A 10/16/2018   Procedure: FLEXIBLE SIGMOIDOSCOPY;  Surgeon: Kathi Der, MD;  Location: [facility];  Service: Gastroenterology;  Laterality: N/A;  . hammertoe      OB History    Gravida  0   Para  0   Term  0   Preterm  0   AB  0   Living        SAB  0   IAB  0   Ectopic  0   Multiple      Live Births               Home Medications    Prior to Admission medications   Medication Sig Start Date End Date Taking? Authorizing Provider  sulfamethoxazole-trimethoprim (BACTRIM DS) 800-160 MG tablet Take 1 tablet by mouth 2 (two) times daily for 7 days. 09/21/20 09/28/20 Yes Audyn Dimercurio C, PA-C  acetaminophen (TYLENOL) 500 MG tablet Take 1,000 mg by mouth 2 (two) times daily as needed (pain).    [provider]  budesonide (ENTOCORT EC) 3 MG 24 hr capsule Take 3 mg by mouth 2 (two) times daily.    [provider]  BYSTOLIC 10 MG tablet TAKE 1 TABLET (10  MG TOTAL) BY MOUTH DAILY. 07/15/15   Nyoka Cowden, MD  CALCIUM PO Take 500 mg by mouth at bedtime.    [provider]  furosemide (LASIX) 20 MG tablet Take 1 tablet (20 mg total) by mouth daily. 02/17/20   [name], MD  hyoscyamine (LEVSIN) 0.125 MG tablet Take 0.125 mg by mouth 2 (two) times daily as needed for cramping. 01/04/20   [provider]  insulin glargine (LANTUS) 100 unit/mL SOPN Inject 100 Units into the skin at bedtime. 18 Units in the morning and 5 units in the pm    [provider]  Levothyroxine Sodium 50 MCG CAPS Take 50 mcg by mouth daily before breakfast.     [provider]  losartan (COZAAR) 100 MG tablet Take 1 tablet (100 mg total) by mouth daily. 02/17/20   [name], MD  mesalamine (APRISO) 0.375 g 24 hr capsule Take 4 capsules by mouth daily. 08/28/18    [provider]  metFORMIN (GLUCOPHAGE-XR) 500 MG 24 hr tablet Take 3 tablets (1,500 mg total) by mouth daily. 04/05/18   Lenox Ponds, MD  Probiotic Product (ALIGN PO) Take 1 tablet by mouth daily.    [provider]  simethicone (MYLICON) 125 MG chewable tablet Chew 125 mg by mouth every 6 (six) hours as needed for flatulence.    [provider]  simvastatin (ZOCOR) 20 MG tablet Take 20 mg by mouth every evening.  08/05/13   [provider]  VITAMIN D PO Take 2,000 Units by mouth daily.     [provider]    Family History Family History  Problem Relation Age of Onset  . Hypertension Mother   . Heart failure Mother   . Heart attack Mother   . Hypotension Father   . Dementia Father     Social History Social History   Tobacco Use  . Smoking status: Never Smoker  . Smokeless tobacco: Never Used  Vaping Use  . Vaping Use: Never used  Substance Use Topics  . Alcohol use: No    Alcohol/week: 0.0 standard drinks  . Drug use: No     Allergies   Ace inhibitors, Ciprofloxacin, Doxycycline, and Shrimp [shellfish allergy]   Review of Systems Review of Systems  Constitutional: Positive for chills and fever.  Respiratory: Negative for shortness of breath.   Cardiovascular: Negative for chest pain.  Gastrointestinal: Negative for abdominal pain, diarrhea, nausea and vomiting.  Genitourinary: Positive for dysuria, frequency and urgency. Negative for flank pain, genital sores, hematuria, menstrual problem, vaginal bleeding, vaginal discharge and vaginal pain.  Musculoskeletal: Negative for back pain.  Skin: Negative for rash.  Neurological: Negative for dizziness, light-headedness and headaches.     Physical Exam Triage Vital Signs ED Triage Vitals  Enc Vitals Group     BP      Pulse      Resp      Temp      Temp src      SpO2      Weight      Height      Head Circumference      Peak Flow      Pain Score      Pain Loc       Pain Edu?      Excl. in GC?    No data found.  Updated Vital Signs BP 124/75 (BP Location: Left Arm)   Pulse 76   Temp (!) 100.8 F (38.2  C) (Oral)   Resp 18   SpO2 93%   Visual Acuity Right Eye Distance:   Left Eye Distance:   Bilateral Distance:    Right Eye Near:   Left Eye Near:    Bilateral Near:     Physical Exam Vitals and nursing note reviewed.  Constitutional:      Appearance: She is well-developed and well-nourished.     Comments: No acute distress  HENT:     Head: Normocephalic and atraumatic.     Nose: Nose normal.  Eyes:     Conjunctiva/sclera: Conjunctivae normal.  Cardiovascular:     Rate and Rhythm: Normal rate.  Pulmonary:     Effort: Pulmonary effort is normal. No respiratory distress.  Abdominal:     General: There is no distension.  Musculoskeletal:        General: Normal range of motion.     Cervical back: Neck supple.  Skin:    General: Skin is warm and dry.  Neurological:     Mental Status: She is alert and oriented to person, place, and time.  Psychiatric:        Mood and Affect: Mood and affect normal.      UC Treatments / Results  Labs (all labs ordered are listed, but only abnormal results are displayed) Labs Reviewed  POCT URINALYSIS DIP (MANUAL ENTRY) - Abnormal; Notable for the following components:      Result Value   Clarity, UA cloudy (*)    Blood, UA moderate (*)    Protein Ur, POC =30 (*)    Nitrite, UA Positive (*)    Leukocytes, UA Large (3+) (*)    All other components within normal limits  URINE CULTURE    EKG   Radiology No results found.  Procedures Procedures (including critical care time)  Medications Ordered in UC Medications  cefTRIAXone (ROCEPHIN) injection 1 g (1 g Intramuscular Given 09/21/20 0914)    Initial Impression / Assessment and Plan / UC Course  I have reviewed the triage vital signs and the nursing notes.  Pertinent labs & imaging results that were available during my care  of the patient were reviewed by me and considered in my medical decision making (see chart for details).     UA consistent with UTI, given associated fever and chills treating for pyelonephritis with Rocephin prior to discharge, continuing on Bactrim x1 week, and Tylenol for fever.  Push fluids.  Urine culture pending, will alter therapy as needed.  Discussed strict return precautions. Patient verbalized understanding and is agreeable with plan.  Final Clinical Impressions(s) / UC Diagnoses   Final diagnoses:  Pyelonephritis     Discharge Instructions     We gave you a shot of Rocephin, continue with Bactrim twice daily for 1 week Drink plenty of water and fluids Tylenol 1000 mg every 4-6 hours for fever, headache, body aches chills Please follow-up if any symptoms not improving or worsening    ED Prescriptions    Medication Sig Dispense Auth. Provider   sulfamethoxazole-trimethoprim (BACTRIM DS) 800-160 MG tablet Take 1 tablet by mouth 2 (two) times daily for 7 days. 14 tablet Jahvon Gosline, [place] C, PA-C     PDMP not reviewed this encounter.   Lew Dawes, [place] 09/21/20 [phone]

## 2020-09-22 DIAGNOSIS — M1712 Unilateral primary osteoarthritis, left knee: Secondary | ICD-10-CM | POA: Diagnosis not present

## 2020-09-22 DIAGNOSIS — K529 Noninfective gastroenteritis and colitis, unspecified: Secondary | ICD-10-CM | POA: Diagnosis not present

## 2020-09-22 DIAGNOSIS — K580 Irritable bowel syndrome with diarrhea: Secondary | ICD-10-CM | POA: Diagnosis not present

## 2020-09-22 DIAGNOSIS — A048 Other specified bacterial intestinal infections: Secondary | ICD-10-CM | POA: Diagnosis not present

## 2020-09-23 LAB — URINE CULTURE: Culture: >=100000 — AB

## 2020-10-03 ENCOUNTER — Ambulatory Visit
Admission: EM | Admit: 2020-10-03 | Discharge: 2020-10-03 | Disposition: A | Discharge status: Home / Self Care | Payer: Medicare PPO | Attending: Family Medicine | Admitting: Family Medicine

## 2020-10-03 ENCOUNTER — Other Ambulatory Visit: Payer: Self-pay

## 2020-10-03 DIAGNOSIS — M542 Cervicalgia: Secondary | ICD-10-CM

## 2020-10-03 DIAGNOSIS — R519 Headache, unspecified: Secondary | ICD-10-CM

## 2020-10-03 MED ORDER — TIZANIDINE HCL 2 MG PO CAPS: 2.0000 mg | ORAL_CAPSULE | Freq: Three times a day (TID) | ORAL | 0 refills | Status: DC

## 2020-10-03 NOTE — ED Triage Notes (Signed)
Pt presents with complaints of pain behind her right ear since Friday. Reports she recently got new glasses and is not sure if they are irritating her. Pt also states she threw up once over the weekend and has had a decreased appetite.

## 2020-10-03 NOTE — Discharge Instructions (Signed)
I have prescribed you tizanidine you can take 1 to 2 tablets as needed for headache related pain take medication up to 3 times daily however medication can cause severe drowsiness therefore avoid driving while taking this medication. If your head pain or neck pain worsens and is associated with any weakness, dizziness or changes in vision go immediately to the emergency department.  If headache pain remains the same and is unchanged with tizanidine follow-up with primary care provider within the next 2 days to schedule a follow-up evaluation.

## 2020-10-03 NOTE — ED Provider Notes (Signed)
[facility]    CSN: [id number] Arrival date & time: 10/03/20  3790      History   Chief Complaint Chief Complaint  Patient presents with  . Otalgia    HPI Kristina [name] is a 75 y.o. female.   HPI  Patient presents today with a concern of localized tenderness at the right lateral occipital region of the head and right upper posterior neck x 4 days. She initially thought this was related to sleeping in an abnormal position.  She is taking Tylenol and apply heat to her neck without relief of pain.  Pain ranges from dull aching to occasionally sharp.  Pain is exacerbated by lateral rotations to the left.  Pain improves with lying on the right side.  Patient denies any dizziness, weakness, changes in vision related to current symptoms. Past Medical History:  Diagnosis Date  . Anxiety   . Asthma   . Diabetes (HCC)   . Diabetes mellitus without complication (HCC)   . Diverticulitis   . GERD (gastroesophageal reflux disease)   . HTN (hypertension)   . Hypercholesteremia   . Hyperlipidemia   . Hypertension   . Obesity   . Rosacea   . Thyroid disease   . Ulcerative colitis [facility])     Patient Active Problem List   Diagnosis Date Noted  . HLD (hyperlipidemia) 10/15/2018  . GIB (gastrointestinal bleeding) 10/15/2018  . Colitis 10/15/2018  . Hypokalemia   . Hypothyroidism   . Acute pyelonephritis 03/22/2018  . Pyelonephritis   . UTI (urinary tract infection) 03/21/2018  . IBS (irritable bowel syndrome) 03/21/2018  . Morbid obesity (HCC) 08/08/2015  . Acute hyponatremia 05/01/2015  . Gastroenteritis, acute 05/01/2015  . Diabetes mellitus type 2, controlled (HCC) 05/01/2015  . Asthma in remission 05/01/2015  . Diarrhea   . Cough variant asthma 04/19/2015  . Acute bronchitis 03/08/2015  . Hypothyroidism, adult 02/19/2015  . Dyspnea 02/18/2015  . Upper airway cough syndrome 01/07/2015  . Chest pain 09/15/2014  . Pre-syncope 12/08/2013  . PVC (premature  ventricular contraction) 08/26/2013  . Rosacea   . Anxiety   . Diverticulitis   . Diabetes (HCC)   . Essential hypertension   . Obesity     Past Surgical History:  Procedure Laterality Date  . ACHILLES TENDON REPAIR    . APPENDECTOMY    . BIOPSY  10/16/2018   Procedure: BIOPSY;  Surgeon: Kathi Der, MD;  Location: [facility];  Service: Gastroenterology;;  . CESAREAN SECTION    . CHOLECYSTECTOMY    . FLEXIBLE SIGMOIDOSCOPY N/A 10/16/2018   Procedure: FLEXIBLE SIGMOIDOSCOPY;  Surgeon: Kathi Der, MD;  Location: [facility];  Service: Gastroenterology;  Laterality: N/A;  . hammertoe      OB History    Gravida  0   Para  0   Term  0   Preterm  0   AB  0   Living        SAB  0   IAB  0   Ectopic  0   Multiple      Live Births               Home Medications    Prior to Admission medications   Medication Sig Start Date End Date Taking? Authorizing Provider  acetaminophen (TYLENOL) 500 MG tablet Take 1,000 mg by mouth 2 (two) times daily as needed (pain).    [provider]  budesonide (ENTOCORT EC) 3 MG 24 hr capsule Take 3 mg  by mouth 2 (two) times daily.    [provider]  BYSTOLIC 10 MG tablet TAKE 1 TABLET (10 MG TOTAL) BY MOUTH DAILY. 07/15/15   Nyoka Cowden, MD  CALCIUM PO Take 500 mg by mouth at bedtime.    [provider]  furosemide (LASIX) 20 MG tablet Take 1 tablet (20 mg total) by mouth daily. 02/17/20   [name], MD  hyoscyamine (LEVSIN) 0.125 MG tablet Take 0.125 mg by mouth 2 (two) times daily as needed for cramping. 01/04/20   [provider]  insulin glargine (LANTUS) 100 unit/mL SOPN Inject 100 Units into the skin at bedtime. 18 Units in the morning and 5 units in the pm    [provider]  Levothyroxine Sodium 50 MCG CAPS Take 50 mcg by mouth daily before breakfast.     [provider]  losartan (COZAAR) 100 MG tablet Take 1 tablet (100 mg total) by mouth daily.  02/17/20   [name], MD  mesalamine (APRISO) 0.375 g 24 hr capsule Take 4 capsules by mouth daily. 08/28/18   [provider]  metFORMIN (GLUCOPHAGE-XR) 500 MG 24 hr tablet Take 3 tablets (1,500 mg total) by mouth daily. 04/05/18   Lenox Ponds, MD  Probiotic Product (ALIGN PO) Take 1 tablet by mouth daily.    [provider]  simethicone (MYLICON) 125 MG chewable tablet Chew 125 mg by mouth every 6 (six) hours as needed for flatulence.    [provider]  simvastatin (ZOCOR) 20 MG tablet Take 20 mg by mouth every evening.  08/05/13   [provider]  VITAMIN D PO Take 2,000 Units by mouth daily.     [provider]    Family History Family History  Problem Relation Age of Onset  . Hypertension Mother   . Heart failure Mother   . Heart attack Mother   . Hypotension Father   . Dementia Father     Social History Social History   Tobacco Use  . Smoking status: Never Smoker  . Smokeless tobacco: Never Used  Vaping Use  . Vaping Use: Never used  Substance Use Topics  . Alcohol use: No    Alcohol/week: 0.0 standard drinks  . Drug use: No     Allergies   Ace inhibitors, Ciprofloxacin, Doxycycline, and Shrimp [shellfish allergy]   Review of Systems Review of Systems Pertinent negatives listed in HPI   Physical Exam Triage Vital Signs ED Triage Vitals  Enc Vitals Group     BP 10/03/20 0829 123/79     Pulse Rate 10/03/20 0829 75     Resp 10/03/20 0829 17     Temp 10/03/20 0829 98.7 F (37.1 C)     Temp src --      SpO2 10/03/20 0829 94 %     Weight --      Height --      Head Circumference --      Peak Flow --      Pain Score 10/03/20 0827 5     Pain Loc --      Pain Edu? --      Excl. in GC? --    No data found.  Updated Vital Signs BP 123/79   Pulse 75   Temp 98.7 F (37.1 C)   Resp 17   SpO2 94%   Visual Acuity Right Eye Distance:   Left Eye Distance:   Bilateral Distance:    Right Eye Near:  Left Eye Near:    Bilateral Near:     Physical Exam Constitutional:      Appearance: Normal appearance. She is not ill-appearing.  HENT:     Head:      Nose: Nose normal.  Cardiovascular:     Rate and Rhythm: Normal rate and regular rhythm.  Pulmonary:     Effort: Pulmonary effort is normal.     Breath sounds: Normal breath sounds.  Musculoskeletal:        General: Normal range of motion.     Cervical back: Normal range of motion.  Lymphadenopathy:     Cervical: No cervical adenopathy.  Skin:    Capillary Refill: Capillary refill takes less than 2 seconds.  Neurological:     General: No focal deficit present.     Mental Status: She is alert. Mental status is at baseline. She is disoriented.  Psychiatric:        Mood and Affect: Mood normal.        Behavior: Behavior normal.        Thought Content: Thought content normal.        Judgment: Judgment normal.      UC Treatments / Results  Labs (all labs ordered are listed, but only abnormal results are displayed) Labs Reviewed - No data to display  EKG   Radiology No results found.  Procedures Procedures (including critical care time)  Medications Ordered in UC Medications - No data to display  Initial Impression / Assessment and Plan / UC Course  I have reviewed the triage vital signs and the nursing notes.  Pertinent labs & imaging results that were available during my care of the patient were reviewed by me and considered in my medical decision making (see chart for details).       Patient presents today with localized occipital pain and neck pain initially thought to be related to sleep position however has not resolved with home management with Tylenol.  Advised to trial tizanidine and if symptoms are truly of a muscular etiology this should resolve symptoms.  However ER precautions if symptoms worsen or if she develops any weakness, dizziness, changes in vision.  If symptoms remain unchanged after a few  doses of muscle relaxants I would recommend follow-up with primary care provider for further work-up and evaluation of focal symptoms.  Patient stable has no focal neurological symptoms.  Patient verbalized understanding and agreement with today  Final Clinical Impressions(s) / UC Diagnoses   Final diagnoses:  Occipital pain, right  Neck pain on right side     Discharge Instructions     I have prescribed you tizanidine you can take 1 to 2 tablets as needed for headache related pain take medication up to 3 times daily however medication can cause severe drowsiness therefore avoid driving while taking this medication. If your head pain or neck pain worsens and is associated with any weakness, dizziness or changes in vision go immediately to the emergency department.  If headache pain remains the same and is unchanged with tizanidine follow-up with primary care provider within the next 2 days to schedule a follow-up evaluation.    ED Prescriptions    Medication Sig Dispense Auth. Provider   tizanidine (ZANAFLEX) 2 MG capsule Take 1-2 capsules (2-4 mg total) by mouth 3 (three) times daily. 20 capsule [name], FNP     PDMP not reviewed this encounter.   [name], [place] 10/03/20 [phone]

## 2020-10-05 ENCOUNTER — Other Ambulatory Visit: Payer: Self-pay

## 2020-10-05 ENCOUNTER — Ambulatory Visit: Payer: Medicare PPO | Attending: Family Medicine

## 2020-10-05 DIAGNOSIS — R252 Cramp and spasm: Secondary | ICD-10-CM | POA: Diagnosis not present

## 2020-10-05 DIAGNOSIS — R293 Abnormal posture: Secondary | ICD-10-CM | POA: Diagnosis not present

## 2020-10-05 DIAGNOSIS — M542 Cervicalgia: Secondary | ICD-10-CM | POA: Diagnosis not present

## 2020-10-05 NOTE — Patient Instructions (Signed)
Access Code: [email or web address] URL: [email or web address] Date: 10/05/2020 Prepared by: Tresa Endo  Exercises Seated Cervical Flexion AROM - 3 x daily - 7 x weekly - 1 sets - 3 reps - 20 hold Seated Cervical Sidebending AROM - 3 x daily - 7 x weekly - 1 sets - 3 reps - 20 hold Seated Cervical Rotation AROM - 3 x daily - 7 x weekly - 1 sets - 3 reps - 20 hold Seated Correct Posture - 1 x daily - 7 x weekly - 3 sets - 10 reps Heat - 1 x daily - 7 x weekly - 3 sets - 10 reps Seated Scapular Retraction - 2 x daily - 7 x weekly - 1 sets - 10 reps Seated Shoulder Shrug Circles AROM Backward - 1 x daily - 7 x weekly - 3 sets - 10 reps

## 2020-10-05 NOTE — Therapy (Signed)
[facility] [address], [address] [place], [place], [address] Phone: [phone]   Fax:  [phone]  Physical Therapy Treatment  Patient Details  Name: Kristina [name] MRN: [id number] Date of Birth: 1945/09/28 Referring Provider (PT): Garth Bigness, MD   Encounter Date: 10/05/2020    Past Medical History:  Diagnosis Date  . Anxiety   . Asthma   . Diabetes (HCC)   . Diabetes mellitus without complication (HCC)   . Diverticulitis   . GERD (gastroesophageal reflux disease)   . HTN (hypertension)   . Hypercholesteremia   . Hyperlipidemia   . Hypertension   . Obesity   . Rosacea   . Thyroid disease   . Ulcerative colitis [facility])     Past Surgical History:  Procedure Laterality Date  . ACHILLES TENDON REPAIR    . APPENDECTOMY    . BIOPSY  10/16/2018   Procedure: BIOPSY;  Surgeon: Kathi Der, MD;  Location: [facility];  Service: Gastroenterology;;  . CESAREAN SECTION    . CHOLECYSTECTOMY    . FLEXIBLE SIGMOIDOSCOPY N/A 10/16/2018   Procedure: FLEXIBLE SIGMOIDOSCOPY;  Surgeon: Kathi Der, MD;  Location: [facility];  Service: Gastroenterology;  Laterality: N/A;  . hammertoe      There were no vitals filed for this visit.   Subjective Assessment - 10/05/20 1236    Subjective Pt presents to PT with recent onset of Rt sided neck/occiput pain that began ~1 week ago without cause. Pt did get new glasses prior to onset of pain.    Pt went to urgent care over the weekend and was prescribed with muscle relaxer.  Wakes at night and unable to get back to sleep.    Pertinent History DM, asthma, HTN    Diagnostic tests none    Patient Stated Goals reduce neck pain    Currently in Pain? Yes    Pain Score 4    up to 5-6/10   Pain Location Neck    Pain Orientation Right    Pain Descriptors / Indicators Aching    Pain Type Acute pain    Aggravating Factors  sleep at night (wakes at night and can't get back to  sleep), reading (tries to change position, uses a lap desk to sit book on)    Pain Relieving Factors Tylenol, muscle relaxer, heat              [facility] PT Assessment - 10/05/20 0001      Assessment   Medical Diagnosis cervicalgia    Referring Provider (PT) Garth Bigness, MD    Onset Date/Surgical Date 09/29/20    Hand Dominance Right    Prior Therapy none      Precautions   Precautions None      Restrictions   Weight Bearing Restrictions No      Balance Screen   Has the patient fallen in the past 6 months No    Has the patient had a decrease in activity level because of a fear of falling?  No    Is the patient reluctant to leave their home because of a fear of falling?  No      Home Environment   Living Environment Private residence    Living Arrangements Alone    Type of Home House      Prior Function   Level of Independence Independent    Leisure reading, scrapbooking/art      Cognition   Overall Cognitive Status Within Functional Limits for  tasks assessed      Observation/Other Assessments   Focus on Therapeutic Outcomes (FOTO)  51 (goal is 71)      Posture/Postural Control   Posture/Postural Control Postural limitations    Postural Limitations Rounded Shoulders;Forward head      ROM / Strength   AROM / PROM / Strength AROM;PROM;Strength      AROM   Overall AROM  Deficits    Overall AROM Comments Lt sidebending limited by 25% vs Rt.  All other cervical A/ROM is equal.  Limited by 25% in all directions due to postural deficits      Strength   Overall Strength Deficits    Overall Strength Comments Postural strength 4/5, UE strength 4+/5      Palpation   Spinal mobility reduced PA mobility in all directions with pain C3-5    Palpation comment tension and trigger points in Rt Upper trap >Lt, suboccipitals and cervical paraspinals      Transfers   Transfers Independent with all Transfers                                 PT  Education - 10/05/20 1302    Education Details Access Code: [email or web address]    Person(s) Educated Patient    Methods Explanation;Demonstration;Handout    Comprehension Returned demonstration;Verbalized understanding            PT Short Term Goals - 10/05/20 1242      PT SHORT TERM GOAL #1   Title be independent in initial HEP    Time 3    Period Weeks    Status New    Target Date 10/26/20      PT SHORT TERM GOAL #2   Title report a 30% reduction in Rt sided neck pain with daily tasks    Time 3    Period Weeks    Status New    Target Date 10/26/20             PT Long Term Goals - 10/05/20 1245      PT LONG TERM GOAL #1   Title be independent in advanced HEP    Time 8    Period Weeks    Status New    Target Date 11/16/20      PT LONG TERM GOAL #2   Title improve FOTO to > or = to 71 to improve function    Period Weeks    Status New    Target Date 11/16/20      PT LONG TERM GOAL #3   Title report a > or = to 70% reduction in Rt neck pain with daily tasks    Time 6    Period Weeks    Status New    Target Date 11/16/20      PT LONG TERM GOAL #4   Title sleep without limitation due to pain    Time 6    Period Weeks    Status New    Target Date 11/16/20      PT LONG TERM GOAL #5   Title demonstrate Rt=Lt cervical sidebending without increased pain    Time 6    Period Weeks    Status New    Target Date 11/16/20                 Plan - 10/05/20 1318    Clinical Impression Statement Pt is a Rt hand  dominant female who presents to PT with Rt sided neck and occipital pain that began ~1 week ago without cause.  Pt reports 3-6/10 pain and is waking every night with pain and unable to go back to sleep.  Pt with limited Lt cervical sidebending with Rt upper trap/neck pain reported.  All other cervical A/ROM is limited due to postural limitations and pt with Rt neck pain with cervical flexion.  Pt with forward head and rounded shoulders, reduced PA mobility in all  cervical segments with pain C3-5 and significant tension and trigger points in Rt>Lt upper traps, suboccipitals and cervical parapsinals.  Pt will benefit from skilled PT to address Rt sided neck pain, postural abnormality and tissue mobility.    Personal Factors and Comorbidities Comorbidity 1    Comorbidities DM, ashtma    Examination-Activity Limitations Sleep;Sit    Examination-Participation Restrictions Driving;Other    Stability/Clinical Decision Making Stable/Uncomplicated    Clinical Decision Making Low    Rehab Potential Excellent    PT Frequency 2x / week    PT Duration 6 weeks    PT Treatment/Interventions ADLs/Self Care Home Management;Cryotherapy;Electrical Stimulation;Moist Heat;Therapeutic activities;Therapeutic exercise;Neuromuscular re-education;Manual techniques;Patient/family education;Passive range of motion;Dry needling;Taping    PT Next Visit Plan dry needling to Rt upper trap, bil suboccipitals, bil multifidi,  review HEP, work on postural strength/alignment    PT Home Exercise Plan Access Code: [email or web address]    Consulted and Agree with Plan of Care Patient           Patient will benefit from skilled therapeutic intervention in order to improve the following deficits and impairments:  Decreased activity tolerance,Decreased strength,Postural dysfunction,Improper body mechanics,Impaired flexibility,Pain,Increased muscle spasms,Decreased range of motion  Visit Diagnosis: Cervicalgia - Plan: PT plan of care cert/re-cert  Cramp and spasm - Plan: PT plan of care cert/re-cert  Abnormal posture - Plan: PT plan of care cert/re-cert     Problem List Patient Active Problem List   Diagnosis Date Noted  . HLD (hyperlipidemia) 10/15/2018  . GIB (gastrointestinal bleeding) 10/15/2018  . Colitis 10/15/2018  . Hypokalemia   . Hypothyroidism   . Acute pyelonephritis 03/22/2018  . Pyelonephritis   . UTI (urinary tract infection) 03/21/2018  . IBS (irritable bowel syndrome)  03/21/2018  . Morbid obesity (HCC) 08/08/2015  . Acute hyponatremia 05/01/2015  . Gastroenteritis, acute 05/01/2015  . Diabetes mellitus type 2, controlled (HCC) 05/01/2015  . Asthma in remission 05/01/2015  . Diarrhea   . Cough variant asthma 04/19/2015  . Acute bronchitis 03/08/2015  . Hypothyroidism, adult 02/19/2015  . Dyspnea 02/18/2015  . Upper airway cough syndrome 01/07/2015  . Chest pain 09/15/2014  . Pre-syncope 12/08/2013  . PVC (premature ventricular contraction) 08/26/2013  . Rosacea   . Anxiety   . Diverticulitis   . Diabetes (HCC)   . Essential hypertension   . Obesity     Lorrene Reid, PT 10/05/20 1:23 PM  [facility] [address], [address] [place], [place], [address] Phone: [phone]   Fax:  [phone]  Name: Kristina [name] MRN: [id number] Date of Birth: [birth year]

## 2020-10-07 ENCOUNTER — Other Ambulatory Visit: Payer: Self-pay

## 2020-10-07 ENCOUNTER — Ambulatory Visit: Payer: Medicare PPO | Admitting: Physical Therapy

## 2020-10-07 DIAGNOSIS — R252 Cramp and spasm: Secondary | ICD-10-CM

## 2020-10-07 DIAGNOSIS — M542 Cervicalgia: Secondary | ICD-10-CM | POA: Diagnosis not present

## 2020-10-07 DIAGNOSIS — R293 Abnormal posture: Secondary | ICD-10-CM | POA: Diagnosis not present

## 2020-10-07 NOTE — Patient Instructions (Addendum)
Trigger Point Dry Needling  . What is Trigger Point Dry Needling (DN)? o DN is a physical therapy technique used to treat muscle pain and dysfunction. Specifically, DN helps deactivate muscle trigger points (muscle knots).  o A thin filiform needle is used to penetrate the skin and stimulate the underlying trigger point. The goal is for a local twitch response (LTR) to occur and for the trigger point to relax. No medication of any kind is injected during the procedure.   . What Does Trigger Point Dry Needling Feel Like?  o The procedure feels different for each individual patient. Some patients report that they do not actually feel the needle enter the skin and overall the process is not painful. Very mild bleeding may occur. However, many patients feel a deep cramping in the muscle in which the needle was inserted. This is the local twitch response.   Marland Kitchen How Will I feel after the treatment? o Soreness is normal, and the onset of soreness may not occur for a few hours. Typically this soreness does not last longer than two days.  o Bruising is uncommon, however; ice can be used to decrease any possible bruising.  o In rare cases feeling tired or nauseous after the treatment is normal. In addition, your symptoms may get worse before they get better, this period will typically not last longer than 24 hours.   . What Can I do After My Treatment? o Increase your hydration by drinking more water for the next 24 hours. o You may place ice or heat on the areas treated that have become sore, however, do not use heat on inflamed or bruised areas. Heat often brings more relief post needling. o You can continue your regular activities, but vigorous activity is not recommended initially after the treatment for 24 hours. o DN is best combined with other physical therapy such as strengthening, stretching, and other therapies.     Access Code: [email or web address] URL: [email or web address] Date:  10/07/2020 Prepared by: Lavinia Sharps  Exercises Seated Cervical Flexion AROM - 3 x daily - 7 x weekly - 1 sets - 3 reps - 20 hold Seated Cervical Sidebending AROM - 3 x daily - 7 x weekly - 1 sets - 3 reps - 20 hold Seated Cervical Rotation AROM - 3 x daily - 7 x weekly - 1 sets - 3 reps - 20 hold Seated Correct Posture - 1 x daily - 7 x weekly - 3 sets - 10 reps Heat - 1 x daily - 7 x weekly - 3 sets - 10 reps Seated Scapular Retraction - 2 x daily - 7 x weekly - 1 sets - 10 reps Seated Shoulder Shrug Circles AROM Backward - 1 x daily - 7 x weekly - 3 sets - 10 reps Beginner Head Nod - 1 x daily - 7 x weekly - 1 sets - 10 reps Seated Head Nod - 1 x daily - 7 x weekly - 1 sets - 10 reps

## 2020-10-07 NOTE — Therapy (Signed)
[facility] [address] [place], [place], [address] Phone: [phone]   Fax:  [phone]  Physical Therapy Treatment  Patient Details  Name: Kristina [name] MRN: [id number] Date of Birth: 05/08/1946 Referring Provider (PT): Garth Bigness, MD   Encounter Date: 10/07/2020   PT End of Session - 10/07/20 1002    Visit Number 2    Date for PT Re-Evaluation 11/16/20    Authorization Type [employer]    PT Start Time 0930    PT Stop Time 1015    PT Time Calculation (min) 45 min    Activity Tolerance Patient tolerated treatment well           Past Medical History:  Diagnosis Date  . Anxiety   . Asthma   . Diabetes (HCC)   . Diabetes mellitus without complication (HCC)   . Diverticulitis   . GERD (gastroesophageal reflux disease)   . HTN (hypertension)   . Hypercholesteremia   . Hyperlipidemia   . Hypertension   . Obesity   . Rosacea   . Thyroid disease   . Ulcerative colitis [facility])     Past Surgical History:  Procedure Laterality Date  . ACHILLES TENDON REPAIR    . APPENDECTOMY    . BIOPSY  10/16/2018   Procedure: BIOPSY;  Surgeon: Kathi Der, MD;  Location: [facility];  Service: Gastroenterology;;  . CESAREAN SECTION    . CHOLECYSTECTOMY    . FLEXIBLE SIGMOIDOSCOPY N/A 10/16/2018   Procedure: FLEXIBLE SIGMOIDOSCOPY;  Surgeon: Kathi Der, MD;  Location: [facility];  Service: Gastroenterology;  Laterality: N/A;  . hammertoe      There were no vitals filed for this visit.   Subjective Assessment - 10/07/20 0927    Subjective I'm continuing the muscle relaxer.  The pain was worse earlier this morning.  Painful with movement.    Pertinent History DM, asthma, HTN    Currently in Pain? Yes    Pain Score 5     Pain Location Neck    Pain Orientation Right    Pain Type Acute pain                             [facility] Adult PT Treatment/Exercise - 10/07/20 0001       Neck Exercises: Seated   Other Seated Exercise review of initial HEP    Other Seated Exercise cervical nods 10x      Neck Exercises: Supine   Capital Flexion 10 reps      Moist Heat Therapy   Number Minutes Moist Heat 3 Minutes    Moist Heat Location Cervical      Manual Therapy   Soft tissue mobilization right suboccipitals, uppre trap, cervical praspinals    Manual Traction 3x 20 sec    Muscle Energy Technique right contract relax upper traps 5 sec hold 3x            Trigger Point Dry Needling - 10/07/20 0001    Consent Given? Yes    Education Handout Provided Yes    Muscles Treated Head and Neck Upper trapezius;Suboccipitals;Levator scapulae    Other Dry Needling right only    Upper Trapezius Response Twitch reponse elicited;Palpable increased muscle length    Suboccipitals Response Palpable increased muscle length    Levator Scapulae Response Palpable increased muscle length                PT Education -  10/07/20 1002    Education Details dry needling after care;  supine and seated cervical nods    Person(s) Educated Patient    Methods Explanation;Demonstration;Handout    Comprehension Returned demonstration;Verbalized understanding            PT Short Term Goals - 10/05/20 1242      PT SHORT TERM GOAL #1   Title be independent in initial HEP    Time 3    Period Weeks    Status New    Target Date 10/26/20      PT SHORT TERM GOAL #2   Title report a 30% reduction in Rt sided neck pain with daily tasks    Time 3    Period Weeks    Status New    Target Date 10/26/20             PT Long Term Goals - 10/05/20 1245      PT LONG TERM GOAL #1   Title be independent in advanced HEP    Time 8    Period Weeks    Status New    Target Date 11/16/20      PT LONG TERM GOAL #2   Title improve FOTO to > or = to 71 to improve function    Period Weeks    Status New    Target Date 11/16/20      PT LONG TERM GOAL #3   Title report a > or = to 70%  reduction in Rt neck pain with daily tasks    Time 6    Period Weeks    Status New    Target Date 11/16/20      PT LONG TERM GOAL #4   Title sleep without limitation due to pain    Time 6    Period Weeks    Status New    Target Date 11/16/20      PT LONG TERM GOAL #5   Title demonstrate Rt=Lt cervical sidebending without increased pain    Time 6    Period Weeks    Status New    Target Date 11/16/20                 Plan - 10/07/20 0959    Clinical Impression Statement The patient has several tender points in right upper trap and suboccipitals.  She is receptive to trying dry needling with a positive outcome following including improved rotation and sidebending ROM.  Much improved soft tissue length and decreased soft tissue mobility.  She is compliant with her initial HEP which will improve her long term outcomes.  Therapist closely monitoring response throughout treatment session.    Rehab Potential Excellent    PT Frequency 2x / week    PT Duration 6 weeks    PT Treatment/Interventions ADLs/Self Care Home Management;Cryotherapy;Electrical Stimulation;Moist Heat;Therapeutic activities;Therapeutic exercise;Neuromuscular re-education;Manual techniques;Patient/family education;Passive range of motion;Dry needling;Taping    PT Next Visit Plan assess response to dry needling to Rt upper trap,  suboccipitals,  multifidi,  review HEP, work on postural strength/alignment; add band ex    PT Home Exercise Plan Access Code: [email or web address]           Patient will benefit from skilled therapeutic intervention in order to improve the following deficits and impairments:  Decreased activity tolerance,Decreased strength,Postural dysfunction,Improper body mechanics,Impaired flexibility,Pain,Increased muscle spasms,Decreased range of motion  Visit Diagnosis: Cervicalgia  Cramp and spasm  Abnormal posture     Problem List Patient Active Problem List  Diagnosis Date Noted  . HLD  (hyperlipidemia) 10/15/2018  . GIB (gastrointestinal bleeding) 10/15/2018  . Colitis 10/15/2018  . Hypokalemia   . Hypothyroidism   . Acute pyelonephritis 03/22/2018  . Pyelonephritis   . UTI (urinary tract infection) 03/21/2018  . IBS (irritable bowel syndrome) 03/21/2018  . Morbid obesity (HCC) 08/08/2015  . Acute hyponatremia 05/01/2015  . Gastroenteritis, acute 05/01/2015  . Diabetes mellitus type 2, controlled (HCC) 05/01/2015  . Asthma in remission 05/01/2015  . Diarrhea   . Cough variant asthma 04/19/2015  . Acute bronchitis 03/08/2015  . Hypothyroidism, adult 02/19/2015  . Dyspnea 02/18/2015  . Upper airway cough syndrome 01/07/2015  . Chest pain 09/15/2014  . Pre-syncope 12/08/2013  . PVC (premature ventricular contraction) 08/26/2013  . Rosacea   . Anxiety   . Diverticulitis   . Diabetes (HCC)   . Essential hypertension   . Obesity    Lavinia Sharps, PT 10/07/20 12:26 PM Phone: [phone] Fax: [phone] Vivien Presto 10/07/2020, 12:26 PM  [facility] [address] [place], [place], [address] Phone: [phone]   Fax:  [phone]  Name: Kristina [name] MRN: [id number] Date of Birth: [birth year]

## 2020-10-11 ENCOUNTER — Ambulatory Visit: Payer: Medicare PPO | Admitting: Physical Therapy

## 2020-10-11 ENCOUNTER — Other Ambulatory Visit: Payer: Self-pay

## 2020-10-11 ENCOUNTER — Encounter: Payer: Self-pay | Admitting: Physical Therapy

## 2020-10-11 DIAGNOSIS — R293 Abnormal posture: Secondary | ICD-10-CM | POA: Diagnosis not present

## 2020-10-11 DIAGNOSIS — R252 Cramp and spasm: Secondary | ICD-10-CM | POA: Diagnosis not present

## 2020-10-11 DIAGNOSIS — M542 Cervicalgia: Secondary | ICD-10-CM

## 2020-10-11 NOTE — Therapy (Signed)
[facility] [address], [address] [place], [place], [address] Phone: [phone]   Fax:  [phone]  Physical Therapy Treatment  Patient Details  Name: Kristina [name] MRN: [id number] Date of Birth: 1946/05/17 Referring Provider (PT): Garth Bigness, MD   Encounter Date: 10/11/2020   PT End of Session - 10/11/20 1409    Visit Number 3    Date for PT Re-Evaluation 11/16/20    Authorization Type Cohere    PT Start Time 1402    PT Stop Time 1443    PT Time Calculation (min) 41 min    Activity Tolerance Patient tolerated treatment well    Behavior During Therapy [facility] for tasks assessed/performed           Past Medical History:  Diagnosis Date  . Anxiety   . Asthma   . Diabetes (HCC)   . Diabetes mellitus without complication (HCC)   . Diverticulitis   . GERD (gastroesophageal reflux disease)   . HTN (hypertension)   . Hypercholesteremia   . Hyperlipidemia   . Hypertension   . Obesity   . Rosacea   . Thyroid disease   . Ulcerative colitis [facility])     Past Surgical History:  Procedure Laterality Date  . ACHILLES TENDON REPAIR    . APPENDECTOMY    . BIOPSY  10/16/2018   Procedure: BIOPSY;  Surgeon: Kathi Der, MD;  Location: [facility];  Service: Gastroenterology;;  . CESAREAN SECTION    . CHOLECYSTECTOMY    . FLEXIBLE SIGMOIDOSCOPY N/A 10/16/2018   Procedure: FLEXIBLE SIGMOIDOSCOPY;  Surgeon: Kathi Der, MD;  Location: [facility];  Service: Gastroenterology;  Laterality: N/A;  . hammertoe      There were no vitals filed for this visit.   Subjective Assessment - 10/11/20 1405    Subjective Patient reports that her neck feels so much better. States that she has some residual Lt and Rt sided soreness.    Pertinent History DM, asthma, HTN    Diagnostic tests none    Patient Stated Goals reduce neck pain    Pain Score 2     Pain Location Neck    Pain Orientation Right;Left                              [facility] Adult PT Treatment/Exercise - 10/11/20 0001      Neck Exercises: Seated   Other Seated Exercise cervical nods 10x in supine this date      Neck Exercises: Supine   Neck Retraction 10 reps;3 secs    Neck Retraction Limitations with slight chin tuck; maximal verbal cuing for proper performance    Cervical Rotation Both;10 reps    Other Supine Exercise scapular squeezes 10x3s hold      Moist Heat Therapy   Number Minutes Moist Heat 5 Minutes    Moist Heat Location Cervical      Manual Therapy   Manual Therapy Soft tissue mobilization    Manual therapy comments palpation of tissue before and during dry needling    Soft tissue mobilization STM to bil rhombois, upper trap, levator scap, cervical paraspinals for decreased pain and improved tissue mobility            Trigger Point Dry Needling - 10/11/20 0001    Consent Given? Yes    Education Handout Provided Previously provided    Muscles Treated Head and Neck Upper trapezius;Levator scapulae    Other  Dry Needling left only    Upper Trapezius Response Twitch reponse elicited;Palpable increased muscle length    Levator Scapulae Response Twitch response elicited;Palpable increased muscle length                  PT Short Term Goals - 10/11/20 1454      PT SHORT TERM GOAL #2   Title report a 30% reduction in Rt sided neck pain with daily tasks    Time 3    Period Weeks    Status Achieved   2/10   Target Date 10/26/20             PT Long Term Goals - 10/05/20 1245      PT LONG TERM GOAL #1   Title be independent in advanced HEP    Time 8    Period Weeks    Status New    Target Date 11/16/20      PT LONG TERM GOAL #2   Title improve FOTO to > or = to 71 to improve function    Period Weeks    Status New    Target Date 11/16/20      PT LONG TERM GOAL #3   Title report a > or = to 70% reduction in Rt neck pain with daily tasks    Time 6    Period Weeks    Status  New    Target Date 11/16/20      PT LONG TERM GOAL #4   Title sleep without limitation due to pain    Time 6    Period Weeks    Status New    Target Date 11/16/20      PT LONG TERM GOAL #5   Title demonstrate Rt=Lt cervical sidebending without increased pain    Time 6    Period Weeks    Status New    Target Date 11/16/20                 Plan - 10/11/20 1450    Clinical Impression Statement Patient reporting signficant improvement of symptoms following last session and rates pain to be 2/10. Stating that she has some Lt sided discomfort at start of session. Noticed that she was able to transition supine to sit this morning without pain. Remains compiant with HEP. Patient reporting continued relief at end of session. Requiring max verbal cuing for proper performance of cervical retraction indicating impaired proprioceptive awareness of cervical musculature. Would benefit from continued skilled intervention to address impairments for decreased pain.    Personal Factors and Comorbidities Comorbidity 1    Comorbidities DM, ashtma    Examination-Activity Limitations Sleep;Sit    Examination-Participation Restrictions Driving;Other    Rehab Potential Excellent    PT Frequency 2x / week    PT Duration 6 weeks    PT Treatment/Interventions ADLs/Self Care Home Management;Cryotherapy;Electrical Stimulation;Moist Heat;Therapeutic activities;Therapeutic exercise;Neuromuscular re-education;Manual techniques;Patient/family education;Passive range of motion;Dry needling;Taping    PT Next Visit Plan assess response to DN Lt upper trap and levator scap, continue postural strength adding resistance to patient tolerance    PT Home Exercise Plan Access Code: [email or web address]    Consulted and Agree with Plan of Care Patient           Patient will benefit from skilled therapeutic intervention in order to improve the following deficits and impairments:  Decreased activity tolerance,Decreased  strength,Postural dysfunction,Improper body mechanics,Impaired flexibility,Pain,Increased muscle spasms,Decreased range of motion  Visit Diagnosis: Cervicalgia  Cramp and spasm  Abnormal posture     Problem List Patient Active Problem List   Diagnosis Date Noted  . HLD (hyperlipidemia) 10/15/2018  . GIB (gastrointestinal bleeding) 10/15/2018  . Colitis 10/15/2018  . Hypokalemia   . Hypothyroidism   . Acute pyelonephritis 03/22/2018  . Pyelonephritis   . UTI (urinary tract infection) 03/21/2018  . IBS (irritable bowel syndrome) 03/21/2018  . Morbid obesity (HCC) 08/08/2015  . Acute hyponatremia 05/01/2015  . Gastroenteritis, acute 05/01/2015  . Diabetes mellitus type 2, controlled (HCC) 05/01/2015  . Asthma in remission 05/01/2015  . Diarrhea   . Cough variant asthma 04/19/2015  . Acute bronchitis 03/08/2015  . Hypothyroidism, adult 02/19/2015  . Dyspnea 02/18/2015  . Upper airway cough syndrome 01/07/2015  . Chest pain 09/15/2014  . Pre-syncope 12/08/2013  . PVC (premature ventricular contraction) 08/26/2013  . Rosacea   . Anxiety   . Diverticulitis   . Diabetes (HCC)   . Essential hypertension   . Obesity    Anabel Halon PT, DPT  10/11/20 2:58 PM  [facility] [address] [place], [place], [address] Phone: [phone]   Fax:  [phone]  Name: Kristina [name] MRN: [id number] Date of Birth: Jul 20, 1946

## 2020-10-12 DIAGNOSIS — M1712 Unilateral primary osteoarthritis, left knee: Secondary | ICD-10-CM | POA: Diagnosis not present

## 2020-10-13 ENCOUNTER — Encounter: Payer: Self-pay | Admitting: Physical Therapy

## 2020-10-13 ENCOUNTER — Other Ambulatory Visit: Payer: Self-pay

## 2020-10-13 ENCOUNTER — Ambulatory Visit: Payer: Medicare PPO | Admitting: Physical Therapy

## 2020-10-13 DIAGNOSIS — M542 Cervicalgia: Secondary | ICD-10-CM | POA: Diagnosis not present

## 2020-10-13 DIAGNOSIS — R252 Cramp and spasm: Secondary | ICD-10-CM | POA: Diagnosis not present

## 2020-10-13 DIAGNOSIS — R293 Abnormal posture: Secondary | ICD-10-CM | POA: Diagnosis not present

## 2020-10-13 NOTE — Therapy (Signed)
[facility] [address] [place], [place], [address] Phone: [phone]   Fax:  [phone]  Physical Therapy Treatment  Patient Details  Name: Kristina [name] MRN: [id number] Date of Birth: [birth year] Referring Provider (PT): Garth Bigness, MD   Encounter Date: 10/13/2020   PT End of Session - 10/13/20 1225    Visit Number 4    Date for PT Re-Evaluation 11/16/20    Authorization Type [employer]here    PT Start Time 1225    PT Stop Time 1300    PT Time Calculation (min) 35 min    Activity Tolerance Patient tolerated treatment well    Behavior During Therapy [facility] for tasks assessed/performed           Past Medical History:  Diagnosis Date  . Anxiety   . Asthma   . Diabetes (HCC)   . Diabetes mellitus without complication (HCC)   . Diverticulitis   . GERD (gastroesophageal reflux disease)   . HTN (hypertension)   . Hypercholesteremia   . Hyperlipidemia   . Hypertension   . Obesity   . Rosacea   . Thyroid disease   . Ulcerative colitis [facility])     Past Surgical History:  Procedure Laterality Date  . ACHILLES TENDON REPAIR    . APPENDECTOMY    . BIOPSY  10/16/2018   Procedure: BIOPSY;  Surgeon: Kathi Der, MD;  [facility];  Service: Gastroenterology;;  . CESAREAN SECTION    . CHOLECYSTECTOMY    . FLEXIBLE SIGMOIDOSCOPY N/A 10/16/2018   Procedure: FLEXIBLE SIGMOIDOSCOPY;  Surgeon: Kathi Der, MD;  [facility];  Service: Gastroenterology;  Laterality: N/A;  . hammertoe      There were no vitals filed for this visit.   Subjective Assessment - 10/13/20 1322    Subjective Patient reports pain is resolved. Feels she can discharge this session or the next.    Pertinent History DM, asthma, HTN    Patient Stated Goals reduce neck pain    Currently in Pain? No/denies    Multiple Pain Sites No                             [facility] Adult PT  Treatment/Exercise - 10/13/20 0001      Exercises   Other Exercises  standing thoracic rotation self mob at wall x10 bil.   seated thoracic ext self mob 10x3s hold     Neck Exercises: Supine   Neck Retraction 10 reps;3 secs    Neck Retraction Limitations with slight chin tuck; maximal verbal cuing for proper performance    Capital Flexion 10 reps    Capital Flexion Limitations VC to maintain head contact with pillow      Shoulder Exercises: Stretch   Other Shoulder Stretches seated upper trap stretch x20s bil.    Other Shoulder Stretches seated levator scap stretch x20s bil.                  PT Education - 10/13/20 1322    Education Details Safe performance and benefits of thoracic self mobilizations    Person(s) Educated Patient    Methods Explanation;Demonstration;Handout    Comprehension Verbalized understanding;Returned demonstration            PT Short Term Goals - 10/13/20 1600      PT SHORT TERM GOAL #1   Title be independent in initial HEP    Time 3  Period Weeks    Status Achieved    Target Date 10/26/20             PT Long Term Goals - 10/13/20 1600      PT LONG TERM GOAL #3   Title report a > or = to 70% reduction in Rt neck pain with daily tasks    Time 6    Period Weeks    Status Achieved    Target Date 11/16/20      PT LONG TERM GOAL #5   Title demonstrate Rt=Lt cervical sidebending without increased pain    Time 6    Period Weeks    Status Achieved    Target Date 11/16/20                 Plan - 10/13/20 1324    Clinical Impression Statement Patient reporting no pain at start of session. Continues to require increased cuing for proper recruitment of upper cervical flexors and cervical retractors indicating impaired proprioceptive awareness of cervical spine. Requiring manual cuing to isolate cervical side bending from rotation. Would benefit from continued skilled intervention to address impairments for decreased pain and  improved activity tolerance.    Personal Factors and Comorbidities Comorbidity 1    Comorbidities DM, ashtma    Examination-Activity Limitations Sleep;Sit    Examination-Participation Restrictions Driving;Other    Rehab Potential Excellent    PT Frequency 2x / week    PT Duration 6 weeks    PT Treatment/Interventions ADLs/Self Care Home Management;Cryotherapy;Electrical Stimulation;Moist Heat;Therapeutic activities;Therapeutic exercise;Neuromuscular re-education;Manual techniques;Patient/family education;Passive range of motion;Dry needling;Taping    PT Next Visit Plan assess response to HEP additions; anticipate discharge    PT Home Exercise Plan Access Code: [email or web address]    Consulted and Agree with Plan of Care Patient           Patient will benefit from skilled therapeutic intervention in order to improve the following deficits and impairments:  Decreased activity tolerance,Decreased strength,Postural dysfunction,Improper body mechanics,Impaired flexibility,Pain,Increased muscle spasms,Decreased range of motion  Visit Diagnosis: Cervicalgia  Cramp and spasm  Abnormal posture     Problem List Patient Active Problem List   Diagnosis Date Noted  . HLD (hyperlipidemia) 10/15/2018  . GIB (gastrointestinal bleeding) 10/15/2018  . Colitis 10/15/2018  . Hypokalemia   . Hypothyroidism   . Acute pyelonephritis 03/22/2018  . Pyelonephritis   . UTI (urinary tract infection) 03/21/2018  . IBS (irritable bowel syndrome) 03/21/2018  . Morbid obesity (HCC) 08/08/2015  . Acute hyponatremia 05/01/2015  . Gastroenteritis, acute 05/01/2015  . Diabetes mellitus type 2, controlled (HCC) 05/01/2015  . Asthma in remission 05/01/2015  . Diarrhea   . Cough variant asthma 04/19/2015  . Acute bronchitis 03/08/2015  . Hypothyroidism, adult 02/19/2015  . Dyspnea 02/18/2015  . Upper airway cough syndrome 01/07/2015  . Chest pain 09/15/2014  . Pre-syncope 12/08/2013  . PVC (premature ventricular  contraction) 08/26/2013  . Rosacea   . Anxiety   . Diverticulitis   . Diabetes (HCC)   . Essential hypertension   . Obesity    Anabel Halon PT, DPT  10/13/20 4:02 PM  [facility] [address] [place], [place], [address] Phone: [phone]   Fax:  [phone]  Name: Kristina [name] MRN: [id number] Date of Birth: Dec 22, 1945

## 2020-10-18 ENCOUNTER — Ambulatory Visit: Payer: Medicare PPO | Admitting: Physical Therapy

## 2020-10-18 ENCOUNTER — Encounter: Payer: Self-pay | Admitting: Physical Therapy

## 2020-10-18 ENCOUNTER — Other Ambulatory Visit: Payer: Self-pay

## 2020-10-18 DIAGNOSIS — R293 Abnormal posture: Secondary | ICD-10-CM | POA: Diagnosis not present

## 2020-10-18 DIAGNOSIS — M542 Cervicalgia: Secondary | ICD-10-CM | POA: Diagnosis not present

## 2020-10-18 DIAGNOSIS — R252 Cramp and spasm: Secondary | ICD-10-CM

## 2020-10-18 NOTE — Patient Instructions (Signed)
Neck Retraction - 1 x daily - 7 x weekly - 1 sets - 10 reps Side Lying Open Book - 1 x daily - 7 x weekly - 1 sets - 5 reps

## 2020-10-18 NOTE — Therapy (Signed)
[facility] [address] [place], [place], [address] Phone: [phone]   Fax:  [phone]  Physical Therapy Treatment  Patient Details  Name: PAILYN BELLEVUE MRN: [id number] Date of Birth: [birth year] Referring Provider (PT): Sela Hilding, MD   Encounter Date: 10/18/2020   PT End of Session - 10/18/20 1402    Visit Number 5    Date for PT Re-Evaluation 11/16/20    Authorization Type Cohere    PT Start Time 1358    PT Stop Time 1420    PT Time Calculation (min) 22 min    Activity Tolerance Patient tolerated treatment well    Behavior During Therapy [facility] for tasks assessed/performed           Past Medical History:  Diagnosis Date  . Anxiety   . Asthma   . Diabetes ([place])   . Diabetes mellitus without complication ([place])   . Diverticulitis   . GERD (gastroesophageal reflux disease)   . HTN (hypertension)   . Hypercholesteremia   . Hyperlipidemia   . Hypertension   . Obesity   . Rosacea   . Thyroid disease   . Ulcerative colitis [facility])     Past Surgical History:  Procedure Laterality Date  . ACHILLES TENDON REPAIR    . APPENDECTOMY    . BIOPSY  10/16/2018   Procedure: BIOPSY;  Surgeon: Otis Brace, MD;  Location: [place];  Service: Gastroenterology;;  . CESAREAN SECTION    . CHOLECYSTECTOMY    . FLEXIBLE SIGMOIDOSCOPY N/A 10/16/2018   Procedure: FLEXIBLE SIGMOIDOSCOPY;  Surgeon: Otis Brace, MD;  Location: [place];  Service: Gastroenterology;  Laterality: N/A;  . hammertoe      There were no vitals filed for this visit.   Subjective Assessment - 10/18/20 1402    Subjective Patient reports that she continues to notice resolution of symptoms. States that she has no noted limitations    Pertinent History DM, asthma, HTN    Currently in Pain? No/denies    Multiple Pain Sites No              [facility] PT Assessment - 10/18/20 0001      Assessment   Medical Diagnosis  cervicalgia    Referring Provider (PT) Sela Hilding, MD    Onset Date/Surgical Date 09/29/20    Hand Dominance Right    Prior Therapy none      Precautions   Precautions None      Restrictions   Weight Bearing Restrictions No      Balance Screen   Has the patient fallen in the past 6 months No    Has the patient had a decrease in activity level because of a fear of falling?  No    Is the patient reluctant to leave their home because of a fear of falling?  No      Home Environment   Living Environment Private residence    Living Arrangements Alone    Type of [place]      Prior Function   Level of [place] reading, scrapbooking/art      Cognition   Overall Cognitive Status Within Functional Limits for tasks assessed      Observation/Other Assessments   Focus on Therapeutic Outcomes (FOTO)  86      Posture/Postural Control   Posture/Postural Control Postural limitations    Postural Limitations Rounded Shoulders;Forward head      AROM   Overall AROM  Comments Lt sidebending and Rt sidebending equal and pain free                         [facility] Adult PT Treatment/Exercise - 10/18/20 0001      Neck Exercises: Seated   Neck Retraction 10 reps    Neck Retraction Limitations VC and TC to avoid trunk extension and isolate cervical retraction    Cervical Rotation 10 reps;Both    Other Seated Exercise cervical nods 20x      Neck Exercises: Supine   Other Supine Exercise scapular squeezes 10x3s hold   in sitting                 PT Education - 10/18/20 1432    Education Details added sidelying thoracic rotation and seated cervical retraction to HEP    Person(s) Educated Patient    Methods Explanation;Demonstration;Tactile cues;Verbal cues;Handout    Comprehension Verbalized understanding;Returned demonstration;Verbal cues required;Tactile cues required            PT Short Term Goals - 10/18/20 1358      PT  SHORT TERM GOAL #1   Status Achieved      PT SHORT TERM GOAL #2   Status Achieved             PT Long Term Goals - 10/18/20 1358      PT LONG TERM GOAL #1   Status Achieved      PT LONG TERM GOAL #3   Status Achieved      PT LONG TERM GOAL #4   Status Achieved      PT LONG TERM GOAL #5   Status Achieved                 Plan - 10/18/20 1433    Clinical Impression Statement Patient is a 75 y/o female presenting due to cervical pain. Patient reports 0/10 pain this date and feels that symptoms are 100% resolved. She demonstrates equal AROM when performing Rt and Lt sidebending. Patient states that she has slept through the night without sleep disturbances due to pain. FOTO score improved from 51 to 86. Patient continues to have increased difficulty recruiting cervical and scapular retractors but safe to conitnue to address this with HEP. No further skilled needs apparent.    Personal Factors and Comorbidities Comorbidity 2    Comorbidities DM, asthma    Examination-Activity Limitations Sleep;Sit    Examination-Participation Restrictions Driving;Other    Rehab Potential Excellent    PT Frequency 2x / week    PT Duration 6 weeks    PT Treatment/Interventions ADLs/Self Care Home Management;Cryotherapy;Electrical Stimulation;Moist Heat;Therapeutic activities;Therapeutic exercise;Neuromuscular re-education;Manual techniques;Patient/family education;Passive range of motion;Dry needling;Taping    PT Home Exercise Plan Access Code: [email or web address]    Consulted and Agree with Plan of Care Patient           Patient will benefit from skilled therapeutic intervention in order to improve the following deficits and impairments:  Decreased activity tolerance,Decreased strength,Postural dysfunction,Improper body mechanics,Impaired flexibility,Pain,Increased muscle spasms,Decreased range of motion  Visit Diagnosis: Cervicalgia  Cramp and spasm  Abnormal posture     Problem  List Patient Active Problem List   Diagnosis Date Noted  . HLD (hyperlipidemia) 10/15/2018  . GIB (gastrointestinal bleeding) 10/15/2018  . Colitis 10/15/2018  . Hypokalemia   . Hypothyroidism   . Acute pyelonephritis 03/22/2018  . Pyelonephritis   . UTI (urinary tract infection) 03/21/2018  . IBS (irritable bowel syndrome) 03/21/2018  .  Morbid obesity ([place]) 08/08/2015  . Acute hyponatremia 05/01/2015  . Gastroenteritis, acute 05/01/2015  . Diabetes mellitus type 2, controlled ([place]) 05/01/2015  . Asthma in remission 05/01/2015  . Diarrhea   . Cough variant asthma 04/19/2015  . Acute bronchitis 03/08/2015  . Hypothyroidism, adult 02/19/2015  . Dyspnea 02/18/2015  . Upper airway cough syndrome 01/07/2015  . Chest pain 09/15/2014  . Pre-syncope 12/08/2013  . PVC (premature ventricular contraction) 08/26/2013  . Rosacea   . Anxiety   . Diverticulitis   . Diabetes ([place])   . Essential hypertension   . Obesity    PHYSICAL THERAPY DISCHARGE SUMMARY  Visits from Start of Care: 5  Current functional level related to goals / functional outcomes: See above   Remaining deficits: See above   Education / Equipment: See above  Plan: Patient agrees to discharge.  Patient goals were met. Patient is being discharged due to meeting the stated rehab goals.  ?????        Everardo All PT, DPT  10/18/20 2:46 PM   [facility] [address] [place], [place], [address] Phone: [phone]   Fax:  [phone]  Name: CADEE AGRO MRN: [id number] Date of Birth: [birth year]

## 2020-10-20 ENCOUNTER — Encounter: Payer: Medicare PPO | Admitting: Physical Therapy

## 2020-10-25 ENCOUNTER — Encounter: Payer: Medicare PPO | Admitting: Physical Therapy

## 2020-10-26 ENCOUNTER — Other Ambulatory Visit: Payer: Self-pay | Admitting: Cardiovascular Disease

## 2020-10-26 DIAGNOSIS — K580 Irritable bowel syndrome with diarrhea: Secondary | ICD-10-CM | POA: Diagnosis not present

## 2020-10-27 ENCOUNTER — Encounter: Payer: Medicare PPO | Admitting: Physical Therapy

## 2020-11-01 ENCOUNTER — Encounter: Payer: Medicare PPO | Admitting: Physical Therapy

## 2020-11-03 ENCOUNTER — Encounter: Payer: Medicare PPO | Admitting: Physical Therapy

## 2020-11-07 DIAGNOSIS — E785 Hyperlipidemia, unspecified: Secondary | ICD-10-CM | POA: Diagnosis not present

## 2020-11-07 DIAGNOSIS — I10 Essential (primary) hypertension: Secondary | ICD-10-CM | POA: Diagnosis not present

## 2020-11-07 DIAGNOSIS — D692 Other nonthrombocytopenic purpura: Secondary | ICD-10-CM | POA: Diagnosis not present

## 2020-11-07 DIAGNOSIS — E1165 Type 2 diabetes mellitus with hyperglycemia: Secondary | ICD-10-CM | POA: Diagnosis not present

## 2020-12-21 DIAGNOSIS — K529 Noninfective gastroenteritis and colitis, unspecified: Secondary | ICD-10-CM | POA: Diagnosis not present

## 2020-12-21 DIAGNOSIS — K580 Irritable bowel syndrome with diarrhea: Secondary | ICD-10-CM | POA: Diagnosis not present

## 2021-01-05 DIAGNOSIS — E119 Type 2 diabetes mellitus without complications: Secondary | ICD-10-CM | POA: Diagnosis not present

## 2021-01-05 DIAGNOSIS — I10 Essential (primary) hypertension: Secondary | ICD-10-CM | POA: Diagnosis not present

## 2021-01-05 DIAGNOSIS — Z91013 Allergy to seafood: Secondary | ICD-10-CM | POA: Diagnosis not present

## 2021-01-05 DIAGNOSIS — Z881 Allergy status to other antibiotic agents status: Secondary | ICD-10-CM | POA: Diagnosis not present

## 2021-01-05 DIAGNOSIS — K51911 Ulcerative colitis, unspecified with rectal bleeding: Secondary | ICD-10-CM | POA: Diagnosis not present

## 2021-01-16 DIAGNOSIS — K625 Hemorrhage of anus and rectum: Secondary | ICD-10-CM | POA: Diagnosis not present

## 2021-01-16 DIAGNOSIS — K529 Noninfective gastroenteritis and colitis, unspecified: Secondary | ICD-10-CM | POA: Diagnosis not present

## 2021-01-19 DIAGNOSIS — M25562 Pain in left knee: Secondary | ICD-10-CM | POA: Diagnosis not present

## 2021-02-22 DIAGNOSIS — E1169 Type 2 diabetes mellitus with other specified complication: Secondary | ICD-10-CM | POA: Diagnosis not present

## 2021-02-22 DIAGNOSIS — I10 Essential (primary) hypertension: Secondary | ICD-10-CM | POA: Diagnosis not present

## 2021-02-22 DIAGNOSIS — E785 Hyperlipidemia, unspecified: Secondary | ICD-10-CM | POA: Diagnosis not present

## 2021-02-22 DIAGNOSIS — D500 Iron deficiency anemia secondary to blood loss (chronic): Secondary | ICD-10-CM | POA: Diagnosis not present

## 2021-02-22 DIAGNOSIS — E039 Hypothyroidism, unspecified: Secondary | ICD-10-CM | POA: Diagnosis not present

## 2021-02-24 DIAGNOSIS — E039 Hypothyroidism, unspecified: Secondary | ICD-10-CM | POA: Diagnosis not present

## 2021-02-24 DIAGNOSIS — I10 Essential (primary) hypertension: Secondary | ICD-10-CM | POA: Diagnosis not present

## 2021-02-24 DIAGNOSIS — E1169 Type 2 diabetes mellitus with other specified complication: Secondary | ICD-10-CM | POA: Diagnosis not present

## 2021-02-24 DIAGNOSIS — Z0000 Encounter for general adult medical examination without abnormal findings: Secondary | ICD-10-CM | POA: Diagnosis not present

## 2021-02-24 DIAGNOSIS — R399 Unspecified symptoms and signs involving the genitourinary system: Secondary | ICD-10-CM | POA: Diagnosis not present

## 2021-02-24 DIAGNOSIS — E785 Hyperlipidemia, unspecified: Secondary | ICD-10-CM | POA: Diagnosis not present

## 2021-03-08 DIAGNOSIS — K529 Noninfective gastroenteritis and colitis, unspecified: Secondary | ICD-10-CM | POA: Diagnosis not present

## 2021-04-07 DIAGNOSIS — I10 Essential (primary) hypertension: Secondary | ICD-10-CM | POA: Diagnosis not present

## 2021-04-07 DIAGNOSIS — R42 Dizziness and giddiness: Secondary | ICD-10-CM | POA: Diagnosis not present

## 2021-04-30 NOTE — Progress Notes (Signed)
Cardiology Office Note   Date:  05/03/2021   ID:  Kristina [name], DOB [birth year], MRN [id number]  PCP:  Pcp, No  Cardiologist:  Charlton Haws, MD EP: None  No chief complaint on file.     History of Present Illness: Kristina [name] is a 75 y.o. female with a PMH of labile HTN, HLD, DM type 2 on insulin, and hypothyroidism, who presents for follow-up  In 2019 had SSCP in setting of HTN urgency with cardiac CT 07/04/18 showing non-obstructive disease.Seen in ED June 20, 21 with edema and dyspnea BNP 283 CXR NAD, LE duplex no DVT Rx lasix  She has large varicosities in LE;s Norvasc D/c due to edema Seen by Dr Clifton James 05/30/20 She fell being off balance BP initially low given fluid. ECG ok SR poor R wave progression old no changes made  She has finished PT for cervical /lumbar pain   No cardiac issues.  Living alone with cats Has family in [place] area     Past Medical History:  Diagnosis Date   Anxiety    Asthma    Diabetes (HCC)    Diabetes mellitus without complication (HCC)    Diverticulitis    GERD (gastroesophageal reflux disease)    HTN (hypertension)    Hypercholesteremia    Hyperlipidemia    Hypertension    Obesity    Rosacea    Thyroid disease    Ulcerative colitis (HCC)     Past Surgical History:  Procedure Laterality Date   ACHILLES TENDON REPAIR     APPENDECTOMY     BIOPSY  10/16/2018   Procedure: BIOPSY;  Surgeon: Kathi Der, MD;  Location: [facility];  Service: Gastroenterology;;   CESAREAN SECTION     CHOLECYSTECTOMY     FLEXIBLE SIGMOIDOSCOPY N/A 10/16/2018   Procedure: FLEXIBLE SIGMOIDOSCOPY;  Surgeon: Kathi Der, MD;  Location: [facility];  Service: Gastroenterology;  Laterality: N/A;   hammertoe       Current Outpatient Medications  Medication Sig Dispense Refill   acetaminophen (TYLENOL) 500 MG tablet Take 1,000 mg by mouth 2 (two) times daily as needed (pain).     budesonide (ENTOCORT EC) 3 MG 24 hr capsule Take 3 mg by  mouth daily.     BYSTOLIC 10 MG tablet TAKE 1 TABLET (10 MG TOTAL) BY MOUTH DAILY. 30 tablet 5   CALCIUM PO Take 500 mg by mouth at bedtime.     furosemide (LASIX) 20 MG tablet TAKE 1 TABLET BY MOUTH EVERY DAY 90 tablet 2   hyoscyamine (LEVSIN) 0.125 MG tablet Take 0.125 mg by mouth 2 (two) times daily as needed for cramping.     insulin glargine (LANTUS) 100 unit/mL SOPN Inject 100 Units into the skin at bedtime. 18 Units in the morning and 5 units in the pm     Levothyroxine Sodium 50 MCG CAPS Take 50 mcg by mouth daily before breakfast.      losartan (COZAAR) 100 MG tablet TAKE 1 TABLET BY MOUTH EVERY DAY 90 tablet 2   mesalamine (APRISO) 0.375 g 24 hr capsule Take 4 capsules by mouth daily.     metFORMIN (GLUCOPHAGE-XR) 500 MG 24 hr tablet Take 3 tablets (1,500 mg total) by mouth daily.  0   Probiotic Product (ALIGN PO) Take 1 tablet by mouth daily.     simethicone (MYLICON) 125 MG chewable tablet Chew 125 mg by mouth every 6 (six) hours as needed for flatulence.     simvastatin (  ZOCOR) 20 MG tablet Take 20 mg by mouth every evening.      tizanidine (ZANAFLEX) 2 MG capsule Take 1-2 capsules (2-4 mg total) by mouth 3 (three) times daily. 20 capsule 0   VITAMIN D PO Take 2,000 Units by mouth daily.      No current facility-administered medications for this visit.    Allergies:   Ace inhibitors, Ciprofloxacin, Doxycycline, and Shrimp [shellfish allergy]    Social History:  The patient  reports that she has never smoked. She has never used smokeless tobacco. She reports that she does not drink alcohol and does not use drugs.   Family History:  The patient's family history includes Dementia in her father; Heart attack in her mother; Heart failure in her mother; Hypertension in her mother; Hypotension in her father.    ROS:  Please see the history of present illness.   Otherwise, review of systems are positive for none.   All other systems are reviewed and negative.    PHYSICAL EXAM: VS:   BP (!) 160/92   Pulse 62   Ht 5\' 4"  (1.626 m)   Wt 69.9 kg   SpO2 98%   BMI 26.47 kg/m  , BMI Body mass index is 26.47 kg/m.  Affect appropriate Elderly female  HEENT: normal Neck supple with no adenopathy JVP normal no bruits no thyromegaly Lungs clear with no wheezing and good diaphragmatic motion Heart:  S1/S2 no murmur, no rub, gallop or click PMI normal Abdomen: benighn, BS positve, no tenderness, no AAA no bruit.  No HSM or HJR Distal pulses intact with no bruits LE varicosities with edema Neuro non-focal Skin warm and dry No muscular weakness   EKG:   SR rate 70 poor R wave progression 05/30/20 05/03/2021 SR rate 62 normal    Recent Labs: No results found for requested labs within last 8760 hours.    Lipid Panel    Component Value Date/Time   CHOL 126 04/03/2018 1842   TRIG 159 (H) 04/03/2018 1842   HDL 41 04/03/2018 1842   CHOLHDL 3.1 04/03/2018 1842   VLDL 32 04/03/2018 1842   LDLCALC 53 04/03/2018 1842      Wt Readings from Last 3 Encounters:  05/03/21 69.9 kg  07/11/20 74.4 kg  05/30/20 75 kg      Other studies Reviewed: Additional studies/ records that were reviewed today include:   NST 02/2017: Nuclear stress EF: 51%. There was no ST segment deviation noted during stress. No T wave inversion was noted during stress. The study is normal. This is a low risk study.   Low risk stress nuclear study with normal perfusion and low normal left ventricular global systolic function.  Echocardiogram 02/25/20    1. Mid and basal inferior wall hypokinesis Normal GLS -15. Left  ventricular ejection fraction, by estimation, is 55%. The left ventricle  has normal function. The left ventricle has no regional wall motion  abnormalities. Left ventricular diastolic  parameters are consistent with Grade I diastolic dysfunction (impaired  relaxation).   2. Right ventricular systolic function is normal. The right ventricular  size is normal. There is normal  pulmonary artery systolic pressure.   3. Left atrial size was moderately dilated.   4. The mitral valve is normal in structure. Mild mitral valve  regurgitation. No evidence of mitral stenosis.   5. The aortic valve is tricuspid. Aortic valve regurgitation is not  visualized. Mild aortic valve sclerosis is present, with no evidence of  aortic valve  stenosis.   6. The inferior vena cava is normal in size with greater than 50%  respiratory variability, suggesting right atrial pressure of 3 mmHg.      ASSESSMENT AND PLAN:  1. Chest pain: Atypical  Cardiac CT 07/04/18 calcium score 122 68 th percentile non obstructive disease in RCA/LAD < 50% proximal LAD and ostial / mid RCA  Observe   2. Labile HTN:   Currently on cozaar , lasix and bystolic f/u Dr Hyman Hopes home readings ok   3. Varicose Veins:  Discussed primary making referral to [facility] , compression stockings, elevation and low dose non lasix diuretic Norvasc has been d/c   4. HLD: LDL 53  continue statin labs with primary   5. DM type 2: Last A1C 5.7 at goal of <7 - Continue current regimen and close follow-up with PCP  6. Diastolic CHF:   BNP 1261 02/25/20 echo 02/25/20 EF 55% GLS normal -15 only grade one diastolic and mild MR  She is euovolemic knows to take additional lasix as needed      No orders of the defined types were placed in this encounter.    Disposition:   FU with me in 6 months   Signed, Charlton Haws, MD  05/03/2021 8:26 AM

## 2021-05-03 ENCOUNTER — Other Ambulatory Visit: Payer: Self-pay

## 2021-05-03 ENCOUNTER — Encounter: Payer: Self-pay | Admitting: Cardiovascular Disease

## 2021-05-03 ENCOUNTER — Ambulatory Visit: Payer: Medicare PPO | Admitting: Cardiovascular Disease

## 2021-05-03 VITALS — BP 160/92 | HR 62 | Ht 64.0 in | Wt 154.2 lb

## 2021-05-03 DIAGNOSIS — I5043 Acute on chronic combined systolic (congestive) and diastolic (congestive) heart failure: Secondary | ICD-10-CM | POA: Diagnosis not present

## 2021-05-03 DIAGNOSIS — I10 Essential (primary) hypertension: Secondary | ICD-10-CM | POA: Diagnosis not present

## 2021-05-03 DIAGNOSIS — R072 Precordial pain: Secondary | ICD-10-CM | POA: Diagnosis not present

## 2021-05-03 NOTE — Patient Instructions (Signed)

## 2021-06-09 DIAGNOSIS — K529 Noninfective gastroenteritis and colitis, unspecified: Secondary | ICD-10-CM | POA: Diagnosis not present

## 2021-06-09 DIAGNOSIS — K589 Irritable bowel syndrome without diarrhea: Secondary | ICD-10-CM | POA: Diagnosis not present

## 2021-06-10 ENCOUNTER — Other Ambulatory Visit: Payer: Self-pay | Admitting: Cardiovascular Disease

## 2021-07-17 ENCOUNTER — Other Ambulatory Visit: Payer: Self-pay | Admitting: Cardiovascular Disease

## 2021-09-11 DIAGNOSIS — K589 Irritable bowel syndrome without diarrhea: Secondary | ICD-10-CM | POA: Diagnosis not present

## 2021-09-15 DIAGNOSIS — H40033 Anatomical narrow angle, bilateral: Secondary | ICD-10-CM | POA: Diagnosis not present

## 2021-09-15 DIAGNOSIS — E119 Type 2 diabetes mellitus without complications: Secondary | ICD-10-CM | POA: Diagnosis not present

## 2021-09-20 DIAGNOSIS — E039 Hypothyroidism, unspecified: Secondary | ICD-10-CM | POA: Diagnosis not present

## 2021-09-20 DIAGNOSIS — R413 Other amnesia: Secondary | ICD-10-CM | POA: Diagnosis not present

## 2021-09-20 DIAGNOSIS — E1169 Type 2 diabetes mellitus with other specified complication: Secondary | ICD-10-CM | POA: Diagnosis not present

## 2021-09-20 DIAGNOSIS — E1165 Type 2 diabetes mellitus with hyperglycemia: Secondary | ICD-10-CM | POA: Diagnosis not present

## 2021-09-20 DIAGNOSIS — I10 Essential (primary) hypertension: Secondary | ICD-10-CM | POA: Diagnosis not present

## 2021-09-26 ENCOUNTER — Other Ambulatory Visit: Payer: Self-pay | Admitting: Family Medicine

## 2021-09-26 DIAGNOSIS — R413 Other amnesia: Secondary | ICD-10-CM

## 2021-10-07 ENCOUNTER — Ambulatory Visit
Admission: RE | Admit: 2021-10-07 | Discharge: 2021-10-07 | Disposition: A | Discharge status: Home / Self Care | Payer: Medicare PPO | Source: Ambulatory Visit | Attending: Family Medicine | Admitting: Family Medicine

## 2021-10-07 ENCOUNTER — Other Ambulatory Visit: Payer: Self-pay

## 2021-10-07 DIAGNOSIS — R413 Other amnesia: Secondary | ICD-10-CM

## 2021-10-07 DIAGNOSIS — F410 Panic disorder [episodic paroxysmal anxiety] without agoraphobia: Secondary | ICD-10-CM | POA: Diagnosis not present

## 2021-10-13 DIAGNOSIS — I10 Essential (primary) hypertension: Secondary | ICD-10-CM | POA: Diagnosis not present

## 2021-10-13 DIAGNOSIS — Z79899 Other long term (current) drug therapy: Secondary | ICD-10-CM | POA: Diagnosis not present

## 2021-10-13 DIAGNOSIS — E1165 Type 2 diabetes mellitus with hyperglycemia: Secondary | ICD-10-CM | POA: Diagnosis not present

## 2021-10-13 DIAGNOSIS — R413 Other amnesia: Secondary | ICD-10-CM | POA: Diagnosis not present

## 2021-10-30 ENCOUNTER — Telehealth: Payer: Self-pay | Admitting: Cardiovascular Disease

## 2021-10-30 NOTE — Telephone Encounter (Signed)
Pt c/o BP issue: STAT if pt c/o blurred vision, one-sided weakness or slurred speech ? ?1. What are your last 5 BP readings?  ?10/23/21 AM 124/71 HR 60 ?03/28 AM 173/90 HR 56 ?03/29 AM 160/78 midday between 12-1 139/66 HR 71  ?03/30 AM 135/79 HR 63 ?03/31 AM 147/64 HR 63 ?04/01 midday between 12-1 151/76 HR 67 ?04/02 AM 154/81 HR 60 ? ?2. Are you having any other symptoms (ex. Dizziness, headache, blurred vision, passed out)? When it has been low she felt strange like she was tired ? ?3. What is your BP issue?  Patient is calling to report recent BP fluctuation. Her PCP office [name] has made some medication changes. She was taken off losartan and started valsartan in the beginning of March. She began with 320 MG of valsartan, but is now on 160 MG, one tablet daily.  Patient also reports she is taking two tablets of nebivolol 10 MG which she has been doing for a while.  ?

## 2021-10-30 NOTE — Telephone Encounter (Signed)
Per Dr. Eden Emms, MRI was fine and agree with spacing out and staying on same BP meds. Patient agreed to plan and will find a PCP that she is more comfortable with.  ?

## 2021-10-30 NOTE — Telephone Encounter (Signed)
Patient would like Dr. Fabio Bering opinion on two things. ? ?1- ?Patient is concerned about her BP medication changes from her temporary PCP, Dr. Docia Chuck. Patient stated Dr. Hyman Hopes is no longer practicing at the office.  Patient stated her losartan 100 mg was changed to Valsartan 320 mg (later reduced to 160 mg), and Bystolic was increased to 20 mg. Currently patient is taking Lasix 20 mg, Valsartan 160 mg, and Bystolic 20 mg in the morning for her BP. SBP is 140's to 170's most of the time, but has some good readings as well. Encouraged patient to take medications at different times instead of all at once to see if this helps. Patient will try taking Bystolic in the early morning, Lasix in the late morning, and Valsartan late afternoon. ? ?2- ?Patient stated she had an MRI of the head, that she wants Dr. Eden Emms to look at to see if there is anything wrong with it. Patient stated that she has a family history of dementia and she had 1 1/2 weeks of feeling off and reacting differently to things going on that lead her to getting an MRI. ? ?Will forward to Dr. Eden Emms for advisement. ?

## 2021-11-14 DIAGNOSIS — E1169 Type 2 diabetes mellitus with other specified complication: Secondary | ICD-10-CM | POA: Diagnosis not present

## 2021-11-14 DIAGNOSIS — I10 Essential (primary) hypertension: Secondary | ICD-10-CM | POA: Diagnosis not present

## 2021-11-14 DIAGNOSIS — E039 Hypothyroidism, unspecified: Secondary | ICD-10-CM | POA: Diagnosis not present

## 2021-11-14 DIAGNOSIS — E538 Deficiency of other specified B group vitamins: Secondary | ICD-10-CM | POA: Diagnosis not present

## 2021-11-14 DIAGNOSIS — L309 Dermatitis, unspecified: Secondary | ICD-10-CM | POA: Diagnosis not present

## 2021-11-14 DIAGNOSIS — R413 Other amnesia: Secondary | ICD-10-CM | POA: Diagnosis not present

## 2021-11-17 DIAGNOSIS — K529 Noninfective gastroenteritis and colitis, unspecified: Secondary | ICD-10-CM | POA: Diagnosis not present

## 2021-11-17 DIAGNOSIS — K589 Irritable bowel syndrome without diarrhea: Secondary | ICD-10-CM | POA: Diagnosis not present

## 2021-12-08 DIAGNOSIS — I10 Essential (primary) hypertension: Secondary | ICD-10-CM | POA: Diagnosis not present

## 2021-12-08 DIAGNOSIS — E039 Hypothyroidism, unspecified: Secondary | ICD-10-CM | POA: Diagnosis not present

## 2021-12-08 DIAGNOSIS — E785 Hyperlipidemia, unspecified: Secondary | ICD-10-CM | POA: Diagnosis not present

## 2021-12-08 DIAGNOSIS — E1169 Type 2 diabetes mellitus with other specified complication: Secondary | ICD-10-CM | POA: Diagnosis not present

## 2021-12-08 DIAGNOSIS — E538 Deficiency of other specified B group vitamins: Secondary | ICD-10-CM | POA: Diagnosis not present

## 2021-12-08 DIAGNOSIS — I700 Atherosclerosis of aorta: Secondary | ICD-10-CM | POA: Diagnosis not present

## 2021-12-22 DIAGNOSIS — R0789 Other chest pain: Secondary | ICD-10-CM | POA: Diagnosis not present

## 2021-12-22 DIAGNOSIS — I10 Essential (primary) hypertension: Secondary | ICD-10-CM | POA: Diagnosis not present

## 2021-12-26 ENCOUNTER — Telehealth: Payer: Self-pay | Admitting: Cardiovascular Disease

## 2021-12-26 DIAGNOSIS — R072 Precordial pain: Secondary | ICD-10-CM

## 2021-12-26 NOTE — Telephone Encounter (Signed)
Pt's PCP calling to state that pt was having chest discomfort when seen in their office on 12/22/21. PCP would like to know if Dr. Eden Emms thinks pt should have a Stress Test or Cardiac Imaging done. Please advice

## 2021-12-27 NOTE — Telephone Encounter (Signed)
Per Dr. Eden Emms office note on 05/03/21.   "Chest pain: Atypical  Cardiac CT 07/04/18 calcium score 122 68 th percentile non obstructive disease in RCA/LAD < 50% proximal LAD and ostial / mid RCA  Observe"  Will forward to Dr. Eden Emms for advisement.

## 2021-12-27 NOTE — Telephone Encounter (Addendum)
Called patient to let her know per Dr. Eden Emms, we can order PET/CT. Will place order. Called patient's PCP and left message of what we plan to do and a number to call back if they had any questions.   How to Prepare for Your Cardiac PET/CT Stress Test:  1. Please do not take these medications before your test:   Nebivolol Metformin  Your remaining medications may be taken with water.  2. Nothing to eat or drink, except water, 3 hours prior to arrival time.   NO caffeine/decaffeinated products, or chocolate 12 hours prior to arrival.  3. NO perfume, cologne or lotion  4. Total time is 1 to 2 hours; you may want to bring reading material for the waiting time.  5. Please report to Admitting at the [facility] Main Entrance 60 minutes early for your test.  [address] [place], [place] [address]  Diabetic Preparation:  Check blood sugars prior to leaving the house. If able to eat breakfast prior to 3 hour fasting, you may take all medications.  IF YOU THINK YOU MAY BE PREGNANT, OR ARE NURSING PLEASE INFORM THE TECHNOLOGIST.  In preparation for your appointment, medication and supplies will be purchased.  Appointment availability is limited, so if you need to cancel or reschedule, please call the Radiology Department at [phone]  24 hours in advance to avoid a cancellation fee of $100.00  What to Expect After you Arrive:  Once you arrive and check in for your appointment, you will be taken to a preparation room within the Radiology Department.  A technologist or Nurse will obtain your medical history, verify that you are correctly prepped for the exam, and explain the procedure.  Afterwards,  an IV will be started in your arm and electrodes will be placed on your skin for EKG monitoring during the stress portion of the exam. Then you will be escorted to the PET/CT scanner.  There, staff will get you positioned on the scanner and obtain a blood pressure and EKG.  During  the exam, you will continue to be connected to the EKG and blood pressure machines.  A small, safe amount of a radioactive tracer will be injected in your IV to obtain a series of pictures of your heart along with an injection of a stress agent.    After your Exam:  It is recommended that you eat a meal and drink a caffeinated beverage to counter act any effects of the stress agent.  Drink plenty of fluids for the remainder of the day and urinate frequently for the first couple of hours after the exam.  Your doctor will inform you of your test results within 7-10 business days.  For questions about your test or how to prepare for your test, please call: Rockwell Alexandria, Cardiac Imaging Nurse Navigator  Larey Brick, Cardiac Imaging Nurse Navigator Office: [phone]

## 2022-01-01 ENCOUNTER — Telehealth: Payer: Self-pay | Admitting: Cardiovascular Disease

## 2022-01-01 NOTE — Telephone Encounter (Signed)
Kristina [name] returning call, call transferred to [facility]

## 2022-01-01 NOTE — Telephone Encounter (Signed)
Left message for Talbert Forest, Dr. Christena Flake nurse, to call back.   Per Dr. Fabio Bering last OV note 05/03/21-  "1. Chest pain: Atypical  Cardiac CT 07/04/18 calcium score 122 68 th percentile non obstructive disease in RCA/LAD < 50% proximal LAD and ostial / mid RCA  Observe"  Will send message to Dr. Eden Emms for advisement on testing, due to patient having chest discomfort at Dr. Christena Flake office.

## 2022-01-01 NOTE — Telephone Encounter (Signed)
Dr. Lindell Noe is wanting to know if the patient need to have cardiac imaging or test done. She came into office with chest discomfort. Please advise

## 2022-01-01 NOTE — Telephone Encounter (Signed)
Call transferred to triage. Talked to [facility] CMA with Dr. [name]. She stated patient saw them on 12/22/21 and was having right chest discomfort and wanted advisement on testing. Message has been sent to Dr. Eden Emms.

## 2022-01-03 ENCOUNTER — Other Ambulatory Visit: Payer: Self-pay | Admitting: Cardiovascular Disease

## 2022-01-03 NOTE — Telephone Encounter (Signed)
Per Dr. Eden Emms, Pain sounds atypical had non obstructive dx by CT in 2021 see DOD/P"A and consider f/u PET/CT. Patient has PET/CT already ordered. Waiting for patient to be scheduled.

## 2022-01-11 ENCOUNTER — Telehealth: Payer: Self-pay | Admitting: Cardiovascular Disease

## 2022-01-11 NOTE — Telephone Encounter (Signed)
Calling to see if the dr want patient to have stress test or ct? She been having chest discomfort. Please advise

## 2022-01-11 NOTE — Telephone Encounter (Signed)
Kristina [name] pt, will forward

## 2022-01-11 NOTE — Telephone Encounter (Signed)
Left message informing Kristina [name] with [name] that Dr. Eden Emms has ordered a Cardiac PET CT and we are waiting for it to get scheduled.

## 2022-01-17 DIAGNOSIS — I10 Essential (primary) hypertension: Secondary | ICD-10-CM | POA: Diagnosis not present

## 2022-01-29 DIAGNOSIS — I10 Essential (primary) hypertension: Secondary | ICD-10-CM | POA: Diagnosis not present

## 2022-02-14 NOTE — Telephone Encounter (Signed)
Patient schedule for Cardiac PET CT on 03/27/22.

## 2022-02-19 DIAGNOSIS — R0602 Shortness of breath: Secondary | ICD-10-CM | POA: Diagnosis not present

## 2022-02-19 DIAGNOSIS — I10 Essential (primary) hypertension: Secondary | ICD-10-CM | POA: Diagnosis not present

## 2022-02-22 ENCOUNTER — Other Ambulatory Visit: Payer: Self-pay

## 2022-02-22 ENCOUNTER — Encounter (HOSPITAL_COMMUNITY): Payer: Self-pay

## 2022-02-22 ENCOUNTER — Inpatient Hospital Stay (HOSPITAL_COMMUNITY)
Admission: EM | Admit: 2022-02-22 | Discharge: 2022-02-28 | DRG: 078 | Disposition: A | Discharge status: Home Health | Payer: Medicare PPO | Attending: Family Medicine | Admitting: Family Medicine

## 2022-02-22 ENCOUNTER — Observation Stay (HOSPITAL_COMMUNITY): Payer: Medicare PPO

## 2022-02-22 ENCOUNTER — Emergency Department (HOSPITAL_COMMUNITY): Payer: Medicare PPO

## 2022-02-22 DIAGNOSIS — R519 Headache, unspecified: Secondary | ICD-10-CM

## 2022-02-22 DIAGNOSIS — R7989 Other specified abnormal findings of blood chemistry: Secondary | ICD-10-CM | POA: Diagnosis present

## 2022-02-22 DIAGNOSIS — E039 Hypothyroidism, unspecified: Secondary | ICD-10-CM | POA: Diagnosis present

## 2022-02-22 DIAGNOSIS — Z79890 Hormone replacement therapy (postmenopausal): Secondary | ICD-10-CM

## 2022-02-22 DIAGNOSIS — I674 Hypertensive encephalopathy: Secondary | ICD-10-CM | POA: Diagnosis present

## 2022-02-22 DIAGNOSIS — Z888 Allergy status to other drugs, medicaments and biological substances status: Secondary | ICD-10-CM | POA: Diagnosis not present

## 2022-02-22 DIAGNOSIS — I10 Essential (primary) hypertension: Secondary | ICD-10-CM | POA: Diagnosis not present

## 2022-02-22 DIAGNOSIS — I2510 Atherosclerotic heart disease of native coronary artery without angina pectoris: Secondary | ICD-10-CM | POA: Diagnosis present

## 2022-02-22 DIAGNOSIS — E7800 Pure hypercholesterolemia, unspecified: Secondary | ICD-10-CM | POA: Diagnosis present

## 2022-02-22 DIAGNOSIS — J45909 Unspecified asthma, uncomplicated: Secondary | ICD-10-CM | POA: Diagnosis present

## 2022-02-22 DIAGNOSIS — J3489 Other specified disorders of nose and nasal sinuses: Secondary | ICD-10-CM | POA: Diagnosis not present

## 2022-02-22 DIAGNOSIS — I110 Hypertensive heart disease with heart failure: Secondary | ICD-10-CM | POA: Diagnosis present

## 2022-02-22 DIAGNOSIS — Z8249 Family history of ischemic heart disease and other diseases of the circulatory system: Secondary | ICD-10-CM | POA: Diagnosis not present

## 2022-02-22 DIAGNOSIS — E079 Disorder of thyroid, unspecified: Secondary | ICD-10-CM

## 2022-02-22 DIAGNOSIS — K219 Gastro-esophageal reflux disease without esophagitis: Secondary | ICD-10-CM | POA: Diagnosis present

## 2022-02-22 DIAGNOSIS — E119 Type 2 diabetes mellitus without complications: Secondary | ICD-10-CM | POA: Diagnosis present

## 2022-02-22 DIAGNOSIS — I5032 Chronic diastolic (congestive) heart failure: Secondary | ICD-10-CM | POA: Diagnosis present

## 2022-02-22 DIAGNOSIS — G9340 Encephalopathy, unspecified: Secondary | ICD-10-CM | POA: Diagnosis not present

## 2022-02-22 DIAGNOSIS — K2289 Other specified disease of esophagus: Secondary | ICD-10-CM | POA: Diagnosis present

## 2022-02-22 DIAGNOSIS — Z7984 Long term (current) use of oral hypoglycemic drugs: Secondary | ICD-10-CM | POA: Diagnosis not present

## 2022-02-22 DIAGNOSIS — R079 Chest pain, unspecified: Secondary | ICD-10-CM

## 2022-02-22 DIAGNOSIS — R8281 Pyuria: Secondary | ICD-10-CM | POA: Diagnosis present

## 2022-02-22 DIAGNOSIS — R4182 Altered mental status, unspecified: Secondary | ICD-10-CM | POA: Diagnosis not present

## 2022-02-22 DIAGNOSIS — R0602 Shortness of breath: Secondary | ICD-10-CM | POA: Diagnosis present

## 2022-02-22 DIAGNOSIS — K224 Dyskinesia of esophagus: Secondary | ICD-10-CM | POA: Diagnosis present

## 2022-02-22 DIAGNOSIS — Z91013 Allergy to seafood: Secondary | ICD-10-CM | POA: Diagnosis not present

## 2022-02-22 DIAGNOSIS — F419 Anxiety disorder, unspecified: Secondary | ICD-10-CM | POA: Diagnosis present

## 2022-02-22 DIAGNOSIS — R0600 Dyspnea, unspecified: Secondary | ICD-10-CM | POA: Diagnosis not present

## 2022-02-22 DIAGNOSIS — I959 Hypotension, unspecified: Secondary | ICD-10-CM | POA: Diagnosis not present

## 2022-02-22 DIAGNOSIS — I161 Hypertensive emergency: Secondary | ICD-10-CM | POA: Diagnosis present

## 2022-02-22 DIAGNOSIS — R1310 Dysphagia, unspecified: Secondary | ICD-10-CM | POA: Diagnosis not present

## 2022-02-22 DIAGNOSIS — Z79899 Other long term (current) drug therapy: Secondary | ICD-10-CM | POA: Diagnosis not present

## 2022-02-22 DIAGNOSIS — R112 Nausea with vomiting, unspecified: Secondary | ICD-10-CM | POA: Diagnosis not present

## 2022-02-22 DIAGNOSIS — K222 Esophageal obstruction: Secondary | ICD-10-CM | POA: Diagnosis not present

## 2022-02-22 DIAGNOSIS — Z881 Allergy status to other antibiotic agents status: Secondary | ICD-10-CM | POA: Diagnosis not present

## 2022-02-22 DIAGNOSIS — R0789 Other chest pain: Secondary | ICD-10-CM | POA: Diagnosis not present

## 2022-02-22 DIAGNOSIS — E871 Hypo-osmolality and hyponatremia: Secondary | ICD-10-CM | POA: Diagnosis not present

## 2022-02-22 LAB — COMPREHENSIVE METABOLIC PANEL
ALT: 12 U/L (ref 0–44)
AST: 19 U/L (ref 15–41)
Albumin: 3.9 g/dL (ref 3.5–5.0)
Alkaline Phosphatase: 52 U/L (ref 38–126)
Anion gap: 11 (ref 5–15)
BUN: 7 mg/dL — ABNORMAL LOW (ref 8–23)
CO2: 22 mmol/L (ref 22–32)
Calcium: 9.4 mg/dL (ref 8.9–10.3)
Chloride: 99 mmol/L (ref 98–111)
Creatinine, Ser: 0.57 mg/dL (ref 0.44–1.00)
GFR, Estimated: >60 mL/min (ref >60)
Glucose, Bld: 131 mg/dL — ABNORMAL HIGH (ref 70–99)
Potassium: 4.1 mmol/L (ref 3.5–5.1)
Sodium: 132 mmol/L — ABNORMAL LOW (ref 135–145)
Total Bilirubin: 0.9 mg/dL (ref 0.3–1.2)
Total Protein: 6.8 g/dL (ref 6.5–8.1)

## 2022-02-22 LAB — CBC WITH DIFFERENTIAL/PLATELET
Abs Immature Granulocytes: 0.01 10*3/uL (ref 0.00–0.07)
Basophils Absolute: 0 10*3/uL (ref 0.0–0.1)
Basophils Relative: 1 %
Eosinophils Absolute: 0.1 10*3/uL (ref 0.0–0.5)
Eosinophils Relative: 2 %
HCT: 37.1 % (ref 36.0–46.0)
Hemoglobin: 13.3 g/dL (ref 12.0–15.0)
Immature Granulocytes: 0 %
Lymphocytes Relative: 12 %
Lymphs Abs: 0.6 10*3/uL — ABNORMAL LOW (ref 0.7–4.0)
MCH: 33.3 pg (ref 26.0–34.0)
MCHC: 35.8 g/dL (ref 30.0–36.0)
MCV: 92.8 fL (ref 80.0–100.0)
Monocytes Absolute: 0.3 10*3/uL (ref 0.1–1.0)
Monocytes Relative: 6 %
Neutro Abs: 4 10*3/uL (ref 1.7–7.7)
Neutrophils Relative %: 79 %
Platelets: 200 10*3/uL (ref 150–400)
RBC: 4 MIL/uL (ref 3.87–5.11)
RDW: 12.5 % (ref 11.5–15.5)
WBC: 5 10*3/uL (ref 4.0–10.5)
nRBC: 0 % (ref 0.0–0.2)

## 2022-02-22 LAB — MRSA NEXT GEN BY PCR, NASAL: MRSA by PCR Next Gen: NOT DETECTED

## 2022-02-22 LAB — CBG MONITORING, ED: Glucose-Capillary: 215 mg/dL — ABNORMAL HIGH (ref 70–99)

## 2022-02-22 LAB — BRAIN NATRIURETIC PEPTIDE: B Natriuretic Peptide: 225.3 pg/mL — ABNORMAL HIGH (ref 0.0–100.0)

## 2022-02-22 LAB — TROPONIN I (HIGH SENSITIVITY)
Troponin I (High Sensitivity): 5 ng/L (ref <18)
Troponin I (High Sensitivity): 5 ng/L (ref <18)

## 2022-02-22 LAB — GLUCOSE, CAPILLARY: Glucose-Capillary: 211 mg/dL — ABNORMAL HIGH (ref 70–99)

## 2022-02-22 LAB — D-DIMER, QUANTITATIVE: D-Dimer, Quant: 0.52 ug/mL-FEU — ABNORMAL HIGH (ref 0.00–0.50)

## 2022-02-22 MED ORDER — INSULIN ASPART 100 UNIT/ML IJ SOLN
0.0000 [IU] | Freq: Three times a day (TID) | INTRAMUSCULAR | Status: DC
  Administered 2022-02-23: 3 [IU] via SUBCUTANEOUS
  Administered 2022-02-23: 2 [IU] via SUBCUTANEOUS
  Administered 2022-02-23: 1 [IU] via SUBCUTANEOUS
  Administered 2022-02-24: 2 [IU] via SUBCUTANEOUS
  Administered 2022-02-24: 3 [IU] via SUBCUTANEOUS
  Administered 2022-02-24: 7 [IU] via SUBCUTANEOUS
  Administered 2022-02-25 (×2): 5 [IU] via SUBCUTANEOUS
  Administered 2022-02-25 – 2022-02-26 (×2): 1 [IU] via SUBCUTANEOUS
  Administered 2022-02-26: 2 [IU] via SUBCUTANEOUS
  Administered 2022-02-26 – 2022-02-27 (×3): 5 [IU] via SUBCUTANEOUS
  Administered 2022-02-27: 2 [IU] via SUBCUTANEOUS
  Administered 2022-02-28: 3 [IU] via SUBCUTANEOUS
  Filled 2022-02-22: qty 0.09

## 2022-02-22 MED ORDER — ONDANSETRON HCL 4 MG/2ML IJ SOLN
4.0000 mg | Freq: Four times a day (QID) | INTRAMUSCULAR | Status: DC | PRN
  Administered 2022-02-22: 4 mg via INTRAVENOUS
  Filled 2022-02-22 (×2): qty 2

## 2022-02-22 MED ORDER — SODIUM CHLORIDE 0.9 % IV BOLUS
500.0000 mL | Freq: Once | INTRAVENOUS | Status: AC
Start: 2022-02-22 — End: 2022-02-22
  Administered 2022-02-22: 500 mL via INTRAVENOUS

## 2022-02-22 MED ORDER — CHLORHEXIDINE GLUCONATE CLOTH 2 % EX PADS
6.0000 | MEDICATED_PAD | Freq: Every day | CUTANEOUS | Status: DC
Start: 2022-02-22 — End: 2022-02-28
  Administered 2022-02-22 – 2022-02-27 (×6): 6 via TOPICAL

## 2022-02-22 MED ORDER — ACETAMINOPHEN 325 MG PO TABS
325.0000 mg | ORAL_TABLET | Freq: Once | ORAL | Status: AC
  Administered 2022-02-22: 325 mg via ORAL
  Filled 2022-02-22: qty 1

## 2022-02-22 MED ORDER — BUDESONIDE 3 MG PO CPEP
3.0000 mg | ORAL_CAPSULE | Freq: Every day | ORAL | Status: DC
  Administered 2022-02-22 – 2022-02-28 (×7): 3 mg via ORAL
  Filled 2022-02-22 (×7): qty 1

## 2022-02-22 MED ORDER — SODIUM CHLORIDE 0.9 % IV SOLN
250.0000 mL | INTRAVENOUS | Status: DC | PRN
  Administered 2022-02-25 – 2022-02-26 (×2): 250 mL via INTRAVENOUS

## 2022-02-22 MED ORDER — IBUPROFEN 200 MG PO TABS
400.0000 mg | ORAL_TABLET | Freq: Once | ORAL | Status: DC
  Filled 2022-02-22: qty 2

## 2022-02-22 MED ORDER — HYDROCHLOROTHIAZIDE 12.5 MG PO TABS: 12.5000 mg | ORAL_TABLET | Freq: Every day | ORAL | Status: DC

## 2022-02-22 MED ORDER — TRAMADOL HCL 50 MG PO TABS
50.0000 mg | ORAL_TABLET | Freq: Three times a day (TID) | ORAL | Status: DC | PRN
  Administered 2022-02-23: 50 mg via ORAL
  Filled 2022-02-22 (×2): qty 1

## 2022-02-22 MED ORDER — SODIUM CHLORIDE 0.9% FLUSH
3.0000 mL | INTRAVENOUS | Status: DC | PRN
  Administered 2022-02-23 – 2022-02-24 (×2): 3 mL via INTRAVENOUS

## 2022-02-22 MED ORDER — ONDANSETRON HCL 4 MG PO TABS: 4.0000 mg | ORAL_TABLET | Freq: Four times a day (QID) | ORAL | Status: DC | PRN

## 2022-02-22 MED ORDER — ONDANSETRON HCL 4 MG/2ML IJ SOLN
4.0000 mg | Freq: Four times a day (QID) | INTRAMUSCULAR | Status: DC | PRN
  Administered 2022-02-23 – 2022-02-26 (×6): 4 mg via INTRAVENOUS
  Filled 2022-02-22 (×5): qty 2

## 2022-02-22 MED ORDER — ENOXAPARIN SODIUM 40 MG/0.4ML IJ SOSY
40.0000 mg | PREFILLED_SYRINGE | INTRAMUSCULAR | Status: DC
Start: 2022-02-22 — End: 2022-02-28
  Administered 2022-02-22 – 2022-02-27 (×6): 40 mg via SUBCUTANEOUS
  Filled 2022-02-22 (×6): qty 0.4

## 2022-02-22 MED ORDER — LEVOTHYROXINE SODIUM 50 MCG PO TABS
50.0000 ug | ORAL_TABLET | Freq: Every morning | ORAL | Status: DC
  Administered 2022-02-23 – 2022-02-28 (×6): 50 ug via ORAL
  Filled 2022-02-22 (×6): qty 1

## 2022-02-22 MED ORDER — ACETAMINOPHEN 325 MG PO TABS
650.0000 mg | ORAL_TABLET | Freq: Once | ORAL | Status: AC
  Administered 2022-02-22: 650 mg via ORAL
  Filled 2022-02-22: qty 2

## 2022-02-22 MED ORDER — VITAMIN B-12 1000 MCG PO TABS
1000.0000 ug | ORAL_TABLET | Freq: Every day | ORAL | Status: DC
  Administered 2022-02-22 – 2022-02-28 (×7): 1000 ug via ORAL
  Filled 2022-02-22 (×7): qty 1

## 2022-02-22 MED ORDER — IRBESARTAN 150 MG PO TABS
150.0000 mg | ORAL_TABLET | Freq: Every day | ORAL | Status: DC
  Administered 2022-02-22 – 2022-02-28 (×6): 150 mg via ORAL
  Filled 2022-02-22 (×8): qty 1

## 2022-02-22 MED ORDER — ORAL CARE MOUTH RINSE: 15.0000 mL | OROMUCOSAL | Status: DC | PRN

## 2022-02-22 MED ORDER — ASPIRIN 81 MG PO TBEC
81.0000 mg | DELAYED_RELEASE_TABLET | Freq: Every day | ORAL | Status: DC
  Administered 2022-02-23 – 2022-02-28 (×6): 81 mg via ORAL
  Filled 2022-02-22 (×6): qty 1

## 2022-02-22 MED ORDER — BISACODYL 5 MG PO TBEC: 5.0000 mg | DELAYED_RELEASE_TABLET | Freq: Every day | ORAL | Status: DC | PRN

## 2022-02-22 MED ORDER — HYOSCYAMINE SULFATE 0.125 MG SL SUBL
0.1250 mg | SUBLINGUAL_TABLET | Freq: Two times a day (BID) | SUBLINGUAL | Status: DC | PRN
  Administered 2022-02-22: 0.125 mg via ORAL
  Filled 2022-02-22: qty 1

## 2022-02-22 MED ORDER — HYDROCHLOROTHIAZIDE 12.5 MG PO TABS
12.5000 mg | ORAL_TABLET | Freq: Once | ORAL | Status: AC
  Administered 2022-02-22: 12.5 mg via ORAL
  Filled 2022-02-22 (×2): qty 1

## 2022-02-22 MED ORDER — NEBIVOLOL HCL 10 MG PO TABS
20.0000 mg | ORAL_TABLET | Freq: Every day | ORAL | Status: DC
  Administered 2022-02-22 – 2022-02-24 (×3): 20 mg via ORAL
  Filled 2022-02-22 (×5): qty 2

## 2022-02-22 MED ORDER — HYDRALAZINE HCL 20 MG/ML IJ SOLN
10.0000 mg | Freq: Four times a day (QID) | INTRAMUSCULAR | Status: DC | PRN
Start: 2022-02-22 — End: 2022-02-28
  Administered 2022-02-22 – 2022-02-23 (×3): 10 mg via INTRAVENOUS
  Filled 2022-02-22 (×3): qty 1

## 2022-02-22 MED ORDER — SIMVASTATIN 20 MG PO TABS
20.0000 mg | ORAL_TABLET | Freq: Every evening | ORAL | Status: DC
  Administered 2022-02-22 – 2022-02-27 (×5): 20 mg via ORAL
  Filled 2022-02-22 (×2): qty 1
  Filled 2022-02-22 (×2): qty 2
  Filled 2022-02-22: qty 1

## 2022-02-22 MED ORDER — ENSURE ENLIVE PO LIQD
237.0000 mL | Freq: Two times a day (BID) | ORAL | Status: DC
  Administered 2022-02-23 – 2022-02-28 (×8): 237 mL via ORAL

## 2022-02-22 MED ORDER — HYOSCYAMINE SULFATE 0.125 MG PO TABS: 0.1250 mg | ORAL_TABLET | Freq: Two times a day (BID) | ORAL | Status: DC | PRN

## 2022-02-22 MED ORDER — NITROGLYCERIN 0.4 MG SL SUBL
0.4000 mg | SUBLINGUAL_TABLET | SUBLINGUAL | Status: DC | PRN
  Administered 2022-02-22 – 2022-02-23 (×5): 0.4 mg via SUBLINGUAL
  Filled 2022-02-22 (×5): qty 1

## 2022-02-22 MED ORDER — LABETALOL HCL 5 MG/ML IV SOLN
5.0000 mg | INTRAVENOUS | Status: DC | PRN
Start: 2022-02-22 — End: 2022-02-28
  Administered 2022-02-22: 5 mg via INTRAVENOUS
  Filled 2022-02-22 (×2): qty 4

## 2022-02-22 MED ORDER — SODIUM CHLORIDE 0.9% FLUSH
3.0000 mL | Freq: Two times a day (BID) | INTRAVENOUS | Status: DC
Start: 2022-02-22 — End: 2022-02-28
  Administered 2022-02-22 – 2022-02-27 (×10): 3 mL via INTRAVENOUS

## 2022-02-22 NOTE — ED Provider Notes (Addendum)
I was asked to see patient as she had more questions about her work-up.  Previous providers work-up reviewed.  Troponins negative x2.  No overt evidence of CHF.  Chest x-ray clear and EKG without ischemia.  CT head shows no head bleed.  She was worried about a possible aneurysm and describes a headache that started at 7 AM with a negative head CT 3 hours later.  My suspicion is fairly low this was aneurysmal and I do not think further work-up is emergently needed.  She does complain about shortness of breath for over a week though her O2 sats are 100% and besides some moderate hypertension her other vitals are unremarkable.  We discussed need for close PCP follow-up and after discussion with son and patient they are okay going home and calling doctor tomorrow.  We discussed return precautions.   Pricilla Loveless, MD 02/22/22 1625  4:52 PM Son asked to speak to me again.  He notes that she seems quite winded when trying to just sit up in bed.  He also feels like she is "foggy" and had a hard time dialing the phone number for her doctor to set up an appointment.  He is worried she is not herself and does not feel comfortable taking her home.  She is now a little tachycardic although that is new just since the last hour or so and I think is more anxiety related to going home.  However I will add on a D-dimer but this otherwise sounds unlikely to be PE.  From an altered mental status standpoint, she describes stuttering and sometimes not being able to remember what words she is using but then can later remember.  Does not sound strokelike.  Given familial concerns will discuss with the hospitalist for observation.  5:06 PM I discussed with Dr. [name]. She will see and admit. Asks for cards consult based on exertional dyspnea and significant HTN. Of note, the nurse tells me she walked her to the bathroom and she became diaphoretic and vomited.  Most recent heart rate elevation and significant hypertension  (192/138) is from when she sat back down.  Repeat EKG shows some significant tachycardia and nonspecific changes but no STEMI.  5:47 PM Age adjusted d-dimer negative. Discussed with cardiology, Theodore Demark. They will see in AM.   Pricilla Loveless, MD 02/22/22 1750

## 2022-02-22 NOTE — ED Notes (Signed)
Assisted pt in ambulating to the restroom. Upon entering the restroom pt noted to be severely shob, diaphoretic and began projectile vomiting. Pt taken back to room in wheelchair, 12 leak obtained, Dr. Criss Alvine informed.

## 2022-02-22 NOTE — ED Notes (Signed)
I assist pt with bedpan while helping her pt started to vomit over on over stated that she had cold chills and continues to have painful headache. Nurse aware.

## 2022-02-22 NOTE — ED Provider Notes (Signed)
[facility]-EMERGENCY DEPT Provider Note   CSN: [id number] Arrival date & time: 02/22/22  3557     History  Chief Complaint  Patient presents with   Shortness of Breath    Kristina [name] is a 76 y.o. female.  Patient here with shortness of breath.  Some chest tightness.  Today she is mostly having some shortness of breath.  But her symptoms have gotten better on her own.  She has been having some issues with her blood pressure here recently and just had her blood pressure medication change.  However she did not take it this morning.  She used to be on a fluid pill but she is no longer on that.  Denies any history of heart failure.  No history of CAD.  History of high cholesterol, hypertension, diabetes, anxiety.  Denies any infectious symptoms.  No wheezing.  Overall she is asymptomatic upon my evaluation.  Her main concern seems that she is had some elevated blood pressures here today with a mild headache.  Denies any weakness or numbness or vision changes or speech changes.  She has a list of blood pressures that she has been taking over the course of the last month.  The history is provided by the patient.       Home Medications Prior to Admission medications   Medication Sig Start Date End Date Taking? Authorizing Provider  acetaminophen (TYLENOL) 500 MG tablet Take 1,000 mg by mouth 2 (two) times daily as needed (pain).   Yes [provider]  budesonide (ENTOCORT EC) 3 MG 24 hr capsule Take 3 mg by mouth daily.   Yes [provider]  CALCIUM PO Take 500 mg by mouth at bedtime.   Yes [provider]  cyanocobalamin (VITAMIN B12) 1000 MCG tablet Take 1,000 mcg by mouth daily.   Yes [provider]  desonide (DESOWEN) 0.05 % cream Apply 1 Application topically daily as needed for rash. 11/15/21  Yes [provider]  hyoscyamine (LEVSIN) 0.125 MG tablet Take 0.125 mg by mouth 2 (two) times daily as needed for cramping.  01/04/20  Yes [provider]  levothyroxine (SYNTHROID) 50 MCG tablet Take 50 mcg by mouth every morning. 01/20/22  Yes [provider]  mesalamine (APRISO) 0.375 g 24 hr capsule Take 4 capsules by mouth daily. 08/28/18  Yes [provider]  metFORMIN (GLUCOPHAGE-XR) 500 MG 24 hr tablet Take 3 tablets (1,500 mg total) by mouth daily. 04/05/18  Yes Randel Pigg, [name], MD  Probiotic Product (ALIGN PO) Take 1 tablet by mouth daily.   Yes [provider]  simethicone (MYLICON) 125 MG chewable tablet Chew 125 mg by mouth every 6 (six) hours as needed for flatulence.   Yes [provider]  simvastatin (ZOCOR) 20 MG tablet Take 20 mg by mouth every evening.  08/05/13  Yes [provider]  tizanidine (ZANAFLEX) 2 MG capsule Take 1-2 capsules (2-4 mg total) by mouth 3 (three) times daily. Patient taking differently: Take 2-4 mg by mouth 3 (three) times daily as needed for muscle spasms. 10/03/20  Yes [name], FNP  valsartan-hydrochlorothiazide (DIOVAN-HCT) 160-12.5 MG tablet Take 1 tablet by mouth daily. 01/29/22  Yes [provider]  furosemide (LASIX) 20 MG tablet TAKE 1 TABLET BY MOUTH EVERY DAY Patient not taking: Reported on 02/22/2022 01/03/22   [name], MD  Nebivolol HCl 20 MG TABS Take 20 mg by mouth daily. Patient not taking: Reported on 02/22/2022 09/27/21  [provider]  valsartan (DIOVAN) 160 MG tablet Take 160 mg by mouth daily. Patient not taking: Reported on 02/22/2022    [provider]      Allergies    Ace inhibitors, Ciprofloxacin, Doxycycline, and Shrimp [shellfish allergy]    Review of Systems   Review of Systems  Physical Exam Updated Vital Signs BP (!) 148/130   Pulse (!) 111   Temp 98.2 F (36.8 C) (Oral)   Resp 18   Ht 5\' 4"  (1.626 m)   Wt 66.2 kg   SpO2 98%   BMI 25.06 kg/m  Physical Exam Vitals and nursing note reviewed.  Constitutional:      General: She is not in acute  distress.    Appearance: She is well-developed. She is not ill-appearing.  HENT:     Head: Normocephalic and atraumatic.     Nose: Nose normal.     Mouth/Throat:     Mouth: Mucous membranes are moist.  Eyes:     Extraocular Movements: Extraocular movements intact.     Conjunctiva/sclera: Conjunctivae normal.     Pupils: Pupils are equal, round, and reactive to light.  Cardiovascular:     Rate and Rhythm: Normal rate and regular rhythm.     Heart sounds: No murmur heard. Pulmonary:     Effort: Pulmonary effort is normal. No respiratory distress.     Breath sounds: Normal breath sounds.  Abdominal:     Palpations: Abdomen is soft.     Tenderness: There is no abdominal tenderness.  Musculoskeletal:        General: No swelling.     Cervical back: Normal range of motion and neck supple.     Right lower leg: No edema.     Left lower leg: No edema.  Skin:    General: Skin is warm and dry.     Capillary Refill: Capillary refill takes less than 2 seconds.  Neurological:     General: No focal deficit present.     Mental Status: She is alert and oriented to person, place, and time.     Cranial Nerves: No cranial nerve deficit.     Sensory: No sensory deficit.     Motor: No weakness.     Coordination: Coordination normal.     Comments: Normal speech, 5+ out of 5 strength throughout, normal visual fields, normal sensation  Psychiatric:        Mood and Affect: Mood normal.     ED Results / Procedures / Treatments   Labs (all labs ordered are listed, but only abnormal results are displayed) Labs Reviewed  CBC WITH DIFFERENTIAL/PLATELET - Abnormal; Notable for the following components:      Result Value   Lymphs Abs 0.6 (*)    All other components within normal limits  COMPREHENSIVE METABOLIC PANEL - Abnormal; Notable for the following components:   Sodium 132 (*)    Glucose, Bld 131 (*)    BUN 7 (*)    All other components within normal limits  BRAIN NATRIURETIC PEPTIDE -  Abnormal; Notable for the following components:   B Natriuretic Peptide 225.3 (*)    All other components within normal limits  TROPONIN I (HIGH SENSITIVITY)  TROPONIN I (HIGH SENSITIVITY)    EKG EKG Interpretation  Date/Time:  Thursday February 22 2022 08:44:07 EDT Ventricular Rate:  94 PR Interval:  169 QRS Duration: 79 QT Interval:  341 QTC Calculation: 427 R Axis:   11 Text Interpretation: Sinus rhythm Confirmed by  Virgina Norfolk [phone]) on 02/22/2022 9:06:57 AM  Radiology CT Head Wo Contrast  Result Date: 02/22/2022 CLINICAL DATA:  Headache, new or worrisome. EXAM: CT HEAD WITHOUT CONTRAST TECHNIQUE: Contiguous axial images were obtained from the base of the skull through the vertex without intravenous contrast. RADIATION DOSE REDUCTION: This exam was performed according to the departmental dose-optimization program which includes automated exposure control, adjustment of the mA and/or kV according to patient size and/or use of iterative reconstruction technique. COMPARISON:  MRI examination dated October 07, 2021 FINDINGS: Brain: No evidence of acute infarction, hemorrhage, hydrocephalus, extra-axial collection or mass lesion/mass effect. Prominence of the ventricles and sulci secondary to moderate cerebral volume loss. Low-attenuation of the periventricular and subcortical white matter presumed chronic microvascular ischemic changes, unchanged. Vascular: No hyperdense vessel or unexpected calcification. Skull: Normal. Negative for fracture or focal lesion. Sinuses/Orbits: No acute finding.  Bilateral cataract surgery. Other: None. IMPRESSION: 1.  No acute intracranial abnormality. 2. 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 Electronically Signed   By: Larose Hires D.O.   On: 02/22/2022 10:18   DG Chest Portable 1 View  Result Date: 02/22/2022 CLINICAL DATA:  Shortness of breath.  Chest tightness. EXAM: PORTABLE CHEST 1  VIEW COMPARISON:  Two-view chest x-ray 02/16/2020 FINDINGS: The heart size and mediastinal contours are within normal limits. Both lungs are clear. The visualized skeletal structures are unremarkable. IMPRESSION: No active disease. Electronically Signed   By: Marin Roberts M.D.   On: 02/22/2022 09:14    Procedures Procedures    Medications Ordered in ED Medications  irbesartan (AVAPRO) tablet 150 mg (150 mg Oral Given 02/22/22 0958)  ibuprofen (ADVIL) tablet 400 mg (400 mg Oral Patient Refused/Not Given 02/22/22 1208)  hydrochlorothiazide (HYDRODIURIL) tablet 12.5 mg (12.5 mg Oral Given 02/22/22 0957)  acetaminophen (TYLENOL) tablet 650 mg (650 mg Oral Given 02/22/22 1132)    ED Course/ Medical Decision Making/ A&P                           Medical Decision Making Amount and/or Complexity of Data Reviewed Labs: ordered. Radiology: ordered.  Risk OTC drugs. Prescription drug management.   FLARA STORTI is here with shortness of breath and high blood pressure.  History of high cholesterol, hypertension, diabetes, anxiety.  He has been having some shortness of breath symptoms at times over the last month.  May be some chest discomfort but none currently.  Nothing makes it worse or better.  She did not take her blood pressure medication this morning.  Blood pressure 207/105.  She has a log of her blood pressures over the course of the last month and overall she is in decent range mostly between 130 and 150 systolic and under 90 diastolic.  She has no symptoms currently.  She woke up feeling short of breath this morning but that has improved.  She has not had any chest pain.  Sometimes she will have chest discomfort.  Just saw her primary care doctor and they adjusted her blood pressure medicine.  No history of CAD.  Overall atypical story for ACS.  She has no PE risk factors and Wells criteria 0 and doubt PE.  Differential diagnoses includes possibly cardiac process/ACS, less likely  infectious process, anemia, electrolyte abnormality.  Overall seems like she is here for asymptomatic hypertension.  Will evaluate for cardiac or pulmonary process.  Overall Per my review and interpretation of labs there  is no significant electrolyte abnormality, kidney injury, leukocytosis.  Troponin negative x2.  BNP overall unremarkable.  Chest x-ray per my review and interpretation shows no signs of volume overload or pneumonia or pneumothorax.  Head CT is unremarkable.  She did have per my review of her chart and MRI several months ago for similar memory issues, high blood pressure.  This was unremarkable.  She is not having any stroke symptoms.  She does not have any signs of volume overload.  Blood pressure still mildly elevated but when I reviewed her blood pressure recorded over the past month they have been mostly within normal limits.  Her headache has improved with Tylenol and ibuprofen.  Overall at this time I feel like she can continue to monitor her blood pressure at home and log it.  We will have her follow-up with her primary care doctor.  Talked with her son on the phone he is aware of this plan.  Discharge.  This chart was dictated using voice recognition software.  Despite best efforts to proofread,  errors can occur which can change the documentation meaning.         Final Clinical Impression(s) / ED Diagnoses Final diagnoses:  Shortness of breath    Rx / DC Orders ED Discharge Orders     None         Lennice Sites, DO 02/22/22 1212

## 2022-02-22 NOTE — Progress Notes (Signed)
Attempted to do nursing admission history. Pt not able to answer all the questions. Pt's rn informed. Did what could of nursing admission hx. Darcel Smalling BSN, RN-BC Throughput Nurse 02/22/2022 6:57 PM

## 2022-02-22 NOTE — Discharge Instructions (Addendum)
Follow-up with primary care doctor.   Low-Sodium Nutrition Therapy Eating less sodium can help you if you have high blood pressure, heart failure, or kidney or liver disease. Your body needs a little sodium, but too much sodium can cause your body to hold onto extra water.  This extra water will raise your blood pressure and can cause damage to your heart, kidneys, or liver as they are forced to work harder. Sometimes you can see how the extra fluid affects you because your hands, legs, or belly swell.  You may also hold water around your heart and lungs, which makes it hard to breathe. Even if you take medication for blood pressure or a water pill (diuretic) to remove fluid, it is still important to have less salt in your diet. Check with your primary care provider before drinking alcohol since it may affect the amount of fluid in your body and how your heart, kidneys, or liver work.  Sodium in Food A low-sodium meal plan limits the sodium that you get from food and beverages to 1,500-2,000 milligrams (mg) per day. Salt is the main source of sodium.  Read the nutrition label on the package to find out how much sodium is in one serving of a food. Select foods with 140 milligrams (mg) of sodium or less per serving. You may be able to eat one or two servings of foods with a little more than 140 milligrams (mg) of sodium if you are closely watching how much sodium you eat in a day. Check the serving size on the label. The amount of sodium listed on the label shows the amount in one serving of the food. So, if you eat more than one serving, you will get more sodium than the amount listed.  Cutting Back on Sodium Eat more fresh foods. Fresh fruits and vegetables are low in sodium, as well as frozen vegetables and fruits that have no added juices or sauces. Fresh meats are lower in sodium than processed meats, such as bacon, sausage, and hotdogs. Not all processed foods are unhealthy, but some  processed foods may have too much sodium. Eat less salt at the table and when cooking.  One of the ingredients in salt is sodium. One teaspoon of table salt has 2,300 milligrams of sodium. Leave the salt out of recipes for pasta, casseroles, and soups. Be a Engineer, building services. Food packages that say "Salt-free", sodium-free", "very low sodium," and "low sodium" have less than 140 milligrams of sodium per serving. Beware of products identified as "Unsalted," "No Salt Added," "Reduced Sodium," or "Lower Sodium."  These items may still be high in sodium. You should always check the nutrition label. Add flavors to your food without adding sodium. Try lemon juice, lime juice, or vinegar. Dry or fresh herbs add flavor. Buy a sodium-free seasoning blend or make your own at home. You can purchase salt-free or sodium-free condiments like barbeque sauce in stores and online.   Eating in Restaurants Choose foods carefully when you eat outside your home. Restaurant foods can be very high in sodium.  Many restaurants provide nutrition facts on their menus or their websites. If you cannot find that information, ask your server.  Let your server know that you want your food to be cooked without salt and that you would like your salad dressing and sauces to be served on the side.  Foods Recommended Grains Bread and rolls without salted tops Homemade bread made with reduced-sodium baking powder Cold cereals, especially shredded  wheat and puffed rice Oats, grits, or cream of wheat Pastas, quinoa, and rice Popcorn, pretzels or crackers without salt Corn tortillas Protein Foods Fresh meats and fish (check the nutrition labels - make sure they are not packaged in a sodium solution) Canned or packed tuna (no more than 4 ounces at 1 serving) Beans and peas Soybeans and tofu Eggs Nuts or nut butters without salt Dairy Milk or milk powder Plant milks, such as rice and soy Yogurt, including Greek  yogurt Small amounts of natural cheese (blocks of cheese) or reduced-sodium cheese can be used in moderation.  (Swiss, ricotta, and fresh mozzarella cheese are lower in sodium than the others) Cream Cheese Low sodium cottage cheese Vegetables Fresh and frozen vegetables without added sauces or salt Homemade soups (without salt) Low-sodium, salt-free or sodium-free canned vegetables and soups Fruit Fresh and canned fruits Dried fruits, such as raisins, cranberries, and prunes Oils Tub or liquid margarine, regular or without salt Canola, corn, peanut, olive, safflower, or sunflower oils Condiments Fresh or dried herbs such as basil, bay leaf, dill, mustard (dry), nutmeg, paprika, parsley, rosemary, sage, or thyme. Low sodium ketchup Vinegar Lemon or lime juice Pepper, red pepper flakes, and cayenne. Hot sauce contains sodium, but if you use just a drop or two, it will not add up to much. Salt-free or sodium-free seasoning mixes and marinades Simple salad dressings: vinegar and oil  Foods Not Recommended Grains Breads or crackers topped with salt Cereals (hot/cold) with more than 300 mg sodium per serving Biscuits, cornbread, and other "quick" breads prepared with baking soda Pre-packaged bread crumbs Seasoned and packaged rice and pasta mixes Self-rising flours Protein Foods Cured meats: Bacon, ham, sausage, pepperoni and hot dogs Canned meats (chili, vienna sausage, or sardines) Smoked fish and meats Frozen meals that have more than 600 mg of sodium per serving Egg substitute (with added sodium) Dairy Buttermilk Processed cheese spreads Cottage cheese (1 cup may have over 500 mg of sodium; look for low-sodium.) American or feta cheese Shredded cheese has more sodium than blocks of cheese String cheese Vegetables Canned vegetables (unless they are salt-free, sodium-free or low sodium) Frozen vegetables with seasoning and sauces Sauerkraut and pickled vegetables Canned  or dried soups (unless they are salt-free, sodium-free, or low sodium) [place] fries and onion rings Fruit  Dried fruits preserved with additives that have sodium Oils  Salted butter or margarine, all types of olives Condiments Salt, sea salt, kosher salt, onion salt, and garlic salt Seasoning mixes with salt Bouillon cubes Ketchup Barbeque sauce and Worcestershire sauce unless low sodium Soy sauce Salsa, pickles, olives, relish Salad dressings: ranch, blue cheese, [place], and [place].

## 2022-02-22 NOTE — ED Notes (Signed)
Pt to MRI

## 2022-02-22 NOTE — ED Notes (Signed)
Attending s. KIM notified of pt being anxious and shaking, checked CBG was 215. Patient states chest hurts nitro admin, patient vomiting at this time. MD will enter orders for Zofran.

## 2022-02-22 NOTE — ED Notes (Signed)
Pt vomiting. Cool washcloth applied to forehead

## 2022-02-22 NOTE — ED Triage Notes (Signed)
Pt. Arrived via [facility] from home. Pt. C/o SOB and chest tightness over the last month with exertion. Pt. Awoken this morning and experiencing SOB and tightness without exertion and called EMS

## 2022-02-22 NOTE — ED Notes (Signed)
Pharmacy called for update on BP medication. Pharmacy stated they are in the process of tubing

## 2022-02-22 NOTE — ED Notes (Signed)
Provider Dr. Lockie Mola notified of High BP reading and is at the bedside to assess.

## 2022-02-22 NOTE — H&P (Signed)
History and Physical  Kristina [name] XBM:[id number] DOB: 07/10/1946 DOA: 02/22/2022  PCP: Pcp, No Patient coming from: Home   I have personally briefly reviewed patient's old medical records in [facility]   Chief Complaint: chest pain/SOB   HPI: Kristina [name] is a 76 y.o. female past medical history significant for labile hypertension, hyperlipidemia, diabetes type 2 , hypothyroidism, chronic diastolic heart failure, prior cardiac CT 2019 Calcium  score 122, 68 percentile Presents complaining of shortness of breath and chest pain at rest when she wake up this morning.  She reports chest pain and shortness of breath for the last month especially on exertion.  She relates that the chest pain and tightness has been progressively getting worse over the last  weeks.  This morning she developed worsening chest pain and dyspnea at rest.  She presented for further evaluation.  She is supposed to have a PET Cardiac perfusion in  August.  She also noticed difficulty finding words since this morning, she is also feeling confused.  Her son noticed that she is foggy and having difficulty expressing herself.  She had some difficulty trying to figure out how to make a phone call from her phone, which she normally is be able to do.  She is also complaining of the worst headache of her life.  She reporter occipital headache.  The headache is started 2 days prior to admission.  She also has a history of whitecoat hypertension.  Her blood pressure is also labile.  Sometimes her blood pressure  drop and she can feel it because she feels weak.   She feels short of breath, she feels like if she needs oxygen.   Evaluation in the ED: Sodium 132, potassium 4.1, glucose 131, creatinine 0.  0.5, normal liver function test, BNP 225, troponin 5x2, hemoglobin 13 white blood cell 5. CT head no acute intracranial abnormality.  Chest x-ray: No active disease. EKG; sinus tachycardia.   Patient was found to have  elevation of blood pressure with systolic blood pressure 207/105.   Review of Systems: All systems reviewed and apart from history of presenting illness, are negative.  Past Medical History:  Diagnosis Date   Anxiety    Asthma    Diabetes (HCC)    Diabetes mellitus without complication (HCC)    Diverticulitis    GERD (gastroesophageal reflux disease)    HTN (hypertension)    Hypercholesteremia    Hyperlipidemia    Hypertension    Obesity    Rosacea    Thyroid disease    Ulcerative colitis (HCC)    Past Surgical History:  Procedure Laterality Date   ACHILLES TENDON REPAIR     APPENDECTOMY     BIOPSY  10/16/2018   Procedure: BIOPSY;  Surgeon: Kathi Der, MD;  Location: [facility];  Service: Gastroenterology;;   CESAREAN SECTION     CHOLECYSTECTOMY     FLEXIBLE SIGMOIDOSCOPY N/A 10/16/2018   Procedure: FLEXIBLE SIGMOIDOSCOPY;  Surgeon: Kathi Der, MD;  Location: [facility];  Service: Gastroenterology;  Laterality: N/A;   hammertoe     Social History:  reports that she has never smoked. She has never used smokeless tobacco. She reports that she does not drink alcohol and does not use drugs.   Allergies  Allergen Reactions   Ace Inhibitors Cough   Ciprofloxacin Other (See Comments)    Tendinitis in heel and posterior right LE Tendon rupture   Doxycycline Other (See Comments)    Pt does not  remember what the reaction was - many years ago   Shrimp [Shellfish Allergy] Diarrhea, Nausea And Vomiting and Rash    Family History  Problem Relation Age of Onset   Hypertension Mother    Heart failure Mother    Heart attack Mother    Hypotension Father    Dementia Father     Prior to Admission medications   Medication Sig Start Date End Date Taking? Authorizing Provider  acetaminophen (TYLENOL) 500 MG tablet Take 1,000 mg by mouth 2 (two) times daily as needed (pain).   Yes [provider]  budesonide (ENTOCORT EC) 3 MG 24 hr capsule Take 3 mg by  mouth daily.   Yes [provider]  CALCIUM PO Take 500 mg by mouth at bedtime.   Yes [provider]  cyanocobalamin (VITAMIN B12) 1000 MCG tablet Take 1,000 mcg by mouth daily.   Yes [provider]  desonide (DESOWEN) 0.05 % cream Apply 1 Application topically daily as needed for rash. 11/15/21  Yes [provider]  hyoscyamine (LEVSIN) 0.125 MG tablet Take 0.125 mg by mouth 2 (two) times daily as needed for cramping. 01/04/20  Yes [provider]  levothyroxine (SYNTHROID) 50 MCG tablet Take 50 mcg by mouth every morning. 01/20/22  Yes [provider]  mesalamine (APRISO) 0.375 g 24 hr capsule Take 4 capsules by mouth daily. 08/28/18  Yes [provider]  metFORMIN (GLUCOPHAGE-XR) 500 MG 24 hr tablet Take 3 tablets (1,500 mg total) by mouth daily. 04/05/18  Yes Randel Pigg, [name], MD  Probiotic Product (ALIGN PO) Take 1 tablet by mouth daily.   Yes [provider]  simethicone (MYLICON) 125 MG chewable tablet Chew 125 mg by mouth every 6 (six) hours as needed for flatulence.   Yes [provider]  simvastatin (ZOCOR) 20 MG tablet Take 20 mg by mouth every evening.  08/05/13  Yes [provider]  tizanidine (ZANAFLEX) 2 MG capsule Take 1-2 capsules (2-4 mg total) by mouth 3 (three) times daily. Patient taking differently: Take 2-4 mg by mouth 3 (three) times daily as needed for muscle spasms. 10/03/20  Yes [name], FNP  valsartan-hydrochlorothiazide (DIOVAN-HCT) 160-12.5 MG tablet Take 1 tablet by mouth daily. 01/29/22  Yes [provider]  furosemide (LASIX) 20 MG tablet TAKE 1 TABLET BY MOUTH EVERY DAY Patient not taking: Reported on 02/22/2022 01/03/22   [name], MD  Nebivolol HCl 20 MG TABS Take 20 mg by mouth daily. Patient not taking: Reported on 02/22/2022 09/27/21   [provider]  valsartan (DIOVAN) 160 MG tablet Take 160 mg by mouth daily. Patient not taking: Reported on  02/22/2022    [provider]   Physical Exam: Vitals:   02/22/22 1645 02/22/22 1723 02/22/22 1725 02/22/22 1745  BP: (!) 187/109 (!) 192/111  (!) 186/90  Pulse: (!) 108 (!) 108  (!) 104  Resp: 15 (!) 24  15  Temp:   98.6 F (37 C)   TempSrc:   Oral   SpO2: 99% 98%  99%  Weight:      Height:        General exam: Moderately built and nourished patient, lying comfortably supine on the gurney,  anxious.  Head, eyes and ENT: Nontraumatic and normocephalic. Pupils equally reacting to light and accommodation. Oral mucosa moist. Neck: Supple. No JVD, carotid bruit or thyromegaly. Lymphatics: No lymphadenopathy. Respiratory system: Clear to auscultation. No increased work of breathing. Cardiovascular system: S1 and S2 heard,  RRR.  Gastrointestinal system: Abdomen is nondistended, soft and nontender. Normal bowel sounds heard. No organomegaly or masses appreciated. Central nervous system: Alert and oriented. Having some trouble finding words.  Motor strength 5/5.  Extremities: Symmetric 5 x 5 power. Peripheral pulses symmetrically felt.  Skin: No rashes or acute findings. Musculoskeletal system: Negative exam. Psychiatry: Pleasant and cooperative.   Labs on Admission:  Basic Metabolic Panel: Recent Labs  Lab 02/22/22 0851  NA 132*  K 4.1  CL 99  CO2 22  GLUCOSE 131*  BUN 7*  CREATININE 0.57  CALCIUM 9.4   Liver Function Tests: Recent Labs  Lab 02/22/22 0851  AST 19  ALT 12  ALKPHOS 52  BILITOT 0.9  PROT 6.8  ALBUMIN 3.9   No results for input(s): "LIPASE", "AMYLASE" in the last 168 hours. No results for input(s): "AMMONIA" in the last 168 hours. CBC: Recent Labs  Lab 02/22/22 0851  WBC 5.0  NEUTROABS 4.0  HGB 13.3  HCT 37.1  MCV 92.8  PLT 200   Cardiac Enzymes: No results for input(s): "CKTOTAL", "CKMB", "CKMBINDEX", "TROPONINI" in the last 168 hours.  BNP (last 3 results) No results for input(s): "PROBNP" in the last 8760 hours. CBG: No  results for input(s): "GLUCAP" in the last 168 hours.  Radiological Exams on Admission: CT Head Wo Contrast  Result Date: 02/22/2022 CLINICAL DATA:  Headache, new or worrisome. EXAM: CT HEAD WITHOUT CONTRAST TECHNIQUE: Contiguous axial images were obtained from the base of the skull through the vertex without intravenous contrast. RADIATION DOSE REDUCTION: This exam was performed according to the departmental dose-optimization program which includes automated exposure control, adjustment of the mA and/or kV according to patient size and/or use of iterative reconstruction technique. COMPARISON:  MRI examination dated October 07, 2021 FINDINGS: Brain: No evidence of acute infarction, hemorrhage, hydrocephalus, extra-axial collection or mass lesion/mass effect. Prominence of the ventricles and sulci secondary to moderate cerebral volume loss. Low-attenuation of the periventricular and subcortical white matter presumed chronic microvascular ischemic changes, unchanged. Vascular: No hyperdense vessel or unexpected calcification. Skull: Normal. Negative for fracture or focal lesion. Sinuses/Orbits: No acute finding.  Bilateral cataract surgery. Other: None. IMPRESSION: 1.  No acute intracranial abnormality. 2. 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 Electronically Signed   By: Larose Hires D.O.   On: 02/22/2022 10:18   DG Chest Portable 1 View  Result Date: 02/22/2022 CLINICAL DATA:  Shortness of breath.  Chest tightness. EXAM: PORTABLE CHEST 1 VIEW COMPARISON:  Two-view chest x-ray 02/16/2020 FINDINGS: The heart size and mediastinal contours are within normal limits. Both lungs are clear. The visualized skeletal structures are unremarkable. IMPRESSION: No active disease. Electronically Signed   By: Marin Roberts M.D.   On: 02/22/2022 09:14    EKG: Independently reviewed. Sinus tachycardia.   Assessment/Plan Principal Problem:    HTN (hypertension), malignant Active Problems:   Diabetes mellitus without complication (HCC)   HTN (hypertension)   Hyponatremia   Thyroid disease   Hypercholesteremia   GERD (gastroesophageal reflux disease)   Chest pain   1-Malignant HTN;  Patient presents with a systolic blood pressure 207/105, shortness of breath, chest pain, confusion, difficulty finding words. SBP has decreased to 186/90. She has received hydrochlorothiazide and Avapro this morning. Continue.  I will resume by systolic. (Of note this medication was discontinue by PCP, BP medications recently changes) -IV hydralazine and IV labetalol ordered. -Admit to  stepdown bed.  2-Chest pain, shortness of breath:  Could be related to high blood pressure. Troponin x2 negative Also Concern with angina, cardiology has been consulted.  PRN nitroglycerine ordered.  D dimer not significantly elevated.  Resume Zocor. Started baby aspirin.  Chest x ray negative. Mildly elevated BNP. Place on 2 L oxygen for comfort.   3-Headaches, Acute metabolic encephalopathy, Difficulty finding words.  Suspect related to HTN/.  CT head:  Will proceed with MRI/MRA brain.  Tramadol PRN ordered.   4-Hypothyroidism; resume Synthroid.  5-DM type; SSI.  Hold home metformin.  6-Chronic diastolic HF; does not appears to be volume overload.  Chest x ray negative. Mildly elevated BNP.  7-Mild Hyponatremia; monitor.        DVT Prophylaxis: Lovenox Code Status: Full code Family Communication: care discussed with Son , who was at bedside.  Disposition Plan: admit for evaluation of chest pain and malignant HTN.   Time spent: 75 minutes  Alba Cory MD [facility]   02/22/2022, 6:15 PM

## 2022-02-23 ENCOUNTER — Observation Stay (HOSPITAL_COMMUNITY): Payer: Medicare PPO

## 2022-02-23 ENCOUNTER — Other Ambulatory Visit (HOSPITAL_COMMUNITY): Payer: Medicare PPO

## 2022-02-23 DIAGNOSIS — Z8249 Family history of ischemic heart disease and other diseases of the circulatory system: Secondary | ICD-10-CM | POA: Diagnosis not present

## 2022-02-23 DIAGNOSIS — R8281 Pyuria: Secondary | ICD-10-CM | POA: Diagnosis present

## 2022-02-23 DIAGNOSIS — K224 Dyskinesia of esophagus: Secondary | ICD-10-CM | POA: Diagnosis present

## 2022-02-23 DIAGNOSIS — Z888 Allergy status to other drugs, medicaments and biological substances status: Secondary | ICD-10-CM | POA: Diagnosis not present

## 2022-02-23 DIAGNOSIS — R079 Chest pain, unspecified: Secondary | ICD-10-CM | POA: Diagnosis not present

## 2022-02-23 DIAGNOSIS — K2289 Other specified disease of esophagus: Secondary | ICD-10-CM | POA: Diagnosis present

## 2022-02-23 DIAGNOSIS — I161 Hypertensive emergency: Secondary | ICD-10-CM | POA: Diagnosis present

## 2022-02-23 DIAGNOSIS — R0602 Shortness of breath: Secondary | ICD-10-CM | POA: Diagnosis present

## 2022-02-23 DIAGNOSIS — I5032 Chronic diastolic (congestive) heart failure: Secondary | ICD-10-CM

## 2022-02-23 DIAGNOSIS — E7800 Pure hypercholesterolemia, unspecified: Secondary | ICD-10-CM | POA: Diagnosis present

## 2022-02-23 DIAGNOSIS — Z881 Allergy status to other antibiotic agents status: Secondary | ICD-10-CM | POA: Diagnosis not present

## 2022-02-23 DIAGNOSIS — I674 Hypertensive encephalopathy: Secondary | ICD-10-CM | POA: Diagnosis present

## 2022-02-23 DIAGNOSIS — I959 Hypotension, unspecified: Secondary | ICD-10-CM | POA: Diagnosis not present

## 2022-02-23 DIAGNOSIS — R7989 Other specified abnormal findings of blood chemistry: Secondary | ICD-10-CM | POA: Diagnosis present

## 2022-02-23 DIAGNOSIS — E119 Type 2 diabetes mellitus without complications: Secondary | ICD-10-CM | POA: Diagnosis present

## 2022-02-23 DIAGNOSIS — F419 Anxiety disorder, unspecified: Secondary | ICD-10-CM | POA: Diagnosis present

## 2022-02-23 DIAGNOSIS — G9340 Encephalopathy, unspecified: Secondary | ICD-10-CM | POA: Diagnosis not present

## 2022-02-23 DIAGNOSIS — I2510 Atherosclerotic heart disease of native coronary artery without angina pectoris: Secondary | ICD-10-CM | POA: Diagnosis present

## 2022-02-23 DIAGNOSIS — R519 Headache, unspecified: Secondary | ICD-10-CM

## 2022-02-23 DIAGNOSIS — Z79890 Hormone replacement therapy (postmenopausal): Secondary | ICD-10-CM | POA: Diagnosis not present

## 2022-02-23 DIAGNOSIS — Z79899 Other long term (current) drug therapy: Secondary | ICD-10-CM | POA: Diagnosis not present

## 2022-02-23 DIAGNOSIS — I10 Essential (primary) hypertension: Secondary | ICD-10-CM | POA: Diagnosis not present

## 2022-02-23 DIAGNOSIS — I110 Hypertensive heart disease with heart failure: Secondary | ICD-10-CM | POA: Diagnosis present

## 2022-02-23 DIAGNOSIS — R0789 Other chest pain: Secondary | ICD-10-CM

## 2022-02-23 DIAGNOSIS — Z7984 Long term (current) use of oral hypoglycemic drugs: Secondary | ICD-10-CM | POA: Diagnosis not present

## 2022-02-23 DIAGNOSIS — Z91013 Allergy to seafood: Secondary | ICD-10-CM | POA: Diagnosis not present

## 2022-02-23 DIAGNOSIS — J45909 Unspecified asthma, uncomplicated: Secondary | ICD-10-CM | POA: Diagnosis present

## 2022-02-23 DIAGNOSIS — E039 Hypothyroidism, unspecified: Secondary | ICD-10-CM | POA: Diagnosis present

## 2022-02-23 DIAGNOSIS — K219 Gastro-esophageal reflux disease without esophagitis: Secondary | ICD-10-CM | POA: Diagnosis present

## 2022-02-23 DIAGNOSIS — E871 Hypo-osmolality and hyponatremia: Secondary | ICD-10-CM | POA: Diagnosis not present

## 2022-02-23 LAB — COMPREHENSIVE METABOLIC PANEL
ALT: 14 U/L (ref 0–44)
AST: 22 U/L (ref 15–41)
Albumin: 4.2 g/dL (ref 3.5–5.0)
Alkaline Phosphatase: 59 U/L (ref 38–126)
Anion gap: 16 — ABNORMAL HIGH (ref 5–15)
BUN: 11 mg/dL (ref 8–23)
CO2: 20 mmol/L — ABNORMAL LOW (ref 22–32)
Calcium: 9.7 mg/dL (ref 8.9–10.3)
Chloride: 89 mmol/L — ABNORMAL LOW (ref 98–111)
Creatinine, Ser: 0.56 mg/dL (ref 0.44–1.00)
GFR, Estimated: >60 mL/min (ref >60)
Glucose, Bld: 222 mg/dL — ABNORMAL HIGH (ref 70–99)
Potassium: 3.5 mmol/L (ref 3.5–5.1)
Sodium: 125 mmol/L — ABNORMAL LOW (ref 135–145)
Total Bilirubin: 1.1 mg/dL (ref 0.3–1.2)
Total Protein: 7.2 g/dL (ref 6.5–8.1)

## 2022-02-23 LAB — BASIC METABOLIC PANEL
Anion gap: 13 (ref 5–15)
Anion gap: 12 (ref 5–15)
BUN: 13 mg/dL (ref 8–23)
BUN: 15 mg/dL (ref 8–23)
CO2: 19 mmol/L — ABNORMAL LOW (ref 22–32)
CO2: 23 mmol/L (ref 22–32)
Calcium: 9.2 mg/dL (ref 8.9–10.3)
Calcium: 9.5 mg/dL (ref 8.9–10.3)
Chloride: 85 mmol/L — ABNORMAL LOW (ref 98–111)
Chloride: 83 mmol/L — ABNORMAL LOW (ref 98–111)
Creatinine, Ser: 0.56 mg/dL (ref 0.44–1.00)
Creatinine, Ser: 0.67 mg/dL (ref 0.44–1.00)
GFR, Estimated: >60 mL/min (ref >60)
GFR, Estimated: >60 mL/min (ref >60)
Glucose, Bld: 225 mg/dL — ABNORMAL HIGH (ref 70–99)
Glucose, Bld: 223 mg/dL — ABNORMAL HIGH (ref 70–99)
Potassium: 3.4 mmol/L — ABNORMAL LOW (ref 3.5–5.1)
Potassium: 3.5 mmol/L (ref 3.5–5.1)
Sodium: 117 mmol/L — CL (ref 135–145)
Sodium: 118 mmol/L — CL (ref 135–145)

## 2022-02-23 LAB — SODIUM, URINE, RANDOM: Sodium, Ur: 123 mmol/L

## 2022-02-23 LAB — BLOOD GAS, ARTERIAL
Acid-Base Excess: 1.5 mmol/L (ref 0.0–2.0)
Bicarbonate: 24.7 mmol/L (ref 20.0–28.0)
Drawn by: 31394
FIO2: 0.21 %
O2 Saturation: 98.7 %
Patient temperature: 36.9
pCO2 arterial: 34 mmHg (ref 32–48)
pH, Arterial: 7.47 — ABNORMAL HIGH (ref 7.35–7.45)
pO2, Arterial: 103 mmHg (ref 83–108)

## 2022-02-23 LAB — CBC
HCT: 38.8 % (ref 36.0–46.0)
Hemoglobin: 14.2 g/dL (ref 12.0–15.0)
MCH: 32.9 pg (ref 26.0–34.0)
MCHC: 36.6 g/dL — ABNORMAL HIGH (ref 30.0–36.0)
MCV: 90 fL (ref 80.0–100.0)
Platelets: 243 10*3/uL (ref 150–400)
RBC: 4.31 MIL/uL (ref 3.87–5.11)
RDW: 12.3 % (ref 11.5–15.5)
WBC: 8.2 10*3/uL (ref 4.0–10.5)
nRBC: 0 % (ref 0.0–0.2)

## 2022-02-23 LAB — ECHOCARDIOGRAM COMPLETE
Area-P 1/2: 3.28 cm2
Calc EF: 54.3 %
Height: 64 in
S' Lateral: 3 cm
Single Plane A2C EF: 58 %
Single Plane A4C EF: 54.2 %
Weight: 2271.62 oz

## 2022-02-23 LAB — TSH: TSH: 2.779 u[IU]/mL (ref 0.350–4.500)

## 2022-02-23 LAB — GLUCOSE, CAPILLARY
Glucose-Capillary: 180 mg/dL — ABNORMAL HIGH (ref 70–99)
Glucose-Capillary: 245 mg/dL — ABNORMAL HIGH (ref 70–99)
Glucose-Capillary: 142 mg/dL — ABNORMAL HIGH (ref 70–99)
Glucose-Capillary: 215 mg/dL — ABNORMAL HIGH (ref 70–99)

## 2022-02-23 LAB — VITAMIN B12: Vitamin B-12: 3388 pg/mL — ABNORMAL HIGH (ref 180–914)

## 2022-02-23 LAB — SODIUM
Sodium: 117 mmol/L — CL (ref 135–145)
Sodium: 118 mmol/L — CL (ref 135–145)
Sodium: 121 mmol/L — ABNORMAL LOW (ref 135–145)

## 2022-02-23 LAB — OSMOLALITY, URINE: Osmolality, Ur: 485 mOsm/kg (ref 300–900)

## 2022-02-23 LAB — HEMOGLOBIN A1C
Hgb A1c MFr Bld: 6.6 % — ABNORMAL HIGH (ref 4.8–5.6)
Mean Plasma Glucose: 142.72 mg/dL

## 2022-02-23 LAB — OSMOLALITY: Osmolality: 250 mOsm/kg — ABNORMAL LOW (ref 275–295)

## 2022-02-23 MED ORDER — AMLODIPINE BESYLATE 5 MG PO TABS
5.0000 mg | ORAL_TABLET | Freq: Two times a day (BID) | ORAL | Status: DC
  Administered 2022-02-23 – 2022-02-24 (×3): 5 mg via ORAL
  Filled 2022-02-23 (×3): qty 1

## 2022-02-23 MED ORDER — SODIUM CHLORIDE 0.9 % IV SOLN: INTRAVENOUS | Status: DC

## 2022-02-23 MED ORDER — PERFLUTREN LIPID MICROSPHERE
1.0000 mL | INTRAVENOUS | Status: AC | PRN
  Administered 2022-02-23: 3 mL via INTRAVENOUS

## 2022-02-23 MED ORDER — METOPROLOL TARTRATE 25 MG PO TABS
100.0000 mg | ORAL_TABLET | Freq: Once | ORAL | Status: AC
  Administered 2022-02-23: 100 mg via ORAL
  Filled 2022-02-23: qty 4

## 2022-02-23 MED ORDER — ADULT MULTIVITAMIN W/MINERALS CH
1.0000 | ORAL_TABLET | Freq: Every day | ORAL | Status: DC
  Administered 2022-02-24 – 2022-02-27 (×4): 1 via ORAL
  Filled 2022-02-23 (×5): qty 1

## 2022-02-23 MED ORDER — SODIUM CHLORIDE 3 % IV SOLN
INTRAVENOUS | Status: DC
  Filled 2022-02-23 (×2): qty 500

## 2022-02-23 MED ORDER — SODIUM CHLORIDE 0.9 % IV SOLN
INTRAVENOUS | Status: DC
Start: 2022-02-23 — End: 2022-02-23

## 2022-02-23 NOTE — Consult Note (Signed)
Cardiology Consultation:   Patient ID: Kristina [name] MRN: [id number]; DOB: 07/17/1946  Admit date: 02/22/2022 Date of Consult: 02/23/2022  PCP:  Oneita Hurt No   [facility] Providers Cardiologist:  Charlton Haws, MD        Patient Profile:   Kristina [name] is a 76 y.o. 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 [name].  History of Present Illness:   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  Coronary CT obtained on 11/02/2017 revealed less than 50% plaque in proximal LAD and ostial to mid RCA, coronary calcium score 122 which placed the patient at 68th percentile for age and sex matched control.  Last echocardiogram obtained on 02/25/2020 showed EF 55%, mid and basal inferior wall hypokinesis, no regional wall motion abnormality, grade 1 DD, mild MR.  She was previously on Norvasc, this was discontinued due to leg edema in November 2021.  Patient was most recently seen by Dr. Eden Emms on 05/03/2021 at which time she was doing well.  Her blood pressure was 160/92.  More recently, she called the cardiology office on 12/26/2021 complaining of intermittent chest discomfort.  PET stress test was ordered and is currently pending on 8/29.  According to patient and the son, several months ago, they began to notice she occasionally has trouble finding words.  In the past 2 months, she has been having intermittent chest discomfort and worsening dyspnea.  Chest discomfort and dyspnea can occur at any time.  She has diligently kept a blood pressure diary since June, with 2  readings per day.  Blood pressure typically was in the 110-150s at home, there was only one reading where her systolic blood pressure was in the high 90s.  She says that she is no longer taking nebivolol 20 mg, this was taken off about 3 months ago.  However I do not see any record on this.  She is still taking valsartan-hydrochlorothiazide at home.  In the past several days, her mental status, chest discomfort and dyspnea gradually worsened to the degree where she decided to seek medical attention at [facility].  She says that she was having chest pain for the entire day on the day she came in.  Serial troponin was negative x2.  Her blood pressure on arrival was 200 systolic.  Initial sodium level was 132.  EKG showed sinus rhythm with no obvious ST-T wave changes.  CT of the head was negative for acute intracranial abnormality, moderate generalized cerebral atrophy and chronic microvascular ischemic changes of the white matter.  MRI and MRA of the brain shows no acute intracranial infarct or other abnormality, mild to moderate chronic microvascular ischemic changes, no large vessel occlusion.  She was restarted on nebivolol 20 mg and valsartan was switched to irbesartan.  Hydrochlorothiazide was initially restarted in the hospital, however was later taken off as her sodium level dropped from 132 down to 125.  Cardiology service consulted for chest pain.   Past Medical History:  Diagnosis Date   Anxiety    Asthma    Diabetes (HCC)    Diabetes mellitus without complication (HCC)    Diverticulitis  GERD (gastroesophageal reflux disease)    HTN (hypertension)    Hypercholesteremia    Hyperlipidemia    Hypertension    Obesity    Rosacea    Thyroid disease    Ulcerative colitis (HCC)     Past Surgical History:  Procedure Laterality Date   ACHILLES TENDON REPAIR     APPENDECTOMY     BIOPSY  10/16/2018   Procedure: BIOPSY;  Surgeon: Kathi Der, MD;  Location: [facility];   Service: Gastroenterology;;   CESAREAN SECTION     CHOLECYSTECTOMY     FLEXIBLE SIGMOIDOSCOPY N/A 10/16/2018   Procedure: FLEXIBLE SIGMOIDOSCOPY;  Surgeon: Kathi Der, MD;  Location: [facility];  Service: Gastroenterology;  Laterality: N/A;   hammertoe       Home Medications:  Prior to Admission medications   Medication Sig Start Date End Date Taking? Authorizing Provider  acetaminophen (TYLENOL) 500 MG tablet Take 1,000 mg by mouth 2 (two) times daily as needed (pain).   Yes [provider]  budesonide (ENTOCORT EC) 3 MG 24 hr capsule Take 3 mg by mouth daily.   Yes [provider]  CALCIUM PO Take 500 mg by mouth at bedtime.   Yes [provider]  cyanocobalamin (VITAMIN B12) 1000 MCG tablet Take 1,000 mcg by mouth daily.   Yes [provider]  desonide (DESOWEN) 0.05 % cream Apply 1 Application topically daily as needed for rash. 11/15/21  Yes [provider]  hyoscyamine (LEVSIN) 0.125 MG tablet Take 0.125 mg by mouth 2 (two) times daily as needed for cramping. 01/04/20  Yes [provider]  levothyroxine (SYNTHROID) 50 MCG tablet Take 50 mcg by mouth every morning. 01/20/22  Yes [provider]  mesalamine (APRISO) 0.375 g 24 hr capsule Take 4 capsules by mouth daily. 08/28/18  Yes [provider]  metFORMIN (GLUCOPHAGE-XR) 500 MG 24 hr tablet Take 3 tablets (1,500 mg total) by mouth daily. 04/05/18  Yes Randel Pigg, [name], MD  Probiotic Product (ALIGN PO) Take 1 tablet by mouth daily.   Yes [provider]  simethicone (MYLICON) 125 MG chewable tablet Chew 125 mg by mouth every 6 (six) hours as needed for flatulence.   Yes [provider]  simvastatin (ZOCOR) 20 MG tablet Take 20 mg by mouth every evening.  08/05/13  Yes [provider]  tizanidine (ZANAFLEX) 2 MG capsule Take 1-2 capsules (2-4 mg total) by mouth 3 (three) times daily. Patient taking differently: Take 2-4 mg by mouth 3  (three) times daily as needed for muscle spasms. 10/03/20  Yes [name], FNP  valsartan-hydrochlorothiazide (DIOVAN-HCT) 160-12.5 MG tablet Take 1 tablet by mouth daily. 01/29/22  Yes [provider]  furosemide (LASIX) 20 MG tablet TAKE 1 TABLET BY MOUTH EVERY DAY Patient not taking: Reported on 02/22/2022 01/03/22   [name], MD  Nebivolol HCl 20 MG TABS Take 20 mg by mouth daily. Patient not taking: Reported on 02/22/2022 09/27/21   [provider]    Inpatient Medications: Scheduled Meds:  aspirin EC  81 mg Oral Daily   budesonide  3 mg Oral Daily   Chlorhexidine Gluconate Cloth  6 each Topical Daily   cyanocobalamin  1,000 mcg Oral Daily   enoxaparin (LOVENOX) injection  40 mg Subcutaneous Q24H   feeding supplement  237 mL Oral BID BM   insulin aspart  0-9 Units Subcutaneous TID WC   irbesartan  150 mg Oral Daily   levothyroxine  50 mcg Oral q morning  nebivolol  20 mg Oral Daily   simvastatin  20 mg Oral QPM   sodium chloride flush  3 mL Intravenous Q12H   Continuous Infusions:  sodium chloride     sodium chloride 50 mL/hr at 02/23/22 0830   PRN Meds: sodium chloride, bisacodyl, hydrALAZINE, hyoscyamine, labetalol, nitroGLYCERIN, ondansetron **OR** ondansetron (ZOFRAN) IV, ondansetron (ZOFRAN) IV, mouth rinse, sodium chloride flush, traMADol  Allergies:    Allergies  Allergen Reactions   Ace Inhibitors Cough   Ciprofloxacin Other (See Comments)    Tendinitis in heel and posterior right LE Tendon rupture   Doxycycline Other (See Comments)    Pt does not remember what the reaction was - many years ago   Shrimp [Shellfish Allergy] Diarrhea, Nausea And Vomiting and Rash    Social History:   Social History   Socioeconomic History   Marital status: Divorced    Spouse name: Not on file   Number of children: Not on file   Years of education: Not on file   Highest education level: Not on file  Occupational History   Not on file  Tobacco Use    Smoking status: Never   Smokeless tobacco: Never  Vaping Use   Vaping Use: Never used  Substance and Sexual Activity   Alcohol use: No    Alcohol/week: 0.0 standard drinks of alcohol   Drug use: No   Sexual activity: Not Currently  Other Topics Concern   Not on file  Social History Narrative   ** Merged History Encounter **       Social Determinants of Health   Financial Resource Strain: Not on file  Food Insecurity: Not on file  Transportation Needs: Not on file  Physical Activity: Not on file  Stress: Not on file  Social Connections: Not on file  Intimate Partner Violence: Not on file    Family History:    Family History  Problem Relation Age of Onset   Hypertension Mother    Heart failure Mother    Heart attack Mother    Hypotension Father    Dementia Father      ROS:  Please see the history of present illness.   All other ROS reviewed and negative.     Physical Exam/Data:   Vitals:   02/23/22 0230 02/23/22 0400 02/23/22 0600 02/23/22 0800  BP: (!) 145/75 120/65 140/83   Pulse: 89 81 85   Resp: (!) 21 16 20    Temp:  98.5 F (36.9 C)  98.5 F (36.9 C)  TempSrc:  Oral  Oral  SpO2: 98% 99% 99%   Weight:      Height:        Intake/Output Summary (Last 24 hours) at 02/23/2022 0854 Last data filed at 02/23/2022 0606 Gross per 24 hour  Intake 500 ml  Output 300 ml  Net 200 ml      02/22/2022    9:08 PM 02/22/2022    8:43 AM 05/03/2021    8:18 AM  Last 3 Weights  Weight (lbs) 141 lb 15.6 oz 146 lb 154 lb 3.2 oz  Weight (kg) 64.4 kg 66.225 kg 69.945 kg     Body mass index is 24.37 kg/m.  General:  Well nourished, well developed, in no acute distress HEENT: normal Neck: no JVD Vascular: No carotid bruits; Distal pulses 2+ bilaterally Cardiac:  normal S1, S2; RRR; no murmur  Lungs:  clear to auscultation bilaterally, no wheezing, rhonchi or rales  Abd: soft, nontender, no hepatomegaly  Ext: no  edema Musculoskeletal:  No deformities, BUE and BLE  strength normal and equal Skin: warm and dry  Neuro:  CNs 2-12 intact, no focal abnormalities noted Psych:  Normal affect   EKG:  The EKG was personally reviewed and demonstrates: Normal sinus rhythm, no significant ST-T wave changes Telemetry:  Telemetry was personally reviewed and demonstrates: Normal rhythm, no significant ventricular ectopy  Relevant CV Studies:  Echo 02/25/2020  1. Mid and basal inferior wall hypokinesis Normal GLS -15. Left  ventricular ejection fraction, by estimation, is 55%. The left ventricle  has normal function. The left ventricle has no regional wall motion  abnormalities. Left ventricular diastolic  parameters are consistent with Grade I diastolic dysfunction (impaired  relaxation).   2. Right ventricular systolic function is normal. The right ventricular  size is normal. There is normal pulmonary artery systolic pressure.   3. Left atrial size was moderately dilated.   4. The mitral valve is normal in structure. Mild mitral valve  regurgitation. No evidence of mitral stenosis.   5. The aortic valve is tricuspid. Aortic valve regurgitation is not  visualized. Mild aortic valve sclerosis is present, with no evidence of  aortic valve stenosis.   6. The inferior vena cava is normal in size with greater than 50%  respiratory variability, suggesting right atrial pressure of 3 mmHg.   Laboratory Data:  High Sensitivity Troponin:   Recent Labs  Lab 02/22/22 0851 02/22/22 1021  TROPONINIHS 5 5     Chemistry Recent Labs  Lab 02/22/22 0851 02/23/22 0313  NA 132* 125*  K 4.1 3.5  CL 99 89*  CO2 22 20*  GLUCOSE 131* 222*  BUN 7* 11  CREATININE 0.57 0.56  CALCIUM 9.4 9.7  GFRNONAA >60 >60  ANIONGAP 11 16*    Recent Labs  Lab 02/22/22 0851 02/23/22 0313  PROT 6.8 7.2  ALBUMIN 3.9 4.2  AST 19 22  ALT 12 14  ALKPHOS 52 59  BILITOT 0.9 1.1   Lipids No results for input(s): "CHOL", "TRIG", "HDL", "LABVLDL", "LDLCALC", "CHOLHDL" in the last  168 hours.  Hematology Recent Labs  Lab 02/22/22 0851 02/23/22 0313  WBC 5.0 8.2  RBC 4.00 4.31  HGB 13.3 14.2  HCT 37.1 38.8  MCV 92.8 90.0  MCH 33.3 32.9  MCHC 35.8 36.6*  RDW 12.5 12.3  PLT 200 243   Thyroid  Recent Labs  Lab 02/22/22 2239  TSH 2.779    BNP Recent Labs  Lab 02/22/22 0851  BNP 225.3*    DDimer  Recent Labs  Lab 02/22/22 1719  DDIMER 0.52*     Radiology/Studies:  MR BRAIN WO CONTRAST  Result Date: 02/22/2022 CLINICAL DATA:  Initial evaluation for neuro deficit, stroke suspected. EXAM: MRI HEAD WITHOUT CONTRAST MRA HEAD WITHOUT CONTRAST TECHNIQUE: Multiplanar, multi-echo pulse sequences of the brain and surrounding structures were acquired without intravenous contrast. Angiographic images of the Circle of Willis were acquired using MRA technique without intravenous contrast. COMPARISON:  CT from earlier the same day. FINDINGS: MRI HEAD FINDINGS Brain: Examination mildly degraded by motion artifact. Cerebral volume within normal limits. Patchy T2/FLAIR hyperintensity involving the periventricular deep white matter both cerebral hemispheres, most consistent with chronic small vessel ischemic disease, mild to moderate in nature. Mild patchy involvement of the pons noted. No evidence for acute or subacute ischemia. Gray-white matter differentiation maintained. No areas of chronic cortical infarction. No acute or chronic intracranial blood products. No mass lesion, midline shift or mass effect no hydrocephalus or extra-axial  fluid collection. Pituitary gland and suprasellar region normal. Vascular: Major intracranial vascular flow voids are maintained. Skull and upper cervical spine: Craniocervical junction normal. Bone marrow signal intensity within normal limits. No scalp soft tissue abnormality. Sinuses/Orbits: Prior bilateral ocular lens replacement. Mild scattered mucosal thickening noted about the ethmoidal air cells. Paranasal sinuses are otherwise clear. No  significant mastoid effusion. Other: None. MRA HEAD FINDINGS Anterior circulation: Examination mildly degraded by motion artifact. Both internal carotid arteries patent to the termini without stenosis or other abnormality. A1 segments patent. Normal anterior communicating artery complex. Anterior cerebral arteries patent without visible stenosis. No M1 stenosis or occlusion. No proximal MCA branch occlusion. Distal MCA branches perfused and symmetric. Posterior circulation: Both vertebral arteries patent to the vertebrobasilar junction without stenosis. Both PICA are patent. Basilar patent to its distal aspect without stenosis. Superior cerebellar and posterior cerebral arteries widely patent bilaterally. Anatomic variants: None significant.  No aneurysm. IMPRESSION: MRI HEAD IMPRESSION: 1. No acute intracranial infarct or other abnormality. 2. 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 MRA HEAD IMPRESSION: Negative intracranial MRA. No large vessel occlusion, hemodynamically significant stenosis, or other acute vascular abnormality. Electronically Signed   By: Rise Mu M.D.   On: 02/22/2022 21:08   MR ANGIO HEAD WO CONTRAST  Result Date: 02/22/2022 CLINICAL DATA:  Initial evaluation for neuro deficit, stroke suspected. EXAM: MRI HEAD WITHOUT CONTRAST MRA HEAD WITHOUT CONTRAST TECHNIQUE: Multiplanar, multi-echo pulse sequences of the brain and surrounding structures were acquired without intravenous contrast. Angiographic images of the Circle of Willis were acquired using MRA technique without intravenous contrast. COMPARISON:  CT from earlier the same day. FINDINGS: MRI HEAD FINDINGS Brain: Examination mildly degraded by motion artifact. Cerebral volume within normal limits. Patchy T2/FLAIR hyperintensity involving the periventricular deep white matter both cerebral hemispheres, most consistent with chronic small vessel ischemic disease, mild to moderate in nature. Mild patchy  involvement of the pons noted. No evidence for acute or subacute ischemia. Gray-white matter differentiation maintained. No areas of chronic cortical infarction. No acute or chronic intracranial blood products. No mass lesion, midline shift or mass effect no hydrocephalus or extra-axial fluid collection. Pituitary gland and suprasellar region normal. Vascular: Major intracranial vascular flow voids are maintained. Skull and upper cervical spine: Craniocervical junction normal. Bone marrow signal intensity within normal limits. No scalp soft tissue abnormality. Sinuses/Orbits: Prior bilateral ocular lens replacement. Mild scattered mucosal thickening noted about the ethmoidal air cells. Paranasal sinuses are otherwise clear. No significant mastoid effusion. Other: None. MRA HEAD FINDINGS Anterior circulation: Examination mildly degraded by motion artifact. Both internal carotid arteries patent to the termini without stenosis or other abnormality. A1 segments patent. Normal anterior communicating artery complex. Anterior cerebral arteries patent without visible stenosis. No M1 stenosis or occlusion. No proximal MCA branch occlusion. Distal MCA branches perfused and symmetric. Posterior circulation: Both vertebral arteries patent to the vertebrobasilar junction without stenosis. Both PICA are patent. Basilar patent to its distal aspect without stenosis. Superior cerebellar and posterior cerebral arteries widely patent bilaterally. Anatomic variants: None significant.  No aneurysm. IMPRESSION: MRI HEAD IMPRESSION: 1. No acute intracranial infarct or other abnormality. 2. 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 MRA HEAD IMPRESSION: Negative intracranial MRA. No large vessel occlusion, hemodynamically significant stenosis, or other acute vascular abnormality. Electronically Signed   By: Rise Mu M.D.   On: 02/22/2022 21:08   CT Head Wo Contrast  Result Date: 02/22/2022 CLINICAL DATA:   Headache, new or worrisome. EXAM: CT HEAD WITHOUT CONTRAST TECHNIQUE: Contiguous axial images were obtained  from the base of the skull through the vertex without intravenous contrast. RADIATION DOSE REDUCTION: This exam was performed according to the departmental dose-optimization program which includes automated exposure control, adjustment of the mA and/or kV according to patient size and/or use of iterative reconstruction technique. COMPARISON:  MRI examination dated October 07, 2021 FINDINGS: Brain: No evidence of acute infarction, hemorrhage, hydrocephalus, extra-axial collection or mass lesion/mass effect. Prominence of the ventricles and sulci secondary to moderate cerebral volume loss. Low-attenuation of the periventricular and subcortical white matter presumed chronic microvascular ischemic changes, unchanged. Vascular: No hyperdense vessel or unexpected calcification. Skull: Normal. Negative for fracture or focal lesion. Sinuses/Orbits: No acute finding.  Bilateral cataract surgery. Other: None. IMPRESSION: 1.  No acute intracranial abnormality. 2. 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 Electronically Signed   By: Larose HiresImran  Ahmed D.O.   On: 02/22/2022 10:18   DG Chest Portable 1 View  Result Date: 02/22/2022 CLINICAL DATA:  Shortness of breath.  Chest tightness. EXAM: PORTABLE CHEST 1 VIEW COMPARISON:  Two-view chest x-ray 02/16/2020 FINDINGS: The heart size and mediastinal contours are within normal limits. Both lungs are clear. The visualized skeletal structures are unremarkable. IMPRESSION: No active disease. Electronically Signed   By: Marin Robertshristopher  Mattern M.D.   On: 02/22/2022 09:14     Assessment and Plan:   Chest pain and worsening shortness of breath   -Patient has known nonobstructive disease based on previous coronary CT in 2019.  Recent chest pain has mixed typical and atypical features.  Progressively worsening chest  pain and shortness of breath is concerning, however yesterday, patient had chest pain for hours with 2 negative troponin.   -With her change in the mental status and trouble with word finding, I am hesitant to recommend invasive study.  Will discuss with MD regarding repeat coronary CT.  She did have a PET stress test scheduled with near the end of August.  Trouble with word finding: CT of the head was negative.  MRA and MRI were also negative for acute changes, showed chronic microvascular changes and atrophy.  However despite blood pressure control, she continued to have significant difficulty with word finding.  May need to consider neurology evaluation.  Hypertensive urgency: Arrived with systolic blood pressure of 207/109.  She has been compliant with valsartan-HCTZ, however she says her 20 mg daily of nebivolol was taken off 3 months ago.  I was unable to find any documentation why her nebivolol was stopped.  Looking at her home blood pressure, she checked her blood pressure twice a day, recent blood pressure has been in the 110s to 150s.  Not sure why her blood pressure spiked.  Since restarted on the nebivolol, blood pressure has came down to 140s range.  Home valsartan switched to irbesartan.  Hydrochlorothiazide was taken off due to worsening hyponatremia.  She was given some IV fluid yesterday.  Plan to repeat.  Hyponatremia: Sodium level 132 yesterday, however this morning it came down to 125  Chronic diastolic heart failure: Euvolemic on exam.  Hyperlipidemia: On simvastatin.  DM 2: On insulin   Risk Assessment/Risk Scores:     HEAR Score (for undifferentiated chest pain):             For questions or updates, please contact [facility] Please consult [email or web address] for contact info under    Ramond DialSigned, Leiana Rund, [place]  02/23/2022 8:54 AM

## 2022-02-23 NOTE — Progress Notes (Signed)
Initial Nutrition Assessment  DOCUMENTATION CODES:   Not applicable  INTERVENTION:  - continue Ensure Plus High Protein BID, each supplement provides 350 kcal and 20 grams of protein.  - will order 1 tablet multivitamin with minerals/day.  - will enter Low Sodium Nutrition Therapy handout in AVS.    NUTRITION DIAGNOSIS:   Increased nutrient needs related to acute illness as evidenced by estimated needs.  GOAL:   Patient will meet greater than or equal to 90% of their needs  MONITOR:   PO intake, Supplement acceptance, Labs, Weight trends  REASON FOR ASSESSMENT:   Malnutrition Screening Tool  ASSESSMENT:   76 y.o. female with medical history of HTN, HLD, type 2 DM, hypothyroidism, CHF, prior cardiac CT 2019, anxiety, asthma, GERD, and ulcerative colitis. Patient presented to the ED due to shortness of breath and chest pain x1 month. The day of presentation she was experiencing confusion and difficulty expressing herself. She was admitted due to malignant HTN.  Unable to see patient x2 attempts. No meal intake percentages documented since admission.   Ensure ordered BID per ONS protocol at the time of admission and patient accepted the one bottle offered to her so far.   Weight yesterday was documented as both 142 lb and 146 lb. PTA the most recently documented weight was 154 lb on 05/03/21. No information documented in the edema section of flow sheet.    Labs reviewed; CBGs: 180, 245, 142 mg/dl, Na: 517 mmol/l, Cl: 83 mmol/l. Medications reviewed; 1000 mcg cyanocobalamin/day, sliding scale novolog, 50 mcg oral synthroid/day.    NUTRITION - FOCUSED PHYSICAL EXAM:  Unable at this time.   Diet Order:   Diet Order             Diet Heart Room service appropriate? Yes; Fluid consistency: Thin  Diet effective now                   EDUCATION NEEDS:   No education needs have been identified at this time  Skin:  Skin Assessment: Reviewed RN Assessment  Last BM:   PTA/unknown  Height:   Ht Readings from Last 1 Encounters:  02/22/22 5\' 4"  (1.626 m)    Weight:   Wt Readings from Last 1 Encounters:  02/22/22 64.4 kg     BMI:  Body mass index is 24.37 kg/m.  Estimated Nutritional Needs:  Kcal:  1600-1800 kcal Protein:  75-85 grams Fluid:  >/= 1.7 L/day      02/24/22, MS, RD, LDN, CNSC Registered Dietitian II Inpatient Clinical Nutrition RD pager # and on-call/weekend pager # available in [facility]

## 2022-02-23 NOTE — Progress Notes (Signed)
Lab called and notified of ABG being sent at this time.  

## 2022-02-23 NOTE — Progress Notes (Addendum)
PROGRESS NOTE    Kristina [name]  HYW:[id number] DOB: [birth year] DOA: 02/22/2022 PCP: Oneita Hurt, No   Brief Narrative: Kristina [name] is a 76 y.o. female past medical history significant for labile hypertension, hyperlipidemia, diabetes type 2 , hypothyroidism, chronic diastolic heart failure, prior cardiac CT 2019 Calcium  score 122, 68 percentile Presents complaining of shortness of breath and chest pain at rest when she wake up this morning.  She reports chest pain and shortness of breath for the last month especially on exertion.  She relates that the chest pain and tightness has been progressively getting worse over the last  weeks.  This morning she developed worsening chest pain and dyspnea at rest.   She also noticed difficulty finding words since this morning, she is also feeling confused.  Her son noticed that she is foggy and having difficulty expressing herself.  She had some difficulty trying to figure out how to make a phone call from her phone, which she normally is be able to do.  Patient admitted with malignant hypertension.   Assessment & Plan:   Principal Problem:   HTN (hypertension), malignant Active Problems:   Diabetes mellitus without complication (HCC)   HTN (hypertension)   Hyponatremia   Thyroid disease   Hypercholesteremia   GERD (gastroesophageal reflux disease)   Chest pain  1-Malignant HTN;  Patient presents with a systolic blood pressure 207/105, shortness of breath, chest pain, confusion, difficulty finding words.  Continue.   -Continue with IV hydralazine and IV labetalol ordered. -Discontinue HCTZ due to hyponatremia.  -Continue with Nebivolol and Avapro.  -SBP 140 this am. If increases could add oral hydralazine.     2-Chest pain, shortness of breath: Could be related to high blood pressure. Troponin x2 negative Also Concern with angina, cardiology has been consulted.  PRN nitroglycerine Continue Zocor. Started baby aspirin.  Chest x ray  negative. Mildly elevated BNP. Chest pain and dyspnea improved.  Follow cardiology recommendation.    3-Headaches, Acute metabolic encephalopathy, Difficulty finding words.  Suspect related to HTN/.  CT head: negative MRI/MRA brain. Negative Tramadol PRN ordered.  Check ABG.  Sodium yesterday was not that significantly low to explain difficulty finding words.  TSH; 2.7  4-Hypothyroidism; Continue with Synthroid.  5-DM type; SSI.  Hold home metformin.  6-Chronic diastolic HF; does not appears to be volume overload.  Chest x ray negative. Mildly elevated BNP.  7-Hyponatremia; Worse this morning. Suspect dehydration secondary to vomiting. Started IV fluids. Hold HCTZ. Check Bmet later today,        Estimated body mass index is 24.37 kg/m as calculated from the following:   Height as of this encounter: 5\' 4"  (1.626 m).   Weight as of this encounter: 64.4 kg.   DVT prophylaxis: Lovenox Code Status: Full code Family Communication:Son in the unit Disposition Plan:  Status is: Observation The patient will require care spanning > 2 midnights and should be moved to inpatient because: need evaluation of chest pain, BP management    Consultants:  Cardiology Neurology   Procedures:  ECHO  Antimicrobials:    Subjective: She is feeling better today. No headaches. Still having difficulty finding words.  Chest pain dyspnea better.    Objective: Vitals:   02/23/22 0130 02/23/22 0230 02/23/22 0400 02/23/22 0600  BP: 131/82 (!) 145/75 120/65 140/83  Pulse: 95 89 81 85  Resp: 14 (!) 21 16 20   Temp:   98.5 F (36.9 C)   TempSrc:   Oral   SpO2:  99% 98% 99% 99%  Weight:      Height:        Intake/Output Summary (Last 24 hours) at 02/23/2022 0724 Last data filed at 02/23/2022 0606 Gross per 24 hour  Intake 500 ml  Output 300 ml  Net 200 ml   Filed Weights   02/22/22 0843 02/22/22 2108  Weight: 66.2 kg 64.4 kg    Examination:  General exam: Appears calm and  comfortable  Respiratory system: Clear to auscultation. Respiratory effort normal. Cardiovascular system: S1 & S2 heard, RRR. No JVD, murmurs, rubs, gallops or clicks. No pedal edema. Gastrointestinal system: Abdomen is nondistended, soft and nontender. No organomegaly or masses felt. Normal bowel sounds heard. Central nervous system: Alert and oriented. No focal neurological deficits. Extremities: Symmetric 5 x 5 power.    Data Reviewed: I have personally reviewed following labs and imaging studies  CBC: Recent Labs  Lab 02/22/22 0851 02/23/22 0313  WBC 5.0 8.2  NEUTROABS 4.0  --   HGB 13.3 14.2  HCT 37.1 38.8  MCV 92.8 90.0  PLT 200 243   Basic Metabolic Panel: Recent Labs  Lab 02/22/22 0851 02/23/22 0313  NA 132* 125*  K 4.1 3.5  CL 99 89*  CO2 22 20*  GLUCOSE 131* 222*  BUN 7* 11  CREATININE 0.57 0.56  CALCIUM 9.4 9.7   GFR: Estimated Creatinine Clearance: 50.9 mL/min (by C-G formula based on SCr of 0.56 mg/dL). Liver Function Tests: Recent Labs  Lab 02/22/22 0851 02/23/22 0313  AST 19 22  ALT 12 14  ALKPHOS 52 59  BILITOT 0.9 1.1  PROT 6.8 7.2  ALBUMIN 3.9 4.2   No results for input(s): "LIPASE", "AMYLASE" in the last 168 hours. No results for input(s): "AMMONIA" in the last 168 hours. Coagulation Profile: No results for input(s): "INR", "PROTIME" in the last 168 hours. Cardiac Enzymes: No results for input(s): "CKTOTAL", "CKMB", "CKMBINDEX", "TROPONINI" in the last 168 hours. BNP (last 3 results) No results for input(s): "PROBNP" in the last 8760 hours. HbA1C: No results for input(s): "HGBA1C" in the last 72 hours. CBG: Recent Labs  Lab 02/22/22 1933 02/22/22 2216  GLUCAP 215* 211*   Lipid Profile: No results for input(s): "CHOL", "HDL", "LDLCALC", "TRIG", "CHOLHDL", "LDLDIRECT" in the last 72 hours. Thyroid Function Tests: Recent Labs    02/22/22 2239  TSH 2.779   Anemia Panel: No results for input(s): "VITAMINB12", "FOLATE",  "FERRITIN", "TIBC", "IRON", "RETICCTPCT" in the last 72 hours. Sepsis Labs: No results for input(s): "PROCALCITON", "LATICACIDVEN" in the last 168 hours.  Recent Results (from the past 240 hour(s))  MRSA Next Gen by PCR, Nasal     Status: None   Collection Time: 02/22/22  7:39 PM   Specimen: Nasal Mucosa; Nasal Swab  Result Value Ref Range Status   MRSA by PCR Next Gen NOT DETECTED NOT DETECTED Final    Comment: (NOTE) The GeneXpert MRSA Assay (FDA approved for NASAL specimens only), is one component of a comprehensive MRSA colonization surveillance program. It is not intended to diagnose MRSA infection nor to guide or monitor treatment for MRSA infections. Test performance is not FDA approved in patients less than 50 years old. Performed at [facility], [address] [place], [place] [address]          Radiology Studies: MR BRAIN WO CONTRAST  Result Date: 02/22/2022 CLINICAL DATA:  Initial evaluation for neuro deficit, stroke suspected. EXAM: MRI HEAD WITHOUT CONTRAST MRA HEAD WITHOUT CONTRAST TECHNIQUE: Multiplanar, multi-echo  pulse sequences of the brain and surrounding structures were acquired without intravenous contrast. Angiographic images of the Circle of Willis were acquired using MRA technique without intravenous contrast. COMPARISON:  CT from earlier the same day. FINDINGS: MRI HEAD FINDINGS Brain: Examination mildly degraded by motion artifact. Cerebral volume within normal limits. Patchy T2/FLAIR hyperintensity involving the periventricular deep white matter both cerebral hemispheres, most consistent with chronic small vessel ischemic disease, mild to moderate in nature. Mild patchy involvement of the pons noted. No evidence for acute or subacute ischemia. Gray-white matter differentiation maintained. No areas of chronic cortical infarction. No acute or chronic intracranial blood products. No mass lesion, midline shift or mass effect no hydrocephalus or  extra-axial fluid collection. Pituitary gland and suprasellar region normal. Vascular: Major intracranial vascular flow voids are maintained. Skull and upper cervical spine: Craniocervical junction normal. Bone marrow signal intensity within normal limits. No scalp soft tissue abnormality. Sinuses/Orbits: Prior bilateral ocular lens replacement. Mild scattered mucosal thickening noted about the ethmoidal air cells. Paranasal sinuses are otherwise clear. No significant mastoid effusion. Other: None. MRA HEAD FINDINGS Anterior circulation: Examination mildly degraded by motion artifact. Both internal carotid arteries patent to the termini without stenosis or other abnormality. A1 segments patent. Normal anterior communicating artery complex. Anterior cerebral arteries patent without visible stenosis. No M1 stenosis or occlusion. No proximal MCA branch occlusion. Distal MCA branches perfused and symmetric. Posterior circulation: Both vertebral arteries patent to the vertebrobasilar junction without stenosis. Both PICA are patent. Basilar patent to its distal aspect without stenosis. Superior cerebellar and posterior cerebral arteries widely patent bilaterally. Anatomic variants: None significant.  No aneurysm. IMPRESSION: MRI HEAD IMPRESSION: 1. No acute intracranial infarct or other abnormality. 2. 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 MRA HEAD IMPRESSION: Negative intracranial MRA. No large vessel occlusion, hemodynamically significant stenosis, or other acute vascular abnormality. Electronically Signed   By: Rise Mu M.D.   On: 02/22/2022 21:08   MR ANGIO HEAD WO CONTRAST  Result Date: 02/22/2022 CLINICAL DATA:  Initial evaluation for neuro deficit, stroke suspected. EXAM: MRI HEAD WITHOUT CONTRAST MRA HEAD WITHOUT CONTRAST TECHNIQUE: Multiplanar, multi-echo pulse sequences of the brain and surrounding structures were acquired without intravenous contrast. Angiographic images of  the Circle of Willis were acquired using MRA technique without intravenous contrast. COMPARISON:  CT from earlier the same day. FINDINGS: MRI HEAD FINDINGS Brain: Examination mildly degraded by motion artifact. Cerebral volume within normal limits. Patchy T2/FLAIR hyperintensity involving the periventricular deep white matter both cerebral hemispheres, most consistent with chronic small vessel ischemic disease, mild to moderate in nature. Mild patchy involvement of the pons noted. No evidence for acute or subacute ischemia. Gray-white matter differentiation maintained. No areas of chronic cortical infarction. No acute or chronic intracranial blood products. No mass lesion, midline shift or mass effect no hydrocephalus or extra-axial fluid collection. Pituitary gland and suprasellar region normal. Vascular: Major intracranial vascular flow voids are maintained. Skull and upper cervical spine: Craniocervical junction normal. Bone marrow signal intensity within normal limits. No scalp soft tissue abnormality. Sinuses/Orbits: Prior bilateral ocular lens replacement. Mild scattered mucosal thickening noted about the ethmoidal air cells. Paranasal sinuses are otherwise clear. No significant mastoid effusion. Other: None. MRA HEAD FINDINGS Anterior circulation: Examination mildly degraded by motion artifact. Both internal carotid arteries patent to the termini without stenosis or other abnormality. A1 segments patent. Normal anterior communicating artery complex. Anterior cerebral arteries patent without visible stenosis. No M1 stenosis or occlusion. No proximal MCA branch occlusion. Distal MCA branches perfused and  symmetric. Posterior circulation: Both vertebral arteries patent to the vertebrobasilar junction without stenosis. Both PICA are patent. Basilar patent to its distal aspect without stenosis. Superior cerebellar and posterior cerebral arteries widely patent bilaterally. Anatomic variants: None significant.  No  aneurysm. IMPRESSION: MRI HEAD IMPRESSION: 1. No acute intracranial infarct or other abnormality. 2. 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 MRA HEAD IMPRESSION: Negative intracranial MRA. No large vessel occlusion, hemodynamically significant stenosis, or other acute vascular abnormality. Electronically Signed   By: Rise Mu M.D.   On: 02/22/2022 21:08   CT Head Wo Contrast  Result Date: 02/22/2022 CLINICAL DATA:  Headache, new or worrisome. EXAM: CT HEAD WITHOUT CONTRAST TECHNIQUE: Contiguous axial images were obtained from the base of the skull through the vertex without intravenous contrast. RADIATION DOSE REDUCTION: This exam was performed according to the departmental dose-optimization program which includes automated exposure control, adjustment of the mA and/or kV according to patient size and/or use of iterative reconstruction technique. COMPARISON:  MRI examination dated October 07, 2021 FINDINGS: Brain: No evidence of acute infarction, hemorrhage, hydrocephalus, extra-axial collection or mass lesion/mass effect. Prominence of the ventricles and sulci secondary to moderate cerebral volume loss. Low-attenuation of the periventricular and subcortical white matter presumed chronic microvascular ischemic changes, unchanged. Vascular: No hyperdense vessel or unexpected calcification. Skull: Normal. Negative for fracture or focal lesion. Sinuses/Orbits: No acute finding.  Bilateral cataract surgery. Other: None. IMPRESSION: 1.  No acute intracranial abnormality. 2. 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 Electronically Signed   By: Larose Hires D.O.   On: 02/22/2022 10:18   DG Chest Portable 1 View  Result Date: 02/22/2022 CLINICAL DATA:  Shortness of breath.  Chest tightness. EXAM: PORTABLE CHEST 1 VIEW COMPARISON:  Two-view chest x-ray 02/16/2020 FINDINGS: The heart size and mediastinal contours are  within normal limits. Both lungs are clear. The visualized skeletal structures are unremarkable. IMPRESSION: No active disease. Electronically Signed   By: Marin Roberts M.D.   On: 02/22/2022 09:14        Scheduled Meds:  aspirin EC  81 mg Oral Daily   budesonide  3 mg Oral Daily   Chlorhexidine Gluconate Cloth  6 each Topical Daily   cyanocobalamin  1,000 mcg Oral Daily   enoxaparin (LOVENOX) injection  40 mg Subcutaneous Q24H   feeding supplement  237 mL Oral BID BM   insulin aspart  0-9 Units Subcutaneous TID WC   irbesartan  150 mg Oral Daily   levothyroxine  50 mcg Oral q morning   nebivolol  20 mg Oral Daily   simvastatin  20 mg Oral QPM   sodium chloride flush  3 mL Intravenous Q12H   Continuous Infusions:  sodium chloride     sodium chloride 50 mL/hr at 02/23/22 0606   sodium chloride       LOS: 0 days    Time spent: 35 minutes    Dereka Lueras A Maddison Kilner, MD [facility]   If 7PM-7AM, please contact night-coverage [email or web address]  02/23/2022, 7:24 AM

## 2022-02-23 NOTE — Consult Note (Signed)
Neurology Consultation Reason for Consult: Confusion Referring Physician: Madlyn Frankel  CC: Confusion  History is obtained from: Patient  HPI: Kristina [name] is a 76 y.o. female who presents with difficulty getting out her sentences that has been going on for a few days.  She has had nausea and vomiting, and feeling foggy headed.  She also had a headache, though this is improved now.  She presented to the emergency department yesterday where she was found to be severely hypertensive with systolics in the 200s.  She was admitted, and she states that she feels better today than yesterday.   MRI was negative.    Past Medical History:  Diagnosis Date   Anxiety    Asthma    Diabetes (HCC)    Diabetes mellitus without complication (HCC)    Diverticulitis    GERD (gastroesophageal reflux disease)    HTN (hypertension)    Hypercholesteremia    Hyperlipidemia    Hypertension    Obesity    Rosacea    Thyroid disease    Ulcerative colitis (HCC)      Family History  Problem Relation Age of Onset   Hypertension Mother    Heart failure Mother    Heart attack Mother    Hypotension Father    Dementia Father      Social History:  reports that she has never smoked. She has never used smokeless tobacco. She reports that she does not drink alcohol and does not use drugs.   Exam: Current vital signs: BP (!) 190/95 Comment: hyrdalazine given  Pulse 79   Temp 98.5 F (36.9 C) (Oral)   Resp 16   Ht 5\' 4"  (1.626 m)   Wt 64.4 kg   SpO2 98%   BMI 24.37 kg/m  Vital signs in last 24 hours: Temp:  [97.9 F (36.6 C)-98.6 F (37 C)] 98.5 F (36.9 C) (07/28 1929) Pulse Rate:  [79-103] 79 (07/28 1000) Resp:  [13-21] 16 (07/28 1857) BP: (120-190)/(65-99) 190/95 (07/28 1857) SpO2:  [98 %-100 %] 98 % (07/28 1929) Weight:  [64.4 kg] 64.4 kg (07/27 2108)   Physical Exam  Constitutional: Appears well-developed and well-nourished.  Psych: Affect appropriate to situation Eyes:  No scleral injection HENT: No OP obstruction MSK: no joint deformities.  Cardiovascular: Normal rate and regular rhythm.  Respiratory: Effort normal, non-labored breathing GI: Soft.  No distension. There is no tenderness.  Skin: WDI  Neuro: Mental Status: Patient is awake, alert, oriented to person, place, month, year, and situation. She is unable to spell world backwards, she is unable to give the number of quarters in $2.75. She is able to repeat simple phrases, though more complex longer phrases she gets confused while repeating them.  She is able to name objects, and her speech is fluent, though she frequently loses her train of thought.  Cranial Nerves: II: Visual Fields are full. Pupils are equal, round, and reactive to light.   III,IV, VI: EOMI without ptosis or diploplia.  V: Facial sensation is symmetric to temperature VII: Facial movement with slight downturn of her right face, though on reviewing old images, this is chronic VIII: hearing is intact to voice X: Uvula elevates symmetrically XI: Shoulder shrug is symmetric. XII: tongue is midline without atrophy or fasciculations.  Motor: Tone is normal. Bulk is normal. 5/5 strength was present in all four extremities.  Sensory: Sensation is symmetric to light touch and temperature in the arms and legs. Cerebellar: FNF and HKS are intact bilaterally  I have reviewed labs in epic and the results pertinent to this consultation are: Sodium 117 B12 3000 TSH 2.79   I have reviewed the images obtained: MRI brain is negative  Impression: 76 year old female with attentional deficits in the setting of acute hyponatremia, hypertensive urgency, chest pain and shortness of breath.  She does not appear to have any true evidence of aphasia, but rather significant deficits of attention.  She frequently loses her train of thought in the middle of a sentence, but is not able to reorient herself to the beginning of the sentence.  This is  much more consistent with attentional deficits than any type of aphasia.  I suspect that she may have had some degree of hypertensive encephalopathy, coupled with an acute hyponatremia.  She reports feeling slightly better, and I suspect that she will continue to have gradual improvement as her underlying medical issues improved.  I do not think there would be significant yield in further neurological evaluation, but we correct her medical issues and if her mental status does not improve as would be expected, the neurology can be available to reassess at that time.  Recommendations: 1) would continue supportive medical care 2) neurology will be available on an as-needed basis.   Ritta Slot, MD [facility] [phone]  If 7pm- 7am, please page neurology on call as listed in AMION.

## 2022-02-23 NOTE — Consult Note (Signed)
Renal Service Consult Note [facility]  Kristina [name] 02/23/2022 [name], MD Requesting Physician: Dr. [name]  Reason for Consult: Hyponatremia HPI: The patient is a 76 y.o. year-old w/ hx of DM2, HTN, HL, ulcerative colitis, asthma who presented to ED 7/27 for chest pain and DOE worsening over the last month. Also c/o speech difficulty and feeling confused, foggy headed and not able to express herself. Also had a severe HA. In ED Na was 132 K 41.  Creat 0.5, glu 131, trop 5x 2, BNP 225, wBC 5 Hb 13. CT head negative. CXR no acute disease. EKG sinus tach. BP was high at 207/105. Pt was admitted for malignant HTN and was treated w/ her home meds (valsartan, hctz and bystolic), and also rec'd IV hydralazine/ labetalol as needed. BP's yest afternoon were still here but overnight seemed to improve to the 150- 180/ 80-100 range. Last night then patient developed nausea and vomiting and this am the serum Na was down to 125. She was started on IVF's w/ NS 0.9%.  Repeat Na+ level at 11 am today was down to 117 (repeat 118). We were asked to see for hyponatremia.   Pt seen in her room. She is pleasant and responsive. States she "feels better" than yesterday. She asks about her speech problems. She is  Ox2 to year and person, not place. No focal weakness. No hx low Na issues. No renal issues.   ROS - denies CP, no joint pain, no HA, no blurry vision, no rash, no dysuria, no difficulty voiding   Past Medical History  Past Medical History:  Diagnosis Date   Anxiety    Asthma    Diabetes (HCC)    Diabetes mellitus without complication (HCC)    Diverticulitis    GERD (gastroesophageal reflux disease)    HTN (hypertension)    Hypercholesteremia    Hyperlipidemia    Hypertension    Obesity    Rosacea    Thyroid disease    Ulcerative colitis (HCC)    Past Surgical History  Past Surgical History:  Procedure Laterality Date   ACHILLES TENDON REPAIR     APPENDECTOMY      BIOPSY  10/16/2018   Procedure: BIOPSY;  Surgeon: Kathi Der, MD;  Location: [facility];  Service: Gastroenterology;;   CESAREAN SECTION     CHOLECYSTECTOMY     FLEXIBLE SIGMOIDOSCOPY N/A 10/16/2018   Procedure: FLEXIBLE SIGMOIDOSCOPY;  Surgeon: Kathi Der, MD;  Location: [facility] ENDOSCOPY;  Service: Gastroenterology;  Laterality: N/A;   hammertoe     Family History  Family History  Problem Relation Age of Onset   Hypertension Mother    Heart failure Mother    Heart attack Mother    Hypotension Father    Dementia Father    Social History  reports that she has never smoked. She has never used smokeless tobacco. She reports that she does not drink alcohol and does not use drugs. Allergies  Allergies  Allergen Reactions   Ace Inhibitors Cough   Ciprofloxacin Other (See Comments)    Tendinitis in heel and posterior right LE Tendon rupture   Doxycycline Other (See Comments)    Pt does not remember what the reaction was - many years ago   Shrimp [Shellfish Allergy] Diarrhea, Nausea And Vomiting and Rash   Home medications Prior to Admission medications   Medication Sig Start Date End Date Taking? Authorizing Provider  acetaminophen (TYLENOL) 500 MG tablet Take 1,000 mg by mouth 2 (two)  times daily as needed (pain).   Yes [provider]  budesonide (ENTOCORT EC) 3 MG 24 hr capsule Take 3 mg by mouth daily.   Yes [provider]  CALCIUM PO Take 500 mg by mouth at bedtime.   Yes [provider]  cyanocobalamin (VITAMIN B12) 1000 MCG tablet Take 1,000 mcg by mouth daily.   Yes [provider]  desonide (DESOWEN) 0.05 % cream Apply 1 Application topically daily as needed for rash. 11/15/21  Yes [provider]  hyoscyamine (LEVSIN) 0.125 MG tablet Take 0.125 mg by mouth 2 (two) times daily as needed for cramping. 01/04/20  Yes [provider]  levothyroxine (SYNTHROID) 50 MCG tablet Take 50 mcg by mouth every morning. 01/20/22   Yes [provider]  mesalamine (APRISO) 0.375 g 24 hr capsule Take 4 capsules by mouth daily. 08/28/18  Yes [provider]  metFORMIN (GLUCOPHAGE-XR) 500 MG 24 hr tablet Take 3 tablets (1,500 mg total) by mouth daily. 04/05/18  Yes Randel Pigg, [name], MD  Probiotic Product (ALIGN PO) Take 1 tablet by mouth daily.   Yes [provider]  simethicone (MYLICON) 125 MG chewable tablet Chew 125 mg by mouth every 6 (six) hours as needed for flatulence.   Yes [provider]  simvastatin (ZOCOR) 20 MG tablet Take 20 mg by mouth every evening.  08/05/13  Yes [provider]  tizanidine (ZANAFLEX) 2 MG capsule Take 1-2 capsules (2-4 mg total) by mouth 3 (three) times daily. Patient taking differently: Take 2-4 mg by mouth 3 (three) times daily as needed for muscle spasms. 10/03/20  Yes [name], FNP  valsartan-hydrochlorothiazide (DIOVAN-HCT) 160-12.5 MG tablet Take 1 tablet by mouth daily. 01/29/22  Yes [provider]  furosemide (LASIX) 20 MG tablet TAKE 1 TABLET BY MOUTH EVERY DAY Patient not taking: Reported on 02/22/2022 01/03/22   [name], MD  Nebivolol HCl 20 MG TABS Take 20 mg by mouth daily. Patient not taking: Reported on 02/22/2022 09/27/21   [provider]     Vitals:   02/23/22 0600 02/23/22 0800 02/23/22 1000 02/23/22 1200  BP: 140/83  (!) 150/91   Pulse: 85  79   Resp: 20  20   Temp:  98.5 F (36.9 C)  98.4 F (36.9 C)  TempSrc:  Oral  Oral  SpO2: 99%  98%   Weight:      Height:       Exam Gen alert, no distress No rash, cyanosis or gangrene Sclera anicteric, throat clear  No jvd or bruits Chest clear bilat to bases, no rales/ wheezing RRR no MRG Abd soft ntnd no mass or ascites +bs GU normal MS no joint effusions or deformity Ext no LE or UE edema, no wounds or ulcers Neuro is alert, Ox 3 , nf     Home meds include - budesnoide, cyanocobalamin, hyoscyamine, levothyroxine, metformin, simvastatin,  valsartan- hctz qd, furosemide 20 qd, nebivolol 20 qd, prns/ vits/ supps    I/O since admit > 800 cc in and 300 cc UOP = +500 net   BP 160 /95, HR 78  RR 18    Na+ 118  K 3.5 BUN 15  Cr 0.67    UNa, UOsm, UA pending    UCx pending   Assessment/ Plan: Hyponatremia - developed after admission, preceded by N/V overnight last night. Urine labs haven't been sent yet. Pt is euvolemic on exam. Will get TSH and am cortisol. No signs of CHF,  renal / liver failure. Urine lytes not collected, will order foley catheter placement. With rapid decline in Na+ level and associated AMS would go ahead w/ 3% saline. Orders are in. Will follow.  Confusion/ speech altered - Ox 2 this afternoon. Some of this could be due to low Na+ but her confusion dates back x weeks prior to drop in Na+ levels. Will get UA and urine cx along w/ her UOsm and UNa.  Uncont HTN - improving on po irbesartan and bystolic.  HCTZ dc'd appropriately. Will add po norvasc 5 bid for now.        Rob Emer Onnen  MD 02/23/2022, 3:36 PM Recent Labs  Lab 02/22/22 0851 02/23/22 0313 02/23/22 1139 02/23/22 1337  HGB 13.3 14.2  --   --   ALBUMIN 3.9 4.2  --   --   CALCIUM 9.4 9.7 9.2 9.5  CREATININE 0.57 0.56 0.56 0.67  K 4.1 3.5 3.4* 3.5

## 2022-02-24 DIAGNOSIS — I10 Essential (primary) hypertension: Secondary | ICD-10-CM | POA: Diagnosis not present

## 2022-02-24 LAB — URINALYSIS, ROUTINE W REFLEX MICROSCOPIC
Bilirubin Urine: NEGATIVE
Glucose, UA: NEGATIVE mg/dL
Ketones, ur: 20 mg/dL — AB
Nitrite: NEGATIVE
Protein, ur: 30 mg/dL — AB
Specific Gravity, Urine: 1.02 (ref 1.005–1.030)
pH: 5 (ref 5.0–8.0)

## 2022-02-24 LAB — GLUCOSE, CAPILLARY
Glucose-Capillary: 157 mg/dL — ABNORMAL HIGH (ref 70–99)
Glucose-Capillary: 221 mg/dL — ABNORMAL HIGH (ref 70–99)
Glucose-Capillary: 305 mg/dL — ABNORMAL HIGH (ref 70–99)
Glucose-Capillary: 102 mg/dL — ABNORMAL HIGH (ref 70–99)

## 2022-02-24 LAB — BASIC METABOLIC PANEL
Anion gap: 12 (ref 5–15)
BUN: 14 mg/dL (ref 8–23)
CO2: 23 mmol/L (ref 22–32)
Calcium: 9 mg/dL (ref 8.9–10.3)
Chloride: 90 mmol/L — ABNORMAL LOW (ref 98–111)
Creatinine, Ser: 0.6 mg/dL (ref 0.44–1.00)
GFR, Estimated: >60 mL/min (ref >60)
Glucose, Bld: 160 mg/dL — ABNORMAL HIGH (ref 70–99)
Potassium: 3.4 mmol/L — ABNORMAL LOW (ref 3.5–5.1)
Sodium: 125 mmol/L — ABNORMAL LOW (ref 135–145)

## 2022-02-24 LAB — SODIUM
Sodium: 121 mmol/L — ABNORMAL LOW (ref 135–145)
Sodium: 123 mmol/L — ABNORMAL LOW (ref 135–145)
Sodium: 126 mmol/L — ABNORMAL LOW (ref 135–145)

## 2022-02-24 LAB — TSH: TSH: 2.163 u[IU]/mL (ref 0.350–4.500)

## 2022-02-24 LAB — CORTISOL-AM, BLOOD: Cortisol - AM: 24.1 ug/dL — ABNORMAL HIGH (ref 6.7–22.6)

## 2022-02-24 MED ORDER — SODIUM CHLORIDE 0.9 % IV SOLN
1.0000 g | INTRAVENOUS | Status: DC
  Administered 2022-02-24: 1 g via INTRAVENOUS
  Filled 2022-02-24 (×2): qty 10

## 2022-02-24 MED ORDER — PANTOPRAZOLE SODIUM 40 MG IV SOLR
40.0000 mg | Freq: Two times a day (BID) | INTRAVENOUS | Status: DC
  Administered 2022-02-24 – 2022-02-26 (×6): 40 mg via INTRAVENOUS
  Filled 2022-02-24 (×6): qty 10

## 2022-02-24 MED ORDER — TOLVAPTAN 15 MG PO TABS
15.0000 mg | ORAL_TABLET | Freq: Once | ORAL | Status: AC
  Administered 2022-02-24: 15 mg via ORAL
  Filled 2022-02-24: qty 1

## 2022-02-24 NOTE — Progress Notes (Signed)
Progress Note  Patient Name: Kristina [name] Date of Encounter: 02/24/2022  [facility] Cardiologist: Charlton Haws, MD   Subjective   Kristina [name] feels well today. She is able to coherently discuss her symptoms and is alert and oriented. No episodes of confusion. She states with drinking water she feels discomfort in her chest. Her symptoms have been consistent with indigestion. She notes subacute SOB with movement.   Bps well controlled Crt normal  Inpatient Medications    Scheduled Meds:  amLODipine  5 mg Oral BID   aspirin EC  81 mg Oral Daily   budesonide  3 mg Oral Daily   Chlorhexidine Gluconate Cloth  6 each Topical Daily   cyanocobalamin  1,000 mcg Oral Daily   enoxaparin (LOVENOX) injection  40 mg Subcutaneous Q24H   feeding supplement  237 mL Oral BID BM   insulin aspart  0-9 Units Subcutaneous TID WC   irbesartan  150 mg Oral Daily   levothyroxine  50 mcg Oral q morning   multivitamin with minerals  1 tablet Oral Daily   nebivolol  20 mg Oral Daily   pantoprazole (PROTONIX) IV  40 mg Intravenous Q12H   simvastatin  20 mg Oral QPM   sodium chloride flush  3 mL Intravenous Q12H   Continuous Infusions:  sodium chloride     PRN Meds: sodium chloride, bisacodyl, hydrALAZINE, hyoscyamine, labetalol, nitroGLYCERIN, ondansetron **OR** ondansetron (ZOFRAN) IV, mouth rinse, sodium chloride flush, traMADol   Vital Signs    Vitals:   02/24/22 1000 02/24/22 1027 02/24/22 1100 02/24/22 1200  BP:  (!) 122/27 (!) 94/39 (!) 115/51  Pulse: 78 73 71 73  Resp: Temp:      TempSrc:      SpO2: 99% 98% 99% 98%  Weight:      Height:        Intake/Output Summary (Last 24 hours) at 02/24/2022 1229 Last data filed at 02/24/2022 1201 Gross per 24 hour  Intake 803.19 ml  Output 1905 ml  Net -1101.81 ml      02/24/2022    5:00 AM 02/22/2022    9:08 PM 02/22/2022    8:43 AM  Last 3 Weights  Weight (lbs) 146 lb 6.2 oz 141 lb 15.6 oz 146 lb  Weight (kg) 66.4  kg 64.4 kg 66.225 kg      Telemetry    NSR - Personally Reviewed  ECG    No new - Personally Reviewed  Physical Exam   Vitals:   02/24/22 1100 02/24/22 1200  BP: (!) 94/39 (!) 115/51  Pulse: 71 73  Resp: 20 15  Temp:    SpO2: 99% 98%    GEN: No acute distress.  A&Ox3 Neck: No JVD Cardiac: RRR, no murmurs, rubs, or gallops.  Respiratory: Clear to auscultation bilaterally. GI: Soft, nontender, non-distended  MS: No edema; No deformity. Neuro:  Nonfocal  Psych: Normal affect   Labs    High Sensitivity Troponin:   Recent Labs  Lab 02/22/22 0851 02/22/22 1021  TROPONINIHS 5 5     Chemistry Recent Labs  Lab 02/22/22 0851 02/23/22 0313 02/23/22 1139 02/23/22 1337 02/23/22 1546 02/23/22 2358 02/24/22 0227 02/24/22 0710  NA 132* 125* 117* 118*   < > 121* 125* 126*  K 4.1 3.5 3.4* 3.5  --   --  3.4*  --   CL 99 89* 85* 83*  --   --  90*  --   CO2 22 20*  19* 23  --   --  23  --   GLUCOSE 131* 222* 225* 223*  --   --  160*  --   BUN 7* 11 13 15   --   --  14  --   CREATININE 0.57 0.56 0.56 0.67  --   --  0.60  --   CALCIUM 9.4 9.7 9.2 9.5  --   --  9.0  --   PROT 6.8 7.2  --   --   --   --   --   --   ALBUMIN 3.9 4.2  --   --   --   --   --   --   AST 19 22  --   --   --   --   --   --   ALT 12 14  --   --   --   --   --   --   ALKPHOS 52 59  --   --   --   --   --   --   BILITOT 0.9 1.1  --   --   --   --   --   --   GFRNONAA >60 >60 >60 >60  --   --  >60  --   ANIONGAP 11 16* 13 12  --   --  12  --    < > = values in this interval not displayed.    Lipids No results for input(s): "CHOL", "TRIG", "HDL", "LABVLDL", "LDLCALC", "CHOLHDL" in the last 168 hours.  Hematology Recent Labs  Lab 02/22/22 0851 02/23/22 0313  WBC 5.0 8.2  RBC 4.00 4.31  HGB 13.3 14.2  HCT 37.1 38.8  MCV 92.8 90.0  MCH 33.3 32.9  MCHC 35.8 36.6*  RDW 12.5 12.3  PLT 200 243   Thyroid  Recent Labs  Lab 02/24/22 0227  TSH 2.163    BNP Recent Labs  Lab 02/22/22 0851   BNP 225.3*    DDimer  Recent Labs  Lab 02/22/22 1719  DDIMER 0.52*     Radiology    ECHOCARDIOGRAM COMPLETE  Result Date: 02/23/2022    ECHOCARDIOGRAM REPORT   Patient Name:   Kristina [name] [facility] Date of Exam: 02/23/2022 Medical Rec #:  02/25/2022        Height:       64.0 in Accession #:    [id number]       Weight:       142.0 lb Date of Birth:  1946-06-09         BSA:          1.691 m Patient Age:    76 years         BP:           166/106 mmHg Patient Gender: F                HR:           76 bpm. Exam Location:  Inpatient Procedure: 2D Echo, Color Doppler, Cardiac Doppler and Intracardiac            Opacification Agent Indications:    Chest Pain R07.9  History:        Patient has prior history of Echocardiogram examinations, most                 recent 02/25/2020. CAD; Risk Factors:Diabetes. Thyroid disease.  Low sodium. GERD.  Referring Phys: [id number] HAO MENG IMPRESSIONS  1. Left ventricular ejection fraction, by estimation, is 55 to 60%. The left ventricle has normal function. The left ventricle has no regional wall motion abnormalities. Left ventricular diastolic parameters are consistent with Grade I diastolic dysfunction (impaired relaxation).  2. Right ventricular systolic function is normal. The right ventricular size is normal.  3. The mitral valve is grossly normal. No evidence of mitral valve regurgitation.  4. There is mild calcification of the aortic valve. Aortic valve regurgitation is not visualized.  5. The inferior vena cava is normal in size with greater than 50% respiratory variability, suggesting right atrial pressure of 3 mmHg. Comparison(s): No significant change from prior study. FINDINGS  Left Ventricle: Left ventricular ejection fraction, by estimation, is 55 to 60%. The left ventricle has normal function. The left ventricle has no regional wall motion abnormalities. Definity contrast agent was given IV to delineate the left ventricular  endocardial borders. The left  ventricular internal cavity size was normal in size. There is no left ventricular hypertrophy. Left ventricular diastolic parameters are consistent with Grade I diastolic dysfunction (impaired relaxation). Right Ventricle: The right ventricular size is normal. Right ventricular systolic function is normal. Left Atrium: Left atrial size was normal in size. Right Atrium: Right atrial size was normal in size. Pericardium: There is no evidence of pericardial effusion. Mitral Valve: The mitral valve is grossly normal. No evidence of mitral valve regurgitation. Tricuspid Valve: Tricuspid valve regurgitation is not demonstrated. Aortic Valve: There is mild calcification of the aortic valve. Aortic valve regurgitation is not visualized. Pulmonic Valve: The pulmonic valve was not well visualized. Pulmonic valve regurgitation is not visualized. Aorta: The aortic root and ascending aorta are structurally normal, with no evidence of dilitation. Venous: The inferior vena cava is normal in size with greater than 50% respiratory variability, suggesting right atrial pressure of 3 mmHg. IAS/Shunts: No atrial level shunt detected by color flow Doppler. Additional Comments: There is a small pleural effusion.  LEFT VENTRICLE PLAX 2D LVIDd:         4.10 cm     Diastology LVIDs:         3.00 cm     LV e' medial:    3.33 cm/s LV PW:         0.90 cm     LV E/e' medial:  17.8 LV IVS:        1.00 cm     LV e' lateral:   5.96 cm/s LVOT diam:     2.00 cm     LV E/e' lateral: 9.9 LV SV:         55 LV SV Index:   33 LVOT Area:     3.14 cm  LV Volumes (MOD) LV vol d, MOD A2C: 81.2 ml LV vol d, MOD A4C: 95.2 ml LV vol s, MOD A2C: 34.1 ml LV vol s, MOD A4C: 43.6 ml LV SV MOD A2C:     47.1 ml LV SV MOD A4C:     95.2 ml LV SV MOD BP:      48.1 ml RIGHT VENTRICLE RV S prime:     20.80 cm/s TAPSE (M-mode): 1.7 cm LEFT ATRIUM             Index        RIGHT ATRIUM           Index LA diam:        4.10 cm 2.42 cm/m  RA Area:     10.80 cm LA Vol (A2C):    33.2 ml 19.63 ml/m  RA Volume:   21.30 ml  12.59 ml/m LA Vol (A4C):   33.9 ml 20.04 ml/m LA Biplane Vol: 35.2 ml 20.81 ml/m  AORTIC VALVE LVOT Vmax:   81.30 cm/s LVOT Vmean:  59.400 cm/s LVOT VTI:    0.175 m  AORTA Ao Root diam: 2.80 cm Ao Asc diam:  3.00 cm MITRAL VALVE MV Area (PHT): 3.28 cm     SHUNTS MV Decel Time: 231 msec     Systemic VTI:  0.18 m MV E velocity: 59.20 cm/s   Systemic Diam: 2.00 cm MV A velocity: 103.00 cm/s MV E/A ratio:  0.57 PhotographerMary Lachanda [name] Electronically signed by Carolan ClinesMary Anabell Swint Signature Date/Time: 02/23/2022/2:47:42 PM    Final    MR BRAIN WO CONTRAST  Result Date: 02/22/2022 CLINICAL DATA:  Initial evaluation for neuro deficit, stroke suspected. EXAM: MRI HEAD WITHOUT CONTRAST MRA HEAD WITHOUT CONTRAST TECHNIQUE: Multiplanar, multi-echo pulse sequences of the brain and surrounding structures were acquired without intravenous contrast. Angiographic images of the Circle of Willis were acquired using MRA technique without intravenous contrast. COMPARISON:  CT from earlier the same day. FINDINGS: MRI HEAD FINDINGS Brain: Examination mildly degraded by motion artifact. Cerebral volume within normal limits. Patchy T2/FLAIR hyperintensity involving the periventricular deep white matter both cerebral hemispheres, most consistent with chronic small vessel ischemic disease, mild to moderate in nature. Mild patchy involvement of the pons noted. No evidence for acute or subacute ischemia. Gray-white matter differentiation maintained. No areas of chronic cortical infarction. No acute or chronic intracranial blood products. No mass lesion, midline shift or mass effect no hydrocephalus or extra-axial fluid collection. Pituitary gland and suprasellar region normal. Vascular: Major intracranial vascular flow voids are maintained. Skull and upper cervical spine: Craniocervical junction normal. Bone marrow signal intensity within normal limits. No scalp soft tissue abnormality. Sinuses/Orbits: Prior  bilateral ocular lens replacement. Mild scattered mucosal thickening noted about the ethmoidal air cells. Paranasal sinuses are otherwise clear. No significant mastoid effusion. Other: None. MRA HEAD FINDINGS Anterior circulation: Examination mildly degraded by motion artifact. Both internal carotid arteries patent to the termini without stenosis or other abnormality. A1 segments patent. Normal anterior communicating artery complex. Anterior cerebral arteries patent without visible stenosis. No M1 stenosis or occlusion. No proximal MCA [place] Glymph occlusion. Distal MCA branches perfused and symmetric. Posterior circulation: Both vertebral arteries patent to the vertebrobasilar junction without stenosis. Both PICA are patent. Basilar patent to its distal aspect without stenosis. Superior cerebellar and posterior cerebral arteries widely patent bilaterally. Anatomic variants: None significant.  No aneurysm. IMPRESSION: MRI HEAD IMPRESSION: 1. No acute intracranial infarct or other abnormality. 2. 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 MRA HEAD IMPRESSION: Negative intracranial MRA. No large vessel occlusion, hemodynamically significant stenosis, or other acute vascular abnormality. Electronically Signed   By: Rise MuBenjamin  McClintock M.D.   On: 02/22/2022 21:08   MR ANGIO HEAD WO CONTRAST  Result Date: 02/22/2022 CLINICAL DATA:  Initial evaluation for neuro deficit, stroke suspected. EXAM: MRI HEAD WITHOUT CONTRAST MRA HEAD WITHOUT CONTRAST TECHNIQUE: Multiplanar, multi-echo pulse sequences of the brain and surrounding structures were acquired without intravenous contrast. Angiographic images of the Circle of Willis were acquired using MRA technique without intravenous contrast. COMPARISON:  CT from earlier the same day. FINDINGS: MRI HEAD FINDINGS Brain: Examination mildly degraded by motion artifact. Cerebral volume within normal limits. Patchy T2/FLAIR hyperintensity involving the periventricular deep  white matter both cerebral hemispheres,  most consistent with chronic small vessel ischemic disease, mild to moderate in nature. Mild patchy involvement of the pons noted. No evidence for acute or subacute ischemia. Gray-white matter differentiation maintained. No areas of chronic cortical infarction. No acute or chronic intracranial blood products. No mass lesion, midline shift or mass effect no hydrocephalus or extra-axial fluid collection. Pituitary gland and suprasellar region normal. Vascular: Major intracranial vascular flow voids are maintained. Skull and upper cervical spine: Craniocervical junction normal. Bone marrow signal intensity within normal limits. No scalp soft tissue abnormality. Sinuses/Orbits: Prior bilateral ocular lens replacement. Mild scattered mucosal thickening noted about the ethmoidal air cells. Paranasal sinuses are otherwise clear. No significant mastoid effusion. Other: None. MRA HEAD FINDINGS Anterior circulation: Examination mildly degraded by motion artifact. Both internal carotid arteries patent to the termini without stenosis or other abnormality. A1 segments patent. Normal anterior communicating artery complex. Anterior cerebral arteries patent without visible stenosis. No M1 stenosis or occlusion. No proximal MCA [place]ii [place] occlusion. Distal MCA branches perfused and symmetric. Posterior circulation: Both vertebral arteries patent to the vertebrobasilar junction without stenosis. Both PICA are patent. Basilar patent to its distal aspect without stenosis. Superior cerebellar and posterior cerebral arteries widely patent bilaterally. Anatomic variants: None significant.  No aneurysm. IMPRESSION: MRI HEAD IMPRESSION: 1. No acute intracranial infarct or other abnormality. 2. 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 MRA HEAD IMPRESSION: Negative intracranial MRA. No large vessel occlusion, hemodynamically significant stenosis, or other acute vascular abnormality.  Electronically Signed   By: Rise Mu M.D.   On: 02/22/2022 21:08    Cardiac Studies   Echo 02/25/2020  1. Mid and basal inferior wall hypokinesis Normal GLS -15. Left  ventricular ejection fraction, by estimation, is 55%. The left ventricle  has normal function. The left ventricle has no regional wall motion  abnormalities. Left ventricular diastolic  parameters are consistent with Grade I diastolic dysfunction (impaired  relaxation).   2. Right ventricular systolic function is normal. The right ventricular  size is normal. There is normal pulmonary artery systolic pressure.   3. Left atrial size was moderately dilated.   4. The mitral valve is normal in structure. Mild mitral valve  regurgitation. No evidence of mitral stenosis.   5. The aortic valve is tricuspid. Aortic valve regurgitation is not  visualized. Mild aortic valve sclerosis is present, with no evidence of  aortic valve stenosis.   6. The inferior vena cava is normal in size with greater than 50%  respiratory variability, suggesting right atrial pressure of 3 mmHg.  Patient Profile     Kristina [name] is a 76 y.o. 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 [name].  Assessment & Plan     Hypertensive emergency: contributing to her encephalopathy. This has improved. Would not plan to be too aggressive. Can continue irbesartan 150 mg daily and nebivolol 20 mg daily. She is more lucid; with resolution of encephalopathy  Chest pain: she reported pain associated with drinking water and indigestion. This is c/f UGI cause of her chest discomfort. She notes SOB with mild movements. She has no signs of decompensated CHF. Her troponin is negative.  No acute ischemic changes on ECG.  Unchanged poor r wave progression. Negative troponin levels. Coronary CT in 2019 did not show any obstructive lesions. I don't suspect  her symptoms are cardiac related. She can follow up with planned outpatient stress test. Considering this is not emergent and logistical challenges; will cancel the inpatient coronary CTA.  She can be discharged from a cardiac perspective. No further inpatient work up planned. Pending PET CT stress. FU with Dr. Eden Emms.  For questions or updates, please contact [facility] Please consult [email or web address] for contact info under        Signed, Maisie Fus, MD  02/24/2022, 12:29 PM

## 2022-02-24 NOTE — Progress Notes (Addendum)
PROGRESS NOTE    Kristina [name]  ZSW:[id number] DOB: 12-10-1945 DOA: 02/22/2022 PCP: Oneita Hurt, No   Brief Narrative: Kristina [name] is a 76 y.o. female past medical history significant for labile hypertension, hyperlipidemia, diabetes type 2 , hypothyroidism, chronic diastolic heart failure, prior cardiac CT 2019 Calcium  score 122, 68 percentile Presents complaining of shortness of breath and chest pain at rest when she wake up this morning.  She reports chest pain and shortness of breath for the last month especially on exertion.  She relates that the chest pain and tightness has been progressively getting worse over the last  weeks.  This morning she developed worsening chest pain and dyspnea at rest.   She also noticed difficulty finding words since this morning, she is also feeling confused.  Her son noticed that she is foggy and having difficulty expressing herself.  She had some difficulty trying to figure out how to make a phone call from her phone, which she normally is be able to do.  Patient admitted with malignant hypertension.   Assessment & Plan:   Principal Problem:   HTN (hypertension), malignant Active Problems:   Diabetes mellitus without complication (HCC)   HTN (hypertension)   Hyponatremia   Thyroid disease   Hypercholesteremia   GERD (gastroesophageal reflux disease)   Chest pain   Headache   Chronic diastolic CHF (congestive heart failure) (HCC)  1-Malignant HTN;  Patient presents with a systolic blood pressure 207/105, shortness of breath, chest pain, confusion, difficulty finding words.  Continue.   -Continue with IV hydralazine and IV labetalol ordered. -Discontinue HCTZ due to hyponatremia.  -Continue with Nebivolol and Avapro.  Started on Norvasc by cardiology     2-Chest pain, shortness of breath: Could be related to high blood pressure. Troponin x2 negative Also Concern with angina, cardiology has been consulted.  PRN nitroglycerine Continue  Zocor. Started baby aspirin.  Chest x ray negative. Mildly elevated BNP. Chest pain and dyspnea improved.  Follow cardiology recommendation. Because chest pain now related when she drinks water, no plan for Coronary CT inpatient.    3-Headaches, Acute metabolic encephalopathy, Attention Deficit.  Suspect related to HTN/. And hyponatremia CT head: negative. ABG no hypercapnia.  MRI/MRA brain. Negative Tramadol PRN ordered.  TSH; 2.7. cortisol normal.  Her thought process has improved since admission.  Neurology consulted, attention deficit likely related to hypertension and hyponatremia.  If her mental status does not improve as would  be expected, neurology can be available to reassess.   4-Hypothyroidism; Continue with Synthroid.  5-DM type; SSI.  Hold home metformin.  6-Chronic diastolic HF; does not appears to be volume overload.  Chest x ray negative. Mildly elevated BNP.   7-Hyponatremia; Severe  Urine osmolality consistent with SIADH. Exacerbated In setting of HCTZ and vomiting.  Treated with hypertonic saline 3 %.  Sodium increase to 126.  Tolvaptan ordered by nephrology.  Continue to monitor sodium level.   Dysphagia;  Report chest pain while drinking water today. Felt water got stuck.  Plan to start IV protonix.  Will proceed with Esophagogram.   Pyuria; UA with 21-50 WBC.  Started IV ceftriaxone.   Estimated body mass index is 25.13 kg/m as calculated from the following:   Height as of this encounter: 5\' 4"  (1.626 m).   Weight as of this encounter: 66.4 kg.   DVT prophylaxis: Lovenox Code Status: Full code Family Communication:S sister at bedside.  Disposition Plan:  Status is: Observation The patient will require care  spanning > 2 midnights and should be moved to inpatient because: need evaluation of chest pain, BP management    Consultants:  Cardiology Neurology   Procedures:  ECHO  Antimicrobials:    Subjective: She is feeling better. She  report some chest pain and felt water got stock in her chest today.  She is breathing better. Her attention has improved.   Objective: Vitals:   02/24/22 0738 02/24/22 0800 02/24/22 0830 02/24/22 0900  BP: 132/66   (!) 165/107  Pulse: 73 73  73  Resp: Temp:   98.1 F (36.7 C)   TempSrc:   Oral   SpO2: 98% 98%  99%  Weight:      Height:        Intake/Output Summary (Last 24 hours) at 02/24/2022 0939 Last data filed at 02/24/2022 4098 Gross per 24 hour  Intake 803.19 ml  Output 1780 ml  Net -976.81 ml    Filed Weights   02/22/22 0843 02/22/22 2108 02/24/22 0500  Weight: 66.2 kg 64.4 kg 66.4 kg    Examination:  General exam: NAD Respiratory system: CTA Cardiovascular system: S 1, S 2 RRR Gastrointestinal system: BS present, soft, nt Central nervous system: Non focal.  Extremities: No edema    Data Reviewed: I have personally reviewed following labs and imaging studies  CBC: Recent Labs  Lab 02/22/22 0851 02/23/22 0313  WBC 5.0 8.2  NEUTROABS 4.0  --   HGB 13.3 14.2  HCT 37.1 38.8  MCV 92.8 90.0  PLT 200 243    Basic Metabolic Panel: Recent Labs  Lab 02/22/22 0851 02/23/22 0313 02/23/22 1139 02/23/22 1337 02/23/22 1546 02/23/22 1815 02/23/22 2143 02/23/22 2358 02/24/22 0227 02/24/22 0710  NA 132* 125* 117* 118*   < > 118* 121* 121* 125* 126*  K 4.1 3.5 3.4* 3.5  --   --   --   --  3.4*  --   CL 99 89* 85* 83*  --   --   --   --  90*  --   CO2 22 20* 19* 23  --   --   --   --  23  --   GLUCOSE 131* 222* 225* 223*  --   --   --   --  160*  --   BUN 7* --   --   --   --  14  --   CREATININE 0.57 0.56 0.56 0.67  --   --   --   --  0.60  --   CALCIUM 9.4 9.7 9.2 9.5  --   --   --   --  9.0  --    < > = values in this interval not displayed.    GFR: Estimated Creatinine Clearance: 55.2 mL/min (by C-G formula based on SCr of 0.6 mg/dL). Liver Function Tests: Recent Labs  Lab 02/22/22 0851 02/23/22 0313  AST 19 22  ALT 12  14  ALKPHOS 52 59  BILITOT 0.9 1.1  PROT 6.8 7.2  ALBUMIN 3.9 4.2    No results for input(s): "LIPASE", "AMYLASE" in the last 168 hours. No results for input(s): "AMMONIA" in the last 168 hours. Coagulation Profile: No results for input(s): "INR", "PROTIME" in the last 168 hours. Cardiac Enzymes: No results for input(s): "CKTOTAL", "CKMB", "CKMBINDEX", "TROPONINI" in the last 168 hours. BNP (last 3 results) No results for input(s): "PROBNP" in the last 8760 hours. HbA1C: Recent Labs  02/23/22 1815  HGBA1C 6.6*   CBG: Recent Labs  Lab 02/23/22 0751 02/23/22 1224 02/23/22 1621 02/23/22 2200 02/24/22 0719  GLUCAP 180* 245* 142* 215* 157*    Lipid Profile: No results for input(s): "CHOL", "HDL", "LDLCALC", "TRIG", "CHOLHDL", "LDLDIRECT" in the last 72 hours. Thyroid Function Tests: Recent Labs    02/24/22 0227  TSH 2.163    Anemia Panel: Recent Labs    02/23/22 1139  VITAMINB12 3,388*   Sepsis Labs: No results for input(s): "PROCALCITON", "LATICACIDVEN" in the last 168 hours.  Recent Results (from the past 240 hour(s))  MRSA Next Gen by PCR, Nasal     Status: None   Collection Time: 02/22/22  7:39 PM   Specimen: Nasal Mucosa; Nasal Swab  Result Value Ref Range Status   MRSA by PCR Next Gen NOT DETECTED NOT DETECTED Final    Comment: (NOTE) The GeneXpert MRSA Assay (FDA approved for NASAL specimens only), is one component of a comprehensive MRSA colonization surveillance program. It is not intended to diagnose MRSA infection nor to guide or monitor treatment for MRSA infections. Test performance is not FDA approved in patients less than 77 years old. Performed at [facility], [address] [place], [place] [address]          Radiology Studies: ECHOCARDIOGRAM COMPLETE  Result Date: 02/23/2022    ECHOCARDIOGRAM REPORT   Patient Name:   Kristina [name] Date of Exam: 02/23/2022 Medical Rec #:  [id number]        Height:       64.0  in Accession #:    [id number]       Weight:       142.0 lb Date of Birth:  04/22/1946         BSA:          1.691 m Patient Age:    76 years         BP:           166/106 mmHg Patient Gender: F                HR:           76 bpm. Exam Location:  Inpatient Procedure: 2D Echo, Color Doppler, Cardiac Doppler and Intracardiac            Opacification Agent Indications:    Chest Pain R07.9  History:        Patient has prior history of Echocardiogram examinations, most                 recent 02/25/2020. CAD; Risk Factors:Diabetes. Thyroid disease.                 Low sodium. GERD.  Referring Phys: [id number] HAO MENG IMPRESSIONS  1. Left ventricular ejection fraction, by estimation, is 55 to 60%. The left ventricle has normal function. The left ventricle has no regional wall motion abnormalities. Left ventricular diastolic parameters are consistent with Grade I diastolic dysfunction (impaired relaxation).  2. Right ventricular systolic function is normal. The right ventricular size is normal.  3. The mitral valve is grossly normal. No evidence of mitral valve regurgitation.  4. There is mild calcification of the aortic valve. Aortic valve regurgitation is not visualized.  5. The inferior vena cava is normal in size with greater than 50% respiratory variability, suggesting right atrial pressure of 3 mmHg. Comparison(s): No significant change from prior study. FINDINGS  Left Ventricle: Left ventricular ejection fraction, by estimation, is  55 to 60%. The left ventricle has normal function. The left ventricle has no regional wall motion abnormalities. Definity contrast agent was given IV to delineate the left ventricular  endocardial borders. The left ventricular internal cavity size was normal in size. There is no left ventricular hypertrophy. Left ventricular diastolic parameters are consistent with Grade I diastolic dysfunction (impaired relaxation). Right Ventricle: The right ventricular size is normal. Right ventricular  systolic function is normal. Left Atrium: Left atrial size was normal in size. Right Atrium: Right atrial size was normal in size. Pericardium: There is no evidence of pericardial effusion. Mitral Valve: The mitral valve is grossly normal. No evidence of mitral valve regurgitation. Tricuspid Valve: Tricuspid valve regurgitation is not demonstrated. Aortic Valve: There is mild calcification of the aortic valve. Aortic valve regurgitation is not visualized. Pulmonic Valve: The pulmonic valve was not well visualized. Pulmonic valve regurgitation is not visualized. Aorta: The aortic root and ascending aorta are structurally normal, with no evidence of dilitation. Venous: The inferior vena cava is normal in size with greater than 50% respiratory variability, suggesting right atrial pressure of 3 mmHg. IAS/Shunts: No atrial level shunt detected by color flow Doppler. Additional Comments: There is a small pleural effusion.  LEFT VENTRICLE PLAX 2D LVIDd:         4.10 cm     Diastology LVIDs:         3.00 cm     LV e' medial:    3.33 cm/s LV PW:         0.90 cm     LV E/e' medial:  17.8 LV IVS:        1.00 cm     LV e' lateral:   5.96 cm/s LVOT diam:     2.00 cm     LV E/e' lateral: 9.9 LV SV:         55 LV SV Index:   33 LVOT Area:     3.14 cm  LV Volumes (MOD) LV vol d, MOD A2C: 81.2 ml LV vol d, MOD A4C: 95.2 ml LV vol s, MOD A2C: 34.1 ml LV vol s, MOD A4C: 43.6 ml LV SV MOD A2C:     47.1 ml LV SV MOD A4C:     95.2 ml LV SV MOD BP:      48.1 ml RIGHT VENTRICLE RV S prime:     20.80 cm/s TAPSE (M-mode): 1.7 cm LEFT ATRIUM             Index        RIGHT ATRIUM           Index LA diam:        4.10 cm 2.42 cm/m   RA Area:     10.80 cm LA Vol (A2C):   33.2 ml 19.63 ml/m  RA Volume:   21.30 ml  12.59 ml/m LA Vol (A4C):   33.9 ml 20.04 ml/m LA Biplane Vol: 35.2 ml 20.81 ml/m  AORTIC VALVE LVOT Vmax:   81.30 cm/s LVOT Vmean:  59.400 cm/s LVOT VTI:    0.175 m  AORTA Ao Root diam: 2.80 cm Ao Asc diam:  3.00 cm MITRAL VALVE  MV Area (PHT): 3.28 cm     SHUNTS MV Decel Time: 231 msec     Systemic VTI:  0.18 m MV E velocity: 59.20 cm/s   Systemic Diam: 2.00 cm MV A velocity: 103.00 cm/s MV E/A ratio:  0.57 Photographer signed by Carolan Clines Signature Date/Time:  02/23/2022/2:47:42 PM    Final    MR BRAIN WO CONTRAST  Result Date: 02/22/2022 CLINICAL DATA:  Initial evaluation for neuro deficit, stroke suspected. EXAM: MRI HEAD WITHOUT CONTRAST MRA HEAD WITHOUT CONTRAST TECHNIQUE: Multiplanar, multi-echo pulse sequences of the brain and surrounding structures were acquired without intravenous contrast. Angiographic images of the Circle of Willis were acquired using MRA technique without intravenous contrast. COMPARISON:  CT from earlier the same day. FINDINGS: MRI HEAD FINDINGS Brain: Examination mildly degraded by motion artifact. Cerebral volume within normal limits. Patchy T2/FLAIR hyperintensity involving the periventricular deep white matter both cerebral hemispheres, most consistent with chronic small vessel ischemic disease, mild to moderate in nature. Mild patchy involvement of the pons noted. No evidence for acute or subacute ischemia. Gray-white matter differentiation maintained. No areas of chronic cortical infarction. No acute or chronic intracranial blood products. No mass lesion, midline shift or mass effect no hydrocephalus or extra-axial fluid collection. Pituitary gland and suprasellar region normal. Vascular: Major intracranial vascular flow voids are maintained. Skull and upper cervical spine: Craniocervical junction normal. Bone marrow signal intensity within normal limits. No scalp soft tissue abnormality. Sinuses/Orbits: Prior bilateral ocular lens replacement. Mild scattered mucosal thickening noted about the ethmoidal air cells. Paranasal sinuses are otherwise clear. No significant mastoid effusion. Other: None. MRA HEAD FINDINGS Anterior circulation: Examination mildly degraded by motion artifact.  Both internal carotid arteries patent to the termini without stenosis or other abnormality. A1 segments patent. Normal anterior communicating artery complex. Anterior cerebral arteries patent without visible stenosis. No M1 stenosis or occlusion. No proximal MCA branch occlusion. Distal MCA branches perfused and symmetric. Posterior circulation: Both vertebral arteries patent to the vertebrobasilar junction without stenosis. Both PICA are patent. Basilar patent to its distal aspect without stenosis. Superior cerebellar and posterior cerebral arteries widely patent bilaterally. Anatomic variants: None significant.  No aneurysm. IMPRESSION: MRI HEAD IMPRESSION: 1. No acute intracranial infarct or other abnormality. 2. 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 MRA HEAD IMPRESSION: Negative intracranial MRA. No large vessel occlusion, hemodynamically significant stenosis, or other acute vascular abnormality. Electronically Signed   By: Rise Mu M.D.   On: 02/22/2022 21:08   MR ANGIO HEAD WO CONTRAST  Result Date: 02/22/2022 CLINICAL DATA:  Initial evaluation for neuro deficit, stroke suspected. EXAM: MRI HEAD WITHOUT CONTRAST MRA HEAD WITHOUT CONTRAST TECHNIQUE: Multiplanar, multi-echo pulse sequences of the brain and surrounding structures were acquired without intravenous contrast. Angiographic images of the Circle of Willis were acquired using MRA technique without intravenous contrast. COMPARISON:  CT from earlier the same day. FINDINGS: MRI HEAD FINDINGS Brain: Examination mildly degraded by motion artifact. Cerebral volume within normal limits. Patchy T2/FLAIR hyperintensity involving the periventricular deep white matter both cerebral hemispheres, most consistent with chronic small vessel ischemic disease, mild to moderate in nature. Mild patchy involvement of the pons noted. No evidence for acute or subacute ischemia. Gray-white matter differentiation maintained. No areas of  chronic cortical infarction. No acute or chronic intracranial blood products. No mass lesion, midline shift or mass effect no hydrocephalus or extra-axial fluid collection. Pituitary gland and suprasellar region normal. Vascular: Major intracranial vascular flow voids are maintained. Skull and upper cervical spine: Craniocervical junction normal. Bone marrow signal intensity within normal limits. No scalp soft tissue abnormality. Sinuses/Orbits: Prior bilateral ocular lens replacement. Mild scattered mucosal thickening noted about the ethmoidal air cells. Paranasal sinuses are otherwise clear. No significant mastoid effusion. Other: None. MRA HEAD FINDINGS Anterior circulation: Examination mildly degraded by motion artifact. Both internal carotid arteries  patent to the termini without stenosis or other abnormality. A1 segments patent. Normal anterior communicating artery complex. Anterior cerebral arteries patent without visible stenosis. No M1 stenosis or occlusion. No proximal MCA branch occlusion. Distal MCA branches perfused and symmetric. Posterior circulation: Both vertebral arteries patent to the vertebrobasilar junction without stenosis. Both PICA are patent. Basilar patent to its distal aspect without stenosis. Superior cerebellar and posterior cerebral arteries widely patent bilaterally. Anatomic variants: None significant.  No aneurysm. IMPRESSION: MRI HEAD IMPRESSION: 1. No acute intracranial infarct or other abnormality. 2. 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 MRA HEAD IMPRESSION: Negative intracranial MRA. No large vessel occlusion, hemodynamically significant stenosis, or other acute vascular abnormality. Electronically Signed   By: Rise Mu M.D.   On: 02/22/2022 21:08   CT Head Wo Contrast  Result Date: 02/22/2022 CLINICAL DATA:  Headache, new or worrisome. EXAM: CT HEAD WITHOUT CONTRAST TECHNIQUE: Contiguous axial images were obtained from the base of the skull  through the vertex without intravenous contrast. RADIATION DOSE REDUCTION: This exam was performed according to the departmental dose-optimization program which includes automated exposure control, adjustment of the mA and/or kV according to patient size and/or use of iterative reconstruction technique. COMPARISON:  MRI examination dated October 07, 2021 FINDINGS: Brain: No evidence of acute infarction, hemorrhage, hydrocephalus, extra-axial collection or mass lesion/mass effect. Prominence of the ventricles and sulci secondary to moderate cerebral volume loss. Low-attenuation of the periventricular and subcortical white matter presumed chronic microvascular ischemic changes, unchanged. Vascular: No hyperdense vessel or unexpected calcification. Skull: Normal. Negative for fracture or focal lesion. Sinuses/Orbits: No acute finding.  Bilateral cataract surgery. Other: None. IMPRESSION: 1.  No acute intracranial abnormality. 2. 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 Electronically Signed   By: Larose Hires D.O.   On: 02/22/2022 10:18        Scheduled Meds:  amLODipine  5 mg Oral BID   aspirin EC  81 mg Oral Daily   budesonide  3 mg Oral Daily   Chlorhexidine Gluconate Cloth  6 each Topical Daily   cyanocobalamin  1,000 mcg Oral Daily   enoxaparin (LOVENOX) injection  40 mg Subcutaneous Q24H   feeding supplement  237 mL Oral BID BM   insulin aspart  0-9 Units Subcutaneous TID WC   irbesartan  150 mg Oral Daily   levothyroxine  50 mcg Oral q morning   multivitamin with minerals  1 tablet Oral Daily   nebivolol  20 mg Oral Daily   pantoprazole (PROTONIX) IV  40 mg Intravenous Q12H   simvastatin  20 mg Oral QPM   sodium chloride flush  3 mL Intravenous Q12H   Continuous Infusions:  sodium chloride       LOS: 1 day    Time spent: 35 minutes    Shiri Hodapp A Dameon Soltis, MD [facility]   If 7PM-7AM, please contact  night-coverage [email or web address]  02/24/2022, 9:39 AM

## 2022-02-24 NOTE — Progress Notes (Signed)
[facility] Progress Note  Subjective: seen in room, Ox 3, speech continues to improve, Na+ 128 this am  Vitals:   02/24/22 0500 02/24/22 0700 02/24/22 0738 02/24/22 0800  BP:  136/61 132/66   Pulse:  74 73 73  Resp:  17 18 17   Temp:      TempSrc:      SpO2:  98% 98% 98%  Weight: 66.4 kg     Height:        Exam: Gen alert, no distress No jvd or bruits Chest clear bilat to bases RRR no MRG Abd soft ntnd no mass or ascites +bs Ext no LE or UE edema Neuro is alert, Ox 3 , nf     Home meds include - budesnoide, cyanocobalamin, hyoscyamine, levothyroxine, metformin, simvastatin, valsartan- hctz qd, furosemide 20 qd, nebivolol 20 qd, prns/ vits/ supps      UA 21-50 wbc, 0-5 rbc, 30 prot    UCx pending     UNa 123, UOsm 485   Assessment/ Plan: Hyponatremia - developed after admission, preceded by N/V overnight in house.  Pt was euvolemic on exam. TSH and cortisol are wnl. No signs of CHF, renal / liver failure. Urine lytes would be c/w SIADH picture. Suspect due to acute nausea + thiazide exposure. HCTZ dc'd and Na+ improved w/ 3% saline from 117 nadir up to 126 yesterday. 3% saline dc'd and rec'd 15mg  tolvaptan x 1. Na= up to 128 this am. On liquid diet which is not helping (needs fluid restriction. Will start salt tabs/ low dose po lasix for hyponatremia. If possible advance diet to minimize fluid intake. Hopefully will cont to improve Confusion/ speech difficulties - better again today  Uncont HTN - cont irbesartan and bystolic. BP's good 130/ 60s.  HCTZ dc'd appropriately given low Na+, would avoid thiazides from here.      Rob Tracina Beaumont 02/24/2022, 8:31 AM   Recent Labs  Lab 02/22/22 0851 02/23/22 0313 02/23/22 1139 02/23/22 1337 02/24/22 0227  HGB 13.3 14.2  --   --   --   ALBUMIN 3.9 4.2  --   --   --   CALCIUM 9.4 9.7   < > 9.5 9.0  CREATININE 0.57 0.56   < > 0.67 0.60  K 4.1 3.5   < > 3.5 3.4*   < > = values in this interval not displayed.   No  results for input(s): "IRON", "TIBC", "FERRITIN" in the last 168 hours. Inpatient medications:  amLODipine  5 mg Oral BID   aspirin EC  81 mg Oral Daily   budesonide  3 mg Oral Daily   Chlorhexidine Gluconate Cloth  6 each Topical Daily   cyanocobalamin  1,000 mcg Oral Daily   enoxaparin (LOVENOX) injection  40 mg Subcutaneous Q24H   feeding supplement  237 mL Oral BID BM   insulin aspart  0-9 Units Subcutaneous TID WC   irbesartan  150 mg Oral Daily   levothyroxine  50 mcg Oral q morning   multivitamin with minerals  1 tablet Oral Daily   nebivolol  20 mg Oral Daily   pantoprazole (PROTONIX) IV  40 mg Intravenous Q12H   simvastatin  20 mg Oral QPM   sodium chloride flush  3 mL Intravenous Q12H   tolvaptan  15 mg Oral Once    sodium chloride     sodium chloride, bisacodyl, hydrALAZINE, hyoscyamine, labetalol, nitroGLYCERIN, ondansetron **OR** ondansetron (ZOFRAN) IV, mouth rinse, sodium chloride flush, traMADol

## 2022-02-25 DIAGNOSIS — I10 Essential (primary) hypertension: Secondary | ICD-10-CM | POA: Diagnosis not present

## 2022-02-25 LAB — GLUCOSE, CAPILLARY
Glucose-Capillary: 142 mg/dL — ABNORMAL HIGH (ref 70–99)
Glucose-Capillary: 262 mg/dL — ABNORMAL HIGH (ref 70–99)
Glucose-Capillary: 295 mg/dL — ABNORMAL HIGH (ref 70–99)
Glucose-Capillary: 157 mg/dL — ABNORMAL HIGH (ref 70–99)

## 2022-02-25 LAB — BASIC METABOLIC PANEL
Anion gap: 10 (ref 5–15)
Anion gap: 10 (ref 5–15)
BUN: 22 mg/dL (ref 8–23)
BUN: 20 mg/dL (ref 8–23)
CO2: 23 mmol/L (ref 22–32)
CO2: 24 mmol/L (ref 22–32)
Calcium: 8.7 mg/dL — ABNORMAL LOW (ref 8.9–10.3)
Calcium: 9 mg/dL (ref 8.9–10.3)
Chloride: 93 mmol/L — ABNORMAL LOW (ref 98–111)
Chloride: 94 mmol/L — ABNORMAL LOW (ref 98–111)
Creatinine, Ser: 0.79 mg/dL (ref 0.44–1.00)
Creatinine, Ser: 0.82 mg/dL (ref 0.44–1.00)
GFR, Estimated: >60 mL/min (ref >60)
GFR, Estimated: >60 mL/min (ref >60)
Glucose, Bld: 126 mg/dL — ABNORMAL HIGH (ref 70–99)
Glucose, Bld: 145 mg/dL — ABNORMAL HIGH (ref 70–99)
Potassium: 3.4 mmol/L — ABNORMAL LOW (ref 3.5–5.1)
Potassium: 3.4 mmol/L — ABNORMAL LOW (ref 3.5–5.1)
Sodium: 126 mmol/L — ABNORMAL LOW (ref 135–145)
Sodium: 128 mmol/L — ABNORMAL LOW (ref 135–145)

## 2022-02-25 LAB — URINE CULTURE: Culture: NO GROWTH

## 2022-02-25 LAB — SODIUM: Sodium: 135 mmol/L (ref 135–145)

## 2022-02-25 MED ORDER — FUROSEMIDE 20 MG PO TABS
10.0000 mg | ORAL_TABLET | Freq: Two times a day (BID) | ORAL | Status: DC
  Administered 2022-02-25 – 2022-02-28 (×6): 10 mg via ORAL
  Filled 2022-02-25 (×6): qty 1

## 2022-02-25 MED ORDER — POTASSIUM CHLORIDE 20 MEQ PO PACK: 20.0000 meq | PACK | Freq: Two times a day (BID) | ORAL | Status: DC

## 2022-02-25 MED ORDER — SODIUM CHLORIDE 1 G PO TABS
2.0000 g | ORAL_TABLET | Freq: Two times a day (BID) | ORAL | Status: DC
  Administered 2022-02-25 – 2022-02-26 (×3): 2 g via ORAL
  Filled 2022-02-25 (×3): qty 2

## 2022-02-25 MED ORDER — POTASSIUM CHLORIDE CRYS ER 10 MEQ PO TBCR
20.0000 meq | EXTENDED_RELEASE_TABLET | Freq: Two times a day (BID) | ORAL | Status: AC
Start: 2022-02-25 — End: 2022-02-25
  Administered 2022-02-25 (×2): 20 meq via ORAL
  Filled 2022-02-25 (×2): qty 2

## 2022-02-25 NOTE — Progress Notes (Signed)
PROGRESS NOTE    Kristina [name]  TDV:[id number] DOB: Jun 27, 1946 DOA: 02/22/2022 PCP: Oneita Hurt, No   Brief Narrative: Kristina [name] is a 76 y.o. female past medical history significant for labile hypertension, hyperlipidemia, diabetes type 2 , hypothyroidism, chronic diastolic heart failure, prior cardiac CT 2019 Calcium  score 122, 68 percentile Presents complaining of shortness of breath and chest pain at rest when she wake up this morning.  She reports chest pain and shortness of breath for the last month especially on exertion.  She relates that the chest pain and tightness has been progressively getting worse over the last  weeks.  This morning she developed worsening chest pain and dyspnea at rest.   She also noticed difficulty finding words since this morning, she is also feeling confused.  Her son noticed that she is foggy and having difficulty expressing herself.  She had some difficulty trying to figure out how to make a phone call from her phone, which she normally is be able to do.  Patient admitted with malignant hypertension. Develops severe hyponatremia.   Assessment & Plan:   Principal Problem:   HTN (hypertension), malignant Active Problems:   Diabetes mellitus without complication (HCC)   HTN (hypertension)   Hyponatremia   Thyroid disease   Hypercholesteremia   GERD (gastroesophageal reflux disease)   Chest pain   Headache   Chronic diastolic CHF (congestive heart failure) (HCC)  1-Malignant HTN;  Patient presents with a systolic blood pressure 207/105, shortness of breath, chest pain, confusion, difficulty finding words. - IV hydralazine and IV labetalol PRN -Discontinue HCTZ due to hyponatremia.  -Discontinue Norvasc.  Her BP is labile. She was hypotensive overnight. Holding meds this am.     2-Chest pain, shortness of breath: Could be related to high blood pressure. Troponin x2 negative Also Concern with angina, cardiology has been consulted.  PRN  nitroglycerine Continue Zocor. Started baby aspirin.  Chest x ray negative. Mildly elevated BNP. Chest pain and dyspnea improved.  Cardiology recommendation. Because chest pain now related when she drinks water, no plan for Coronary CT inpatient.    3-Headaches, Acute metabolic encephalopathy, Attention Deficit.  Suspect related to HTN/. And hyponatremia CT head: negative. ABG no hypercapnia.  MRI/MRA brain. Negative Tramadol PRN ordered.  TSH; 2.7. cortisol normal.  Neurology consulted, attention deficit likely related to hypertension and hyponatremia.  If her mental status does not improve as would  be expected, neurology can be available to reassess.  Improved.   4-Hypothyroidism; Continue with Synthroid.  5-DM type; SSI.  Hold home metformin.  6-Chronic diastolic HF; does not appears to be volume overload.  Chest x ray negative. Mildly elevated BNP.   7-Hyponatremia; Severe  Urine osmolality consistent with SIADH. Exacerbated In setting of HCTZ and vomiting.  Treated with hypertonic saline 3 %.  Sodium increase to 128.  Received Tolvaptan 7/29. Continue to monitor sodium level.  Started on sodium tablet and low dose lasix.   Dysphagia;  Report chest pain while drinking water today. Felt water got stuck.  Continue with  IV protonix.  Will proceed with Esophagogram.   Pyuria; UA with 21-50 WBC.  Urine no growth.  Discontinue antibiotics.   Estimated body mass index is 24.29 kg/m as calculated from the following:   Height as of this encounter: 5\' 4"  (1.626 m).   Weight as of this encounter: 64.2 kg.   DVT prophylaxis: Lovenox Code Status: Full code Family Communication: Son at bedside.  Disposition Plan:  Status is: Observation  The patient will require care spanning > 2 midnights and should be moved to inpatient because: need evaluation of chest pain, BP management    Consultants:  Cardiology Neurology   Procedures:  ECHO  Antimicrobials:     Subjective: She is alert, her MS has improved significantly,   Objective: Vitals:   02/25/22 1245 02/25/22 1300 02/25/22 1409 02/25/22 1415  BP:  (!) 122/58 (!) 125/50   Pulse:  72  73  Resp:  16  19  Temp: 98.4 F (36.9 C)     TempSrc: Oral     SpO2:  100%  97%  Weight:      Height:        Intake/Output Summary (Last 24 hours) at 02/25/2022 1513 Last data filed at 02/25/2022 1400 Gross per 24 hour  Intake 1183 ml  Output 2325 ml  Net -1142 ml    Filed Weights   02/22/22 2108 02/24/22 0500 02/25/22 0500  Weight: 64.4 kg 66.4 kg 64.2 kg    Examination:  General exam: NAD Respiratory system: CTA Cardiovascular system: S 1, S 2 RRR Gastrointestinal system: BS present, soft, nt Central nervous system: Non focal.  Extremities: No edema    Data Reviewed: I have personally reviewed following labs and imaging studies  CBC: Recent Labs  Lab 02/22/22 0851 02/23/22 0313  WBC 5.0 8.2  NEUTROABS 4.0  --   HGB 13.3 14.2  HCT 37.1 38.8  MCV 92.8 90.0  PLT 200 243    Basic Metabolic Panel: Recent Labs  Lab 02/23/22 1139 02/23/22 1337 02/23/22 1546 02/24/22 0227 02/24/22 0710 02/24/22 1629 02/25/22 0054 02/25/22 0806  NA 117* 118*   < > 125* 126* 123* 126* 128*  K 3.4* 3.5  --  3.4*  --   --  3.4* 3.4*  CL 85* 83*  --  90*  --   --  93* 94*  CO2 19* 23  --  23  --   --  23 24  GLUCOSE 225* 223*  --  160*  --   --  126* 145*  BUN 13 15  --  14  --   --  22 20  CREATININE 0.56 0.67  --  0.60  --   --  0.79 0.82  CALCIUM 9.2 9.5  --  9.0  --   --  8.7* 9.0   < > = values in this interval not displayed.    GFR: Estimated Creatinine Clearance: 49.6 mL/min (by C-G formula based on SCr of 0.82 mg/dL). Liver Function Tests: Recent Labs  Lab 02/22/22 0851 02/23/22 0313  AST 19 22  ALT 12 14  ALKPHOS 52 59  BILITOT 0.9 1.1  PROT 6.8 7.2  ALBUMIN 3.9 4.2    No results for input(s): "LIPASE", "AMYLASE" in the last 168 hours. No results for  input(s): "AMMONIA" in the last 168 hours. Coagulation Profile: No results for input(s): "INR", "PROTIME" in the last 168 hours. Cardiac Enzymes: No results for input(s): "CKTOTAL", "CKMB", "CKMBINDEX", "TROPONINI" in the last 168 hours. BNP (last 3 results) No results for input(s): "PROBNP" in the last 8760 hours. HbA1C: Recent Labs    02/23/22 1815  HGBA1C 6.6*    CBG: Recent Labs  Lab 02/24/22 1141 02/24/22 1620 02/24/22 2130 02/25/22 0820 02/25/22 1143  GLUCAP 221* 305* 102* 142* 262*    Lipid Profile: No results for input(s): "CHOL", "HDL", "LDLCALC", "TRIG", "CHOLHDL", "LDLDIRECT" in the last 72 hours. Thyroid Function Tests: Recent Labs  02/24/22 0227  TSH 2.163    Anemia Panel: Recent Labs    02/23/22 1139  VITAMINB12 3,388*    Sepsis Labs: No results for input(s): "PROCALCITON", "LATICACIDVEN" in the last 168 hours.  Recent Results (from the past 240 hour(s))  MRSA Next Gen by PCR, Nasal     Status: None   Collection Time: 02/22/22  7:39 PM   Specimen: Nasal Mucosa; Nasal Swab  Result Value Ref Range Status   MRSA by PCR Next Gen NOT DETECTED NOT DETECTED Final    Comment: (NOTE) The GeneXpert MRSA Assay (FDA approved for NASAL specimens only), is one component of a comprehensive MRSA colonization surveillance program. It is not intended to diagnose MRSA infection nor to guide or monitor treatment for MRSA infections. Test performance is not FDA approved in patients less than 94 years old. Performed at [facility], [address] [place], [place] [address]   Urine Culture     Status: None   Collection Time: 02/23/22  5:46 PM   Specimen: Urine, Catheterized  Result Value Ref Range Status   Specimen Description   Final    URINE, CATHETERIZED Performed at [facility], [address] [place], [place] [address]    Special Requests   Final    NONE Performed at [facility], [address] [place], [place] [address]    Culture   Final    NO GROWTH Performed at [facility], [address] [place], [place] [address]    Report Status 02/25/2022 FINAL  Final         Radiology Studies: No results found.      Scheduled Meds:  aspirin EC  81 mg Oral Daily   budesonide  3 mg Oral Daily   Chlorhexidine Gluconate Cloth  6 each Topical Daily   cyanocobalamin  1,000 mcg Oral Daily   enoxaparin (LOVENOX) injection  40 mg Subcutaneous Q24H   feeding supplement  237 mL Oral BID BM   furosemide  10 mg Oral BID   insulin aspart  0-9 Units Subcutaneous TID WC   irbesartan  150 mg Oral Daily   levothyroxine  50 mcg Oral q morning   multivitamin with minerals  1 tablet Oral Daily   nebivolol  20 mg Oral Daily   pantoprazole (PROTONIX) IV  40 mg Intravenous Q12H   potassium chloride  20 mEq Oral BID   simvastatin  20 mg Oral QPM   sodium chloride flush  3 mL Intravenous Q12H   sodium chloride  2 g Oral BID WC   Continuous Infusions:  sodium chloride       LOS: 2 days    Time spent: 35 minutes    Quentyn Kolbeck A Cressie Betzler, MD [facility]   If 7PM-7AM, please contact night-coverage [email or web address]  02/25/2022, 3:13 PM

## 2022-02-26 ENCOUNTER — Inpatient Hospital Stay (HOSPITAL_COMMUNITY): Payer: Medicare PPO

## 2022-02-26 DIAGNOSIS — I10 Essential (primary) hypertension: Secondary | ICD-10-CM | POA: Diagnosis not present

## 2022-02-26 LAB — GLUCOSE, CAPILLARY
Glucose-Capillary: 169 mg/dL — ABNORMAL HIGH (ref 70–99)
Glucose-Capillary: 148 mg/dL — ABNORMAL HIGH (ref 70–99)
Glucose-Capillary: 297 mg/dL — ABNORMAL HIGH (ref 70–99)
Glucose-Capillary: 203 mg/dL — ABNORMAL HIGH (ref 70–99)

## 2022-02-26 LAB — BASIC METABOLIC PANEL
Anion gap: 9 (ref 5–15)
BUN: 20 mg/dL (ref 8–23)
CO2: 23 mmol/L (ref 22–32)
Calcium: 9 mg/dL (ref 8.9–10.3)
Chloride: 102 mmol/L (ref 98–111)
Creatinine, Ser: 0.74 mg/dL (ref 0.44–1.00)
GFR, Estimated: >60 mL/min (ref >60)
Glucose, Bld: 155 mg/dL — ABNORMAL HIGH (ref 70–99)
Potassium: 4.5 mmol/L (ref 3.5–5.1)
Sodium: 134 mmol/L — ABNORMAL LOW (ref 135–145)

## 2022-02-26 MED ORDER — ACETAMINOPHEN 325 MG PO TABS
650.0000 mg | ORAL_TABLET | Freq: Four times a day (QID) | ORAL | Status: DC | PRN
Start: 2022-02-26 — End: 2022-02-28
  Administered 2022-02-26 – 2022-02-27 (×2): 650 mg via ORAL
  Filled 2022-02-26 (×2): qty 2

## 2022-02-26 NOTE — Progress Notes (Signed)
PROGRESS NOTE    Kristina [name]  SVX:[id number] DOB: 1946-04-19 DOA: 02/22/2022 PCP: Oneita Hurt, No   Brief Narrative: Kristina [name] is a 76 y.o. female past medical history significant for labile hypertension, hyperlipidemia, diabetes type 2 , hypothyroidism, chronic diastolic heart failure, prior cardiac CT 2019 Calcium  score 122, 68 percentile Presents complaining of shortness of breath and chest pain at rest when she wake up this morning.  She reports chest pain and shortness of breath for the last month especially on exertion.  She relates that the chest pain and tightness has been progressively getting worse over the last  weeks.  This morning she developed worsening chest pain and dyspnea at rest.   She also noticed difficulty finding words since this morning, she is also feeling confused.  Her son noticed that she is foggy and having difficulty expressing herself.  She had some difficulty trying to figure out how to make a phone call from her phone, which she normally is be able to do.  Patient admitted with malignant hypertension. Develops severe hyponatremia.   Assessment & Plan:   Principal Problem:   HTN (hypertension), malignant Active Problems:   Diabetes mellitus without complication (HCC)   HTN (hypertension)   Hyponatremia   Thyroid disease   Hypercholesteremia   GERD (gastroesophageal reflux disease)   Chest pain   Headache   Chronic diastolic CHF (congestive heart failure) (HCC)  1-Malignant HTN;  Patient presents with a systolic blood pressure 207/105, shortness of breath, chest pain, confusion, difficulty finding words. - IV hydralazine and IV labetalol PRN -Discontinue HCTZ due to hyponatremia.  -Discontinue Norvasc.  Her BP is labile. She was hypotensive 7/29.  Discontinue nevibolol.  Order parameter for Avapro/    2-Chest pain, shortness of breath: Could be related to high blood pressure. Troponin x2 negative Also Concern with angina, cardiology has been  consulted.  PRN nitroglycerine Continue Zocor. Started baby aspirin.  Chest x ray negative. Mildly elevated BNP. Chest pain and dyspnea improved.  Cardiology recommendation. Because chest pain now related when she drinks water, no plan for Coronary CT inpatient.    3-Headaches, Acute metabolic encephalopathy, Attention Deficit.  Suspect related to HTN/. And hyponatremia CT head: negative. ABG no hypercapnia.  MRI/MRA brain. Negative Tramadol PRN ordered.  TSH; 2.7. cortisol normal.  Neurology consulted, attention deficit likely related to hypertension and hyponatremia.  If her mental status does not improve as would  be expected, neurology can be available to reassess.  Improved.   4-Hypothyroidism; Continue with Synthroid.  5-DM type; SSI.  Hold home metformin.  6-Chronic diastolic HF; does not appears to be volume overload.  Chest x ray negative. Mildly elevated BNP.   7-Hyponatremia; Severe  Urine osmolality consistent with SIADH. Exacerbated In setting of HCTZ and vomiting.  Treated with hypertonic saline 3 %.  Sodium increase to 128.  Received Tolvaptan 7/29. Continue to monitor sodium level.  Improved.  Continue with lasix. Stop sodium tablet.   Dysphagia;  Report chest pain while drinking water today. Felt water got stuck.  Continue with  IV protonix.  Esophagogram. Mild esophageal dysmotility   Pyuria; UA with 21-50 WBC.  Urine no growth.  Discontinue antibiotics.   Estimated body mass index is 24.41 kg/m as calculated from the following:   Height as of this encounter: 5\' 4"  (1.626 m).   Weight as of this encounter: 64.5 kg.   DVT prophylaxis: Lovenox Code Status: Full code Family Communication: Son at bedside. 7/30 Disposition Plan:  Status is: Observation The patient will require care spanning > 2 midnights and should be moved to inpatient because: need evaluation of chest pain, BP management    Consultants:  Cardiology Neurology   Procedures:   ECHO  Antimicrobials:    Subjective: MS back to baseline.  Objective: Vitals:   02/26/22 0400 02/26/22 0500 02/26/22 0600 02/26/22 0700  BP: (!) 120/39  (!) 122/51   Pulse: 68 69 (P) 68 66  Resp: 20 (!) 24 17 (!) 8  Temp:      TempSrc:      SpO2: 96% 97% 95% 93%  Weight:  64.5 kg    Height:        Intake/Output Summary (Last 24 hours) at 02/26/2022 0746 Last data filed at 02/26/2022 3235 Gross per 24 hour  Intake 1061.52 ml  Output 1275 ml  Net -213.48 ml    Filed Weights   02/24/22 0500 02/25/22 0500 02/26/22 0500  Weight: 66.4 kg 64.2 kg 64.5 kg    Examination:  General exam: NAD Respiratory system: CTA Cardiovascular system: S 1, S 2 RRR Gastrointestinal system: BS present, soft,  Central nervous system: Non focal.  Extremities: No edema    Data Reviewed: I have personally reviewed following labs and imaging studies  CBC: Recent Labs  Lab 02/22/22 0851 02/23/22 0313  WBC 5.0 8.2  NEUTROABS 4.0  --   HGB 13.3 14.2  HCT 37.1 38.8  MCV 92.8 90.0  PLT 200 243    Basic Metabolic Panel: Recent Labs  Lab 02/23/22 1337 02/23/22 1546 02/24/22 0227 02/24/22 0710 02/24/22 1629 02/25/22 0054 02/25/22 0806 02/25/22 1707 02/26/22 0318  NA 118*   < > 125*   < > 123* 126* 128* 135 134*  K 3.5  --  3.4*  --   --  3.4* 3.4*  --  4.5  CL 83*  --  90*  --   --  93* 94*  --  102  CO2 23  --  23  --   --  23 24  --  23  GLUCOSE 223*  --  160*  --   --  126* 145*  --  155*  BUN 15  --  14  --   --  22 20  --  20  CREATININE 0.67  --  0.60  --   --  0.79 0.82  --  0.74  CALCIUM 9.5  --  9.0  --   --  8.7* 9.0  --  9.0   < > = values in this interval not displayed.    GFR: Estimated Creatinine Clearance: 50.9 mL/min (by C-G formula based on SCr of 0.74 mg/dL). Liver Function Tests: Recent Labs  Lab 02/22/22 0851 02/23/22 0313  AST 19 22  ALT 12 14  ALKPHOS 52 59  BILITOT 0.9 1.1  PROT 6.8 7.2  ALBUMIN 3.9 4.2    No results for input(s):  "LIPASE", "AMYLASE" in the last 168 hours. No results for input(s): "AMMONIA" in the last 168 hours. Coagulation Profile: No results for input(s): "INR", "PROTIME" in the last 168 hours. Cardiac Enzymes: No results for input(s): "CKTOTAL", "CKMB", "CKMBINDEX", "TROPONINI" in the last 168 hours. BNP (last 3 results) No results for input(s): "PROBNP" in the last 8760 hours. HbA1C: Recent Labs    02/23/22 1815  HGBA1C 6.6*    CBG: Recent Labs  Lab 02/24/22 2130 02/25/22 0820 02/25/22 1143 02/25/22 1657 02/25/22 2140  GLUCAP 102* 142* 262* 295* 157*  Lipid Profile: No results for input(s): "CHOL", "HDL", "LDLCALC", "TRIG", "CHOLHDL", "LDLDIRECT" in the last 72 hours. Thyroid Function Tests: Recent Labs    02/24/22 0227  TSH 2.163    Anemia Panel: Recent Labs    02/23/22 1139  VITAMINB12 3,388*    Sepsis Labs: No results for input(s): "PROCALCITON", "LATICACIDVEN" in the last 168 hours.  Recent Results (from the past 240 hour(s))  MRSA Next Gen by PCR, Nasal     Status: None   Collection Time: 02/22/22  7:39 PM   Specimen: Nasal Mucosa; Nasal Swab  Result Value Ref Range Status   MRSA by PCR Next Gen NOT DETECTED NOT DETECTED Final    Comment: (NOTE) The GeneXpert MRSA Assay (FDA approved for NASAL specimens only), is one component of a comprehensive MRSA colonization surveillance program. It is not intended to diagnose MRSA infection nor to guide or monitor treatment for MRSA infections. Test performance is not FDA approved in patients less than 5 years old. Performed at [facility], [place] [address] [place], [place] [address]   Urine Culture     Status: None   Collection Time: 02/23/22  5:46 PM   Specimen: Urine, Catheterized  Result Value Ref Range Status   Specimen Description   Final    URINE, CATHETERIZED Performed at [place] [address], [place], [place] [address]    Special Requests   Final     NONE Performed at [facility], [place] [address], [place], [place] [address]    Culture   Final    NO GROWTH Performed at [facility], [place] [address], [place], [place] [address]    Report Status 02/25/2022 FINAL  Final         Radiology Studies: No results found.      Scheduled Meds:  aspirin EC  81 mg Oral Daily   budesonide  3 mg Oral Daily   Chlorhexidine Gluconate Cloth  6 each Topical Daily   cyanocobalamin  1,000 mcg Oral Daily   enoxaparin (LOVENOX) injection  40 mg Subcutaneous Q24H   feeding supplement  237 mL Oral BID BM   furosemide  10 mg Oral BID   insulin aspart  0-9 Units Subcutaneous TID WC   irbesartan  150 mg Oral Daily   levothyroxine  50 mcg Oral q morning   multivitamin with minerals  1 tablet Oral Daily   nebivolol  20 mg Oral Daily   pantoprazole (PROTONIX) IV  40 mg Intravenous Q12H   simvastatin  20 mg Oral QPM   sodium chloride flush  3 mL Intravenous Q12H   sodium chloride  2 g Oral BID WC   Continuous Infusions:  sodium chloride 10 mL/hr at 02/26/22 0611     LOS: 3 days    Time spent: 35 minutes    Heru Montz A Levoy Geisen, MD [facility]   If 7PM-7AM, please contact night-coverage [email or web address]  02/26/2022, 7:46 AM

## 2022-02-26 NOTE — Progress Notes (Signed)
Brief nephrology note: Chart reviewed and discussed with the primary team.  The serum sodium level has improved. Recommend to discontinue hydrochlorothiazide. Continue low-dose furosemide Continue free water restriction Discontinue salt tablet. Repeat BMP in a week with PCP to make sure sodium remains stable. Please call us with question.  Eddie North, [facility].

## 2022-02-26 NOTE — Progress Notes (Signed)
PT Cancellation Note  Patient Details Name: Kristina [name] MRN: [id number] DOB: 11-Jul-1946   Cancelled Treatment:    Reason Eval/Treat Not Completed: Patient at procedure or test/unavailable (Pt at esophagogram, will follow up at later date/time as schedule allows.)  Renaldo Fiddler PT, DPT Acute Rehabilitation Services Office [phone] Pager [phone]  02/26/22 11:24 AM

## 2022-02-26 NOTE — Evaluation (Signed)
Occupational Therapy Evaluation Patient Details Name: Kristina [name] MRN: [id number] DOB: 12/23/1945 Today's Date: 02/26/2022   History of Present Illness Kristina [name] is a 76 y.o. female presented complaining of shortness of breath, chest pain at rest, difficulty finding words since this morning, she is also feeling confused,occipital headache. Admitted with malignant hypertension. CT/MRI: negative for acute event. PHMx: labile hypertension, hyperlipidemia, diabetes type, hypothyroidism, chronic diastolic heart failure, anxiety.   Clinical Impression   This 76 yo female admitted with above presents to acute OT with PLOF of being totally independent to Mod I with all basic ADLs, IADLs, and driving with only occasionally using a SPC.Pt currently at setup-min A level for basic ADLs and min A with RW for ambulation. She will continue to benefit from acute OT with follow up at SNF.      Recommendations for follow up therapy are one component of a multi-disciplinary discharge planning process, led by the attending physician.  Recommendations may be updated based on patient status, additional functional criteria and insurance authorization.   Follow Up Recommendations  Skilled nursing-short term rehab (<3 hours/day)    Assistance Recommended at Discharge None (if goes to rehab first; frequent/constant if goes home)  Patient can return home with the following A little help with bathing/dressing/bathroom;A little help with walking and/or transfers;Assistance with cooking/housework;Help with stairs or ramp for entrance;Assist for transportation    Functional Status Assessment  Patient has had a recent decline in their functional status and demonstrates the ability to make significant improvements in function in a reasonable and predictable amount of time.  Equipment Recommendations  None recommended by OT       Precautions / Restrictions Precautions Precautions: Fall Precaution Comments:  reports she fell about 2 months ago Restrictions Weight Bearing Restrictions: No      Mobility Bed Mobility Overal bed mobility: Needs Assistance Bed Mobility: Supine to Sit     Supine to sit: Supervision, HOB elevated          Transfers Overall transfer level: Needs assistance Equipment used: Rolling walker (2 wheels) Transfers: Sit to/from Stand Sit to Stand: Min assist           General transfer comment: stated she feels a little off balance due to the bottom of he feet being sore; VSS      Balance Overall balance assessment: Needs assistance Sitting-balance support: No upper extremity supported, Feet supported Sitting balance-Leahy Scale: Good     Standing balance support: Bilateral upper extremity supported, Reliant on assistive device for balance Standing balance-Leahy Scale: Poor                             ADL either performed or assessed with clinical judgement   ADL Overall ADL's : Needs assistance/impaired Eating/Feeding: Independent;Sitting   Grooming: Set up;Standing   Upper Body Bathing: Set up;Sitting   Lower Body Bathing: Minimal assistance;Sit to/from stand   Upper Body Dressing : Set up;Standing   Lower Body Dressing: Minimal assistance;Sit to/from stand   Toilet Transfer: Minimal assistance;Ambulation;Rolling walker (2 wheels) Toilet Transfer Details (indicate cue type and reason): simulated bed>out in hallway/back to room(50 feet)>sit in recliner Toileting- Clothing Manipulation and Hygiene: Minimal assistance;Sit to/from stand               Vision Baseline Vision/History: 1 Wears glasses Ability to See in Adequate Light: 0 Adequate Patient Visual Report: No change from baseline  Pertinent Vitals/Pain Pain Assessment Pain Assessment: Faces Faces Pain Scale: Hurts even more Pain Location: right knee and bottom of both feet Pain Descriptors / Indicators: Aching, Sore Pain Intervention(s): Limited  activity within patient's tolerance, Monitored during session, Repositioned     Hand Dominance Left   Extremity/Trunk Assessment Upper Extremity Assessment Upper Extremity Assessment: Overall WFL for tasks assessed           Communication Communication Communication: No difficulties   Cognition Arousal/Alertness: Awake/alert Behavior During Therapy: WFL for tasks assessed/performed Overall Cognitive Status: Within Functional Limits for tasks assessed                                                  Home Living Family/patient expects to be discharged to:: Private residence Living Arrangements: Alone Available Help at Discharge: Family;Friend(s);Available PRN/intermittently Type of Home: House Home Access: Stairs to enter [employer] of Steps: 1 onto stoop and one into house Entrance Stairs-Rails: None Home Layout: One level     Bathroom Shower/Tub: Tub/shower unit;Curtain   Firefighter: Standard     Home Equipment: Cane - single point;Toilet riser;BSC/3in1          Prior Functioning/Environment Prior Level of Function : Independent/Modified Independent;Driving                        OT Problem List: Decreased strength;Impaired balance (sitting and/or standing);Pain      OT Treatment/Interventions: Self-care/ADL training;DME and/or AE instruction;Patient/family education;Balance training    OT Goals(Current goals can be found in the care plan section) Acute Rehab OT Goals Patient Stated Goal: to go to rehab if can get someone to take care of her cat for her OT Goal Formulation: With patient Time For Goal Achievement: 03/12/22 Potential to Achieve Goals: Good  OT Frequency: Min 2X/week       AM-PAC OT "6 Clicks" Daily Activity     Outcome Measure Help from another person eating meals?: None Help from another person taking care of personal grooming?: A Little Help from another person toileting, which includes  using toliet, bedpan, or urinal?: A Little Help from another person bathing (including washing, rinsing, drying)?: A Little Help from another person to put on and taking off regular upper body clothing?: A Little Help from another person to put on and taking off regular lower body clothing?: A Little 6 Click Score: 19   End of Session Equipment Utilized During Treatment: Gait belt;Rolling walker (2 wheels) Nurse Communication: Mobility status  Activity Tolerance: Patient tolerated treatment well Patient left: in chair;with call bell/phone within reach;with chair alarm set  OT Visit Diagnosis: Unsteadiness on feet (R26.81);Other abnormalities of gait and mobility (R26.89);Muscle weakness (generalized) (M62.81);History of falling (Z91.81);Pain Pain - Right/Left: Left Pain - part of body: Knee (and bottoms of Bil feet)                Time: 3220-2542 OT Time Calculation (min): 32 min Charges:  OT General Charges $OT Visit: 1 Visit OT Evaluation $OT Eval Moderate Complexity: 1 Mod OT Treatments $Self Care/Home Management : 8-22 mins  Ignacia Palma, OTR/L Acute Rehab Services [facility] [phone] Office [phone]    Evette Georges 02/26/2022, 9:15 AM

## 2022-02-26 NOTE — TOC Initial Note (Signed)
Transition of Care [facility] - Initial/Assessment Note    Patient Details  Name: Kristina [name] MRN: [id number] Date of Birth: [birth year]  [facility]) CM/SW Contact:    Lanier Clam, RN Phone Number: 02/26/2022, 10:50 AM  Clinical Narrative: Spoke to son Kristina [name]-d/c plan home. Lives alone, has 3n1,cane,still drives to Dr appts, has pcp(doesn't know name). OT -ST SNF, will await PT recc.                  Expected Discharge Plan: Home w Home Health Services Barriers to Discharge: Continued Medical Work up   Patient Goals and CMS Choice Patient states their goals for this hospitalization and ongoing recovery are:: Home CMS Medicare.gov Compare Post Acute Care list provided to:: Patient Represenative (must comment) (Kristina [name](son) [phone]) Choice offered to / list presented to : Adult Children  Expected Discharge Plan and Services Expected Discharge Plan: Home w Home Health Services   Discharge Planning Services: CM Consult Post Acute Care Choice: Home Health Living arrangements for the past 2 months: Single Family Home                                      Prior Living Arrangements/Services Living arrangements for the past 2 months: Single Family Home Lives with:: Self Patient language and need for interpreter reviewed:: Yes Do you feel safe going back to the place where you live?: Yes      Need for Family Participation in Patient Care: Yes (Comment) Care giver support system in place?: Yes (comment) Current home services: DME (3n1,cane) Criminal Activity/Legal Involvement Pertinent to Current Situation/Hospitalization: No - Comment as needed  Activities of Daily Living Home Assistive Devices/Equipment: Other (Comment) (pt having trouble answering questions) ADL Screening (condition at time of admission) Patient's cognitive ability adequate to safely complete daily activities?: No Is the patient deaf or have difficulty hearing?: No Does the patient have  difficulty seeing, even when wearing glasses/contacts?: No Does the patient have difficulty concentrating, remembering, or making decisions?: Yes Patient able to express need for assistance with ADLs?: No Does the patient have difficulty dressing or bathing?: Yes Independently performs ADLs?: Yes (appropriate for developmental age) Does the patient have difficulty walking or climbing stairs?: Yes Weakness of Legs: Both Weakness of Arms/Hands: Both  Permission Sought/Granted Permission sought to share information with : Case Manager Permission granted to share information with : Yes, Verbal Permission Granted  Share Information with NAME: Case Manager           Emotional Assessment Appearance:: Appears stated age Attitude/Demeanor/Rapport: Gracious Affect (typically observed): Accepting Orientation: : Oriented to Self, Oriented to Place, Oriented to  Time, Oriented to Situation Alcohol / Substance Use: Not Applicable Psych Involvement: No (comment)  Admission diagnosis:  Shortness of breath [R06.02] HTN (hypertension), malignant [I10] Patient Active Problem List   Diagnosis Date Noted   Headache 02/23/2022   Chronic diastolic CHF (congestive heart failure) (HCC) 02/23/2022   HTN (hypertension), malignant 02/22/2022   GERD (gastroesophageal reflux disease)    Chest pain    Hypercholesteremia 10/15/2018   GIB (gastrointestinal bleeding) 10/15/2018   Colitis 10/15/2018   Hypokalemia    Thyroid disease    Acute pyelonephritis 03/22/2018   Pyelonephritis    UTI (urinary tract infection) 03/21/2018   IBS (irritable bowel syndrome) 03/21/2018   Morbid obesity (HCC) 08/08/2015   Hyponatremia 05/01/2015   Gastroenteritis, acute 05/01/2015   Diabetes  mellitus type 2, controlled (HCC) 05/01/2015   Asthma in remission 05/01/2015   Diarrhea    Cough variant asthma 04/19/2015   Acute bronchitis 03/08/2015   Hypothyroidism, adult 02/19/2015   Dyspnea 02/18/2015   Upper airway  cough syndrome 01/07/2015   Pre-syncope 12/08/2013   PVC (premature ventricular contraction) 08/26/2013   Rosacea    Anxiety    Diverticulitis    Diabetes mellitus without complication (HCC)    HTN (hypertension)    Obesity    PCP:  Pcp, No Pharmacy:   [id number] [place], [place] - [address] [place] [address] Phone: [phone] Fax: [phone]  [facility] [address] [place] [address] Phone: [phone] Fax: [phone]     Social Determinants of Health (SDOH) Interventions    Readmission Risk Interventions     No data to display

## 2022-02-26 NOTE — Plan of Care (Signed)

## 2022-02-27 ENCOUNTER — Inpatient Hospital Stay (HOSPITAL_COMMUNITY): Payer: Medicare PPO

## 2022-02-27 DIAGNOSIS — I10 Essential (primary) hypertension: Secondary | ICD-10-CM | POA: Diagnosis not present

## 2022-02-27 LAB — GLUCOSE, CAPILLARY
Glucose-Capillary: 258 mg/dL — ABNORMAL HIGH (ref 70–99)
Glucose-Capillary: 194 mg/dL — ABNORMAL HIGH (ref 70–99)
Glucose-Capillary: 279 mg/dL — ABNORMAL HIGH (ref 70–99)
Glucose-Capillary: 253 mg/dL — ABNORMAL HIGH (ref 70–99)

## 2022-02-27 LAB — BASIC METABOLIC PANEL
Anion gap: 8 (ref 5–15)
BUN: 16 mg/dL (ref 8–23)
CO2: 26 mmol/L (ref 22–32)
Calcium: 8.9 mg/dL (ref 8.9–10.3)
Chloride: 99 mmol/L (ref 98–111)
Creatinine, Ser: 0.7 mg/dL (ref 0.44–1.00)
GFR, Estimated: >60 mL/min (ref >60)
Glucose, Bld: 247 mg/dL — ABNORMAL HIGH (ref 70–99)
Potassium: 3.9 mmol/L (ref 3.5–5.1)
Sodium: 133 mmol/L — ABNORMAL LOW (ref 135–145)

## 2022-02-27 MED ORDER — PANTOPRAZOLE 2 MG/ML SUSPENSION
40.0000 mg | Freq: Two times a day (BID) | ORAL | Status: DC
  Administered 2022-02-27 (×2): 40 mg via ORAL
  Filled 2022-02-27 (×3): qty 20

## 2022-02-27 MED ORDER — INSULIN GLARGINE-YFGN 100 UNIT/ML ~~LOC~~ SOLN
6.0000 [IU] | Freq: Every day | SUBCUTANEOUS | Status: DC
  Administered 2022-02-27 – 2022-02-28 (×2): 6 [IU] via SUBCUTANEOUS
  Filled 2022-02-27 (×2): qty 0.06

## 2022-02-27 NOTE — Care Management Important Message (Signed)
Important Message  Patient Details IM Letter given to the Patient. Name: Kristina [name] MRN: [id number] Date of Birth: 1945/10/11   Medicare Important Message Given:  Yes     Caren Macadam 02/27/2022, 10:52 AM

## 2022-02-27 NOTE — Evaluation (Signed)
Physical Therapy Evaluation Patient Details Name: Kristina [name] MRN: [id number] DOB: November 12, 1945 Today's Date: 02/27/2022  History of Present Illness  Kristina [name] is a 76 y.o. female presented complaining of shortness of breath, chest pain at rest, difficulty finding words since this morning, she is also feeling confused,occipital headache. Admitted with malignant hypertension. CT/MRI: negative for acute event. PHMx: labile hypertension, hyperlipidemia, diabetes type, hypothyroidism, chronic diastolic heart failure, anxiety.    Clinical Impression  Kristina [name] is 76 y.o. female admitted with above HPI and diagnosis. Patient is currently limited by functional impairments below (see PT problem list). Patient lives alone and is mod independent with SPC at baseline. Currently she requires min guard/supervision for safety with transfers and gait with SPC. Patient will benefit from continued skilled PT interventions to address impairments and progress independence with mobility, recommending HHPT with supervision/assist from family. Spoke to pt's son who reports pt's sister may travel into town to assist pt for 1-2 wks. Acute PT will follow and progress as able.        Recommendations for follow up therapy are one component of a multi-disciplinary discharge planning process, led by the attending physician.  Recommendations may be updated based on patient status, additional functional criteria and insurance authorization.  Follow Up Recommendations Home health PT (pending family support vs ST [facility])      Assistance Recommended at Discharge Frequent or constant Supervision/Assistance  Patient can return home with the following  A little help with walking and/or transfers;A little help with bathing/dressing/bathroom;Assistance with cooking/housework;Direct supervision/assist for medications management;Help with stairs or ramp for entrance;Assist for transportation    Equipment  Recommendations None recommended by PT  Recommendations for Other Services       Functional Status Assessment Patient has had a recent decline in their functional status and demonstrates the ability to make significant improvements in function in a reasonable and predictable amount of time.     Precautions / Restrictions Precautions Precautions: Fall Precaution Comments: reports she fell about 2 months ago Restrictions Weight Bearing Restrictions: No      Mobility  Bed Mobility Overal bed mobility: Needs Assistance Bed Mobility: Supine to Sit     Supine to sit: Supervision, HOB elevated     General bed mobility comments: supervision for safety, no cues or assist    Transfers Overall transfer level: Needs assistance Equipment used: Rolling walker (2 wheels) Transfers: Sit to/from Stand Sit to Stand: Min guard, Supervision           General transfer comment: close guard on first rise and supervision for last 2 sit<>stands from EOB and recliner.    Ambulation/Gait Ambulation/Gait assistance: Min guard Gait Distance (Feet): 180 Feet Assistive device: Straight cane Gait Pattern/deviations: Step-through pattern, Decreased stride length, Trunk flexed Gait velocity: decr     General Gait Details: pt using SPC in Lt UE and demonstrated safe management and positioning of cane with gait. no overt LOB, slow cautious pace.  Stairs            Wheelchair Mobility    Modified Rankin (Stroke Patients Only)       Balance Overall balance assessment: Needs assistance Sitting-balance support: No upper extremity supported, Feet supported Sitting balance-Leahy Scale: Good     Standing balance support: Bilateral upper extremity supported, Single extremity supported, During functional activity, Reliant on assistive device for balance Standing balance-Leahy Scale: Fair Standing balance comment: pt reliant on [facility] for mobility and reaching out for support of  furniture/walls in room if no AD available                             Pertinent Vitals/Pain Pain Assessment Pain Assessment: No/denies pain    Home Living Family/patient expects to be discharged to:: Private residence Living Arrangements: Alone Available Help at Discharge: Family;Friend(s);Available PRN/intermittently Type of Home: House Home Access: Stairs to enter Entrance Stairs-Rails: None Entrance Stairs-Number of Steps: 1 onto stoop and one into house   Home Layout: One level Home Equipment: Cane - single point;Toilet riser;BSC/3in1      Prior Function Prior Level of Function : Independent/Modified Independent;Driving                     Hand Dominance   Dominant Hand: Left    Extremity/Trunk Assessment   Upper Extremity Assessment Upper Extremity Assessment: Overall WFL for tasks assessed;Defer to OT evaluation    Lower Extremity Assessment Lower Extremity Assessment: Overall WFL for tasks assessed (5x sit<>stand:)    Cervical / Trunk Assessment Cervical / Trunk Assessment: Normal  Communication   Communication: No difficulties  Cognition Arousal/Alertness: Awake/alert Behavior During Therapy: WFL for tasks assessed/performed Overall Cognitive Status: Within Functional Limits for tasks assessed                                          General Comments      Exercises     Assessment/Plan    PT Assessment Patient needs continued PT services  PT Problem List Decreased strength;Decreased activity tolerance;Decreased balance;Decreased mobility;Decreased knowledge of use of DME;Decreased knowledge of precautions       PT Treatment Interventions DME instruction;Gait training;Stair training;Functional mobility training;Therapeutic activities;Therapeutic exercise;Balance training;Neuromuscular re-education;Patient/family education    PT Goals (Current goals can be found in the Care Plan section)  Acute Rehab PT  Goals Patient Stated Goal: get back to her cat PT Goal Formulation: With patient Time For Goal Achievement: 03/13/22 Potential to Achieve Goals: Good    Frequency Min 3X/week     Co-evaluation               AM-PAC PT "6 Clicks" Mobility  Outcome Measure Help needed turning from your back to your side while in a flat bed without using bedrails?: A Little Help needed moving from lying on your back to sitting on the side of a flat bed without using bedrails?: A Little Help needed moving to and from a bed to a chair (including a wheelchair)?: A Little Help needed standing up from a chair using your arms (e.g., wheelchair or bedside chair)?: A Little Help needed to walk in hospital room?: A Little Help needed climbing 3-5 steps with a railing? : A Lot 6 Click Score: 17    End of Session Equipment Utilized During Treatment: Gait belt Activity Tolerance: Patient tolerated treatment well Patient left: in chair;with call bell/phone within reach;with chair alarm set Nurse Communication: Mobility status PT Visit Diagnosis: Difficulty in walking, not elsewhere classified (R26.2);Other abnormalities of gait and mobility (R26.89)    Time: 9983-3825 PT Time Calculation (min) (ACUTE ONLY): 36 min   Charges:   PT Evaluation $PT Eval Low Complexity: 1 Low PT Treatments $Gait Training: 8-22 mins        Wynn Maudlin, DPT Acute Rehabilitation Services Office [phone] Pager [phone]  02/27/22 10:39 AM

## 2022-02-27 NOTE — Progress Notes (Addendum)
PROGRESS NOTE    ERCEL NORMOYLE  HQI:[id number] DOB: 1946/04/22 DOA: 02/22/2022 PCP: Oneita Hurt, No   Brief Narrative: Kristina [name] is a 76 y.o. female past medical history significant for labile hypertension, hyperlipidemia, diabetes type 2 , hypothyroidism, chronic diastolic heart failure, prior cardiac CT 2019 Calcium  score 122, 68 percentile Presents complaining of shortness of breath and chest pain at rest when she wake up this morning.  She reports chest pain and shortness of breath for the last month especially on exertion.  She relates that the chest pain and tightness has been progressively getting worse over the last  weeks.  This morning she developed worsening chest pain and dyspnea at rest.   She also noticed difficulty finding words since this morning, she is also feeling confused.  Her son noticed that she is foggy and having difficulty expressing herself.  She had some difficulty trying to figure out how to make a phone call from her phone, which she normally is be able to do.  Patient admitted with malignant hypertension. Develops severe hyponatremia. Treated with hypertonic saline for 24 hours. Received Tovalptan. Now on lasix.    Assessment & Plan:   Principal Problem:   HTN (hypertension), malignant Active Problems:   Diabetes mellitus without complication (HCC)   HTN (hypertension)   Hyponatremia   Thyroid disease   Hypercholesteremia   GERD (gastroesophageal reflux disease)   Chest pain   Headache   Chronic diastolic CHF (congestive heart failure) (HCC)  1-Malignant HTN;  Patient presents with a systolic blood pressure 207/105, shortness of breath, chest pain, confusion, difficulty finding words. - IV hydralazine and IV labetalol PRN -Discontinue HCTZ due to hyponatremia.  -Discontinue Norvasc, SBP drop to 100, 90.  Her BP is labile. She was hypotensive 7/29.  Discontinue nevibolol.  Order parameter for Avapro/  She will need PRN medication prescription for  home to be use for SBP more than 150.    2-Chest pain, shortness of breath: Could be related to high blood pressure. Troponin x2 negative PRN nitroglycerine Continue Zocor. Started baby aspirin.  Chest x ray negative. Mildly elevated BNP. Chest pain and dyspnea improved.  Cardiology recommendation: Because chest pain now related when she drinks water, no plan for Coronary CT inpatient.  Reported episode of Dyspnea today, Chest x ray negative.   3-Headaches, Acute metabolic encephalopathy, Attention Deficit.  Suspect related to HTN/. And hyponatremia CT head: negative. ABG no hypercapnia.  MRI/MRA brain. Negative Tramadol PRN ordered.  TSH; 2.7. cortisol normal.  Neurology consulted, attention deficit likely related to hypertension and hyponatremia.  If her mental status does not improve as would  be expected, neurology can be available to reassess.  Improved.   4-Hypothyroidism; Continue with Synthroid.  5-DM type; SSI.  CBG increasing, start low dose Semglee.  Hold home metformin.   6-Chronic diastolic HF; does not appears to be volume overload.  Chest x ray negative. Mildly elevated BNP.   7-Hyponatremia; Severe  Urine osmolality consistent with SIADH. Exacerbated In setting of HCTZ and vomiting.  Treated with hypertonic saline 3 %.  Sodium increase to 128.  Received Tolvaptan 7/29. Continue to monitor sodium level.  Improved.  Continue with lasix. Stop sodium tablet.  Monitor sodium level.   Dysphagia;  Report chest pain while drinking water today. Felt water got stuck.  Continue with  IV protonix.  Esophagogram. Mild esophageal dysmotility  Small frequent meals.   Pyuria; UA with 21-50 WBC.  Urine no growth.  Discontinue antibiotics.  Estimated body mass index is 24.41 kg/m as calculated from the following:   Height as of this encounter: 5\' 4"  (1.626 m).   Weight as of this encounter: 64.5 kg.   DVT prophylaxis: Lovenox Code Status: Full code Family  Communication: Son at bedside. 7/30 Disposition Plan:  Status is: Observation The patient will require care spanning > 2 midnights and should be moved to inpatient because: Home tomorrow     Consultants:  Cardiology Neurology   Procedures:  ECHO  Antimicrobials:    Subjective: Her MS has improved.  She was feeling weak this morning, no energy. She had an episode of dyspnea. She is feeling better.  Objective: Vitals:   02/27/22 0713 02/27/22 0853 02/27/22 1314 02/27/22 1317  BP: 133/74 133/74 (!) 107/35 (!) 140/53  Pulse: 72 88 80 81  Resp:      Temp:   98.5 F (36.9 C)   TempSrc:   Oral   SpO2:   98% 98%  Weight:      Height:        Intake/Output Summary (Last 24 hours) at 02/27/2022 1651 Last data filed at 02/27/2022 0900 Gross per 24 hour  Intake 240 ml  Output 150 ml  Net 90 ml    Filed Weights   02/24/22 0500 02/25/22 0500 02/26/22 0500  Weight: 66.4 kg 64.2 kg 64.5 kg    Examination:  General exam: NAD Respiratory system: CTA Cardiovascular system: S 1, S 2 RRR Gastrointestinal system: BS present, soft,  Central nervous system: Non focal.  Extremities: No edema   Data Reviewed: I have personally reviewed following labs and imaging studies  CBC: Recent Labs  Lab 02/22/22 0851 02/23/22 0313  WBC 5.0 8.2  NEUTROABS 4.0  --   HGB 13.3 14.2  HCT 37.1 38.8  MCV 92.8 90.0  PLT 200 243    Basic Metabolic Panel: Recent Labs  Lab 02/24/22 0227 02/24/22 0710 02/25/22 0054 02/25/22 0806 02/25/22 1707 02/26/22 0318 02/27/22 0755  NA 125*   < > 126* 128* 135 134* 133*  K 3.4*  --  3.4* 3.4*  --  4.5 3.9  CL 90*  --  93* 94*  --  102 99  CO2 23  --  23 24  --  23 26  GLUCOSE 160*  --  126* 145*  --  155* 247*  BUN 14  --  22 20  --  20 16  CREATININE 0.60  --  0.79 0.82  --  0.74 0.70  CALCIUM 9.0  --  8.7* 9.0  --  9.0 8.9   < > = values in this interval not displayed.    GFR: Estimated Creatinine Clearance: 50.9 mL/min (by C-G formula  based on SCr of 0.7 mg/dL). Liver Function Tests: Recent Labs  Lab 02/22/22 0851 02/23/22 0313  AST 19 22  ALT 12 14  ALKPHOS 52 59  BILITOT 0.9 1.1  PROT 6.8 7.2  ALBUMIN 3.9 4.2    No results for input(s): "LIPASE", "AMYLASE" in the last 168 hours. No results for input(s): "AMMONIA" in the last 168 hours. Coagulation Profile: No results for input(s): "INR", "PROTIME" in the last 168 hours. Cardiac Enzymes: No results for input(s): "CKTOTAL", "CKMB", "CKMBINDEX", "TROPONINI" in the last 168 hours. BNP (last 3 results) No results for input(s): "PROBNP" in the last 8760 hours. HbA1C: No results for input(s): "HGBA1C" in the last 72 hours.  CBG: Recent Labs  Lab 02/26/22 1712 02/26/22 2148 02/27/22 0723 02/27/22 1130 02/27/22  1629  GLUCAP 297* 203* 258* 194* 279*    Lipid Profile: No results for input(s): "CHOL", "HDL", "LDLCALC", "TRIG", "CHOLHDL", "LDLDIRECT" in the last 72 hours. Thyroid Function Tests: No results for input(s): "TSH", "T4TOTAL", "FREET4", "T3FREE", "THYROIDAB" in the last 72 hours.  Anemia Panel: No results for input(s): "VITAMINB12", "FOLATE", "FERRITIN", "TIBC", "IRON", "RETICCTPCT" in the last 72 hours.  Sepsis Labs: No results for input(s): "PROCALCITON", "LATICACIDVEN" in the last 168 hours.  Recent Results (from the past 240 hour(s))  MRSA Next Gen by PCR, Nasal     Status: None   Collection Time: 02/22/22  7:39 PM   Specimen: Nasal Mucosa; Nasal Swab  Result Value Ref Range Status   MRSA by PCR Next Gen NOT DETECTED NOT DETECTED Final    Comment: (NOTE) The GeneXpert MRSA Assay (FDA approved for NASAL specimens only), is one component of a comprehensive MRSA colonization surveillance program. It is not intended to diagnose MRSA infection nor to guide or monitor treatment for MRSA infections. Test performance is not FDA approved in patients less than 4 years old. Performed at [facility], [address], [place], [place] [address]   Urine Culture     Status: None   Collection Time: 02/23/22  5:46 PM   Specimen: Urine, Catheterized  Result Value Ref Range Status   Specimen Description   Final    URINE, CATHETERIZED Performed at [facility], [address] [place], [place] [address]    Special Requests   Final    NONE Performed at [facility], [address] [place], [place] [address]    Culture   Final    NO GROWTH Performed at [facility], [address] [place], [place] [address]    Report Status 02/25/2022 FINAL  Final         Radiology Studies: DG Chest 2 View  Result Date: 02/27/2022 CLINICAL DATA:  Dyspnea EXAM: CHEST - 2 VIEW COMPARISON:  02/22/2022 chest radiograph. FINDINGS: Cholecystectomy clips are seen in the right upper quadrant of the abdomen. Retained oral contrast throughout the visualized colon in the upper abdomen. Stable cardiomediastinal silhouette with normal heart size. No pneumothorax. No pleural effusion. Lungs appear clear, with no acute consolidative airspace disease and no pulmonary edema. IMPRESSION: No active cardiopulmonary disease. Electronically Signed   By: Delbert Phenix M.D.   On: 02/27/2022 10:35   DG ESOPHAGUS W SINGLE CM (SOL OR THIN BA)  Result Date: 02/26/2022 CLINICAL DATA:  Difficulty swallowing. Sensation of food becoming stuck in mid chest. EXAM: ESOPHAGUS/BARIUM SWALLOW/TABLET STUDY TECHNIQUE: Single contrast examination was performed using thin liquid barium. FLUOROSCOPY: Radiation Exposure Index (as provided by the fluoroscopic device): 18.9 mGy Kerma COMPARISON:  None available FINDINGS: Swallowing: Appears normal. No vestibular penetration or aspiration seen. Pharynx: Unremarkable. Esophagus: Normal appearance.  No fixed stenosis. Esophageal motility: Mild dysmotility Hiatal Hernia: None. Gastroesophageal reflux: None visualized. Ingested 13 mm barium tablet: Passed without significant  delay. Other: None. IMPRESSION: 1. Mild diffuse esophageal dysmotility. 2. No significant esophageal stenosis or stricture. Electronically Signed   By: Acquanetta Belling M.D.   On: 02/26/2022 11:47        Scheduled Meds:  aspirin EC  81 mg Oral Daily   budesonide  3 mg Oral Daily   Chlorhexidine Gluconate Cloth  6 each Topical Daily   cyanocobalamin  1,000 mcg Oral Daily   enoxaparin (LOVENOX) injection  40 mg Subcutaneous Q24H   feeding supplement  237  mL Oral BID BM   furosemide  10 mg Oral BID   insulin aspart  0-9 Units Subcutaneous TID WC   insulin glargine-yfgn  6 Units Subcutaneous Daily   irbesartan  150 mg Oral Daily   levothyroxine  50 mcg Oral q morning   multivitamin with minerals  1 tablet Oral Daily   pantoprazole sodium  40 mg Oral BID   simvastatin  20 mg Oral QPM   sodium chloride flush  3 mL Intravenous Q12H   Continuous Infusions:  sodium chloride 10 mL/hr at 02/26/22 0611     LOS: 4 days    Time spent: 35 minutes    Marypat Kimmet A Tracey Stewart, MD [facility]   If 7PM-7AM, please contact night-coverage [email or web address]  02/27/2022, 4:51 PM

## 2022-02-28 ENCOUNTER — Other Ambulatory Visit (HOSPITAL_COMMUNITY): Payer: Self-pay

## 2022-02-28 DIAGNOSIS — E7800 Pure hypercholesterolemia, unspecified: Secondary | ICD-10-CM

## 2022-02-28 DIAGNOSIS — E119 Type 2 diabetes mellitus without complications: Secondary | ICD-10-CM

## 2022-02-28 DIAGNOSIS — E871 Hypo-osmolality and hyponatremia: Secondary | ICD-10-CM

## 2022-02-28 DIAGNOSIS — I10 Essential (primary) hypertension: Secondary | ICD-10-CM | POA: Diagnosis not present

## 2022-02-28 DIAGNOSIS — E079 Disorder of thyroid, unspecified: Secondary | ICD-10-CM

## 2022-02-28 DIAGNOSIS — I5032 Chronic diastolic (congestive) heart failure: Secondary | ICD-10-CM | POA: Diagnosis not present

## 2022-02-28 LAB — BASIC METABOLIC PANEL
Anion gap: 8 (ref 5–15)
BUN: 16 mg/dL (ref 8–23)
CO2: 29 mmol/L (ref 22–32)
Calcium: 8.9 mg/dL (ref 8.9–10.3)
Chloride: 99 mmol/L (ref 98–111)
Creatinine, Ser: 0.7 mg/dL (ref 0.44–1.00)
GFR, Estimated: >60 mL/min (ref >60)
Glucose, Bld: 185 mg/dL — ABNORMAL HIGH (ref 70–99)
Potassium: 4 mmol/L (ref 3.5–5.1)
Sodium: 136 mmol/L (ref 135–145)

## 2022-02-28 LAB — CBC
HCT: 32.9 % — ABNORMAL LOW (ref 36.0–46.0)
Hemoglobin: 11.4 g/dL — ABNORMAL LOW (ref 12.0–15.0)
MCH: 33.6 pg (ref 26.0–34.0)
MCHC: 34.7 g/dL (ref 30.0–36.0)
MCV: 97.1 fL (ref 80.0–100.0)
Platelets: 200 10*3/uL (ref 150–400)
RBC: 3.39 MIL/uL — ABNORMAL LOW (ref 3.87–5.11)
RDW: 12.7 % (ref 11.5–15.5)
WBC: 5.2 10*3/uL (ref 4.0–10.5)
nRBC: 0 % (ref 0.0–0.2)

## 2022-02-28 LAB — GLUCOSE, CAPILLARY
Glucose-Capillary: 248 mg/dL — ABNORMAL HIGH (ref 70–99)
Glucose-Capillary: 266 mg/dL — ABNORMAL HIGH (ref 70–99)

## 2022-02-28 MED ORDER — FUROSEMIDE 20 MG PO TABS
20.0000 mg | ORAL_TABLET | Freq: Every day | ORAL | 1 refills | Status: DC
  Filled 2022-02-28: qty 90, 90d supply, fill #0

## 2022-02-28 MED ORDER — FUROSEMIDE 20 MG PO TABS: 20.0000 mg | ORAL_TABLET | Freq: Every day | ORAL | 1 refills | Status: DC

## 2022-02-28 MED ORDER — NEBIVOLOL HCL 10 MG PO TABS
10.0000 mg | ORAL_TABLET | Freq: Every day | ORAL | 3 refills | Status: DC
  Filled 2022-02-28: qty 30, 30d supply, fill #0

## 2022-02-28 MED ORDER — VALSARTAN 160 MG PO TABS
160.0000 mg | ORAL_TABLET | Freq: Every day | ORAL | 11 refills | Status: DC
  Filled 2022-02-28: qty 30, 30d supply, fill #0

## 2022-02-28 MED ORDER — VALSARTAN 160 MG PO TABS: 160.0000 mg | ORAL_TABLET | Freq: Every day | ORAL | 11 refills | Status: AC

## 2022-02-28 MED ORDER — NEBIVOLOL HCL 10 MG PO TABS: 10.0000 mg | ORAL_TABLET | Freq: Every day | ORAL | 3 refills | Status: DC

## 2022-02-28 NOTE — Discharge Summary (Addendum)
Physician Discharge Summary   Patient: Kristina [name] MRN: [id number] DOB: 1945/09/24  Admit date:     02/22/2022  Discharge date: 02/28/22  Discharge Physician: Meredeth Ide   PCP: Pcp, No   Recommendations at discharge:   Follow-up PCP in 2 weeks  Discharge Diagnoses: Principal Problem:   HTN (hypertension), malignant Active Problems:   Diabetes mellitus without complication (HCC)   HTN (hypertension)   Hyponatremia   Thyroid disease   Hypercholesteremia   GERD (gastroesophageal reflux disease)   Chest pain   Headache   Chronic diastolic CHF (congestive heart failure) (HCC) Hypertensive encephalopathy    Hospital Course: 76 y.o. female past medical history significant for labile hypertension, hyperlipidemia, diabetes type 2 , hypothyroidism, chronic diastolic heart failure, prior cardiac CT 2019 Calcium  score 122, 68 percentile. Presented with  shortness of breath and chest pain at rest when she wake up this morning.  She reports chest pain and shortness of breath for the last month especially on exertion.  She relates that the chest pain and tightness has been progressively getting worse over the last  weeks.  This morning she developed worsening chest pain and dyspnea at rest.    She also noticed difficulty finding words since this morning, she is also feeling confused.  Her son noticed that she is foggy and having difficulty expressing herself.  She had some difficulty trying to figure out how to make a phone call from her phone, which she normally is be able to do.   Patient admitted with malignant hypertension, hyponatremia.  Blood pressure improved.  Cardiology was consulted for dyspnea on exertion.  No intervention recommended in the hospital.  Patient's encephalopathy improved.  Likely due to hypertensive encephalopathy.    Assessment and Plan:  Malignant hypertension -Presented with systolic blood pressure of 207/105, shortness of breath, chest pain, confusion,  difficulty finding words.  -Started on IV labetalol and IV hydralazine as needed -At this time malignant hypertension has resolved  Hypertension -Patient was taking Diovan HCTZ 160/12.5 at home, nebivolol 20 mg daily at home -She has been started on Lasix 20 mg daily in the hospital -We will discontinue HCTZ, continue with Diovan 160 mg daily -Due to soft blood pressure in the hospital, nebivolol was discontinued, I will restart nebivolol at low-dose of 10 mg daily -Discharge medications include Diovan 160 mg daily, nebivolol 10 mg daily, Lasix 20 mg daily  Hypertensive encephalopathy -Due to malignant hypertension -Significantly improved -Neurology was consulted felt it was likely from hypotension and hyponatremia -Patient will benefit from dementia evaluation as outpatient -Discussed with patient in detail  Dyspnea on exertion/?  Acute on chronic diastolic heart failure -Likely hypertensive induced -Significantly improved -Troponin x2 negative -Echocardiogram showed grade 1 diastolic dysfunction, EF 55% -Cardiology was consulted, recommend coronary CTA was initially ordered however it was then canceled -Plan for PET/CT stress as outpatient -Follow-up cardiology as outpatient -Patient has been started on Lasix 20 mg daily, will continue with that  Hyponatremia -Likely from vomiting and HCTZ -Resolved -HCTZ will be discontinued  Hypothyroidism -Continue Synthroid  Diabetes mellitus type 2 -Hemoglobin A1c 6.6 -Continue metformin at home  Dysphagia -Esophagram obtained showed 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 -Recommend frequent small meals  Pyuria -UA with 21-50 WBCs per high-power field -Urine culture showed no growth -Antibiotics have been discontinued        Consultants: Cardiology Procedures performed:  Disposition: Home Diet recommendation:  Discharge Diet Orders (From admission, onward)     Start  Ordered   02/28/22 0000   Diet - low sodium heart healthy        02/28/22 1058           Cardiac diet DISCHARGE MEDICATION: Allergies as of 02/28/2022       Reactions   Ace Inhibitors Cough   Ciprofloxacin Other (See Comments)   Tendinitis in heel and posterior right LE Tendon rupture   Doxycycline Other (See Comments)   Pt does not remember what the reaction was - many years ago   Shrimp [shellfish Allergy] Diarrhea, Nausea And Vomiting, Rash        Medication List     STOP taking these medications    valsartan-hydrochlorothiazide 160-12.5 MG tablet Commonly known as: DIOVAN-HCT       TAKE these medications    acetaminophen 500 MG tablet Commonly known as: TYLENOL Take 1,000 mg by mouth 2 (two) times daily as needed (pain).   ALIGN PO Take 1 tablet by mouth daily.   budesonide 3 MG 24 hr capsule Commonly known as: ENTOCORT EC Take 3 mg by mouth daily.   CALCIUM PO Take 500 mg by mouth at bedtime.   cyanocobalamin 1000 MCG tablet Commonly known as: VITAMIN B12 Take 1,000 mcg by mouth daily.   desonide 0.05 % cream Commonly known as: DESOWEN Apply 1 Application topically daily as needed for rash.   furosemide 20 MG tablet Commonly known as: LASIX Take 1 tablet (20 mg total) by mouth daily.   hyoscyamine 0.125 MG tablet Commonly known as: LEVSIN Take 0.125 mg by mouth 2 (two) times daily as needed for cramping.   levothyroxine 50 MCG tablet Commonly known as: SYNTHROID Take 50 mcg by mouth every morning.   mesalamine 0.375 g 24 hr capsule Commonly known as: APRISO Take 4 capsules by mouth daily.   metFORMIN 500 MG 24 hr tablet Commonly known as: GLUCOPHAGE-XR Take 3 tablets (1,500 mg total) by mouth daily.   nebivolol 10 MG tablet Commonly known as: BYSTOLIC Take 1 tablet (10 mg total) by mouth daily. What changed:  medication strength how much to take   simethicone 125 MG chewable tablet Commonly known as: MYLICON Chew 125 mg by mouth every 6 (six) hours as  needed for flatulence.   simvastatin 20 MG tablet Commonly known as: ZOCOR Take 20 mg by mouth every evening.   tizanidine 2 MG capsule Commonly known as: ZANAFLEX Take 1-2 capsules (2-4 mg total) by mouth 3 (three) times daily. What changed:  when to take this reasons to take this   valsartan 160 MG tablet Commonly known as: Diovan Take 1 tablet (160 mg total) by mouth daily.        Follow-up Information     [facility]-EMERGENCY DEPT .   Specialty: Emergency Medicine Contact information: [address] [place] [id number] [place] [address] [phone]        [name], MD. Schedule an appointment as soon as possible for a visit in 2 week(s).   Specialty: Cardiology Contact information: [address] [place] [address] [phone]         [facility], [facility] Follow up.   Specialty: Home Health Services Why: [facility] physical/occupational therapy/aide Contact information: [address] [place] [address] [phone]                Discharge Exam: Filed Weights   02/24/22 0500 02/25/22 0500 02/26/22 0500  Weight: 66.4 kg 64.2 kg 64.5  kg   General-appears in no acute distress Heart-S1-S2, regular, no murmur auscultated Lungs-clear to auscultation bilaterally, no wheezing or crackles auscultated Abdomen-soft, nontender, no organomegaly Extremities-no edema in the lower extremities Neuro-alert, oriented x3, no focal deficit noted  Condition at discharge: good  The results of significant diagnostics from this hospitalization (including imaging, microbiology, ancillary and laboratory) are listed below for reference.   Imaging Studies: DG Chest 2 View  Result Date: 02/27/2022 CLINICAL DATA:  Dyspnea EXAM: CHEST - 2 VIEW COMPARISON:  02/22/2022 chest radiograph. FINDINGS: Cholecystectomy clips are seen in the right upper quadrant of the abdomen. Retained oral contrast  throughout the visualized colon in the upper abdomen. Stable cardiomediastinal silhouette with normal heart size. No pneumothorax. No pleural effusion. Lungs appear clear, with no acute consolidative airspace disease and no pulmonary edema. IMPRESSION: No active cardiopulmonary disease. Electronically Signed   By: Delbert Phenix M.D.   On: 02/27/2022 10:35   DG ESOPHAGUS W SINGLE CM (SOL OR THIN BA)  Result Date: 02/26/2022 CLINICAL DATA:  Difficulty swallowing. Sensation of food becoming stuck in mid chest. EXAM: ESOPHAGUS/BARIUM SWALLOW/TABLET STUDY TECHNIQUE: Single contrast examination was performed using thin liquid barium. FLUOROSCOPY: Radiation Exposure Index (as provided by the fluoroscopic device): 18.9 mGy Kerma COMPARISON:  None available FINDINGS: Swallowing: Appears normal. No vestibular penetration or aspiration seen. Pharynx: Unremarkable. Esophagus: Normal appearance.  No fixed stenosis. Esophageal motility: Mild dysmotility Hiatal Hernia: None. Gastroesophageal reflux: None visualized. Ingested 13 mm barium tablet: Passed without significant delay. Other: None. IMPRESSION: 1. Mild diffuse esophageal dysmotility. 2. No significant esophageal stenosis or stricture. Electronically Signed   By: Acquanetta Belling M.D.   On: 02/26/2022 11:47   ECHOCARDIOGRAM COMPLETE  Result Date: 02/23/2022    ECHOCARDIOGRAM REPORT   Patient Name:   Kristina [name] [facility] Date of Exam: 02/23/2022 Medical Rec #:  [id number]        Height:       64.0 in Accession #:    [id number]       Weight:       142.0 lb Date of Birth:  04-23-1946         BSA:          1.691 m Patient Age:    76 years         BP:           166/106 mmHg Patient Gender: F                HR:           76 bpm. Exam Location:  Inpatient Procedure: 2D Echo, Color Doppler, Cardiac Doppler and Intracardiac            Opacification Agent Indications:    Chest Pain R07.9  History:        Patient has prior history of Echocardiogram examinations, most                  recent 02/25/2020. CAD; Risk Factors:Diabetes. Thyroid disease.                 Low sodium. GERD.  Referring Phys: [id number] HAO MENG IMPRESSIONS  1. Left ventricular ejection fraction, by estimation, is 55 to 60%. The left ventricle has normal function. The left ventricle has no regional wall motion abnormalities. Left ventricular diastolic parameters are consistent with Grade I diastolic dysfunction (impaired relaxation).  2. Right ventricular systolic function is normal. The right ventricular size is normal.  3. The mitral valve is grossly normal.  No evidence of mitral valve regurgitation.  4. There is mild calcification of the aortic valve. Aortic valve regurgitation is not visualized.  5. The inferior vena cava is normal in size with greater than 50% respiratory variability, suggesting right atrial pressure of 3 mmHg. Comparison(s): No significant change from prior study. FINDINGS  Left Ventricle: Left ventricular ejection fraction, by estimation, is 55 to 60%. The left ventricle has normal function. The left ventricle has no regional wall motion abnormalities. Definity contrast agent was given IV to delineate the left ventricular  endocardial borders. The left ventricular internal cavity size was normal in size. There is no left ventricular hypertrophy. Left ventricular diastolic parameters are consistent with Grade I diastolic dysfunction (impaired relaxation). Right Ventricle: The right ventricular size is normal. Right ventricular systolic function is normal. Left Atrium: Left atrial size was normal in size. Right Atrium: Right atrial size was normal in size. Pericardium: There is no evidence of pericardial effusion. Mitral Valve: The mitral valve is grossly normal. No evidence of mitral valve regurgitation. Tricuspid Valve: Tricuspid valve regurgitation is not demonstrated. Aortic Valve: There is mild calcification of the aortic valve. Aortic valve regurgitation is not visualized. Pulmonic Valve: The pulmonic  valve was not well visualized. Pulmonic valve regurgitation is not visualized. Aorta: The aortic root and ascending aorta are structurally normal, with no evidence of dilitation. Venous: The inferior vena cava is normal in size with greater than 50% respiratory variability, suggesting right atrial pressure of 3 mmHg. IAS/Shunts: No atrial level shunt detected by color flow Doppler. Additional Comments: There is a small pleural effusion.  LEFT VENTRICLE PLAX 2D LVIDd:         4.10 cm     Diastology LVIDs:         3.00 cm     LV e' medial:    3.33 cm/s LV PW:         0.90 cm     LV E/e' medial:  17.8 LV IVS:        1.00 cm     LV e' lateral:   5.96 cm/s LVOT diam:     2.00 cm     LV E/e' lateral: 9.9 LV SV:         55 LV SV Index:   33 LVOT Area:     3.14 cm  LV Volumes (MOD) LV vol d, MOD A2C: 81.2 ml LV vol d, MOD A4C: 95.2 ml LV vol s, MOD A2C: 34.1 ml LV vol s, MOD A4C: 43.6 ml LV SV MOD A2C:     47.1 ml LV SV MOD A4C:     95.2 ml LV SV MOD BP:      48.1 ml RIGHT VENTRICLE RV S prime:     20.80 cm/s TAPSE (M-mode): 1.7 cm LEFT ATRIUM             Index        RIGHT ATRIUM           Index LA diam:        4.10 cm 2.42 cm/m   RA Area:     10.80 cm LA Vol (A2C):   33.2 ml 19.63 ml/m  RA Volume:   21.30 ml  12.59 ml/m LA Vol (A4C):   33.9 ml 20.04 ml/m LA Biplane Vol: 35.2 ml 20.81 ml/m  AORTIC VALVE LVOT Vmax:   81.30 cm/s LVOT Vmean:  59.400 cm/s LVOT VTI:    0.175 m  AORTA Ao Root diam: 2.80 cm  Ao Asc diam:  3.00 cm MITRAL VALVE MV Area (PHT): 3.28 cm     SHUNTS MV Decel Time: 231 msec     Systemic VTI:  0.18 m MV E velocity: 59.20 cm/s   Systemic Diam: 2.00 cm MV A velocity: 103.00 cm/s MV E/A ratio:  0.57 Halford DecampMary Branch Electronically signed by Carolan ClinesMary Branch Signature Date/Time: 02/23/2022/2:47:42 PM    Final    MR BRAIN WO CONTRAST  Result Date: 02/22/2022 CLINICAL DATA:  Initial evaluation for neuro deficit, stroke suspected. EXAM: MRI HEAD WITHOUT CONTRAST MRA HEAD WITHOUT CONTRAST TECHNIQUE: Multiplanar,  multi-echo pulse sequences of the brain and surrounding structures were acquired without intravenous contrast. Angiographic images of the Circle of Willis were acquired using MRA technique without intravenous contrast. COMPARISON:  CT from earlier the same day. FINDINGS: MRI HEAD FINDINGS Brain: Examination mildly degraded by motion artifact. Cerebral volume within normal limits. Patchy T2/FLAIR hyperintensity involving the periventricular deep white matter both cerebral hemispheres, most consistent with chronic small vessel ischemic disease, mild to moderate in nature. Mild patchy involvement of the pons noted. No evidence for acute or subacute ischemia. Gray-white matter differentiation maintained. No areas of chronic cortical infarction. No acute or chronic intracranial blood products. No mass lesion, midline shift or mass effect no hydrocephalus or extra-axial fluid collection. Pituitary gland and suprasellar region normal. Vascular: Major intracranial vascular flow voids are maintained. Skull and upper cervical spine: Craniocervical junction normal. Bone marrow signal intensity within normal limits. No scalp soft tissue abnormality. Sinuses/Orbits: Prior bilateral ocular lens replacement. Mild scattered mucosal thickening noted about the ethmoidal air cells. Paranasal sinuses are otherwise clear. No significant mastoid effusion. Other: None. MRA HEAD FINDINGS Anterior circulation: Examination mildly degraded by motion artifact. Both internal carotid arteries patent to the termini without stenosis or other abnormality. A1 segments patent. Normal anterior communicating artery complex. Anterior cerebral arteries patent without visible stenosis. No M1 stenosis or occlusion. No proximal MCA branch occlusion. Distal MCA branches perfused and symmetric. Posterior circulation: Both vertebral arteries patent to the vertebrobasilar junction without stenosis. Both PICA are patent. Basilar patent to its distal aspect  without stenosis. Superior cerebellar and posterior cerebral arteries widely patent bilaterally. Anatomic variants: None significant.  No aneurysm. IMPRESSION: MRI HEAD IMPRESSION: 1. No acute intracranial infarct or other abnormality. 2. 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 MRA HEAD IMPRESSION: Negative intracranial MRA. No large vessel occlusion, hemodynamically significant stenosis, or other acute vascular abnormality. Electronically Signed   By: Rise MuBenjamin  McClintock M.D.   On: 02/22/2022 21:08   MR ANGIO HEAD WO CONTRAST  Result Date: 02/22/2022 CLINICAL DATA:  Initial evaluation for neuro deficit, stroke suspected. EXAM: MRI HEAD WITHOUT CONTRAST MRA HEAD WITHOUT CONTRAST TECHNIQUE: Multiplanar, multi-echo pulse sequences of the brain and surrounding structures were acquired without intravenous contrast. Angiographic images of the Circle of Willis were acquired using MRA technique without intravenous contrast. COMPARISON:  CT from earlier the same day. FINDINGS: MRI HEAD FINDINGS Brain: Examination mildly degraded by motion artifact. Cerebral volume within normal limits. Patchy T2/FLAIR hyperintensity involving the periventricular deep white matter both cerebral hemispheres, most consistent with chronic small vessel ischemic disease, mild to moderate in nature. Mild patchy involvement of the pons noted. No evidence for acute or subacute ischemia. Gray-white matter differentiation maintained. No areas of chronic cortical infarction. No acute or chronic intracranial blood products. No mass lesion, midline shift or mass effect no hydrocephalus or extra-axial fluid collection. Pituitary gland and suprasellar region normal. Vascular: Major intracranial vascular flow voids are maintained.  Skull and upper cervical spine: Craniocervical junction normal. Bone marrow signal intensity within normal limits. No scalp soft tissue abnormality. Sinuses/Orbits: Prior bilateral ocular lens replacement.  Mild scattered mucosal thickening noted about the ethmoidal air cells. Paranasal sinuses are otherwise clear. No significant mastoid effusion. Other: None. MRA HEAD FINDINGS Anterior circulation: Examination mildly degraded by motion artifact. Both internal carotid arteries patent to the termini without stenosis or other abnormality. A1 segments patent. Normal anterior communicating artery complex. Anterior cerebral arteries patent without visible stenosis. No M1 stenosis or occlusion. No proximal MCA branch occlusion. Distal MCA branches perfused and symmetric. Posterior circulation: Both vertebral arteries patent to the vertebrobasilar junction without stenosis. Both PICA are patent. Basilar patent to its distal aspect without stenosis. Superior cerebellar and posterior cerebral arteries widely patent bilaterally. Anatomic variants: None significant.  No aneurysm. IMPRESSION: MRI HEAD IMPRESSION: 1. No acute intracranial infarct or other abnormality. 2. 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 MRA HEAD IMPRESSION: Negative intracranial MRA. No large vessel occlusion, hemodynamically significant stenosis, or other acute vascular abnormality. Electronically Signed   By: Rise Mu M.D.   On: 02/22/2022 21:08   CT Head Wo Contrast  Result Date: 02/22/2022 CLINICAL DATA:  Headache, new or worrisome. EXAM: CT HEAD WITHOUT CONTRAST TECHNIQUE: Contiguous axial images were obtained from the base of the skull through the vertex without intravenous contrast. RADIATION DOSE REDUCTION: This exam was performed according to the departmental dose-optimization program which includes automated exposure control, adjustment of the mA and/or kV according to patient size and/or use of iterative reconstruction technique. COMPARISON:  MRI examination dated October 07, 2021 FINDINGS: Brain: No evidence of acute infarction, hemorrhage, hydrocephalus, extra-axial collection or mass lesion/mass effect. Prominence  of the ventricles and sulci secondary to moderate cerebral volume loss. Low-attenuation of the periventricular and subcortical white matter presumed chronic microvascular ischemic changes, unchanged. Vascular: No hyperdense vessel or unexpected calcification. Skull: Normal. Negative for fracture or focal lesion. Sinuses/Orbits: No acute finding.  Bilateral cataract surgery. Other: None. IMPRESSION: 1.  No acute intracranial abnormality. 2. 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 Electronically Signed   By: Larose Hires D.O.   On: 02/22/2022 10:18   DG Chest Portable 1 View  Result Date: 02/22/2022 CLINICAL DATA:  Shortness of breath.  Chest tightness. EXAM: PORTABLE CHEST 1 VIEW COMPARISON:  Two-view chest x-ray 02/16/2020 FINDINGS: The heart size and mediastinal contours are within normal limits. Both lungs are clear. The visualized skeletal structures are unremarkable. IMPRESSION: No active disease. Electronically Signed   By: Marin Roberts M.D.   On: 02/22/2022 09:14    Microbiology: Results for orders placed or performed during the hospital encounter of 02/22/22  MRSA Next Gen by PCR, Nasal     Status: None   Collection Time: 02/22/22  7:39 PM   Specimen: Nasal Mucosa; Nasal Swab  Result Value Ref Range Status   MRSA by PCR Next Gen NOT DETECTED NOT DETECTED Final    Comment: (NOTE) The GeneXpert MRSA Assay (FDA approved for NASAL specimens only), is one component of a comprehensive MRSA colonization surveillance program. It is not intended to diagnose MRSA infection nor to guide or monitor treatment for MRSA infections. Test performance is not FDA approved in patients less than 86 years old. Performed at [facility], [address], [place], [place] [address]   Urine Culture     Status: None   Collection Time: 02/23/22  5:46 PM   Specimen: Urine,  Catheterized  Result Value Ref Range  Status   Specimen Description   Final    URINE, CATHETERIZED Performed at [facility], [address] [place], [place] [address]    Special Requests   Final    NONE Performed at [facility], [address] [place], [place] [address]    Culture   Final    NO GROWTH Performed at [facility], [address], [place], [place] [address]    Report Status 02/25/2022 FINAL  Final    Labs: CBC: Recent Labs  Lab 02/22/22 0851 02/23/22 0313 02/28/22 0413  WBC 5.0 8.2 5.2  NEUTROABS 4.0  --   --   HGB 13.3 14.2 11.4*  HCT 37.1 38.8 32.9*  MCV 92.8 90.0 97.1  PLT 200 243 200   Basic Metabolic Panel: Recent Labs  Lab 02/25/22 0054 02/25/22 0806 02/25/22 1707 02/26/22 0318 02/27/22 0755 02/28/22 0413  NA 126* 128* 135 134* 133* 136  K 3.4* 3.4*  --  4.5 3.9 4.0  CL 93* 94*  --  102 99 99  CO2 23 24  --  23 26 29   GLUCOSE 126* 145*  --  155* 247* 185*  BUN 22 20  --  20 16 16   CREATININE 0.79 0.82  --  0.74 0.70 0.70  CALCIUM 8.7* 9.0  --  9.0 8.9 8.9   Liver Function Tests: Recent Labs  Lab 02/22/22 0851 02/23/22 0313  AST 19 22  ALT 12 14  ALKPHOS 52 59  BILITOT 0.9 1.1  PROT 6.8 7.2  ALBUMIN 3.9 4.2   CBG: Recent Labs  Lab 02/27/22 1130 02/27/22 1629 02/27/22 1958 02/28/22 0749 02/28/22 1134  GLUCAP 194* 279* 253* 248* 266*    Discharge time spent: greater than 30 minutes.  Signed: 04/30/22, MD [facility] 02/28/2022

## 2022-02-28 NOTE — Progress Notes (Signed)
The patient remains stable. She is alert, oriented x4, ambulatory with walker. No change in am assessment. Discharge instructions were reviewed. The pt requests medications be sent over to [facility], on [address] in [place]. The provider was contacted and will resubmit prescriptions to pts preferred pharmacy. The pt and family member denied questions, concerns at this time.

## 2022-02-28 NOTE — Progress Notes (Signed)
Occupational Therapy Treatment Patient Details Name: Kristina [name] MRN: [id number] DOB: [birth year] Today's Date: 02/28/2022   History of present illness Kristina [name] is a 76 y.o. female presented complaining of shortness of breath, chest pain at rest, difficulty finding words since this morning, she is also feeling confused,occipital headache. Admitted with malignant hypertension. CT/MRI: negative for acute event. PHMx: labile hypertension, hyperlipidemia, diabetes type, hypothyroidism, chronic diastolic heart failure, anxiety.   OT comments  Patient supervision for ADLs and ambulation with walker today. Cognition is back to baseline. Patient able to stand and perform oral care without UE support and ambulated hallway distance. Patient has sister and daughter's assistance at discharge. From an OT standpoint patient has progressed and has no OT needs at home. Recommend initial supervision to monitor cognition. Will continue to follow acutely to monitor consistency of skills and cognition.    Recommendations for follow up therapy are one component of a multi-disciplinary discharge planning process, led by the attending physician.  Recommendations may be updated based on patient status, additional functional criteria and insurance authorization.    Follow Up Recommendations  No OT follow up    Assistance Recommended at Discharge Intermittent Supervision/Assistance  Patient can return home with the following  Assistance with cooking/housework;Direct supervision/assist for financial management;Direct supervision/assist for medications management   Equipment Recommendations  None recommended by OT    Recommendations for Other Services      Precautions / Restrictions Precautions Precautions: Fall Precaution Comments: reports she fell about 2 months ago Restrictions Weight Bearing Restrictions: No       Mobility Bed Mobility                    Transfers                          Balance Overall balance assessment: Mild deficits observed, not formally tested   Sitting balance-Leahy Scale: Good       Standing balance-Leahy Scale: Fair Standing balance comment: use of walker at patient's request. can stand without UE support                           ADL either performed or assessed with clinical judgement   ADL   Eating/Feeding: Independent   Grooming: Supervision/safety;Standing Grooming Details (indicate cue type and reason): at sink. Does not need UE support                             Functional mobility during ADLs: Rolling walker (2 wheels);Supervision/safety General ADL Comments: supervision to ambulate in room to bathromo and stand at sink for oral care. Patient ambulated in hall as well with walker. No overt loss of balance.    Extremity/Trunk Assessment Upper Extremity Assessment Upper Extremity Assessment: Overall WFL for tasks assessed   Lower Extremity Assessment Lower Extremity Assessment: Defer to PT evaluation   Cervical / Trunk Assessment Cervical / Trunk Assessment: Normal    Vision Patient Visual Report: No change from baseline     Perception     Praxis      Cognition Arousal/Alertness: Awake/alert Behavior During Therapy: WFL for tasks assessed/performed Overall Cognitive Status: Within Functional Limits for tasks assessed  Exercises      Shoulder Instructions       General Comments      Pertinent Vitals/ Pain       Pain Assessment Pain Assessment: No/denies pain  Home Living                                          Prior Functioning/Environment              Frequency  Min 2X/week        Progress Toward Goals  OT Goals(current goals can now be found in the care plan section)  Progress towards OT goals: Progressing toward goals  Acute Rehab OT Goals Patient Stated Goal: return to  independence OT Goal Formulation: With patient Time For Goal Achievement: 03/12/22 Potential to Achieve Goals: Good  Plan Discharge plan needs to be updated    Co-evaluation                 AM-PAC OT "6 Clicks" Daily Activity     Outcome Measure   Help from another person eating meals?: None Help from another person taking care of personal grooming?: A Little Help from another person toileting, which includes using toliet, bedpan, or urinal?: A Little Help from another person bathing (including washing, rinsing, drying)?: A Little Help from another person to put on and taking off regular upper body clothing?: A Little Help from another person to put on and taking off regular lower body clothing?: A Little 6 Click Score: 19    End of Session Equipment Utilized During Treatment: Rolling walker (2 wheels)  OT Visit Diagnosis: Unsteadiness on feet (R26.81);Other abnormalities of gait and mobility (R26.89);Muscle weakness (generalized) (M62.81);History of falling (Z91.81);Pain   Activity Tolerance Patient tolerated treatment well   Patient Left in chair;with call bell/phone within reach   Nurse Communication Mobility status        Time: 6606-3016 OT Time Calculation (min): 14 min  Charges: OT General Charges $OT Visit: 1 Visit OT Evaluation $OT Eval Low Complexity: 1 Low  Aireal Slater, OTR/L Acute Care Rehab Services  Office [phone] Pager: [phone]   Kelli Churn 02/28/2022, 9:54 AM

## 2022-02-28 NOTE — TOC Transition Note (Signed)
Transition of Care [facility]) - CM/SW Discharge Note   Patient Details  Name: Kristina [name] MRN: [id number] Date of Birth: 03-Jan-1946  [facility] CM/SW Contact:  Lanier Clam, RN Phone Number: 02/28/2022, 11:28 AM   Clinical Narrative:spoke to son [name] about d/c plans-agree to [facility] services. Has own transport home.No further CM needs.       Final next level of care: Home w Home Health Services Barriers to Discharge: No Barriers Identified   Patient Goals and CMS Choice Patient states their goals for this hospitalization and ongoing recovery are:: Home CMS Medicare.gov Compare Post Acute Care list provided to:: Patient Represenative (must comment) ([name] [phone]) Choice offered to / list presented to : Adult Children  Discharge Placement                       Discharge Plan and Services   Discharge Planning Services: CM Consult Post Acute Care Choice: Home Health                    HH Arranged: PT, OT, Nurse's Aide HH Agency: [facility] Date [facility] Agency Contacted: 02/28/22 Time HH Agency Contacted: 1128 Representative spoke with at [facility] Agency: Kandee Keen  Social Determinants of Health (SDOH) Interventions     Readmission Risk Interventions     No data to display

## 2022-03-05 DIAGNOSIS — R2689 Other abnormalities of gait and mobility: Secondary | ICD-10-CM | POA: Diagnosis not present

## 2022-03-05 DIAGNOSIS — R413 Other amnesia: Secondary | ICD-10-CM | POA: Diagnosis not present

## 2022-03-05 DIAGNOSIS — E1165 Type 2 diabetes mellitus with hyperglycemia: Secondary | ICD-10-CM | POA: Diagnosis not present

## 2022-03-05 DIAGNOSIS — E871 Hypo-osmolality and hyponatremia: Secondary | ICD-10-CM | POA: Diagnosis not present

## 2022-03-07 ENCOUNTER — Telehealth: Payer: Self-pay | Admitting: Cardiovascular Disease

## 2022-03-07 NOTE — Progress Notes (Unsigned)
Cardiology Clinic Note   Patient Name: Kristina [name] Date of Encounter: 03/07/2022  Primary Care Provider:  Shon Hale, MD Primary Cardiologist:  Charlton Haws, MD  Patient Profile    Kristina [name] 76 year old female presents the clinic today for follow-up evaluation of her chronic diastolic CHF and hypertension.  Past Medical History    Past Medical History:  Diagnosis Date   Anxiety    Asthma    Diabetes (HCC)    Diabetes mellitus without complication (HCC)    Diverticulitis    GERD (gastroesophageal reflux disease)    HTN (hypertension)    Hypercholesteremia    Hyperlipidemia    Hypertension    Obesity    Rosacea    Thyroid disease    Ulcerative colitis (HCC)    Past Surgical History:  Procedure Laterality Date   ACHILLES TENDON REPAIR     APPENDECTOMY     BIOPSY  10/16/2018   Procedure: BIOPSY;  Surgeon: Kathi Der, MD;  Location: [facility] ENDOSCOPY;  Service: Gastroenterology;;   CESAREAN SECTION     CHOLECYSTECTOMY     FLEXIBLE SIGMOIDOSCOPY N/A 10/16/2018   Procedure: FLEXIBLE SIGMOIDOSCOPY;  Surgeon: Kathi Der, MD;  Location: [facility] ENDOSCOPY;  Service: Gastroenterology;  Laterality: N/A;   hammertoe      Allergies  Allergies  Allergen Reactions   Ace Inhibitors Cough   Ciprofloxacin Other (See Comments)    Tendinitis in heel and posterior right LE Tendon rupture   Doxycycline Other (See Comments)    Pt does not remember what the reaction was - many years ago   Shrimp [Shellfish Allergy] Diarrhea, Nausea And Vomiting and Rash    History of Present Illness    Kristina [name] has a PMH of hypertension, diabetes, hyponatremia, thyroid disease, HLD, GERD, chest pain, HA, hypertensive encephalopathy, and chronic diastolic CHF. Her cardiac CT 2019 showed a calcium score of 122 and placed her in the 68th percentile.  She was admitted to the hospital on 7/27 and discharged on 02/28/2022.  She presented with shortness of breath and  chest discomfort at rest.  She reported symptoms for the previous month which were worsened with exertion.  She described her chest discomfort as progressive tightness.  She also reported pressured speech and felt confused.  Her son noticed that she was having difficulty expressing herself and she was brought to the emergency department.  Her blood pressure was elevated (207/105) and she was noted to be hyponatremic.  Cardiology was consulted no intervention was recommended.  Her encephalopathy improved which was felt to be hypertensive encephalopathy.  She received IV labetalol and IV hydralazine.  She was transitioned to valsartan, HCTZ, nebivolol, and furosemide.  Neurology was consulted and felt her symptoms were caused by her hyponatremia and blood pressure.  It was recommended that she had evaluation for dementia as an outpatient.  Her troponins were negative x 2.  She was noted to have acute on chronic diastolic CHF that was felt to be caused by her blood pressure.  Her echocardiogram showed G1 DD and EF of 55%.  Cardiology recommended coronary CTA however this was canceled.  A PET/CT stress was recommended for OP eval.  She presents to clinic today for follow-up evaluation and states***  *** denies chest pain, shortness of breath, lower extremity edema, fatigue, palpitations, melena, hematuria, hemoptysis, diaphoresis, weakness, presyncope, syncope, orthopnea, and PND.  Essential hypertension-BP today***.  Well-controlled at home with valsartan, HCTZ, nebivolol, and furosemide Maintain blood pressure log Increase  physical activity as tolerated Heart healthy low-sodium diet SLT 6 given Order BMP  Acute on chronic diastolic CHF-no increased DOE or activity intolerance.  Weight stable.  Euvolemic.  Echocardiogram showed LVEF of 55% and G1 DD.  Details above. Continue furosemide, HCTZ, valsartan Heart healthy low-sodium diet-salty 6 given Increase physical activity as tolerated  Coronary artery  disease-denies recent episodes of chest discomfort.  During recent admissions troponins were negative x 2.  Echocardiogram showed LVEF of 55 and G1 DD.  Outpatient ischemic evaluation was recommended. Order cardiac PET/CT  Hyperlipidemia-LDL*** Continue simvastatin Heart healthy low-sodium high-fiber diet Increase physical activity as tolerated  Disposition: Follow-up with Dr. Eden Emms or APP after CT.  Home Medications    Prior to Admission medications   Medication Sig Start Date End Date Taking? Authorizing Provider  acetaminophen (TYLENOL) 500 MG tablet Take 1,000 mg by mouth 2 (two) times daily as needed (pain).    [provider]  budesonide (ENTOCORT EC) 3 MG 24 hr capsule Take 3 mg by mouth daily.    [provider]  CALCIUM PO Take 500 mg by mouth at bedtime.    [provider]  cyanocobalamin (VITAMIN B12) 1000 MCG tablet Take 1,000 mcg by mouth daily.    [provider]  desonide (DESOWEN) 0.05 % cream Apply 1 Application topically daily as needed for rash. 11/15/21   [provider]  furosemide (LASIX) 20 MG tablet Take 1 tablet (20 mg total) by mouth daily. 02/28/22   Meredeth Ide, MD  hyoscyamine (LEVSIN) 0.125 MG tablet Take 0.125 mg by mouth 2 (two) times daily as needed for cramping. 01/04/20   [provider]  levothyroxine (SYNTHROID) 50 MCG tablet Take 50 mcg by mouth every morning. 01/20/22   [provider]  mesalamine (APRISO) 0.375 g 24 hr capsule Take 4 capsules by mouth daily. 08/28/18   [provider]  metFORMIN (GLUCOPHAGE-XR) 500 MG 24 hr tablet Take 3 tablets (1,500 mg total) by mouth daily. 04/05/18   Lenox Ponds, MD  nebivolol (BYSTOLIC) 10 MG tablet Take 1 tablet (10 mg total) by mouth daily. 02/28/22   Meredeth Ide, MD  Probiotic Product (ALIGN PO) Take 1 tablet by mouth daily.    [provider]  simethicone (MYLICON) 125 MG chewable tablet Chew 125 mg by mouth every 6 (six)  hours as needed for flatulence.    [provider]  simvastatin (ZOCOR) 20 MG tablet Take 20 mg by mouth every evening.  08/05/13   [provider]  tizanidine (ZANAFLEX) 2 MG capsule Take 1-2 capsules (2-4 mg total) by mouth 3 (three) times daily. Patient taking differently: Take 2-4 mg by mouth 3 (three) times daily as needed for muscle spasms. 10/03/20   [name], FNP  valsartan (DIOVAN) 160 MG tablet Take 1 tablet (160 mg total) by mouth daily. 02/28/22 02/28/23  Meredeth Ide, MD    Family History    Family History  Problem Relation Age of Onset   Hypertension Mother    Heart failure Mother    Heart attack Mother    Hypotension Father    Dementia Father    She indicated that her mother is deceased. She indicated that her father is alive. She indicated that her maternal grandmother is deceased. She indicated that her maternal grandfather is deceased. She indicated that her paternal grandmother is deceased. She indicated that her paternal grandfather is deceased.  Social History    Social History  Socioeconomic History   Marital status: Divorced    Spouse name: Not on file   Number of children: Not on file   Years of education: Not on file   Highest education level: Not on file  Occupational History   Not on file  Tobacco Use   Smoking status: Never   Smokeless tobacco: Never  Vaping Use   Vaping Use: Never used  Substance and Sexual Activity   Alcohol use: No    Alcohol/week: 0.0 standard drinks of alcohol   Drug use: No   Sexual activity: Not Currently  Other Topics Concern   Not on file  Social History Narrative   ** Merged History Encounter **       Social Determinants of Health   Financial Resource Strain: Not on file  Food Insecurity: Not on file  Transportation Needs: Not on file  Physical Activity: Not on file  Stress: Not on file  Social Connections: Not on file  Intimate Partner Violence: Not on file     Review of Systems     General:  No chills, fever, night sweats or weight changes.  Cardiovascular:  No chest pain, dyspnea on exertion, edema, orthopnea, palpitations, paroxysmal nocturnal dyspnea. Dermatological: No rash, lesions/masses Respiratory: No cough, dyspnea Urologic: No hematuria, dysuria Abdominal:   No nausea, vomiting, diarrhea, bright red blood per rectum, melena, or hematemesis Neurologic:  No visual changes, wkns, changes in mental status. All other systems reviewed and are otherwise negative except as noted above.  Physical Exam    VS:  There were no vitals taken for this visit. , BMI There is no height or weight on file to calculate BMI. GEN: Well nourished, well developed, in no acute distress. HEENT: normal. Neck: Supple, no JVD, carotid bruits, or masses. Cardiac: RRR, no murmurs, rubs, or gallops. No clubbing, cyanosis, edema.  Radials/DP/PT 2+ and equal bilaterally.  Respiratory:  Respirations regular and unlabored, clear to auscultation bilaterally. GI: Soft, nontender, nondistended, BS + x 4. MS: no deformity or atrophy. Skin: warm and dry, no rash. Neuro:  Strength and sensation are intact. Psych: Normal affect.  Accessory Clinical Findings    Recent Labs: 02/22/2022: B Natriuretic Peptide 225.3 02/23/2022: ALT 14 02/24/2022: TSH 2.163 02/28/2022: BUN 16; Creatinine, Ser 0.70; Hemoglobin 11.4; Platelets 200; Potassium 4.0; Sodium 136   Recent Lipid Panel    Component Value Date/Time   CHOL 126 04/03/2018 1842   TRIG 159 (H) 04/03/2018 1842   HDL 41 04/03/2018 1842   CHOLHDL 3.1 04/03/2018 1842   VLDL 32 04/03/2018 1842   LDLCALC 53 04/03/2018 1842    ECG personally reviewed by me today- *** - No acute changes  Echocardiogram 02/23/2022  IMPRESSIONS     1. Left ventricular ejection fraction, by estimation, is 55 to 60%. The  left ventricle has normal function. The left ventricle has no regional  wall motion abnormalities. Left ventricular diastolic parameters are   consistent with Grade I diastolic  dysfunction (impaired relaxation).   2. Right ventricular systolic function is normal. The right ventricular  size is normal.   3. The mitral valve is grossly normal. No evidence of mitral valve  regurgitation.   4. There is mild calcification of the aortic valve. Aortic valve  regurgitation is not visualized.   5. The inferior vena cava is normal in size with greater than 50%  respiratory variability, suggesting right atrial pressure of 3 mmHg.   Comparison(s): No significant change from prior study.  Assessment & Plan  1.  ***   Jossie Ng. Ronella Plunk NP-C     03/07/2022, 7:09 AM [place] [address] Office [phone] Fax [phone]  Notice: This dictation was prepared with Dragon dictation along with smaller phrase technology. Any transcriptional errors that result from this process are unintentional and may not be corrected upon review.  I spent***minutes examining this patient, reviewing medications, and using patient centered shared decision making involving her cardiac care.  Prior to her visit I spent greater than 20 minutes reviewing her past medical history,  medications, and prior cardiac tests.

## 2022-03-07 NOTE — Telephone Encounter (Signed)
Kristina [name], physical therapist with [facility] called stating he took patient BP and it was 180/80 both arms and remained that way for over 30 mins.  He stated she showed no distress, complained of no SOB, chest pain or headaches.

## 2022-03-07 NOTE — Telephone Encounter (Signed)
Spoke with [name] who is calling to let us know of the patient's elevated blood pressure. She was asymptomatic.  Patient is scheduled for an office visit tomorrow, [name] is unsure if the patient is aware of this. I have called the patient and made her aware.

## 2022-03-08 ENCOUNTER — Ambulatory Visit: Payer: Medicare PPO | Admitting: General Practice

## 2022-03-08 ENCOUNTER — Encounter: Payer: Self-pay | Admitting: General Practice

## 2022-03-08 ENCOUNTER — Telehealth: Payer: Self-pay | Admitting: Cardiovascular Disease

## 2022-03-08 VITALS — BP 128/68 | HR 72 | Ht 64.0 in | Wt 148.6 lb

## 2022-03-08 DIAGNOSIS — I5043 Acute on chronic combined systolic (congestive) and diastolic (congestive) heart failure: Secondary | ICD-10-CM | POA: Diagnosis not present

## 2022-03-08 DIAGNOSIS — E782 Mixed hyperlipidemia: Secondary | ICD-10-CM | POA: Diagnosis not present

## 2022-03-08 DIAGNOSIS — I10 Essential (primary) hypertension: Secondary | ICD-10-CM

## 2022-03-08 DIAGNOSIS — I2510 Atherosclerotic heart disease of native coronary artery without angina pectoris: Secondary | ICD-10-CM | POA: Diagnosis not present

## 2022-03-08 NOTE — Telephone Encounter (Signed)
Caller stated they will need continuing orders for her home health PT.  Frequency twice a week for 4 weeks then once a week for two weeks.

## 2022-03-08 NOTE — Patient Instructions (Signed)
Medication Instructions:  The current medical regimen is effective;  continue present plan and medications as directed. Please refer to the Current Medication list given to you today.   *If you need a refill on your cardiac medications before your next appointment, please call your pharmacy*  Lab Work:   Testing/Procedures:  NONE    MAKE SURE TO HAVE YOUR PET CT THIS MONTH  If you have labs (blood work) drawn today and your tests are completely normal, you will receive your results only by: MyChart Message (if you have MyChart) OR  A paper copy in the mail If you have any lab test that is abnormal or we need to change your treatment, we will call you to review the results.  You may also go to any of these [facility] locations:  [facility] [address],  [address] ([facility]) [address]  [phone] [address]  [place]   [address] [place] [address]  [address]   [address] [employer] [address]  [address] Geneticist, molecular)  Special Instructions TAKE AND LOG YOUR BLOOD PRESSURE   TAKE AND LOG YOUR WEIGHT DAILY  Follow-Up: Your next appointment:  AFTER TESTING WITH   In Person with Charlton Haws, MD    At [facility], you and your health needs are our priority.  As part of our continuing mission to provide you with exceptional heart care, we have created designated Provider Care Teams.  These Care Teams include your primary Cardiologist (physician) and Advanced Practice Providers (APPs -  Physician Assistants and Nurse Practitioners) who all work together to provide you with the care you need, when you need it.  We recommend signing up for the patient portal called "MyChart".  Sign up information is provided on this After Visit Summary.  MyChart is used to connect with patients for Virtual Visits (Telemedicine).   Patients are able to view lab/test results, encounter notes, upcoming appointments, etc.  Non-urgent messages can be sent to your provider as well.   To learn more about what you can do with MyChart, go to [email or web address].    Important Information About Sugar

## 2022-03-13 NOTE — Telephone Encounter (Signed)
Kristina [name]., [facility] notified ok to extend PT  frequency twice a week for 4 weeks then once a week for two weeks.  [name] states that he already received an extension from PCP.

## 2022-03-22 ENCOUNTER — Telehealth (HOSPITAL_COMMUNITY): Payer: Self-pay | Admitting: *Deleted

## 2022-03-22 NOTE — Telephone Encounter (Signed)
Patient calling about her upcoming cardiac imaging study; pt verbalizes understanding of appt date/time, parking situation and where to check in, pre-test NPO status and verified current allergies; name and call back number provided for further questions should they arise  Larey Brick RN Navigator Cardiac Imaging [facility] [phone] office [phone] cell  She is aware to avoid caffeine 12 hours prior to her Cardiac PET. She will hold her Bystolic the night before.

## 2022-03-27 ENCOUNTER — Encounter (HOSPITAL_COMMUNITY)
Admission: RE | Admit: 2022-03-27 | Discharge: 2022-03-27 | Disposition: A | Discharge status: Home / Self Care | Payer: Medicare PPO | Source: Ambulatory Visit | Attending: Cardiovascular Disease | Admitting: Cardiovascular Disease

## 2022-03-27 ENCOUNTER — Other Ambulatory Visit (HOSPITAL_COMMUNITY): Payer: Medicare PPO

## 2022-03-27 DIAGNOSIS — R072 Precordial pain: Secondary | ICD-10-CM | POA: Diagnosis not present

## 2022-03-27 LAB — NM PET CT CARDIAC PERFUSION MULTI W/ABSOLUTE BLOODFLOW
LV dias vol: 87 mL (ref 46–106)
LV sys vol: 34 mL
MBFR: 2.54
Nuc Rest EF: 58 %
Nuc Stress EF: 61 %
Rest MBF: 0.9 ml/g/min
Rest Nuclear Isotope Dose: 17.3 mCi
ST Depression (mm): 0 mm
Stress MBF: 2.29 ml/g/min
Stress Nuclear Isotope Dose: 17.6 mCi
TID: 1.09

## 2022-03-27 MED ORDER — RUBIDIUM RB82 GENERATOR (RUBYFILL)
17.3000 | PACK | Freq: Once | INTRAVENOUS | Status: AC
  Administered 2022-03-27: 17.3 via INTRAVENOUS

## 2022-03-27 MED ORDER — RUBIDIUM RB82 GENERATOR (RUBYFILL)
17.6000 | PACK | Freq: Once | INTRAVENOUS | Status: AC
  Administered 2022-03-27: 17.6 via INTRAVENOUS

## 2022-03-27 MED ORDER — REGADENOSON 0.4 MG/5ML IV SOLN: 0.4000 mg | Freq: Once | INTRAVENOUS | Status: AC

## 2022-03-27 MED ORDER — REGADENOSON 0.4 MG/5ML IV SOLN
INTRAVENOUS | Status: AC
  Administered 2022-03-27: 0.4 mg via INTRAVENOUS
  Filled 2022-03-27: qty 5

## 2022-03-27 NOTE — Progress Notes (Signed)
Patient presents for a cardiac stress PET test and patient tolerated test with incident.  Patient maintained vital signs within acceptable parameters. Patient offered caffeine and ambulated out of department with a steady gait.

## 2022-03-30 DIAGNOSIS — E785 Hyperlipidemia, unspecified: Secondary | ICD-10-CM | POA: Diagnosis not present

## 2022-03-30 DIAGNOSIS — I10 Essential (primary) hypertension: Secondary | ICD-10-CM | POA: Diagnosis not present

## 2022-03-30 DIAGNOSIS — E039 Hypothyroidism, unspecified: Secondary | ICD-10-CM | POA: Diagnosis not present

## 2022-04-04 DIAGNOSIS — I10 Essential (primary) hypertension: Secondary | ICD-10-CM | POA: Diagnosis not present

## 2022-04-04 DIAGNOSIS — E785 Hyperlipidemia, unspecified: Secondary | ICD-10-CM | POA: Diagnosis not present

## 2022-04-04 DIAGNOSIS — E039 Hypothyroidism, unspecified: Secondary | ICD-10-CM | POA: Diagnosis not present

## 2022-04-04 DIAGNOSIS — Z0000 Encounter for general adult medical examination without abnormal findings: Secondary | ICD-10-CM | POA: Diagnosis not present

## 2022-04-11 ENCOUNTER — Telehealth: Payer: Self-pay | Admitting: Cardiovascular Disease

## 2022-04-11 NOTE — Telephone Encounter (Signed)
Burna Mortimer from [facility] is calling about an outstanding order for Home Health that she faxed over on 8/9 and 9/8.  She needed the order signed by Dr. Eden Emms and faxed back.  She wants to know if it has been received as they still have not received it back. It's out of medicare compliance by one month.

## 2022-04-11 NOTE — Telephone Encounter (Signed)
Spoke with Burna Mortimer and unable to find orders Per Burna Mortimer will refax  Will forward message to Antonietta Breach RN to be on the look out for orders ./cy

## 2022-04-13 NOTE — Telephone Encounter (Signed)
Marcelino Duster,  Please fax this request to [facility].  DOD or one of the providers that has previously seen this patient will be able to sign PT request.  Thank you.

## 2022-04-13 NOTE — Telephone Encounter (Signed)
Marcelino Duster,   Please fax this request to [facility].  DOD or one of the providers that has previously seen this patient will be able to sign PT request.   Thank you. Edd Fabian, NP  ------------------------- I will just wait to give it to him on Monday(Nishan).  Ethelda Chick, RN

## 2022-04-18 NOTE — Telephone Encounter (Signed)
Patient's paperwork was signed and sent to fax number provided.

## 2022-04-18 NOTE — Telephone Encounter (Signed)
Kristina [name] from [facility] is calling again to try and get this form signed, dated and sent back.  Fax # [phone] She wanted it to be noted that this is now a month past being in insurance compliance.

## 2022-05-02 DIAGNOSIS — K580 Irritable bowel syndrome with diarrhea: Secondary | ICD-10-CM | POA: Diagnosis not present

## 2022-05-07 ENCOUNTER — Telehealth: Payer: Self-pay | Admitting: Cardiovascular Disease

## 2022-05-07 NOTE — Telephone Encounter (Signed)
Patient aware of results. See result notes.  

## 2022-05-07 NOTE — Telephone Encounter (Signed)
Pt returning call. Please advise. °

## 2022-05-21 NOTE — Progress Notes (Signed)
Cardiology Office Note   Date:  05/25/2022   ID:  Kristina, [name] October 25, 1945, MRN [id number]  PCP:  Glenis Smoker, MD  Cardiologist:  Jenkins Rouge, MD EP: None  No chief complaint on file.      History of Present Illness: Kristina [name] is a 76 y.o. 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  She had normal EF by TTE and normal f/u PET CT With good EF and normal blood flow reserve   Living alone with cats Has family in [place] area and daughter in [place] that looks in on her   Updated NM PET CT normal 03/27/22 and TTE 02/23/22 also normal with grade One diastolic and no significant valve dx EF 55-60%   Having some blanching of right hand in cold weather    Past Medical History:  Diagnosis Date   Anxiety    Asthma    Diabetes ([place])    Diabetes mellitus without complication ([place])    Diverticulitis    GERD (gastroesophageal reflux disease)    HTN (hypertension)    Hypercholesteremia    Hyperlipidemia    Hypertension    Obesity    Rosacea    Thyroid disease    Ulcerative colitis ([place])     Past Surgical History:  Procedure Laterality Date   ACHILLES TENDON REPAIR     APPENDECTOMY     BIOPSY  10/16/2018   Procedure: BIOPSY;  Surgeon: Otis Brace, MD;  Location: [place];  Service: Gastroenterology;;   CESAREAN SECTION     CHOLECYSTECTOMY     FLEXIBLE SIGMOIDOSCOPY N/A 10/16/2018   Procedure: FLEXIBLE SIGMOIDOSCOPY;  Surgeon: Otis Brace, MD;  Location: [place];  Service:  Gastroenterology;  Laterality: N/A;   hammertoe       Current Outpatient Medications  Medication Sig Dispense Refill   acetaminophen (TYLENOL) 500 MG tablet Take 1,000 mg by mouth 2 (two) times daily as needed (pain).     CALCIUM PO Take 500 mg by mouth at bedtime.     cyanocobalamin (VITAMIN B12) 1000 MCG tablet Take 1,000 mcg by mouth daily.     furosemide (LASIX) 20 MG tablet Take 1 tablet (20 mg total) by mouth daily. 90 tablet 1   hyoscyamine (LEVSIN) 0.125 MG tablet Take 0.125 mg by mouth 2 (two) times daily as needed for cramping.     levothyroxine (SYNTHROID) 50 MCG tablet Take 50 mcg by mouth every morning.     mesalamine (APRISO) 0.375 g 24 hr capsule Take 4 capsules by mouth daily.     metFORMIN (GLUCOPHAGE-XR) 500 MG 24 hr tablet Take 3 tablets (1,500 mg total) by mouth daily.  0   nebivolol (BYSTOLIC) 10 MG tablet Take 1 tablet (10 mg total) by mouth daily. 30 tablet 3   Probiotic Product (ALIGN PO) Take 1 tablet by mouth daily.     simethicone (MYLICON) 0000000 MG chewable tablet Chew 125 mg by mouth every 6 (six) hours as needed  for flatulence.     simvastatin (ZOCOR) 20 MG tablet Take 20 mg by mouth every evening.      valsartan (DIOVAN) 160 MG tablet Take 1 tablet (160 mg total) by mouth daily. 30 tablet 11   budesonide (ENTOCORT EC) 3 MG 24 hr capsule Take 3 mg by mouth daily. (Patient not taking: Reported on 05/25/2022)     No current facility-administered medications for this visit.    Allergies:   Ace inhibitors, Ciprofloxacin, Doxycycline, and Shrimp [shellfish allergy]    Social History:  The patient  reports that she has never smoked. She has never used smokeless tobacco. She reports that she does not drink alcohol and does not use drugs.   Family History:  The patient's family history includes Dementia in her father; Heart attack in her mother; Heart failure in her mother; Hypertension in her mother; Hypotension in her father.    ROS:  Please see the history of  present illness.   Otherwise, review of systems are positive for none.   All other systems are reviewed and negative.    PHYSICAL EXAM: VS:  BP 138/84   Pulse 76   Ht 5\' 4"  (1.626 m)   Wt 148 lb (67.1 kg)   BMI 25.40 kg/m  , BMI Body mass index is 25.4 kg/m.  Affect appropriate Elderly female  HEENT: normal Neck supple with no adenopathy JVP normal no bruits no thyromegaly Lungs clear with no wheezing and good diaphragmatic motion Heart:  S1/S2 no murmur, no rub, gallop or click PMI normal Abdomen: benighn, BS positve, no tenderness, no AAA no bruit.  No HSM or HJR Distal pulses intact with no bruits LE varicosities with edema Neuro non-focal Skin warm and dry No muscular weakness Normal right radial/ulnar pulses with normal capillary refill   EKG:   SR rate 70 poor R wave progression 05/30/20 05/25/2022 SR rate 62 normal    Recent Labs: 02/22/2022: B Natriuretic Peptide 225.3 02/23/2022: ALT 14 02/24/2022: TSH 2.163 02/28/2022: BUN 16; Creatinine, Ser 0.70; Hemoglobin 11.4; Platelets 200; Potassium 4.0; Sodium 136    Lipid Panel    Component Value Date/Time   CHOL 126 04/03/2018 1842   TRIG 159 (H) 04/03/2018 1842   HDL 41 04/03/2018 1842   CHOLHDL 3.1 04/03/2018 1842   VLDL 32 04/03/2018 1842   LDLCALC 53 04/03/2018 1842      Wt Readings from Last 3 Encounters:  05/25/22 148 lb (67.1 kg)  03/08/22 148 lb 9.6 oz (67.4 kg)  02/26/22 142 lb 3.2 oz (64.5 kg)      Other studies Reviewed: Additional studies/ records that were reviewed today include:   PET/CT 03/27/22 Narrative & Impression      LV perfusion is abnormal. There is no evidence of ischemia. There is no evidence of infarction. Defect 1: There is a small defect with mild reduction in uptake present in the mid anterolateral location(s) that is reversible. There is normal wall motion in the defect area. In the setting of normal myocardial blood flow of both global (2.56) and per vessel analysis (2.44  LCX) artifact is favored over small inducible ischemia.   Myocardial blood flow was computed to be 0.87ml/g/min at rest and 2.54ml/g/min at stress. Global myocardial blood flow reserve was 2.54 and was normal.   Rest left ventricular function is normal. Rest EF: 58 %. Stress left ventricular function is normal. Stress EF: 61 %. End diastolic cavity size is normal. End systolic cavity size is normal.   LVEF  Reserve is 3% (low risk).   Coronary calcium was present on the attenuation correction CT images. Moderate coronary calcifications were present. Coronary calcifications were present in the left anterior descending artery, left circumflex artery and right coronary artery distribution(s).   Findings are consistent with no prior myocardial infarction. The study is low risk.   Read by Rudean Haskell MD FASE     Echocardiogram  02/13/22   IMPRESSIONS     1. Left ventricular ejection fraction, by estimation, is 55 to 60%. The  left ventricle has normal function. The left ventricle has no regional  wall motion abnormalities. Left ventricular diastolic parameters are  consistent with Grade I diastolic  dysfunction (impaired relaxation).   2. Right ventricular systolic function is normal. The right ventricular  size is normal.   3. The mitral valve is grossly normal. No evidence of mitral valve  regurgitation.   4. There is mild calcification of the aortic valve. Aortic valve  regurgitation is not visualized.   5. The inferior vena cava is normal in size with greater than 50%  respiratory variability, suggesting right atrial pressure of 3 mmHg.   Comparison(s): No significant change from prior study.    ASSESSMENT AND PLAN:  1. Chest pain: Atypical  Cardiac CT 07/04/18 calcium score 122 68 th percentile non obstructive disease in RCA/LAD < 50% proximal LAD and ostial / mid RCA  PET/CT Normal 03/27/22 Observe   2. Labile HTN:   Currently on  lasix, bystolic and diovan stable  3.  Varicose Veins:  Discussed primary making referral to [facility] , compression stockings, elevation and low dose non lasix diuretic Norvasc has been d/c   4. HLD: LDL 53  continue statin labs with primary   5. DM type 2: Last A1C 6.6 at goal of <7 - Continue current regimen and close follow-up with PCP  6. Diastolic CHF:   BNP 123XX123 [id number] echo 02/25/20 EF 55% GLS normal -15 only grade one diastolic and mild MR  She is euovolemic knows to take additional lasix as needed Updated TTE 02/23/22 EF 0000000 Grade 1 diastolic normal RV and no valve dx  7. Thyroid: continue synthroid replacement TSH 2.1 02/24/22      No orders of the defined types were placed in this encounter.     Disposition:   FU with me in 6 months   Signed, Jenkins Rouge, MD  05/25/2022 9:06 AM

## 2022-05-25 ENCOUNTER — Encounter: Payer: Self-pay | Admitting: Cardiovascular Disease

## 2022-05-25 ENCOUNTER — Ambulatory Visit: Payer: Medicare PPO | Attending: Cardiovascular Disease | Admitting: Cardiovascular Disease

## 2022-05-25 VITALS — BP 138/84 | HR 76 | Ht 64.0 in | Wt 148.0 lb

## 2022-05-25 DIAGNOSIS — I2510 Atherosclerotic heart disease of native coronary artery without angina pectoris: Secondary | ICD-10-CM

## 2022-05-25 DIAGNOSIS — E782 Mixed hyperlipidemia: Secondary | ICD-10-CM | POA: Diagnosis not present

## 2022-05-25 DIAGNOSIS — I5043 Acute on chronic combined systolic (congestive) and diastolic (congestive) heart failure: Secondary | ICD-10-CM | POA: Diagnosis not present

## 2022-05-25 DIAGNOSIS — I10 Essential (primary) hypertension: Secondary | ICD-10-CM

## 2022-05-25 NOTE — Patient Instructions (Signed)
Medication Instructions:  Your physician recommends that you continue on your current medications as directed. Please refer to the Current Medication list given to you today.  *If you need a refill on your cardiac medications before your next appointment, please call your pharmacy*  Lab Work: If you have labs (blood work) drawn today and your tests are completely normal, you will receive your results only by: MyChart Message (if you have MyChart) OR A paper copy in the mail If you have any lab test that is abnormal or we need to change your treatment, we will call you to review the results.  Testing/Procedures: None ordered today.  Follow-Up: At [facility], you and your health needs are our priority.  As part of our continuing mission to provide you with exceptional heart care, we have created designated Provider Care Teams.  These Care Teams include your primary Cardiologist (physician) and Advanced Practice Providers (APPs -  Physician Assistants and Nurse Practitioners) who all work together to provide you with the care you need, when you need it.  We recommend signing up for the patient portal called "MyChart".  Sign up information is provided on this After Visit Summary.  MyChart is used to connect with patients for Virtual Visits (Telemedicine).  Patients are able to view lab/test results, encounter notes, upcoming appointments, etc.  Non-urgent messages can be sent to your provider as well.   To learn more about what you can do with MyChart, go to [email or web address].    Your next appointment:   1 year(s)  The format for your next appointment:   In Person  Provider:   Peter Nishan, MD     Important Information About Sugar       

## 2022-06-11 DIAGNOSIS — R200 Anesthesia of skin: Secondary | ICD-10-CM | POA: Diagnosis not present

## 2022-06-26 DIAGNOSIS — G5601 Carpal tunnel syndrome, right upper limb: Secondary | ICD-10-CM | POA: Diagnosis not present

## 2022-08-10 DIAGNOSIS — G5603 Carpal tunnel syndrome, bilateral upper limbs: Secondary | ICD-10-CM | POA: Diagnosis not present

## 2022-08-13 DIAGNOSIS — G609 Hereditary and idiopathic neuropathy, unspecified: Secondary | ICD-10-CM | POA: Diagnosis not present

## 2022-08-13 DIAGNOSIS — G5603 Carpal tunnel syndrome, bilateral upper limbs: Secondary | ICD-10-CM | POA: Diagnosis not present

## 2022-09-06 DIAGNOSIS — G5603 Carpal tunnel syndrome, bilateral upper limbs: Secondary | ICD-10-CM | POA: Diagnosis not present

## 2022-09-28 DIAGNOSIS — G5601 Carpal tunnel syndrome, right upper limb: Secondary | ICD-10-CM | POA: Diagnosis not present

## 2022-10-11 DIAGNOSIS — E261 Secondary hyperaldosteronism: Secondary | ICD-10-CM | POA: Diagnosis not present

## 2022-10-11 DIAGNOSIS — I110 Hypertensive heart disease with heart failure: Secondary | ICD-10-CM | POA: Diagnosis not present

## 2022-10-11 DIAGNOSIS — G319 Degenerative disease of nervous system, unspecified: Secondary | ICD-10-CM | POA: Diagnosis not present

## 2022-10-11 DIAGNOSIS — I5032 Chronic diastolic (congestive) heart failure: Secondary | ICD-10-CM | POA: Diagnosis not present

## 2022-10-11 DIAGNOSIS — E1165 Type 2 diabetes mellitus with hyperglycemia: Secondary | ICD-10-CM | POA: Diagnosis not present

## 2022-10-11 DIAGNOSIS — I700 Atherosclerosis of aorta: Secondary | ICD-10-CM | POA: Diagnosis not present

## 2022-10-11 DIAGNOSIS — R413 Other amnesia: Secondary | ICD-10-CM | POA: Diagnosis not present

## 2022-10-16 DIAGNOSIS — G5601 Carpal tunnel syndrome, right upper limb: Secondary | ICD-10-CM | POA: Diagnosis not present

## 2022-12-06 ENCOUNTER — Ambulatory Visit: Payer: Medicare PPO | Admitting: Neurology

## 2022-12-06 ENCOUNTER — Encounter: Payer: Self-pay | Admitting: Neurology

## 2022-12-06 VITALS — BP 151/77 | HR 77 | Ht 64.0 in | Wt 151.0 lb

## 2022-12-06 DIAGNOSIS — G3184 Mild cognitive impairment, so stated: Secondary | ICD-10-CM | POA: Diagnosis not present

## 2022-12-06 NOTE — Patient Instructions (Addendum)
Dementia lab, ATN today  Continue current medications Continue to follow up with PCP  Follow up in a year or sooner if worse    There are well-accepted and sensible ways to reduce risk for Alzheimers disease and other degenerative brain disorders .  Exercise Daily Walk A daily 20 minute walk should be part of your routine. Disease related apathy can be a significant roadblock to exercise and the only way to overcome this is to make it a daily routine and perhaps have a reward at the end (something your loved one loves to eat or drink perhaps) or a personal trainer coming to the home can also be very useful. Most importantly, the patient is much more likely to exercise if the caregiver / spouse does it with him/her. In general a structured, repetitive schedule is best.  General Health: Any diseases which effect your body will effect your brain such as a pneumonia, urinary infection, blood clot, heart attack or stroke. Keep contact with your primary care doctor for regular follow ups.  Sleep. A good nights sleep is healthy for the brain. Seven hours is recommended. If you have insomnia or poor sleep habits we can give you some instructions. If you have sleep apnea wear your mask.  Diet: Eating a heart healthy diet is also a good idea; fish and poultry instead of red meat, nuts (mostly non-peanuts), vegetables, fruits, olive oil or canola oil (instead of butter), minimal salt (use other spices to flavor foods), whole grain rice, bread, cereal and pasta and wine in moderation.Research is now showing that the MIND diet, which is a combination of The Mediterranean diet and the DASH diet, is beneficial for cognitive processing and longevity. Information about this diet can be found in The MIND Diet, a book by Alonna Minium, MS, RDN, and online at [employer]  Finances, Power of [address] and Advance Directives: You should consider putting legal safeguards in place with regard  to financial and medical decision making. While the spouse always has power of attorney for medical and financial issues in the absence of any form, you should consider what you want in case the spouse / caregiver is no longer around or capable of making decisions.
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
## 2022-12-11 LAB — ATN PROFILE
A -- Beta-amyloid 42/40 Ratio: 0.097 — ABNORMAL LOW (ref >0.102)
Beta-amyloid 40: 231.86 pg/mL
Beta-amyloid 42: 22.49 pg/mL
N -- NfL, Plasma: 3.91 pg/mL (ref 0.00–7.64)
T -- p-tau181: 1.26 pg/mL — ABNORMAL HIGH (ref 0.00–0.97)

## 2022-12-17 ENCOUNTER — Telehealth: Payer: Self-pay | Admitting: Neurology

## 2022-12-17 MED ORDER — DONEPEZIL HCL 5 MG PO TABS: 5.0000 mg | ORAL_TABLET | Freq: Every day | ORAL | 0 refills | Status: DC

## 2022-12-17 NOTE — Addendum Note (Signed)
Addended byWindell Norfolk on: 12/17/2022 12:23 PM   Modules accepted: Orders

## 2022-12-17 NOTE — Progress Notes (Signed)
Spoke with patient, informed that her ATN profile with consistent with presence of Alzheimer disease biomarker. 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 We will try her on low dose Aricept. Discussed side effect of the medication including diarrhea, vivid dream and dizziness. She voices understanding.   Dr. [name]

## 2022-12-17 NOTE — Telephone Encounter (Signed)
Pt is asking for a call with results to blood work 

## 2022-12-17 NOTE — Telephone Encounter (Signed)
Spoke with patient, discussed positive ATN profile, will start her on Aricept 5 mg nightly.

## 2022-12-19 NOTE — Telephone Encounter (Signed)
Called pt LVM  that's PCP have access to lab results.

## 2022-12-19 NOTE — Telephone Encounter (Signed)
Pt asking if her lab results can be sent to her PCP Dr. [name].

## 2022-12-27 DIAGNOSIS — F02A0 Dementia in other diseases classified elsewhere, mild, without behavioral disturbance, psychotic disturbance, mood disturbance, and anxiety: Secondary | ICD-10-CM | POA: Diagnosis not present

## 2022-12-27 DIAGNOSIS — G300 Alzheimer's disease with early onset: Secondary | ICD-10-CM | POA: Diagnosis not present

## 2022-12-27 DIAGNOSIS — R0989 Other specified symptoms and signs involving the circulatory and respiratory systems: Secondary | ICD-10-CM | POA: Diagnosis not present

## 2023-01-02 DIAGNOSIS — K529 Noninfective gastroenteritis and colitis, unspecified: Secondary | ICD-10-CM | POA: Diagnosis not present

## 2023-01-08 ENCOUNTER — Telehealth: Payer: Self-pay | Admitting: Neurology

## 2023-01-08 NOTE — Telephone Encounter (Signed)
Doresa called from [facility]. Requesting office notes from pt visit, please fax to [phone]

## 2023-01-08 NOTE — Telephone Encounter (Signed)
Faxed to [facility]

## 2023-01-09 ENCOUNTER — Telehealth: Payer: Self-pay | Admitting: Neurology

## 2023-01-11 ENCOUNTER — Other Ambulatory Visit: Payer: Self-pay | Admitting: Neurology

## 2023-01-14 NOTE — Telephone Encounter (Signed)
error 

## 2023-01-16 ENCOUNTER — Telehealth: Payer: Self-pay | Admitting: Neurology

## 2023-01-16 NOTE — Telephone Encounter (Signed)
Please add the patient to the cancellation list. I will have to schedule a visit with her. Thanks

## 2023-01-16 NOTE — Telephone Encounter (Signed)
Informed pt have been added to the waitlist and ave her help desk number for MyChart.

## 2023-01-16 NOTE — Telephone Encounter (Signed)
Pt came into office, requested an appt with Dr. [name] pt stating she has a lot of questions after her last appt in May. She feels she is needing the diagnosis explained to her and would like to speak to Dr. [name] directly. Made an appt for his next available 06/10/2023. Pt is also requesting any books or informational websites where she can read up on Alzheimer's. Would like a call to discuss.

## 2023-01-23 DIAGNOSIS — R413 Other amnesia: Secondary | ICD-10-CM | POA: Diagnosis not present

## 2023-01-23 DIAGNOSIS — I10 Essential (primary) hypertension: Secondary | ICD-10-CM | POA: Diagnosis not present

## 2023-02-08 DIAGNOSIS — G5603 Carpal tunnel syndrome, bilateral upper limbs: Secondary | ICD-10-CM | POA: Diagnosis not present

## 2023-02-19 DIAGNOSIS — G5602 Carpal tunnel syndrome, left upper limb: Secondary | ICD-10-CM | POA: Diagnosis not present

## 2023-02-28 DIAGNOSIS — S30861A Insect bite (nonvenomous) of abdominal wall, initial encounter: Secondary | ICD-10-CM | POA: Diagnosis not present

## 2023-02-28 DIAGNOSIS — R413 Other amnesia: Secondary | ICD-10-CM | POA: Diagnosis not present

## 2023-02-28 DIAGNOSIS — W57XXXA Bitten or stung by nonvenomous insect and other nonvenomous arthropods, initial encounter: Secondary | ICD-10-CM | POA: Diagnosis not present

## 2023-02-28 DIAGNOSIS — I10 Essential (primary) hypertension: Secondary | ICD-10-CM | POA: Diagnosis not present

## 2023-03-03 ENCOUNTER — Other Ambulatory Visit: Payer: Self-pay | Admitting: Cardiovascular Disease

## 2023-03-05 DIAGNOSIS — G5602 Carpal tunnel syndrome, left upper limb: Secondary | ICD-10-CM | POA: Diagnosis not present

## 2023-03-07 ENCOUNTER — Other Ambulatory Visit: Payer: Self-pay

## 2023-03-07 ENCOUNTER — Emergency Department (HOSPITAL_COMMUNITY): Payer: Medicare PPO

## 2023-03-07 ENCOUNTER — Inpatient Hospital Stay (HOSPITAL_COMMUNITY): Payer: Medicare PPO

## 2023-03-07 ENCOUNTER — Encounter (HOSPITAL_COMMUNITY): Payer: Self-pay

## 2023-03-07 ENCOUNTER — Inpatient Hospital Stay (HOSPITAL_COMMUNITY)
Admission: EM | Admit: 2023-03-07 | Discharge: 2023-03-09 | DRG: 641 | Disposition: A | Discharge status: Home / Self Care | Payer: Medicare PPO | Attending: Internal Medicine | Admitting: Internal Medicine

## 2023-03-07 DIAGNOSIS — Z6825 Body mass index (BMI) 25.0-25.9, adult: Secondary | ICD-10-CM | POA: Diagnosis not present

## 2023-03-07 DIAGNOSIS — G4700 Insomnia, unspecified: Secondary | ICD-10-CM | POA: Diagnosis present

## 2023-03-07 DIAGNOSIS — E663 Overweight: Secondary | ICD-10-CM | POA: Diagnosis present

## 2023-03-07 DIAGNOSIS — E039 Hypothyroidism, unspecified: Secondary | ICD-10-CM | POA: Diagnosis present

## 2023-03-07 DIAGNOSIS — J45909 Unspecified asthma, uncomplicated: Secondary | ICD-10-CM | POA: Diagnosis present

## 2023-03-07 DIAGNOSIS — E861 Hypovolemia: Secondary | ICD-10-CM | POA: Diagnosis present

## 2023-03-07 DIAGNOSIS — R4182 Altered mental status, unspecified: Secondary | ICD-10-CM

## 2023-03-07 DIAGNOSIS — K5190 Ulcerative colitis, unspecified, without complications: Secondary | ICD-10-CM | POA: Diagnosis not present

## 2023-03-07 DIAGNOSIS — K219 Gastro-esophageal reflux disease without esophagitis: Secondary | ICD-10-CM | POA: Diagnosis present

## 2023-03-07 DIAGNOSIS — E119 Type 2 diabetes mellitus without complications: Secondary | ICD-10-CM | POA: Diagnosis not present

## 2023-03-07 DIAGNOSIS — Z79899 Other long term (current) drug therapy: Secondary | ICD-10-CM | POA: Diagnosis not present

## 2023-03-07 DIAGNOSIS — R569 Unspecified convulsions: Secondary | ICD-10-CM

## 2023-03-07 DIAGNOSIS — Z888 Allergy status to other drugs, medicaments and biological substances status: Secondary | ICD-10-CM | POA: Diagnosis not present

## 2023-03-07 DIAGNOSIS — Z881 Allergy status to other antibiotic agents status: Secondary | ICD-10-CM

## 2023-03-07 DIAGNOSIS — Z8249 Family history of ischemic heart disease and other diseases of the circulatory system: Secondary | ICD-10-CM | POA: Diagnosis not present

## 2023-03-07 DIAGNOSIS — F419 Anxiety disorder, unspecified: Secondary | ICD-10-CM | POA: Diagnosis present

## 2023-03-07 DIAGNOSIS — Z91013 Allergy to seafood: Secondary | ICD-10-CM | POA: Diagnosis not present

## 2023-03-07 DIAGNOSIS — R4701 Aphasia: Secondary | ICD-10-CM | POA: Diagnosis present

## 2023-03-07 DIAGNOSIS — R479 Unspecified speech disturbances: Secondary | ICD-10-CM | POA: Diagnosis not present

## 2023-03-07 DIAGNOSIS — R112 Nausea with vomiting, unspecified: Secondary | ICD-10-CM | POA: Diagnosis not present

## 2023-03-07 DIAGNOSIS — E7800 Pure hypercholesterolemia, unspecified: Secondary | ICD-10-CM | POA: Diagnosis present

## 2023-03-07 DIAGNOSIS — I639 Cerebral infarction, unspecified: Secondary | ICD-10-CM | POA: Diagnosis not present

## 2023-03-07 DIAGNOSIS — L719 Rosacea, unspecified: Secondary | ICD-10-CM | POA: Diagnosis present

## 2023-03-07 DIAGNOSIS — I110 Hypertensive heart disease with heart failure: Secondary | ICD-10-CM | POA: Diagnosis present

## 2023-03-07 DIAGNOSIS — R531 Weakness: Secondary | ICD-10-CM

## 2023-03-07 DIAGNOSIS — G9341 Metabolic encephalopathy: Secondary | ICD-10-CM

## 2023-03-07 DIAGNOSIS — G459 Transient cerebral ischemic attack, unspecified: Secondary | ICD-10-CM | POA: Diagnosis present

## 2023-03-07 DIAGNOSIS — Z7984 Long term (current) use of oral hypoglycemic drugs: Secondary | ICD-10-CM | POA: Diagnosis not present

## 2023-03-07 DIAGNOSIS — R0602 Shortness of breath: Secondary | ICD-10-CM | POA: Diagnosis not present

## 2023-03-07 DIAGNOSIS — I6782 Cerebral ischemia: Secondary | ICD-10-CM | POA: Diagnosis not present

## 2023-03-07 DIAGNOSIS — I10 Essential (primary) hypertension: Secondary | ICD-10-CM | POA: Diagnosis not present

## 2023-03-07 DIAGNOSIS — I5032 Chronic diastolic (congestive) heart failure: Secondary | ICD-10-CM | POA: Diagnosis present

## 2023-03-07 DIAGNOSIS — R0789 Other chest pain: Secondary | ICD-10-CM | POA: Diagnosis not present

## 2023-03-07 DIAGNOSIS — I771 Stricture of artery: Secondary | ICD-10-CM | POA: Diagnosis not present

## 2023-03-07 DIAGNOSIS — I6521 Occlusion and stenosis of right carotid artery: Secondary | ICD-10-CM | POA: Diagnosis not present

## 2023-03-07 DIAGNOSIS — I672 Cerebral atherosclerosis: Secondary | ICD-10-CM | POA: Diagnosis not present

## 2023-03-07 DIAGNOSIS — Z79890 Hormone replacement therapy (postmenopausal): Secondary | ICD-10-CM | POA: Diagnosis not present

## 2023-03-07 DIAGNOSIS — E871 Hypo-osmolality and hyponatremia: Secondary | ICD-10-CM | POA: Diagnosis not present

## 2023-03-07 DIAGNOSIS — R1111 Vomiting without nausea: Secondary | ICD-10-CM | POA: Diagnosis not present

## 2023-03-07 DIAGNOSIS — R0902 Hypoxemia: Secondary | ICD-10-CM | POA: Diagnosis not present

## 2023-03-07 DIAGNOSIS — E1165 Type 2 diabetes mellitus with hyperglycemia: Secondary | ICD-10-CM | POA: Diagnosis present

## 2023-03-07 LAB — I-STAT CHEM 8, ED
BUN: 10 mg/dL (ref 8–23)
Calcium, Ion: 1.03 mmol/L — ABNORMAL LOW (ref 1.15–1.40)
Chloride: 84 mmol/L — ABNORMAL LOW (ref 98–111)
Creatinine, Ser: 0.5 mg/dL (ref 0.44–1.00)
Glucose, Bld: 201 mg/dL — ABNORMAL HIGH (ref 70–99)
HCT: 43 % (ref 36.0–46.0)
Hemoglobin: 14.6 g/dL (ref 12.0–15.0)
Potassium: 4.2 mmol/L (ref 3.5–5.1)
Sodium: 119 mmol/L — CL (ref 135–145)
TCO2: 23 mmol/L (ref 22–32)

## 2023-03-07 LAB — PROTIME-INR
INR: 1.1 (ref 0.8–1.2)
Prothrombin Time: 14.8 seconds (ref 11.4–15.2)

## 2023-03-07 LAB — ECHOCARDIOGRAM COMPLETE
Area-P 1/2: 3.01 cm2
Height: 64 in
S' Lateral: 2.6 cm
Weight: 2368 oz

## 2023-03-07 LAB — CBG MONITORING, ED
Glucose-Capillary: 202 mg/dL — ABNORMAL HIGH (ref 70–99)
Glucose-Capillary: 174 mg/dL — ABNORMAL HIGH (ref 70–99)

## 2023-03-07 LAB — GLUCOSE, CAPILLARY: Glucose-Capillary: 141 mg/dL — ABNORMAL HIGH (ref 70–99)

## 2023-03-07 LAB — URINALYSIS, ROUTINE W REFLEX MICROSCOPIC
Bilirubin Urine: NEGATIVE
Glucose, UA: 150 mg/dL — AB
Hgb urine dipstick: NEGATIVE
Ketones, ur: 20 mg/dL — AB
Leukocytes,Ua: NEGATIVE
Nitrite: NEGATIVE
Protein, ur: NEGATIVE mg/dL
Specific Gravity, Urine: 1.017 (ref 1.005–1.030)
pH: 7 (ref 5.0–8.0)

## 2023-03-07 LAB — LIPID PANEL
Cholesterol: 128 mg/dL (ref 0–200)
HDL: 53 mg/dL (ref >40)
LDL Cholesterol: 62 mg/dL (ref 0–99)
Total CHOL/HDL Ratio: 2.4 RATIO
Triglycerides: 64 mg/dL (ref <150)
VLDL: 13 mg/dL (ref 0–40)

## 2023-03-07 LAB — TROPONIN I (HIGH SENSITIVITY)
Troponin I (High Sensitivity): 21 ng/L — ABNORMAL HIGH (ref <18)
Troponin I (High Sensitivity): 27 ng/L — ABNORMAL HIGH (ref <18)

## 2023-03-07 LAB — CBC
HCT: 37.7 % (ref 36.0–46.0)
Hemoglobin: 13.8 g/dL (ref 12.0–15.0)
MCH: 32 pg (ref 26.0–34.0)
MCHC: 36.6 g/dL — ABNORMAL HIGH (ref 30.0–36.0)
MCV: 87.5 fL (ref 80.0–100.0)
Platelets: 261 10*3/uL (ref 150–400)
RBC: 4.31 MIL/uL (ref 3.87–5.11)
RDW: 12 % (ref 11.5–15.5)
WBC: 7.8 10*3/uL (ref 4.0–10.5)
nRBC: 0 % (ref 0.0–0.2)

## 2023-03-07 LAB — BASIC METABOLIC PANEL
Anion gap: 18 — ABNORMAL HIGH (ref 5–15)
Anion gap: 14 (ref 5–15)
Anion gap: 12 (ref 5–15)
Anion gap: 11 (ref 5–15)
BUN: 9 mg/dL (ref 8–23)
BUN: 8 mg/dL (ref 8–23)
BUN: 10 mg/dL (ref 8–23)
BUN: 15 mg/dL (ref 8–23)
CO2: 20 mmol/L — ABNORMAL LOW (ref 22–32)
CO2: 20 mmol/L — ABNORMAL LOW (ref 22–32)
CO2: 25 mmol/L (ref 22–32)
CO2: 24 mmol/L (ref 22–32)
Calcium: 9.5 mg/dL (ref 8.9–10.3)
Calcium: 9.1 mg/dL (ref 8.9–10.3)
Calcium: 9.2 mg/dL (ref 8.9–10.3)
Calcium: 9.1 mg/dL (ref 8.9–10.3)
Chloride: 82 mmol/L — ABNORMAL LOW (ref 98–111)
Chloride: 89 mmol/L — ABNORMAL LOW (ref 98–111)
Chloride: 86 mmol/L — ABNORMAL LOW (ref 98–111)
Chloride: 89 mmol/L — ABNORMAL LOW (ref 98–111)
Creatinine, Ser: 0.69 mg/dL (ref 0.44–1.00)
Creatinine, Ser: 0.66 mg/dL (ref 0.44–1.00)
Creatinine, Ser: 1.11 mg/dL — ABNORMAL HIGH (ref 0.44–1.00)
Creatinine, Ser: 1.03 mg/dL — ABNORMAL HIGH (ref 0.44–1.00)
GFR, Estimated: >60 mL/min (ref >60)
GFR, Estimated: >60 mL/min (ref >60)
GFR, Estimated: 51 mL/min — ABNORMAL LOW (ref >60)
GFR, Estimated: 56 mL/min — ABNORMAL LOW (ref >60)
Glucose, Bld: 194 mg/dL — ABNORMAL HIGH (ref 70–99)
Glucose, Bld: 179 mg/dL — ABNORMAL HIGH (ref 70–99)
Glucose, Bld: 169 mg/dL — ABNORMAL HIGH (ref 70–99)
Glucose, Bld: 162 mg/dL — ABNORMAL HIGH (ref 70–99)
Potassium: 4.1 mmol/L (ref 3.5–5.1)
Potassium: 3.5 mmol/L (ref 3.5–5.1)
Potassium: 3.6 mmol/L (ref 3.5–5.1)
Potassium: 3.7 mmol/L (ref 3.5–5.1)
Sodium: 120 mmol/L — ABNORMAL LOW (ref 135–145)
Sodium: 123 mmol/L — ABNORMAL LOW (ref 135–145)
Sodium: 123 mmol/L — ABNORMAL LOW (ref 135–145)
Sodium: 124 mmol/L — ABNORMAL LOW (ref 135–145)

## 2023-03-07 LAB — DIFFERENTIAL
Abs Immature Granulocytes: 0.03 10*3/uL (ref 0.00–0.07)
Basophils Absolute: 0 10*3/uL (ref 0.0–0.1)
Basophils Relative: 1 %
Eosinophils Absolute: 0 10*3/uL (ref 0.0–0.5)
Eosinophils Relative: 0 %
Immature Granulocytes: 0 %
Lymphocytes Relative: 7 %
Lymphs Abs: 0.5 10*3/uL — ABNORMAL LOW (ref 0.7–4.0)
Monocytes Absolute: 0.3 10*3/uL (ref 0.1–1.0)
Monocytes Relative: 4 %
Neutro Abs: 6.9 10*3/uL (ref 1.7–7.7)
Neutrophils Relative %: 88 %

## 2023-03-07 LAB — RAPID URINE DRUG SCREEN, HOSP PERFORMED
Amphetamines: NOT DETECTED
Barbiturates: NOT DETECTED
Benzodiazepines: NOT DETECTED
Cocaine: NOT DETECTED
Opiates: NOT DETECTED
Tetrahydrocannabinol: NOT DETECTED

## 2023-03-07 LAB — HEMOGLOBIN A1C
Hgb A1c MFr Bld: 6.7 % — ABNORMAL HIGH (ref 4.8–5.6)
Mean Plasma Glucose: 145.59 mg/dL

## 2023-03-07 LAB — OSMOLALITY: Osmolality: 267 mOsm/kg — ABNORMAL LOW (ref 275–295)

## 2023-03-07 LAB — TSH: TSH: 3.298 u[IU]/mL (ref 0.350–4.500)

## 2023-03-07 LAB — APTT: aPTT: 28 seconds (ref 24–36)

## 2023-03-07 LAB — ETHANOL: Alcohol, Ethyl (B): <10 mg/dL (ref <10)

## 2023-03-07 MED ORDER — ACETAMINOPHEN 325 MG PO TABS: 650.0000 mg | ORAL_TABLET | ORAL | Status: DC | PRN

## 2023-03-07 MED ORDER — ASPIRIN 325 MG PO TBEC
650.0000 mg | DELAYED_RELEASE_TABLET | Freq: Once | ORAL | Status: AC
  Administered 2023-03-07: 650 mg via ORAL
  Filled 2023-03-07: qty 2

## 2023-03-07 MED ORDER — PROCHLORPERAZINE EDISYLATE 10 MG/2ML IJ SOLN
5.0000 mg | Freq: Once | INTRAMUSCULAR | Status: AC
  Administered 2023-03-07: 5 mg via INTRAVENOUS
  Filled 2023-03-07: qty 2

## 2023-03-07 MED ORDER — DIPHENHYDRAMINE HCL 50 MG/ML IJ SOLN
12.5000 mg | Freq: Once | INTRAMUSCULAR | Status: AC
  Administered 2023-03-07: 12.5 mg via INTRAVENOUS
  Filled 2023-03-07: qty 1

## 2023-03-07 MED ORDER — SODIUM CHLORIDE 0.9 % IV BOLUS
1000.0000 mL | Freq: Once | INTRAVENOUS | Status: AC
  Administered 2023-03-07: 1000 mL via INTRAVENOUS

## 2023-03-07 MED ORDER — ASPIRIN 81 MG PO TBEC
81.0000 mg | DELAYED_RELEASE_TABLET | Freq: Every day | ORAL | Status: DC
  Administered 2023-03-08 – 2023-03-09 (×2): 81 mg via ORAL
  Filled 2023-03-07 (×2): qty 1

## 2023-03-07 MED ORDER — SODIUM CHLORIDE 0.9 % IV SOLN: INTRAVENOUS | Status: DC

## 2023-03-07 MED ORDER — ENOXAPARIN SODIUM 40 MG/0.4ML IJ SOSY
40.0000 mg | PREFILLED_SYRINGE | Freq: Every day | INTRAMUSCULAR | Status: DC
  Administered 2023-03-07 – 2023-03-09 (×3): 40 mg via SUBCUTANEOUS
  Filled 2023-03-07 (×3): qty 0.4

## 2023-03-07 MED ORDER — IOHEXOL 350 MG/ML SOLN
75.0000 mL | Freq: Once | INTRAVENOUS | Status: AC | PRN
  Administered 2023-03-07: 75 mL via INTRAVENOUS

## 2023-03-07 MED ORDER — ACETAMINOPHEN 650 MG RE SUPP: 650.0000 mg | RECTAL | Status: DC | PRN

## 2023-03-07 MED ORDER — INSULIN ASPART 100 UNIT/ML IJ SOLN: 0.0000 [IU] | Freq: Every day | INTRAMUSCULAR | Status: DC

## 2023-03-07 MED ORDER — STROKE: EARLY STAGES OF RECOVERY BOOK
Freq: Once | Status: DC
  Filled 2023-03-07: qty 1

## 2023-03-07 MED ORDER — SENNOSIDES-DOCUSATE SODIUM 8.6-50 MG PO TABS: 1.0000 | ORAL_TABLET | Freq: Every evening | ORAL | Status: DC | PRN

## 2023-03-07 MED ORDER — INSULIN ASPART 100 UNIT/ML IJ SOLN
0.0000 [IU] | Freq: Three times a day (TID) | INTRAMUSCULAR | Status: DC
  Administered 2023-03-07: 1 [IU] via SUBCUTANEOUS
  Administered 2023-03-07: 3 [IU] via SUBCUTANEOUS
  Administered 2023-03-08 (×2): 1 [IU] via SUBCUTANEOUS
  Administered 2023-03-09: 5 [IU] via SUBCUTANEOUS

## 2023-03-07 MED ORDER — ACETAMINOPHEN 160 MG/5ML PO SOLN: 650.0000 mg | ORAL | Status: DC | PRN

## 2023-03-07 MED ORDER — ONDANSETRON HCL 4 MG/2ML IJ SOLN
4.0000 mg | Freq: Once | INTRAMUSCULAR | Status: AC
  Administered 2023-03-07: 4 mg via INTRAVENOUS
  Filled 2023-03-07: qty 2

## 2023-03-07 MED ORDER — SIMVASTATIN 20 MG PO TABS
20.0000 mg | ORAL_TABLET | Freq: Every day | ORAL | Status: DC
  Administered 2023-03-07 – 2023-03-08 (×2): 20 mg via ORAL
  Filled 2023-03-07 (×2): qty 1

## 2023-03-07 NOTE — Consult Note (Addendum)
NEURO HOSPITALIST CONSULT NOTE   Requesting physician: Dr. Adela Lank  Reason for Consult: Aphasia  History obtained from:  RN and Chart     HPI:                                                                                                                                          Kristina [name] is a 77 y.o. female with a PMHx of asthma, DM, HTN, hypercholesterolemia, rosacea, thyroid disease and ulcerative colitis who presents to the ED from home with a c/c of diffuse weakness, chest tightness and impaired speech. Symptoms began at midnight. She had called EMS earlier for weakness as well as nausea with vomiting, was seen by them, and then called EMS back again when her speech became abnormal. BP with EMS was 158/121. Code Stroke was not called in the field. On arrival to the ED, intermittent garbled and nonsensical speech was noted, resulting in Code Stroke being called at 4:35 AM. ED vitals: BP 162/106, HR 95, 98% on RA, CBG 237.    Past Medical History:  Diagnosis Date   Anxiety    Asthma    Diabetes (HCC)    Diabetes mellitus without complication (HCC)    Diverticulitis    GERD (gastroesophageal reflux disease)    HTN (hypertension)    Hypercholesteremia    Hyperlipidemia    Hypertension    Obesity    Rosacea    Thyroid disease    Ulcerative colitis (HCC)     Past Surgical History:  Procedure Laterality Date   ACHILLES TENDON REPAIR     APPENDECTOMY     BIOPSY  10/16/2018   Procedure: BIOPSY;  Surgeon: Kathi Der, MD;  Location: [facility];  Service: Gastroenterology;;   CESAREAN SECTION     CHOLECYSTECTOMY     FLEXIBLE SIGMOIDOSCOPY N/A 10/16/2018   Procedure: FLEXIBLE SIGMOIDOSCOPY;  Surgeon: Kathi Der, MD;  Location: [facility];  Service: Gastroenterology;  Laterality: N/A;   hammertoe      Family History  Problem Relation Age of Onset   Hypertension Mother    Heart failure Mother    Heart attack Mother    Hypotension Father     Dementia Father              Social History:  reports that she has never smoked. She has never used smokeless tobacco. She reports that she does not drink alcohol and does not use drugs.  Allergies  Allergen Reactions   Ace Inhibitors Cough   Ciprofloxacin Other (See Comments)    Tendinitis in heel and posterior right LE  Tendon rupture  Other Reaction(s): achiles tendon issue   Doxycycline Other (See Comments)    Pt does not remember what the reaction was - many years ago  Doxycycline Hyclate     Other Reaction(s): unknown   Shellfish-Derived Products     Other Reaction(s): vomiting, diarrhea, purple spots   Shrimp [Shellfish Allergy] Diarrhea, Nausea And Vomiting and Rash    HOME MEDICATIONS:                                                                                                                      No current facility-administered medications on file prior to encounter.   Current Outpatient Medications on File Prior to Encounter  Medication Sig Dispense Refill   acetaminophen (TYLENOL) 500 MG tablet Take 1,000 mg by mouth 2 (two) times daily as needed (pain).     CALCIUM PO Take 500 mg by mouth at bedtime.     cyanocobalamin (VITAMIN B12) 1000 MCG tablet Take 1,000 mcg by mouth daily.     donepezil (ARICEPT) 5 MG tablet TAKE 1 TABLET BY MOUTH EVERYDAY AT BEDTIME 90 tablet 1   furosemide (LASIX) 20 MG tablet TAKE 1 TABLET BY MOUTH EVERY DAY 90 tablet 0   hyoscyamine (LEVSIN) 0.125 MG tablet Take 0.125 mg by mouth 2 (two) times daily as needed for cramping.     levothyroxine (SYNTHROID) 50 MCG tablet Take 50 mcg by mouth every morning.     mesalamine (APRISO) 0.375 g 24 hr capsule Take 4 capsules by mouth daily.     metFORMIN (GLUCOPHAGE-XR) 500 MG 24 hr tablet Take 3 tablets (1,500 mg total) by mouth daily.  0   nebivolol (BYSTOLIC) 10 MG tablet Take 1 tablet (10 mg total) by mouth daily. 30 tablet 3   Probiotic Product (ALIGN PO) Take 1 tablet by mouth daily.      simethicone (MYLICON) 125 MG chewable tablet Chew 125 mg by mouth every 6 (six) hours as needed for flatulence.     simvastatin (ZOCOR) 20 MG tablet Take 20 mg by mouth every evening.      valsartan (DIOVAN) 160 MG tablet Take 1 tablet (160 mg total) by mouth daily. 30 tablet 11     ROS:                                                                                                                                       Does not endorse any limb weakness, vision loss, ataxia or numbness. Other ROS ss per HPI.    Blood pressure (!) 158/121, pulse 94, temperature 98 F (  36.7 C), resp. rate 20, height 5\' 4"  (1.626 m), weight 67.1 kg, SpO2 99%.   General Examination:                                                                                                       Physical Exam  Physical Exam  HEENT-  [facility]/AT    Lungs- Respirations unlabored Extremities- No edema   Neurological Examination Mental Status: Alert, oriented x 5, thought content appropriate.  Speech varies between fluent and dysfluent with halting quality, word-finding difficulty and paraphasias. Comprehension intact. Able to follow all commands without difficulty.  Cranial Nerves: II: Temporal visual fields intact with no extinction to DSS. PERRL  III,IV, VI: No ptosis. EOMI.  V: Temp sensation equal bilaterally  VII: Smile symmetric VIII: Hearing intact to voice IX,X: No hoarseness XI: Symmetric shoulder shrug XII: Midline tongue extension Motor: BUE 5/5 proximally and distally BLE 5/5 proximally and distally  No pronator drift.  Sensory: Temp and light touch intact throughout, bilaterally. No extinction to DSS.  Deep Tendon Reflexes: 2+ and symmetric throughout Cerebellar: No ataxia with FNF bilaterally  Gait: Deferred  NIHSS: 1   Lab Results: Basic Metabolic Panel: Recent Labs  Lab 03/07/23 0435  NA 119*  K 4.2  CL 84*  GLUCOSE 201*  BUN 10  CREATININE 0.50    CBC: Recent Labs  Lab  03/07/23 0428 03/07/23 0435  WBC 7.8  --   NEUTROABS 6.9  --   HGB 13.8 14.6  HCT 37.7 43.0  MCV 87.5  --   PLT 261  --     Cardiac Enzymes: No results for input(s): "CKTOTAL", "CKMB", "CKMBINDEX", "TROPONINI" in the last 168 hours.  Lipid Panel: No results for input(s): "CHOL", "TRIG", "HDL", "CHOLHDL", "VLDL", "LDLCALC" in the last 168 hours.  Imaging: CT HEAD CODE STROKE WO CONTRAST  Result Date: 03/07/2023 CLINICAL DATA:  Code stroke. 77 year old female. Vomiting and abnormal speech. EXAM: CT HEAD WITHOUT CONTRAST TECHNIQUE: Contiguous axial images were obtained from the base of the skull through the vertex without intravenous contrast. RADIATION DOSE REDUCTION: This exam was performed according to the departmental dose-optimization program which includes automated exposure control, adjustment of the mA and/or kV according to patient size and/or use of iterative reconstruction technique. COMPARISON:  Head CT 02/22/2022. FINDINGS: Brain: Intermittent motion artifact. Cerebral volume appears stable from last year. No midline shift, mass effect, or evidence of intracranial mass lesion. No ventriculomegaly. No acute intracranial hemorrhage identified. Patchy bilateral cerebral white matter hypodensity appears grossly stable. No acute cortically based infarct is evident. Basilar cisterns appear normal. Vascular: No suspicious intracranial vascular hyperdensity. Calcified atherosclerosis at the skull base. Skull: Intermittent motion artifact. No acute osseous abnormality identified. Sinuses/Orbits: Visualized paranasal sinuses and mastoids are stable and well aerated. Other: Rightward gaze similar to the prior exam. No other acute orbit or scalp soft tissue finding. ASPECTS [facility] Stroke Program Early CT Score) Total score (0-10 with 10 being normal): 10 IMPRESSION: 1. Mildly motion degraded exam. No acute cortically based infarct or acute intracranial hemorrhage identified. ASPECTS 10. 2. These  results were communicated to Dr. Otelia Limes at 4:47 am on 03/07/2023 by text page via the [facility] messaging system. Electronically Signed   By: Odessa Fleming M.D.   On: 03/07/2023 04:48     Assessment: 77 y.o. female with a PMHx of asthma, DM, HTN, hypercholesterolemia, rosacea, thyroid disease and ulcerative colitis who presents to the ED from home with a c/c of diffuse weakness, chest tightness and impaired speech. Symptoms began at midnight. She had called EMS earlier for weakness as well as nausea with vomiting, was seen by them, and then called EMS back again when her speech became abnormal. BP with EMS was 158/121. Code Stroke was not called in the field. On arrival to the ED, intermittent garbled and nonsensical speech was noted, resulting in Code Stroke being called at 4:35 AM.  - On exam the patient is alert and fully oriented. Speech varies between fluent and dysfluent with halting quality, word-finding difficulty and paraphasias. No dysarthria. Comprehension intact. Able to follow all commands without difficulty. No other abnormalities noted on neurological exam.  - CT head: Mildly motion degraded exam. No acute cortically based infarct or acute intracranial hemorrhage identified. ASPECTS 10 - CTA head and neck:  Negative for large vessel occlusion. Mild for age atherosclerosis, especially in the neck. Calcified ICA siphon plaque with no significant stenosis. Calcified plaque of both distal vertebral arteries with up to Moderate Right and mild left V4 vertebral stenosis. - Out of the time window for TNK.  - Labs reveal hyponatremia with Na of 119.  - Overall presentation is most consistent with an acute ischemic stroke involving the left perisylvian cortices.  - Stroke risk factors: DM, HTN and hypercholesterolemia.   Recommendations: - Oral ASA load of 650 mg crushed ASA x 1 now if she passes bedside swallow evaluation. Otherwise, 600 mg ASA PR.  - Start on DAPT tomorrow with ASA 81 mg and Plavix 75 mg  po every day. Continue for 21 days, then discontinue Plavix and continue ASA monotherapy indefinitely.  - MRI brain - TTE - Cardiac telemetry - HgbA1c, fasting lipid panel - PT consult, OT consult, Speech consult - Continue simvastatin. Obtain a CK level.  - Modified permissive HTN protocol given her advanced age: Treat SBP if > 180. After 24 hours, gradually normalize to a goal of 120/80.   - Risk factor modification - Frequent neuro checks     Electronically signed: Dr. Caryl Pina 03/07/2023, 5:06 AM

## 2023-03-07 NOTE — ED Provider Notes (Signed)
[place] EMERGENCY DEPARTMENT AT [facility] Provider Note   CSN: [id number] Arrival date & time: 03/07/23  0413     History  Chief Complaint  Patient presents with   Weakness    Kristina [name] is a 77 y.o. female.  77 yo F with a chief complaints of fatigue.  This was reported by EMS.  When I try to have a discussion with her she is having a lot of trouble expressing herself.  She tells me that earlier today she ended up having the hiccups and then noted vomiting.  She had EMS come out to see her and they decided together to not bring her to the hospital.  Sometime later she had developed what she thinks is difficulty breathing and EMS was called.  It was noticed when they picked her up that she was having trouble finding the right words to say.   Weakness      Home Medications Prior to Admission medications   Medication Sig Start Date End Date Taking? Authorizing Provider  acetaminophen (TYLENOL) 500 MG tablet Take 1,000 mg by mouth 2 (two) times daily as needed (pain).    [provider]  CALCIUM PO Take 500 mg by mouth at bedtime.    [provider]  cyanocobalamin (VITAMIN B12) 1000 MCG tablet Take 1,000 mcg by mouth daily.    [provider]  donepezil (ARICEPT) 5 MG tablet TAKE 1 TABLET BY MOUTH EVERYDAY AT BEDTIME 01/14/23   Windell Norfolk, MD  furosemide (LASIX) 20 MG tablet TAKE 1 TABLET BY MOUTH EVERY DAY 03/04/23   [name], MD  hyoscyamine (LEVSIN) 0.125 MG tablet Take 0.125 mg by mouth 2 (two) times daily as needed for cramping. 01/04/20   [provider]  levothyroxine (SYNTHROID) 50 MCG tablet Take 50 mcg by mouth every morning. 01/20/22   [provider]  mesalamine (APRISO) 0.375 g 24 hr capsule Take 4 capsules by mouth daily. 08/28/18   [provider]  metFORMIN (GLUCOPHAGE-XR) 500 MG 24 hr tablet Take 3 tablets (1,500 mg total) by mouth daily. 04/05/18   Lenox Ponds, MD  nebivolol  (BYSTOLIC) 10 MG tablet Take 1 tablet (10 mg total) by mouth daily. 02/28/22   Meredeth Ide, MD  Probiotic Product (ALIGN PO) Take 1 tablet by mouth daily.    [provider]  simethicone (MYLICON) 125 MG chewable tablet Chew 125 mg by mouth every 6 (six) hours as needed for flatulence.    [provider]  simvastatin (ZOCOR) 20 MG tablet Take 20 mg by mouth every evening.  08/05/13   [provider]  valsartan (DIOVAN) 160 MG tablet Take 1 tablet (160 mg total) by mouth daily. 02/28/22 02/28/23  Meredeth Ide, MD      Allergies    Ace inhibitors, Ciprofloxacin, Doxycycline, Doxycycline hyclate, Shellfish-derived products, and Shrimp [shellfish allergy]    Review of Systems   Review of Systems  Neurological:  Positive for weakness.    Physical Exam Updated Vital Signs BP (!) 158/121   Pulse 94   Temp 98 F (36.7 C)   Resp 20   Ht 5\' 4"  (1.626 m)   Wt 67.1 kg   SpO2 99%   BMI 25.40 kg/m  Physical Exam Vitals and nursing note reviewed.  Constitutional:      General: She is not in acute distress.    Appearance: She is well-developed. She is not diaphoretic.  HENT:     Head:  Normocephalic and atraumatic.  Eyes:     Pupils: Pupils are equal, round, and reactive to light.  Cardiovascular:     Rate and Rhythm: Normal rate and regular rhythm.     Heart sounds: No murmur heard.    No friction rub. No gallop.  Pulmonary:     Effort: Pulmonary effort is normal.     Breath sounds: No wheezing or rales.  Abdominal:     General: There is no distension.     Palpations: Abdomen is soft.     Tenderness: There is no abdominal tenderness.  Musculoskeletal:        General: No tenderness.     Cervical back: Normal range of motion and neck supple.  Skin:    General: Skin is warm and dry.  Neurological:     Mental Status: She is alert and oriented to person, place, and time.     Comments: Patient has trouble with word finding.  No obvious cranial nerve deficit for  me.  Left upper extremity weakness compared to right though recent carpal tunnel procedure.  Psychiatric:        Behavior: Behavior normal.     ED Results / Procedures / Treatments   Labs (all labs ordered are listed, but only abnormal results are displayed) Labs Reviewed  BASIC METABOLIC PANEL - Abnormal; Notable for the following components:      Result Value   Sodium 120 (*)    Chloride 82 (*)    CO2 20 (*)    Glucose, Bld 194 (*)    Anion gap 18 (*)    All other components within normal limits  CBC - Abnormal; Notable for the following components:   MCHC 36.6 (*)    All other components within normal limits  DIFFERENTIAL - Abnormal; Notable for the following components:   Lymphs Abs 0.5 (*)    All other components within normal limits  I-STAT CHEM 8, ED - Abnormal; Notable for the following components:   Sodium 119 (*)    Chloride 84 (*)    Glucose, Bld 201 (*)    Calcium, Ion 1.03 (*)    All other components within normal limits  TROPONIN I (HIGH SENSITIVITY) - Abnormal; Notable for the following components:   Troponin I (High Sensitivity) 21 (*)    All other components within normal limits  ETHANOL  PROTIME-INR  APTT  RAPID URINE DRUG SCREEN, HOSP PERFORMED  URINALYSIS, ROUTINE W REFLEX MICROSCOPIC  TROPONIN I (HIGH SENSITIVITY)    EKG None  Radiology CT ANGIO HEAD NECK W WO CM (CODE STROKE)  Result Date: 03/07/2023 CLINICAL DATA:  77 year old female code stroke presentation. Wernicke aphasia. EXAM: CT ANGIOGRAPHY HEAD AND NECK TECHNIQUE: Multidetector CT imaging of the head and neck was performed using the standard protocol during bolus administration of intravenous contrast. Multiplanar CT image reconstructions and MIPs were obtained to evaluate the vascular anatomy. Carotid stenosis measurements (when applicable) are obtained utilizing NASCET criteria, using the distal internal carotid diameter as the denominator. RADIATION DOSE REDUCTION: This exam was  performed according to the departmental dose-optimization program which includes automated exposure control, adjustment of the mA and/or kV according to patient size and/or use of iterative reconstruction technique. CONTRAST:  75mL OMNIPAQUE IOHEXOL 350 MG/ML SOLN COMPARISON:  Plain head CT 0443 hours today. FINDINGS: CTA NECK Skeleton: No acute osseous abnormality identified. Osteopenia. Mild for age cervical spine degeneration. Upper chest: Negative. Other neck: Negative. Aortic arch: 3 vessel arch.  Mild for age arch  atherosclerosis. Right carotid system: Negative brachiocephalic artery and right CCA origin. Mildly tortuous right CCA. Mild soft and calcified plaque combined at the right ICA origin and bulb but no stenosis. Left carotid system: Mild tortuosity with no plaque or stenosis to the skull base. Vertebral arteries: Mild calcified right subclavian artery origin. Normal right vertebral artery origin. Right vertebral appears mildly dominant, mildly ectatic, and is widely patent to the skull base without stenosis. Minimal proximal left subclavian artery plaque. Normal left vertebral artery origin. Fairly codominant left vertebral artery is also mildly tortuous and widely patent to the skull base with no stenosis. CTA HEAD Posterior circulation: Bilateral vertebral artery V4 segment calcified plaque. Mild left and mild-to-moderate right V4 segment stenosis (series 8, image 141). Patent vertebrobasilar junction. Patent PICA origins. Patent basilar artery with mild ectasia and no stenosis. Patent SCA and PCA origins. Posterior communicating arteries are diminutive or absent. Bilateral PCA branches are within normal limits. Anterior circulation: Both ICA siphons are patent. Both siphons are mildly ectatic. There is mild to moderate calcified plaque bilaterally, but no siphon stenosis. Patent carotid termini, MCA and ACA origins. Diminutive or absent anterior communicating artery. Bilateral ACA branches are  normal aside from mild tortuosity. Right MCA M1 segment and bifurcation are patent without stenosis. Right MCA branches are within normal limits. Left MCA M1 segment and trifurcation are patent without stenosis. No left MCA branch occlusion is identified, left MCA branches appear within normal limits. Venous sinuses: Early contrast timing, not evaluated. Anatomic variants: None. Review of the MIP images confirms the above findings IMPRESSION: 1. Negative for large vessel occlusion. 2. Mild for age atherosclerosis, especially in the neck. Calcified ICA siphon plaque with no significant stenosis. Calcified plaque of both distal vertebral arteries with up to Moderate Right and mild left V4 vertebral stenosis. Otherwise negative intracranial circulation. Preliminary report of no LVO discussed by telephone with Dr. Caryl Pina on 03/07/2023 at 0508 hours. Electronically Signed   By: Odessa Fleming M.D.   On: 03/07/2023 05:18   CT HEAD CODE STROKE WO CONTRAST  Result Date: 03/07/2023 CLINICAL DATA:  Code stroke. 77 year old female. Vomiting and abnormal speech. EXAM: CT HEAD WITHOUT CONTRAST TECHNIQUE: Contiguous axial images were obtained from the base of the skull through the vertex without intravenous contrast. RADIATION DOSE REDUCTION: This exam was performed according to the departmental dose-optimization program which includes automated exposure control, adjustment of the mA and/or kV according to patient size and/or use of iterative reconstruction technique. COMPARISON:  Head CT 02/22/2022. FINDINGS: Brain: Intermittent motion artifact. Cerebral volume appears stable from last year. No midline shift, mass effect, or evidence of intracranial mass lesion. No ventriculomegaly. No acute intracranial hemorrhage identified. Patchy bilateral cerebral white matter hypodensity appears grossly stable. No acute cortically based infarct is evident. Basilar cisterns appear normal. Vascular: No suspicious intracranial vascular  hyperdensity. Calcified atherosclerosis at the skull base. Skull: Intermittent motion artifact. No acute osseous abnormality identified. Sinuses/Orbits: Visualized paranasal sinuses and mastoids are stable and well aerated. Other: Rightward gaze similar to the prior exam. No other acute orbit or scalp soft tissue finding. ASPECTS [facility] Stroke Program Early CT Score) Total score (0-10 with 10 being normal): 10 IMPRESSION: 1. Mildly motion degraded exam. No acute cortically based infarct or acute intracranial hemorrhage identified. ASPECTS 10. 2. These results were communicated to Dr. Otelia Limes at 4:47 am on 03/07/2023 by text page via the [facility] messaging system. Electronically Signed   By: Odessa Fleming M.D.   On: 03/07/2023 04:48  Procedures .Critical Care  Performed by: Melene Plan, DO Authorized by: Melene Plan, DO   Critical care provider statement:    Critical care time (minutes):  35   Critical care time was exclusive of:  Separately billable procedures and treating other patients   Critical care was time spent personally by me on the following activities:  Development of treatment plan with patient or surrogate, discussions with consultants, evaluation of patient's response to treatment, examination of patient, ordering and review of laboratory studies, ordering and review of radiographic studies, ordering and performing treatments and interventions, pulse oximetry, re-evaluation of patient's condition and review of old charts   Care discussed with: admitting provider       Medications Ordered in ED Medications  sodium chloride 0.9 % bolus 1,000 mL (has no administration in time range)  aspirin EC tablet 650 mg (has no administration in time range)  aspirin EC tablet 81 mg (has no administration in time range)  ondansetron (ZOFRAN) injection 4 mg (has no administration in time range)  prochlorperazine (COMPAZINE) injection 5 mg (has no administration in time range)  diphenhydrAMINE (BENADRYL)  injection 12.5 mg (has no administration in time range)  iohexol (OMNIPAQUE) 350 MG/ML injection 75 mL (75 mLs Intravenous Contrast Given 03/07/23 0501)    ED Course/ Medical Decision Making/ A&P                                 Medical Decision Making Amount and/or Complexity of Data Reviewed Labs: ordered. Radiology: ordered.  Risk Prescription drug management. Decision regarding hospitalization.   77 yo F with a chief complaints of difficulty with speech.  Patient has an aphasia.  It is difficult to have her tell me when she noticed this, I am not sure that she actually did notice it until EMS arrived the second time.  Per report EMS came out to her house about 1145 and at that time she was normal.  The next time they came out to see her she was reportedly complaining of weakness though she tells me she felt she was having maybe some trouble breathing.  On my record review the patient had an episode last time she was admitted to the hospital where she had some confusion it was thought to be a hypertensive encephalopathy.  She is a bit hypertensive here.  I will activate her as a code stroke though it is difficult to know a definitive last known normal with her aphasia.  EMS was at her house about midnight and she was reportedly normal at that time.  Patient was seen by neurology, did not feel they were a candidate for thrombolytics.  Recommended medical admission.  Plan to start her on dual antiplatelet therapy.  Patient sodium is low at 120.  She appears a bit dehydrated and so a bolus of IV fluids was ordered.  She has an elevated anion gap of unknown etiology, bicarb is relatively normal.  Will discuss with medicine.  The patients results and plan were reviewed and discussed.   Any x-rays performed were independently reviewed by myself.   Differential diagnosis were considered with the presenting HPI.  Medications  sodium chloride 0.9 % bolus 1,000 mL (has no administration in  time range)  aspirin EC tablet 650 mg (has no administration in time range)  aspirin EC tablet 81 mg (has no administration in time range)  ondansetron (ZOFRAN) injection 4 mg (has no administration in  time range)  prochlorperazine (COMPAZINE) injection 5 mg (has no administration in time range)  diphenhydrAMINE (BENADRYL) injection 12.5 mg (has no administration in time range)  iohexol (OMNIPAQUE) 350 MG/ML injection 75 mL (75 mLs Intravenous Contrast Given 03/07/23 0501)    Vitals:   03/07/23 0421 03/07/23 0426  BP: (!) 158/121   Pulse: 94   Resp: 20   Temp:  98 F (36.7 C)  SpO2: 99%   Weight:  67.1 kg  Height:  5\' 4"  (1.626 m)    Final diagnoses:  Aphasia    Admission/ observation were discussed with the admitting physician, patient and/or family and they are comfortable with the plan.         Final Clinical Impression(s) / ED Diagnoses Final diagnoses:  Aphasia    Rx / DC Orders ED Discharge Orders     None         Melene Plan, DO 03/07/23 [phone]

## 2023-03-07 NOTE — ED Notes (Addendum)
RN wheelchaired pt to bathroom and noticed weakness in left leg. MD notified.

## 2023-03-07 NOTE — ED Notes (Signed)
Pt currently in MRI

## 2023-03-07 NOTE — ED Triage Notes (Signed)
Pt coming in from home. Pt complaining of weakness increasing since 2345. Ems responded to call [facility]. Pt felt weak , complaining of nausea and vomited. Pt then called again this evening due to increasing weakness chest tightness, and speech issues.  Ems  got an 18 in her left forearm Bp 162/106 Hr 95 98% on rA pt complaining of shob 237 cbg

## 2023-03-07 NOTE — Code Documentation (Signed)
Stroke Response Nurse Documentation Code Documentation  TEQUISHA MCMONAGLE is a 77 y.o. female arriving to [facility]  via [facility] on 8/8 with past medical hx of asthma, DM, HTN, HLD. On No antithrombotic. Code stroke was activated by ED.   Patient from home where she was LKW at 2345 and now complaining of Aphasia.  Stroke team at the bedside on patient arrival. Labs drawn and patient cleared for CT by Dr. Adela Lank. Patient to CT with team. NIHSS 1, see documentation for details and code stroke times. Patient with Expressive aphasia  on exam. The following imaging was completed:  CT Head and CTA. Patient is not a candidate for IV Thrombolytic due to outside of window. Patient is not a candidate for IR due to No LVO.   Care Plan: Neuro checks q2.   Bedside handoff with ED RN Marchelle Folks.    Rose Fillers  Rapid Response RN

## 2023-03-07 NOTE — Plan of Care (Signed)

## 2023-03-07 NOTE — Progress Notes (Addendum)
STROKE TEAM PROGRESS NOTE   BRIEF HPI Kristina [name] is a 77 y.o. female with a PMHx of asthma, DM, HTN, hypercholesterolemia, rosacea, thyroid disease and ulcerative colitis who presents to the ED from home with a c/c of diffuse weakness, chest tightness and impaired speech.    SIGNIFICANT HOSPITAL EVENTS 8/8 early am: EMS to ED  INTERIM HISTORY/SUBJECTIVE Pt reports that yesterday she was experiencing hiccups. She reports that the hiccups gradually became worse until they were cause "intense" nausea and vomiting. Shortly after this she began experiencing "labored speech and breathing." Due to this, she called EMS. The EMS responded and found no emergent concerns. She then started having chest tightness, diffuse weakness, and unspecified worsening of her speech. EMS was called again, and they brought her to [facility] ED in the early morning of today. Currently, she reports resolutions of all symptoms (including speech difficulties) except for continued hiccupping. She endorse having weakness that is generalized but not focal.   Per chart review, one year ago pt had an epiosde involving n/v, foggy thought, headache, and speech issues. Physical exam at the time was negative for speech abnormalities but suggested impaired attention. MRI was negative for acute intracranial abnormalities.   Also per chart review, patient is followed by Dr. Teresa [name] for mild cognitive impairment. Per last visit, patient was having word finding difficulties.    OBJECTIVE  CBC    Component Value Date/Time   WBC 7.8 03/07/2023 0428   RBC 4.31 03/07/2023 0428   HGB 14.6 03/07/2023 0435   HCT 43.0 03/07/2023 0435   PLT 261 03/07/2023 0428   MCV 87.5 03/07/2023 0428   MCH 32.0 03/07/2023 0428   MCHC 36.6 (H) 03/07/2023 0428   RDW 12.0 03/07/2023 0428   LYMPHSABS 0.5 (L) 03/07/2023 0428   MONOABS 0.3 03/07/2023 0428   EOSABS 0.0 03/07/2023 0428   BASOSABS 0.0 03/07/2023 0428    BMET    Component Value  Date/Time   NA 123 (L) 03/07/2023 1146   NA 141 03/22/2020 0929   K 3.5 03/07/2023 1146   CL 89 (L) 03/07/2023 1146   CO2 20 (L) 03/07/2023 1146   GLUCOSE 179 (H) 03/07/2023 1146   BUN 8 03/07/2023 1146   BUN 13 03/22/2020 0929   CREATININE 0.66 03/07/2023 1146   CALCIUM 9.1 03/07/2023 1146   GFRNONAA >60 03/07/2023 1146    IMAGING past 24 hours ECHOCARDIOGRAM COMPLETE  Result Date: 03/07/2023    ECHOCARDIOGRAM REPORT   Patient Name:   Kristina [name] Date of Exam: 03/07/2023 Medical Rec #:  [id number]        Height:       64.0 in Accession #:    [id number]       Weight:       148.0 lb Date of Birth:  September 30, 1945         BSA:          1.721 m Patient Age:    77 years         BP:           122/82 mmHg Patient Gender: F                HR:           87 bpm. Exam Location:  Inpatient Procedure: 2D Echo, Color Doppler and Cardiac Doppler Indications:    Stroke i63.9  History:        Patient has prior history of Echocardiogram examinations, most  recent 02/23/2022. CHF; Risk Factors:Hypertension, Diabetes and                 Dyslipidemia.  Sonographer:    Irving Burton Senior RDCS Referring Phys: Clydie Braun IMPRESSIONS  1. Left ventricular ejection fraction, by estimation, is 55 to 60%. The left ventricle has normal function. The left ventricle has no regional wall motion abnormalities. Indeterminate diastolic filling due to E-A fusion.  2. Right ventricular systolic function is normal. The right ventricular size is normal. Tricuspid regurgitation signal is inadequate for assessing PA pressure.  3. A small pericardial effusion is present. The pericardial effusion is anterior to the right ventricle and surrounding the apex.  4. The mitral valve is normal in structure. No evidence of mitral valve regurgitation.  5. The aortic valve was not well visualized. There is mild calcification of the aortic valve. Aortic valve regurgitation is not visualized. Aortic valve sclerosis is present, with no evidence  of aortic valve stenosis.  6. The inferior vena cava is normal in size with greater than 50% respiratory variability, suggesting right atrial pressure of 3 mmHg. Conclusion(s)/Recommendation(s): No intracardiac source of embolism detected on this transthoracic study. Consider a transesophageal echocardiogram to exclude cardiac source of embolism if clinically indicated. FINDINGS  Left Ventricle: Left ventricular ejection fraction, by estimation, is 55 to 60%. The left ventricle has normal function. The left ventricle has no regional wall motion abnormalities. The left ventricular internal cavity size was normal in size. There is  borderline asymmetric left ventricular hypertrophy of the basal-septal segment. Indeterminate diastolic filling due to E-A fusion. Right Ventricle: The right ventricular size is normal. No increase in right ventricular wall thickness. Right ventricular systolic function is normal. Tricuspid regurgitation signal is inadequate for assessing PA pressure. Left Atrium: Left atrial size was normal in size. Right Atrium: Right atrial size was normal in size. Pericardium: A small pericardial effusion is present. The pericardial effusion is anterior to the right ventricle and surrounding the apex. Presence of epicardial fat layer. Mitral Valve: The mitral valve is normal in structure. No evidence of mitral valve regurgitation. Tricuspid Valve: The tricuspid valve is normal in structure. Tricuspid valve regurgitation is not demonstrated. Aortic Valve: The aortic valve was not well visualized. There is mild calcification of the aortic valve. Aortic valve regurgitation is not visualized. Aortic valve sclerosis is present, with no evidence of aortic valve stenosis. Pulmonic Valve: The pulmonic valve was not well visualized. Pulmonic valve regurgitation is not visualized. Aorta: The aortic root is normal in size and structure and the ascending aorta was not well visualized. Venous: The inferior vena cava  is normal in size with greater than 50% respiratory variability, suggesting right atrial pressure of 3 mmHg. IAS/Shunts: The atrial septum is grossly normal.  LEFT VENTRICLE PLAX 2D LVIDd:         3.70 cm   Diastology LVIDs:         2.60 cm   LV e' medial:    3.15 cm/s LV PW:         0.90 cm   LV E/e' medial:  16.3 LV IVS:        1.00 cm   LV e' lateral:   3.37 cm/s LVOT diam:     1.90 cm   LV E/e' lateral: 15.3 LV SV:         47 LV SV Index:   27 LVOT Area:     2.84 cm  RIGHT VENTRICLE RV S prime:  14.80 cm/s TAPSE (M-mode): 1.8 cm LEFT ATRIUM             Index        RIGHT ATRIUM           Index LA diam:        2.80 cm 1.63 cm/m   RA Area:     11.00 cm LA Vol (A2C):   32.9 ml 19.11 ml/m  RA Volume:   22.90 ml  13.30 ml/m LA Vol (A4C):   38.8 ml 22.54 ml/m LA Biplane Vol: 38.2 ml 22.19 ml/m  AORTIC VALVE LVOT Vmax:   87.90 cm/s LVOT Vmean:  66.700 cm/s LVOT VTI:    0.166 m  AORTA Ao Root diam: 2.80 cm MITRAL VALVE MV Area (PHT): 3.01 cm     SHUNTS MV Decel Time: 252 msec     Systemic VTI:  0.17 m MV E velocity: 51.40 cm/s   Systemic Diam: 1.90 cm MV A velocity: 105.00 cm/s MV E/A ratio:  0.49 Weston Brass MD Electronically signed by Weston Brass MD Signature Date/Time: 03/07/2023/12:25:24 PM    Final    MR BRAIN WO CONTRAST  Result Date: 03/07/2023 CLINICAL DATA:  Word-finding difficulty. EXAM: MRI HEAD WITHOUT CONTRAST TECHNIQUE: Multiplanar, multiecho pulse sequences of the brain and surrounding structures were obtained without intravenous contrast. COMPARISON:  Same-day CT/CTA head and neck FINDINGS: Brain: There is no acute intracranial hemorrhage, extra-axial fluid collection, or acute infarct Background parenchymal volume is within expected limits for age. The ventricles are stable in size. 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 The pituitary and suprasellar region are normal.  There is no mass lesion. There is no mass effect or midline shift. Vascular: Normal flow voids. Skull and upper cervical spine: Normal marrow signal. Sinuses/Orbits: The paranasal sinuses are clear. Bilateral lens implants are in place. The globes and orbits are otherwise unremarkable. Other: The mastoid air cells and middle ear cavities are clear. IMPRESSION: No acute intracranial pathology. Electronically Signed   By: Lesia Hausen M.D.   On: 03/07/2023 10:02   DG Chest Port 1 View  Result Date: 03/07/2023 CLINICAL DATA:  77 year old female with history of shortness of breath. EXAM: PORTABLE CHEST 1 VIEW COMPARISON:  Chest x-ray 02/27/2022. FINDINGS: Lung volumes are normal. No consolidative airspace disease. No pleural effusions. No pneumothorax. No evidence of pulmonary edema. Heart size is normal. Upper mediastinal contours are distorted by patient's rotation to the left. IMPRESSION: 1. No radiographic evidence of acute cardiopulmonary disease. Electronically Signed   By: Trudie Reed M.D.   On: 03/07/2023 06:02   CT ANGIO HEAD NECK W WO CM (CODE STROKE)  Result Date: 03/07/2023 CLINICAL DATA:  77 year old female code stroke presentation. Wernicke aphasia. EXAM: CT ANGIOGRAPHY HEAD AND NECK TECHNIQUE: Multidetector CT imaging of the head and neck was performed using the standard protocol during bolus administration of intravenous contrast. Multiplanar CT image reconstructions and MIPs were obtained to evaluate the vascular anatomy. Carotid stenosis measurements (when applicable) are obtained utilizing NASCET criteria, using the distal internal carotid diameter as the denominator. RADIATION DOSE REDUCTION: This exam was performed according to the departmental dose-optimization program which includes automated exposure control, adjustment of the mA and/or kV according to patient size and/or use of iterative reconstruction technique. CONTRAST:  75mL OMNIPAQUE IOHEXOL 350 MG/ML SOLN COMPARISON:  Plain head  CT 0443 hours today. FINDINGS: CTA NECK Skeleton: No acute osseous abnormality identified.  Osteopenia. Mild for age cervical spine degeneration. Upper chest: Negative. Other neck: Negative. Aortic arch: 3 vessel arch.  Mild for age arch atherosclerosis. Right carotid system: Negative brachiocephalic artery and right CCA origin. Mildly tortuous right CCA. Mild soft and calcified plaque combined at the right ICA origin and bulb but no stenosis. Left carotid system: Mild tortuosity with no plaque or stenosis to the skull base. Vertebral arteries: Mild calcified right subclavian artery origin. Normal right vertebral artery origin. Right vertebral appears mildly dominant, mildly ectatic, and is widely patent to the skull base without stenosis. Minimal proximal left subclavian artery plaque. Normal left vertebral artery origin. Fairly codominant left vertebral artery is also mildly tortuous and widely patent to the skull base with no stenosis. CTA HEAD Posterior circulation: Bilateral vertebral artery V4 segment calcified plaque. Mild left and mild-to-moderate right V4 segment stenosis (series 8, image 141). Patent vertebrobasilar junction. Patent PICA origins. Patent basilar artery with mild ectasia and no stenosis. Patent SCA and PCA origins. Posterior communicating arteries are diminutive or absent. Bilateral PCA branches are within normal limits. Anterior circulation: Both ICA siphons are patent. Both siphons are mildly ectatic. There is mild to moderate calcified plaque bilaterally, but no siphon stenosis. Patent carotid termini, MCA and ACA origins. Diminutive or absent anterior communicating artery. Bilateral ACA branches are normal aside from mild tortuosity. Right MCA M1 segment and bifurcation are patent without stenosis. Right MCA branches are within normal limits. Left MCA M1 segment and trifurcation are patent without stenosis. No left MCA branch occlusion is identified, left MCA branches appear within normal  limits. Venous sinuses: Early contrast timing, not evaluated. Anatomic variants: None. Review of the MIP images confirms the above findings IMPRESSION: 1. Negative for large vessel occlusion. 2. Mild for age atherosclerosis, especially in the neck. Calcified ICA siphon plaque with no significant stenosis. Calcified plaque of both distal vertebral arteries with up to Moderate Right and mild left V4 vertebral stenosis. Otherwise negative intracranial circulation. Preliminary report of no LVO discussed by telephone with Dr. Caryl Pina on 03/07/2023 at 0508 hours. Electronically Signed   By: Odessa Fleming M.D.   On: 03/07/2023 05:18   CT HEAD CODE STROKE WO CONTRAST  Result Date: 03/07/2023 CLINICAL DATA:  Code stroke. 77 year old female. Vomiting and abnormal speech. EXAM: CT HEAD WITHOUT CONTRAST TECHNIQUE: Contiguous axial images were obtained from the base of the skull through the vertex without intravenous contrast. RADIATION DOSE REDUCTION: This exam was performed according to the departmental dose-optimization program which includes automated exposure control, adjustment of the mA and/or kV according to patient size and/or use of iterative reconstruction technique. COMPARISON:  Head CT 02/22/2022. FINDINGS: Brain: Intermittent motion artifact. Cerebral volume appears stable from last year. No midline shift, mass effect, or evidence of intracranial mass lesion. No ventriculomegaly. No acute intracranial hemorrhage identified. Patchy bilateral cerebral white matter hypodensity appears grossly stable. No acute cortically based infarct is evident. Basilar cisterns appear normal. Vascular: No suspicious intracranial vascular hyperdensity. Calcified atherosclerosis at the skull base. Skull: Intermittent motion artifact. No acute osseous abnormality identified. Sinuses/Orbits: Visualized paranasal sinuses and mastoids are stable and well aerated. Other: Rightward gaze similar to the prior exam. No other acute orbit or  scalp soft tissue finding. ASPECTS [facility] Stroke Program Early CT Score) Total score (0-10 with 10 being normal): 10 IMPRESSION: 1. Mildly motion degraded exam. No acute cortically based infarct or acute intracranial hemorrhage identified. ASPECTS 10. 2. These results were communicated to Dr. Otelia Limes at 4:47 am on 03/07/2023  by text page via the [facility] messaging system. Electronically Signed   By: Odessa Fleming M.D.   On: 03/07/2023 04:48    Vitals:   03/07/23 0823 03/07/23 1102 03/07/23 1238 03/07/23 1342  BP:  (!) 147/89  113/68  Pulse:  87  82  Resp:  18    Temp: 98 F (36.7 C)  98.6 F (37 C) 98.1 F (36.7 C)  TempSrc: Oral  Oral Oral  SpO2:  96%  100%  Weight:      Height:         PHYSICAL EXAM General:  Alert, well-nourished, well-developed patient in no acute distress Psych:  Mood and affect appropriate for situation CV: Regular rate and rhythm on monitor Respiratory:  Regular, unlabored respirations on room air GI: Abdomen soft and nontender   NEURO:  Mental Status: AA&Ox3, patient is able to give clear and coherent history. Some memory issues when providing HPI proportionate to someone with MCI at baseline.  Speech/Language: speech is without dysarthria or aphasia, except for some word finding difficulties.  Naming, repetition, fluency, and comprehension intact.  Cranial Nerves:  II: PERRL. Visual fields full.  III, IV, VI: EOMI. Eyelids elevate symmetrically.  V: Sensation is intact to light touch and symmetrical to face.  VII: Face is symmetrical resting and smiling VIII: hearing intact to voice. IX, X: Palate elevates symmetrically. Phonation is normal.  WU:[id number] shrug 5/5. XII: tongue is midline without fasciculations. Motor: 5/5 strength to all muscle groups tested.  Tone: is normal and bulk is normal Sensation- Intact to light touch bilaterally. Extinction absent to light touch to DSS.   Coordination: FTN intact bilaterally, HKS: no ataxia in BLE.No drift.  Gait-  deferred   ASSESSMENT/PLAN  No longer experience symptoms from presentation except for mild generalized weakness and continued hiccups and word finding difficulties (baseline per o/p neurology). More consistent with encephalopathy.    Likely metabolic encephalopathy (hyponatremia or other medical cause) Code Stroke CT head No acute abnormality. ASPECTS 10.    CTA head & neck no LVO MRI  no acute intracranial abnormality 2D Echo 55-60%. No intracardiac source of embolism detected.  LDL 62 HgbA1c 6.7 VTE prophylaxis - lovenox 40 No antithrombotic prior to admission, now on aspirin 81 mg daily. Therapy recommendations:  pending Disposition:  pending  Hyponatremia (severe --> moderate) Na: 120 --> 119 --> 123 Etiology: vomiting (Cl 82-84 at presentation), ulcerative colitis, malnutrition, hypothyroidism, medication (e.g. furosemide), SIADH Consistent with generalized weakness Treatment per primary team EEG pending  Hypertension Home meds:  valsartan 160 once daily, furosemide 20 once daily Unstable, SBP 115-160, DBP 70-120 BP goal normotensive   Hyperlipidemia Home meds:  zocor 20 LDL 62, goal < 70 High intensity statin not indicated b/c LDL at goal Continue statin at discharge  Diabetes type II Controlled Home meds:  metformin 500 three times daily HgbA1c 6.7, goal < 7.0 CBG  SSI Close PCP follow up  Other Stroke Risk Factors Advanced age Congestive heart failure  Other Active Problems MCI, on donepezil 5 and follow o/p neurology Dr. Teresa [name] Ulcerative colitis Hiccup - triggered N/V  Hospital day # 0   Meryl Dare, MD PGY-1 Psychiatry Resident 03/07/2023, 2:00 PM  ATTENDING NOTE: I reviewed above note and agree with the assessment and plan. Pt was seen and examined.   No family at the bedside. Pt lying in bed undergoing EEG set up. Pt AAO x 3. No aphasia, but pt does have some memory issue so that she may forget  the topic that she is talking about, with  reminder, she can continue with the topic, does not consistent with word finding difficulty though. Follows all simple commands, moving all extremities. Pt does have Na 119, on IVF. She stated that she has DM and UC, therefore her diet and nutrition are hard to balance.   MRI no acute infarct. EEG pending, likely low yield for seizure. Pt symptoms most likely related to encephalopathy from electrolyte imbalance, MCI in the setting of medical issues. OK to continue ASA, continue statin. PT and OT pending. Encephalopathy management per primary team.  For detailed assessment and plan, please refer to above/below as I have made changes wherever appropriate.   Neurology will sign off. Please call with questions. Pt will follow up with Dr. Teresa [name] at [facility] on 06/10/23. Thanks for the consult.   Marvel Plan, MD PhD Stroke Neurology 03/07/2023 3:03 PM    To contact Stroke Continuity provider, please refer to [email or web address]. After hours, contact General Neurology

## 2023-03-07 NOTE — Progress Notes (Signed)
EEG complete - results pending 

## 2023-03-07 NOTE — H&P (Addendum)
History and Physical    Patient: Kristina [name]:[id number] DOB: 1945-08-28 DOA: 03/07/2023 DOS: the patient was seen and examined on 03/07/2023 PCP: Shon Hale, MD  Patient coming from: Home via EMS  Chief Complaint:  Chief Complaint  Patient presents with   Weakness   HPI: Kristina [name] is a 77 y.o. female with medical history significant of hypertension, hyperlipidemia, diastolic congestive heart failure, diabetes mellitus type 2, hypothyroidism, ulcerative colitis, and GERD presents with complaints of weakness.  Earlier yesterday she had eaten some frozen spaghetti and reported having hiccups that lasted last approximately 2 hours.  She drank water and thereafter reported having violent vomiting.  Emesis was reported to be red in color secondary to eaten spaghetti and denies seeing any blood present.  Her friend came over to check on her and EMS was initially called.  However, after evaluation it was decided that she did not need to come to the hospital.  However later on that evening she reported having some left-sided chest discomfort, difficulty breathing, generalized weakness, and reported having trouble getting out what she wanted to say.   In the emergency department patient was seen as a code stroke.  CT scan did not note any acute infarct or intracranial bleed but was motion degraded.  CTA of the head and neck did not reveal any large vessel occlusion.  Patient was not thought to be a tPA candidate.  Noted to be afebrile with blood pressures elevated up to 158/121, and all other vital signs maintained.  Labs significant for sodium 120, chloride 82, CO2 20, glucose 194, anion gap 18, alcohol level undetectable, INR 1.1, and high-sensitivity troponin 21->27.  Chest x-ray did not note any acute abnormality.  Urinalysis positive for glucose and ketones.  UDS negative.  Patient was given 1 L of normal saline IV fluids, Zofran, Compazine, and Benadryl. Neurology recommended  loading patient with aspirin 650 mg which she received.     Review of Systems: As mentioned in the history of present illness. All other systems reviewed and are negative. Past Medical History:  Diagnosis Date   Anxiety    Asthma    Diabetes (HCC)    Diabetes mellitus without complication (HCC)    Diverticulitis    GERD (gastroesophageal reflux disease)    HTN (hypertension)    Hypercholesteremia    Hyperlipidemia    Hypertension    Obesity    Rosacea    Thyroid disease    Ulcerative colitis (HCC)    Past Surgical History:  Procedure Laterality Date   ACHILLES TENDON REPAIR     APPENDECTOMY     BIOPSY  10/16/2018   Procedure: BIOPSY;  Surgeon: Kathi Der, MD;  Location: [facility];  Service: Gastroenterology;;   CESAREAN SECTION     CHOLECYSTECTOMY     FLEXIBLE SIGMOIDOSCOPY N/A 10/16/2018   Procedure: FLEXIBLE SIGMOIDOSCOPY;  Surgeon: Kathi Der, MD;  Location: [facility];  Service: Gastroenterology;  Laterality: N/A;   hammertoe     Social History:  reports that she has never smoked. She has never used smokeless tobacco. She reports that she does not drink alcohol and does not use drugs.  Allergies  Allergen Reactions   Ace Inhibitors Cough   Ciprofloxacin Other (See Comments)    Tendinitis in heel and posterior right LE  Tendon rupture  Other Reaction(s): achiles tendon issue   Doxycycline Other (See Comments)    Pt does not remember what the reaction was - many years ago  Doxycycline Hyclate     Other Reaction(s): unknown   Shellfish-Derived Products     Other Reaction(s): vomiting, diarrhea, purple spots   Shrimp [Shellfish Allergy] Diarrhea, Nausea And Vomiting and Rash    Family History  Problem Relation Age of Onset   Hypertension Mother    Heart failure Mother    Heart attack Mother    Hypotension Father    Dementia Father     Prior to Admission medications   Medication Sig Start Date End Date Taking? Authorizing Provider   acetaminophen (TYLENOL) 500 MG tablet Take 1,000 mg by mouth 2 (two) times daily as needed (pain).    [provider]  CALCIUM PO Take 500 mg by mouth at bedtime.    [provider]  cyanocobalamin (VITAMIN B12) 1000 MCG tablet Take 1,000 mcg by mouth daily.    [provider]  donepezil (ARICEPT) 5 MG tablet TAKE 1 TABLET BY MOUTH EVERYDAY AT BEDTIME 01/14/23   Windell Norfolk, MD  furosemide (LASIX) 20 MG tablet TAKE 1 TABLET BY MOUTH EVERY DAY 03/04/23   [name], MD  hyoscyamine (LEVSIN) 0.125 MG tablet Take 0.125 mg by mouth 2 (two) times daily as needed for cramping. 01/04/20   [provider]  levothyroxine (SYNTHROID) 50 MCG tablet Take 50 mcg by mouth every morning. 01/20/22   [provider]  mesalamine (APRISO) 0.375 g 24 hr capsule Take 4 capsules by mouth daily. 08/28/18   [provider]  metFORMIN (GLUCOPHAGE-XR) 500 MG 24 hr tablet Take 3 tablets (1,500 mg total) by mouth daily. 04/05/18   Lenox Ponds, MD  nebivolol (BYSTOLIC) 10 MG tablet Take 1 tablet (10 mg total) by mouth daily. 02/28/22   Meredeth Ide, MD  Probiotic Product (ALIGN PO) Take 1 tablet by mouth daily.    [provider]  simethicone (MYLICON) 125 MG chewable tablet Chew 125 mg by mouth every 6 (six) hours as needed for flatulence.    [provider]  simvastatin (ZOCOR) 20 MG tablet Take 20 mg by mouth every evening.  08/05/13   [provider]  valsartan (DIOVAN) 160 MG tablet Take 1 tablet (160 mg total) by mouth daily. 02/28/22 02/28/23  Meredeth Ide, MD    Physical Exam: Vitals:   03/07/23 0421 03/07/23 0426  BP: (!) 158/121   Pulse: 94   Resp: 20   Temp:  98 F (36.7 C)  SpO2: 99%   Weight:  67.1 kg  Height:  5\' 4"  (1.626 m)    Constitutional: Elderly female currently in no acute distress. Eyes: PERRL, lids and conjunctivae normal ENMT: Mucous membranes are moist. Posterior pharynx clear of any exudate or  lesions.Normal dentition.  Neck: normal, supple, no masses, no thyromegaly Respiratory: clear to auscultation bilaterally, no wheezing, no crackles. Normal respiratory effort. No accessory muscle use.  Cardiovascular: Regular rate and rhythm, no murmurs / rubs / gallops. No extremity edema. 2+ pedal pulses. No carotid bruits.  Abdomen: no tenderness, no masses palpated. No hepatosplenomegaly. Bowel sounds positive.  Musculoskeletal: no clubbing / cyanosis. No joint deformity upper and lower extremities. Good ROM, no contractures. Normal muscle tone.  Skin: no rashes, lesions, ulcers. No induration Neurologic: CN 2-12 grossly intact.  Strength 5/5 in all 4.  Mild expressive aphasia. Psychiatric: judgment and insight. Alert and oriented x 3. Normal mood.   Data Reviewed:  EKG revealed sinus rhythm at 97 bpm with possible anterior old infarct, but otherwise similar to prior..  Assessment  and Plan:  Weakness and aphasia   Acute.  Patient presented with complaints of weakness and aphasia.  CT scan of the head did not note any acute abnormality and CTA did not note any large vessel occlusion.  Neurology has been consulted and felt patient possibly could have a possible stroke.  Patient was not a candidate for thrombolytics due to being outside of the window.  Risk factors included hypertension, hyperlipidemia, and DM type II.  Question possible TIA/stroke versus metabolic encephalopathy -Admit to a medical telemetry bed -Neurochecks -Check lipid panel, hemoglobin A1c, TSH -Check MRI of the brain -Check echocardiogram -Check EEG -PT/OT/speech to eval and treat -Aspirin  -Follow-up telemetry  -Appreciate neurology consultative services we will follow-up for any further recommendations  Hyponatremia Acute on chronic.  Sodium noted to be down to 120 and when adjusted for hyperglycemia noted to be 122.  She was reported to have nausea and vomiting prior to arrival.  Patient previously noted to  have low sodium levels when hospitalized last year thought related to vomiting and hydrochlorothiazide.  Patient had initially been given 1 L normal saline IV fluids.  Suspect similar cause of patient's symptoms. -Check urine sodium and urine creatinine -Serial monitoring of sodium levels  -Goal correction no more than 10 mmol/L in 24 hours -Normal saline IV fluids at 75 mL/h.  Adjust IV fluids as needed  Nausea and vomiting Acute.  Patient reported having several episodes of nocturnal vomiting prior to arrival.  No subsequent episodes at this time since being here at the hospital. -Aspiration precautions with elevation head of bed -Diet as tolerated -Antiemetics as needed  Control diabetes mellitus type 2, without long-term use of insulin On admission glucose 194.  Last available hemoglobin A1c from a year ago was 6.6. -Hypoglycemic protocol -Hold oral diabetic medications -CBGs before every meal with sensitive SSI  Hypothyroidism -Follow-up TSH -Continue levothyroxine and consider need of adjustment of dose  Overweight BMI 25.4 kg/m  DVT prophylaxis: Lovenox Advance Care Planning:   Code Status: Full Code    Consults: Neurology  Family Communication: No family requested to be updated.  Severity of Illness: The appropriate patient status for this patient is INPATIENT. Inpatient status is judged to be reasonable and necessary in order to provide the required intensity of service to ensure the patient's safety. The patient's presenting symptoms, physical exam findings, and initial radiographic and laboratory data in the context of their chronic comorbidities is felt to place them at high risk for further clinical deterioration. Furthermore, it is not anticipated that the patient will be medically stable for discharge from the hospital within 2 midnights of admission.   * I certify that at the point of admission it is my clinical judgment that the patient will require inpatient  hospital care spanning beyond 2 midnights from the point of admission due to high intensity of service, high risk for further deterioration and high frequency of surveillance required.*  Author: Clydie Braun, MD 03/07/2023 7:23 AM  For on call review [email or web address].

## 2023-03-07 NOTE — ED Notes (Signed)
ED TO INPATIENT HANDOFF REPORT  ED Nurse Name and Phone #: Donny Pique, RN [phone]  S Name/Age/Gender Kristina [name] 77 y.o. female Room/Bed: 012C/012C  Code Status   Code Status: Full Code  Home/SNF/Other Home Patient oriented to: self, place, time, and situation Is this baseline? Yes   Triage Complete: Triage complete  Chief Complaint Hyponatremia [E87.1]  Triage Note Pt coming in from home. Pt complaining of weakness increasing since 2345. Ems responded to call at [facility]. Pt felt weak , complaining of nausea and vomited. Pt then called again this evening due to increasing weakness chest tightness, and speech issues.  Ems  got an 18 in her left forearm Bp 162/106 Hr 95 98% on rA pt complaining of shob 237 cbg   Allergies Allergies  Allergen Reactions   Ace Inhibitors Cough   Cipro [Ciprofloxacin Hcl] Other (See Comments)    Tendonitis and tendon rupture, achilles tendon issue - posterior right LE   Vibra-Tab [Doxycycline] Other (See Comments)    Unknown reaction   Shellfish Allergy Diarrhea, Nausea And Vomiting, Rash and Other (See Comments)    Purple spots on skin    Level of Care/Admitting Diagnosis ED Disposition     ED Disposition  Admit   Condition  --   Comment  Hospital Area: [facility] [[id number]]  Level of Care: Telemetry Medical [104]  May admit patient to [facility] or [facility] if equivalent level of care is available:: No  Covid Evaluation: Asymptomatic - no recent exposure (last 10 days) testing not required  Diagnosis: Hyponatremia [198519]  Admitting Physician: Clydie Braun [[id number]]  Attending Physician: Clydie Braun [[id number]]  Certification:: I certify this patient will need inpatient services for at least 2 midnights  Estimated Length of Stay: 2          B Medical/Surgery History Past Medical History:  Diagnosis Date   Anxiety    Asthma    Diabetes (HCC)    Diabetes mellitus without complication (HCC)     Diverticulitis    GERD (gastroesophageal reflux disease)    HTN (hypertension)    Hypercholesteremia    Hyperlipidemia    Hypertension    Obesity    Rosacea    Thyroid disease    Ulcerative colitis (HCC)    Past Surgical History:  Procedure Laterality Date   ACHILLES TENDON REPAIR     APPENDECTOMY     BIOPSY  10/16/2018   Procedure: BIOPSY;  Surgeon: Kathi Der, MD;  Location: [facility];  Service: Gastroenterology;;   CESAREAN SECTION     CHOLECYSTECTOMY     FLEXIBLE SIGMOIDOSCOPY N/A 10/16/2018   Procedure: FLEXIBLE SIGMOIDOSCOPY;  Surgeon: Kathi Der, MD;  Location: [facility];  Service: Gastroenterology;  Laterality: N/A;   hammertoe       A IV Location/Drains/Wounds Patient Lines/Drains/Airways Status     Active Line/Drains/Airways     Name Placement date Placement time Site Days   Peripheral IV 03/07/23 20 G Anterior;Left Forearm 03/07/23  0415  Forearm  less than 1            Intake/Output Last 24 hours No intake or output data in the 24 hours ending 03/07/23 1147  Labs/Imaging Results for orders placed or performed during the hospital encounter of 03/07/23 (from the past 48 hour(s))  Basic metabolic panel     Status: Abnormal   Collection Time: 03/07/23  4:28 AM  Result Value Ref Range   Sodium 120 (L) 135 - 145  mmol/L   Potassium 4.1 3.5 - 5.1 mmol/L    Comment: HEMOLYSIS AT THIS LEVEL MAY AFFECT RESULT   Chloride 82 (L) 98 - 111 mmol/L   CO2 20 (L) 22 - 32 mmol/L   Glucose, Bld 194 (H) 70 - 99 mg/dL    Comment: Glucose reference range applies only to samples taken after fasting for at least 8 hours.   BUN 9 8 - 23 mg/dL   Creatinine, Ser 3.29 0.44 - 1.00 mg/dL   Calcium 9.5 8.9 - 51.8 mg/dL   GFR, Estimated >84 >16 mL/min    Comment: (NOTE) Calculated using the CKD-EPI Creatinine Equation (2021)    Anion gap 18 (H) 5 - 15    Comment: Performed at [facility], [address], [place], [place] [address]  CBC     Status:  Abnormal   Collection Time: 03/07/23  4:28 AM  Result Value Ref Range   WBC 7.8 4.0 - 10.5 K/uL   RBC 4.31 3.87 - 5.11 MIL/uL   Hemoglobin 13.8 12.0 - 15.0 g/dL   HCT 16.0 10.9 - 32.3 %   MCV 87.5 80.0 - 100.0 fL   MCH 32.0 26.0 - 34.0 pg   MCHC 36.6 (H) 30.0 - 36.0 g/dL   RDW 55.7 32.2 - 02.5 %   Platelets 261 150 - 400 K/uL   nRBC 0.0 0.0 - 0.2 %    Comment: Performed at [facility], [address], [place], [place] [address]  Troponin I (High Sensitivity)     Status: Abnormal   Collection Time: 03/07/23  4:28 AM  Result Value Ref Range   Troponin I (High Sensitivity) 21 (H) <18 ng/L    Comment: (NOTE) Elevated high sensitivity troponin I (hsTnI) values and significant  changes across serial measurements may suggest ACS but many other  chronic and acute conditions are known to elevate hsTnI results.  Refer to the "Links" section for chest pain algorithms and additional  guidance. Performed at [facility], [address] [place], [place] [address]   Differential     Status: Abnormal   Collection Time: 03/07/23  4:28 AM  Result Value Ref Range   Neutrophils Relative % 88 %   Neutro Abs 6.9 1.7 - 7.7 K/uL   Lymphocytes Relative 7 %   Lymphs Abs 0.5 (L) 0.7 - 4.0 K/uL   Monocytes Relative 4 %   Monocytes Absolute 0.3 0.1 - 1.0 K/uL   Eosinophils Relative 0 %   Eosinophils Absolute 0.0 0.0 - 0.5 K/uL   Basophils Relative 1 %   Basophils Absolute 0.0 0.0 - 0.1 K/uL   Immature Granulocytes 0 %   Abs Immature Granulocytes 0.03 0.00 - 0.07 K/uL    Comment: Performed at [facility], [address], [place], [place] [address]  I-stat chem 8, ED     Status: Abnormal   Collection Time: 03/07/23  4:35 AM  Result Value Ref Range   Sodium 119 (LL) 135 - 145 mmol/L   Potassium 4.2 3.5 - 5.1 mmol/L   Chloride 84 (L) 98 - 111 mmol/L   BUN 10 8 - 23 mg/dL   Creatinine, Ser 7.61 0.44 - 1.00 mg/dL   Glucose, Bld 607 (H) 70 - 99 mg/dL    Comment: Glucose reference  range applies only to samples taken after fasting for at least 8 hours.   Calcium, Ion 1.03 (L) 1.15 - 1.40 mmol/L   TCO2 23 22 -  32 mmol/L   Hemoglobin 14.6 12.0 - 15.0 g/dL   HCT 16.1 09.6 - 04.5 %   Comment NOTIFIED PHYSICIAN   Urine rapid drug screen (hosp performed)     Status: None   Collection Time: 03/07/23  6:01 AM  Result Value Ref Range   Opiates NONE DETECTED NONE DETECTED   Cocaine NONE DETECTED NONE DETECTED   Benzodiazepines NONE DETECTED NONE DETECTED   Amphetamines NONE DETECTED NONE DETECTED   Tetrahydrocannabinol NONE DETECTED NONE DETECTED   Barbiturates NONE DETECTED NONE DETECTED    Comment: (NOTE) DRUG SCREEN FOR MEDICAL PURPOSES ONLY.  IF CONFIRMATION IS NEEDED FOR ANY PURPOSE, NOTIFY LAB WITHIN 5 DAYS.  LOWEST DETECTABLE LIMITS FOR URINE DRUG SCREEN Drug Class                     Cutoff (ng/mL) Amphetamine and metabolites    1000 Barbiturate and metabolites    200 Benzodiazepine                 200 Opiates and metabolites        300 Cocaine and metabolites        300 THC                            50 Performed at [facility], [address] [place], [place] [address]   Urinalysis, Routine w reflex microscopic -Urine, Clean Catch     Status: Abnormal   Collection Time: 03/07/23  6:01 AM  Result Value Ref Range   Color, Urine STRAW (A) YELLOW   APPearance HAZY (A) CLEAR   Specific Gravity, Urine 1.017 1.005 - 1.030   pH 7.0 5.0 - 8.0   Glucose, UA 150 (A) NEGATIVE mg/dL   Hgb urine dipstick NEGATIVE NEGATIVE   Bilirubin Urine NEGATIVE NEGATIVE   Ketones, ur 20 (A) NEGATIVE mg/dL   Protein, ur NEGATIVE NEGATIVE mg/dL   Nitrite NEGATIVE NEGATIVE   Leukocytes,Ua NEGATIVE NEGATIVE    Comment: Performed at [facility], [address], [place], [place] [address]  Ethanol     Status: None   Collection Time: 03/07/23  6:34 AM  Result Value Ref Range   Alcohol, Ethyl (B) <10 <10 mg/dL    Comment: (NOTE) Lowest detectable limit for  serum alcohol is 10 mg/dL.  For medical purposes only. Performed at [facility], [address] [place], [place] [address]   Protime-INR     Status: None   Collection Time: 03/07/23  6:34 AM  Result Value Ref Range   Prothrombin Time 14.8 11.4 - 15.2 seconds   INR 1.1 0.8 - 1.2    Comment: (NOTE) INR goal varies based on device and disease states. Performed at [facility], [address], [place], [place] [address]   APTT     Status: None   Collection Time: 03/07/23  6:34 AM  Result Value Ref Range   aPTT 28 24 - 36 seconds    Comment: Performed at [facility], [address] [place], [place] [address]  Troponin I (High Sensitivity)     Status: Abnormal   Collection Time: 03/07/23  6:34 AM  Result Value Ref Range   Troponin I (High Sensitivity) 27 (H) <18 ng/L    Comment: (NOTE) Elevated high sensitivity troponin I (hsTnI) values and significant  changes across serial measurements may suggest ACS but many other  chronic  and acute conditions are known to elevate hsTnI results.  Refer to the "Links" section for chest pain algorithms and additional  guidance. Performed at [facility], [address] [place], [place] [address]   CBG monitoring, ED     Status: Abnormal   Collection Time: 03/07/23 10:55 AM  Result Value Ref Range   Glucose-Capillary 202 (H) 70 - 99 mg/dL    Comment: Glucose reference range applies only to samples taken after fasting for at least 8 hours.  CBG monitoring, ED     Status: Abnormal   Collection Time: 03/07/23 11:44 AM  Result Value Ref Range   Glucose-Capillary 174 (H) 70 - 99 mg/dL    Comment: Glucose reference range applies only to samples taken after fasting for at least 8 hours.   MR BRAIN WO CONTRAST  Result Date: 03/07/2023 CLINICAL DATA:  Word-finding difficulty. EXAM: MRI HEAD WITHOUT CONTRAST TECHNIQUE: Multiplanar, multiecho pulse sequences of the brain and surrounding structures were obtained without  intravenous contrast. COMPARISON:  Same-day CT/CTA head and neck FINDINGS: Brain: There is no acute intracranial hemorrhage, extra-axial fluid collection, or acute infarct Background parenchymal volume is within expected limits for age. The ventricles are stable in size. 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 The pituitary and suprasellar region are normal. There is no mass lesion. There is no mass effect or midline shift. Vascular: Normal flow voids. Skull and upper cervical spine: Normal marrow signal. Sinuses/Orbits: The paranasal sinuses are clear. Bilateral lens implants are in place. The globes and orbits are otherwise unremarkable. Other: The mastoid air cells and middle ear cavities are clear. IMPRESSION: No acute intracranial pathology. Electronically Signed   By: Lesia Hausen M.D.   On: 03/07/2023 10:02   DG Chest Port 1 View  Result Date: 03/07/2023 CLINICAL DATA:  77 year old female with history of shortness of breath. EXAM: PORTABLE CHEST 1 VIEW COMPARISON:  Chest x-ray 02/27/2022. FINDINGS: Lung volumes are normal. No consolidative airspace disease. No pleural effusions. No pneumothorax. No evidence of pulmonary edema. Heart size is normal. Upper mediastinal contours are distorted by patient's rotation to the left. IMPRESSION: 1. No radiographic evidence of acute cardiopulmonary disease. Electronically Signed   By: Trudie Reed M.D.   On: 03/07/2023 06:02   CT ANGIO HEAD NECK W WO CM (CODE STROKE)  Result Date: 03/07/2023 CLINICAL DATA:  77 year old female code stroke presentation. Wernicke aphasia. EXAM: CT ANGIOGRAPHY HEAD AND NECK TECHNIQUE: Multidetector CT imaging of the head and neck was performed using the standard protocol during bolus administration of intravenous contrast. Multiplanar CT image reconstructions and MIPs were obtained to evaluate the vascular anatomy.  Carotid stenosis measurements (when applicable) are obtained utilizing NASCET criteria, using the distal internal carotid diameter as the denominator. RADIATION DOSE REDUCTION: This exam was performed according to the departmental dose-optimization program which includes automated exposure control, adjustment of the mA and/or kV according to patient size and/or use of iterative reconstruction technique. CONTRAST:  75mL OMNIPAQUE IOHEXOL 350 MG/ML SOLN COMPARISON:  Plain head CT 0443 hours today. FINDINGS: CTA NECK Skeleton: No acute osseous abnormality identified. Osteopenia. Mild for age cervical spine degeneration. Upper chest: Negative. Other neck: Negative. Aortic arch: 3 vessel arch.  Mild for age arch atherosclerosis. Right carotid system: Negative brachiocephalic artery and right CCA origin. Mildly tortuous right CCA. Mild soft and calcified plaque combined at the right ICA origin and bulb but no stenosis. Left  carotid system: Mild tortuosity with no plaque or stenosis to the skull base. Vertebral arteries: Mild calcified right subclavian artery origin. Normal right vertebral artery origin. Right vertebral appears mildly dominant, mildly ectatic, and is widely patent to the skull base without stenosis. Minimal proximal left subclavian artery plaque. Normal left vertebral artery origin. Fairly codominant left vertebral artery is also mildly tortuous and widely patent to the skull base with no stenosis. CTA HEAD Posterior circulation: Bilateral vertebral artery V4 segment calcified plaque. Mild left and mild-to-moderate right V4 segment stenosis (series 8, image 141). Patent vertebrobasilar junction. Patent PICA origins. Patent basilar artery with mild ectasia and no stenosis. Patent SCA and PCA origins. Posterior communicating arteries are diminutive or absent. Bilateral PCA branches are within normal limits. Anterior circulation: Both ICA siphons are patent. Both siphons are mildly ectatic. There is mild to  moderate calcified plaque bilaterally, but no siphon stenosis. Patent carotid termini, MCA and ACA origins. Diminutive or absent anterior communicating artery. Bilateral ACA branches are normal aside from mild tortuosity. Right MCA M1 segment and bifurcation are patent without stenosis. Right MCA branches are within normal limits. Left MCA M1 segment and trifurcation are patent without stenosis. No left MCA branch occlusion is identified, left MCA branches appear within normal limits. Venous sinuses: Early contrast timing, not evaluated. Anatomic variants: None. Review of the MIP images confirms the above findings IMPRESSION: 1. Negative for large vessel occlusion. 2. Mild for age atherosclerosis, especially in the neck. Calcified ICA siphon plaque with no significant stenosis. Calcified plaque of both distal vertebral arteries with up to Moderate Right and mild left V4 vertebral stenosis. Otherwise negative intracranial circulation. Preliminary report of no LVO discussed by telephone with Dr. Caryl Pina on 03/07/2023 at 0508 hours. Electronically Signed   By: Odessa Fleming M.D.   On: 03/07/2023 05:18   CT HEAD CODE STROKE WO CONTRAST  Result Date: 03/07/2023 CLINICAL DATA:  Code stroke. 77 year old female. Vomiting and abnormal speech. EXAM: CT HEAD WITHOUT CONTRAST TECHNIQUE: Contiguous axial images were obtained from the base of the skull through the vertex without intravenous contrast. RADIATION DOSE REDUCTION: This exam was performed according to the departmental dose-optimization program which includes automated exposure control, adjustment of the mA and/or kV according to patient size and/or use of iterative reconstruction technique. COMPARISON:  Head CT 02/22/2022. FINDINGS: Brain: Intermittent motion artifact. Cerebral volume appears stable from last year. No midline shift, mass effect, or evidence of intracranial mass lesion. No ventriculomegaly. No acute intracranial hemorrhage identified. Patchy bilateral  cerebral white matter hypodensity appears grossly stable. No acute cortically based infarct is evident. Basilar cisterns appear normal. Vascular: No suspicious intracranial vascular hyperdensity. Calcified atherosclerosis at the skull base. Skull: Intermittent motion artifact. No acute osseous abnormality identified. Sinuses/Orbits: Visualized paranasal sinuses and mastoids are stable and well aerated. Other: Rightward gaze similar to the prior exam. No other acute orbit or scalp soft tissue finding. ASPECTS [facility] Stroke Program Early CT Score) Total score (0-10 with 10 being normal): 10 IMPRESSION: 1. Mildly motion degraded exam. No acute cortically based infarct or acute intracranial hemorrhage identified. ASPECTS 10. 2. These results were communicated to Dr. Otelia Limes at 4:47 am on 03/07/2023 by text page via the [facility] messaging system. Electronically Signed   By: Odessa Fleming M.D.   On: 03/07/2023 04:48    Pending Labs Unresulted Labs (From admission, onward)     Start     Ordered   03/07/23 1554  Basic metabolic panel  Once,   TIMED  03/07/23 1554   03/07/23 0752  Basic metabolic panel  Now then every 6 hours,   R (with TIMED occurrences)      03/07/23 0751   03/07/23 0748  Osmolality, urine  Once,   R        03/07/23 0747   03/07/23 0748  Osmolality  Once,   R        03/07/23 0747   03/07/23 0747  TSH  Once,   R        03/07/23 0747   03/07/23 0747  Sodium, urine, random  Once,   R        03/07/23 0747   03/07/23 0745  Lipid panel  (Labs)  Once,   R       Comments: Fasting    03/07/23 0747   03/07/23 0743  Hemoglobin A1c  (Labs)  Once,   R       Comments: To assess prior glycemic control    03/07/23 0747            Vitals/Pain Today's Vitals   03/07/23 0426 03/07/23 0800 03/07/23 0823 03/07/23 1102  BP:  122/82  (!) 147/89  Pulse:  89  87  Resp:  18  18  Temp: 98 F (36.7 C)  98 F (36.7 C)   TempSrc:   Oral   SpO2:  95%  96%  Weight: 67.1 kg     Height: 5\' 4"   (1.626 m)     PainSc:   0-No pain     Isolation Precautions No active isolations  Medications Medications  aspirin EC tablet 81 mg (has no administration in time range)   stroke: early stages of recovery book (has no administration in time range)  0.9 %  sodium chloride infusion ( Intravenous New Bag/Given 03/07/23 1103)  acetaminophen (TYLENOL) tablet 650 mg (has no administration in time range)    Or  acetaminophen (TYLENOL) 160 MG/5ML solution 650 mg (has no administration in time range)    Or  acetaminophen (TYLENOL) suppository 650 mg (has no administration in time range)  senna-docusate (Senokot-S) tablet 1 tablet (has no administration in time range)  enoxaparin (LOVENOX) injection 40 mg (40 mg Subcutaneous Given 03/07/23 1103)  insulin aspart (novoLOG) injection 0-9 Units ( Subcutaneous Not Given 03/07/23 1106)  insulin aspart (novoLOG) injection 0-5 Units (has no administration in time range)  simvastatin (ZOCOR) tablet 20 mg (has no administration in time range)  sodium chloride 0.9 % bolus 1,000 mL (0 mLs Intravenous Stopped 03/07/23 0754)  iohexol (OMNIPAQUE) 350 MG/ML injection 75 mL (75 mLs Intravenous Contrast Given 03/07/23 0501)  aspirin EC tablet 650 mg (650 mg Oral Given 03/07/23 0606)  ondansetron (ZOFRAN) injection 4 mg (4 mg Intravenous Given 03/07/23 0602)  prochlorperazine (COMPAZINE) injection 5 mg (5 mg Intravenous Given 03/07/23 0602)  diphenhydrAMINE (BENADRYL) injection 12.5 mg (12.5 mg Intravenous Given 03/07/23 0602)    Mobility walks     Focused Assessments    R Recommendations: See Admitting Provider Note  Report given to:   Additional Notes: Patient is A&Ox4, currently getting EEG, MRI and other imaging negative. No deficits at this time.

## 2023-03-07 NOTE — Procedures (Signed)
Patient Name: Kristina [name]  MRN: [id number]  Epilepsy Attending: Charlsie Quest  Referring Physician/Provider: Marvel Plan, MD  Date: 03/07/2023  Duration: 26.58 mins  Patient history: 77yo f with ams getting eeg to evaluate for seizure  Level of alertness: Awake  AEDs during EEG study: None  Technical aspects: This EEG study was done with scalp electrodes positioned according to the 10-20 International system of electrode placement. Electrical activity was reviewed with band pass filter of 1-70Hz , sensitivity of 7 uV/mm, display speed of 70mm/sec with a 60Hz  notched filter applied as appropriate. EEG data were recorded continuously and digitally stored.  Video monitoring was available and reviewed as appropriate.  Description: The posterior dominant rhythm consists of 6-7 Hz activity of moderate voltage (25-35 uV) seen predominantly in posterior head regions, symmetric and reactive to eye opening and eye closing. EEG showed continuous generalized polymorphic sharply contoured 3 to 6 Hz theta-delta slowing. Hyperventilation and photic stimulation were not performed.     ABNORMALITY - Continuous slow, generalized  IMPRESSION: This study is suggestive of moderate diffuse encephalopathy, nonspecific etiology. No seizures or epileptiform discharges were seen throughout the recording.   Annabelle Harman

## 2023-03-07 NOTE — ED Notes (Signed)
Pt currently in echo at this time

## 2023-03-08 DIAGNOSIS — E871 Hypo-osmolality and hyponatremia: Secondary | ICD-10-CM | POA: Diagnosis not present

## 2023-03-08 LAB — GLUCOSE, CAPILLARY
Glucose-Capillary: 128 mg/dL — ABNORMAL HIGH (ref 70–99)
Glucose-Capillary: 129 mg/dL — ABNORMAL HIGH (ref 70–99)
Glucose-Capillary: 79 mg/dL (ref 70–99)
Glucose-Capillary: 144 mg/dL — ABNORMAL HIGH (ref 70–99)

## 2023-03-08 LAB — SODIUM, URINE, RANDOM: Sodium, Ur: 11 mmol/L

## 2023-03-08 LAB — BASIC METABOLIC PANEL
Anion gap: 12 (ref 5–15)
Anion gap: 11 (ref 5–15)
BUN: 14 mg/dL (ref 8–23)
BUN: 16 mg/dL (ref 8–23)
CO2: 22 mmol/L (ref 22–32)
CO2: 21 mmol/L — ABNORMAL LOW (ref 22–32)
Calcium: 9.1 mg/dL (ref 8.9–10.3)
Calcium: 8.9 mg/dL (ref 8.9–10.3)
Chloride: 96 mmol/L — ABNORMAL LOW (ref 98–111)
Chloride: 96 mmol/L — ABNORMAL LOW (ref 98–111)
Creatinine, Ser: 0.82 mg/dL (ref 0.44–1.00)
Creatinine, Ser: 1.05 mg/dL — ABNORMAL HIGH (ref 0.44–1.00)
GFR, Estimated: >60 mL/min (ref >60)
GFR, Estimated: 54 mL/min — ABNORMAL LOW (ref >60)
Glucose, Bld: 88 mg/dL (ref 70–99)
Glucose, Bld: 243 mg/dL — ABNORMAL HIGH (ref 70–99)
Potassium: 4.1 mmol/L (ref 3.5–5.1)
Potassium: 4.3 mmol/L (ref 3.5–5.1)
Sodium: 130 mmol/L — ABNORMAL LOW (ref 135–145)
Sodium: 128 mmol/L — ABNORMAL LOW (ref 135–145)

## 2023-03-08 LAB — OSMOLALITY, URINE: Osmolality, Ur: 203 mOsm/kg — ABNORMAL LOW (ref 300–900)

## 2023-03-08 MED ORDER — LEVOTHYROXINE SODIUM 50 MCG PO TABS
50.0000 ug | ORAL_TABLET | Freq: Every morning | ORAL | Status: DC
  Administered 2023-03-08 – 2023-03-09 (×2): 50 ug via ORAL
  Filled 2023-03-08 (×2): qty 1

## 2023-03-08 MED ORDER — SODIUM CHLORIDE 0.9 % IV SOLN: INTRAVENOUS | Status: DC

## 2023-03-08 MED ORDER — MESALAMINE ER 0.375 G PO CP24: 1.1250 g | ORAL_CAPSULE | Freq: Every day | ORAL | Status: DC

## 2023-03-08 MED ORDER — DONEPEZIL HCL 10 MG PO TABS
5.0000 mg | ORAL_TABLET | Freq: Every day | ORAL | Status: DC
  Administered 2023-03-08 – 2023-03-09 (×2): 5 mg via ORAL
  Filled 2023-03-08 (×2): qty 1

## 2023-03-08 MED ORDER — HYOSCYAMINE SULFATE 0.125 MG PO TABS: 0.0625 mg | ORAL_TABLET | Freq: Two times a day (BID) | ORAL | Status: DC | PRN

## 2023-03-08 MED ORDER — VITAMIN B-12 1000 MCG PO TABS
500.0000 ug | ORAL_TABLET | Freq: Every day | ORAL | Status: DC
  Administered 2023-03-08 – 2023-03-09 (×2): 500 ug via ORAL
  Filled 2023-03-08 (×2): qty 1

## 2023-03-08 NOTE — Progress Notes (Signed)
SLP Cancellation Note  Patient Details Name: Kristina [name] MRN: [id number] DOB: 05/26/1946   Cancelled treatment:        Orders for cognitive linguistic evaluation received and appreciated.  Spoke with RN briefly.  Pt is not exhibiting any communication deficits.  MRI negative for acute findings.  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  Neuro consult this admission with pt close to baseline.  Evaluation deferred at this time.  Please re-consult ST if there is a change in functional status.   Kerrie Pleasure, MA, CCC-SLP Acute Rehabilitation Services Office: [phone] 03/08/2023, 11:18 AM

## 2023-03-08 NOTE — Hospital Course (Addendum)
77 y.o.f w/ HTN HLD CHFpEF T2DM,hypothyroidism, ulcerative colitis, and GERD presented with complaints of weakness,emesis. EMS was called at home, after evaluation it was decided that she did not need to come to the hospital.  However later on that evening she reported having some left-sided chest discomfort, difficulty breathing, generalized weakness, and reported having trouble getting out what she wanted to say. In the ED see as a code stroke. CT scan>> did not note any acute infarct or intracranial bleed but was motion degraded. CTA of the head and neck>>did not reveal any large vessel occlusion. Patient was not thought to be a tPA candidate. Vitals>>afebrile with blood pressures elevated up to 158/121, and all other vital signs maintained. Labs>> significant for sodium 120, chloride 82, CO2 20, glucose 194, anion gap 18, alcohol level undetectable, INR 1.1, and high-sensitivity troponin 21->27.  Chest x-ray did not note any acute abnormality.  Urinalysis positive for glucose and ketones.  UDS negative.  Patient was given 1 L of normal saline IV fluids, Zofran, Compazine, and Benadryl. Patient was admitted for further workup> acute stroke rule out, managed for hyponatremia likely volume depletion sodium appropriately increased she does have chronic history of hyponatremia at this time sodium stable urine SHOWS urine sodium is low indicating hypovolemia. Nausea vomiting resolved, at this time doing well with PT OT and no further PT OT needs

## 2023-03-08 NOTE — Evaluation (Signed)
Occupational Therapy Evaluation Patient Details Name: Kristina [name] MRN: [id number] DOB: 07-15-1946 Today's Date: 03/08/2023   History of Present Illness Pt is a 77 y.o. female presenting 03/07/2023 with difficulty word finding and L weakness. MRI brain with no acute intracranial abnormality. Urinalysis positive for glucose and ketones.   PMH significant for HTN, HLD, diastolic CHF, DMII, hypothyroidism, ulcerative colitis, asthma, GERD.   Clinical Impression   PTA, pt lived alone and was mod I in ADL and IADL. Pt reporting recent use of sponge bathing technique due to difficulty getting in and out of tub. Discussed options for safety with tub transfers and pt reporting she would rather get her own tub bench than pay for it out of pocket from acute stay. Discussed locations of acquisition. Pt with decreased LUE dexterity, and balance. Will continue to follow acutely. Recommending OP OT on pt request to optimize safety and independence with ADL and IADL.       If plan is discharge home, recommend the following: Help with stairs or ramp for entrance;A little help with bathing/dressing/bathroom    Functional Status Assessment  Patient has had a recent decline in their functional status and demonstrates the ability to make significant improvements in function in a reasonable and predictable amount of time.  Equipment Recommendations  Other (comment) (discussed tub transfer bench acquisition options with pt.)    Recommendations for Other Services PT consult (balance)     Precautions / Restrictions Precautions Precautions: Fall Restrictions Weight Bearing Restrictions: No      Mobility Bed Mobility Overal bed mobility: Modified Independent                  Transfers Overall transfer level: Needs assistance Equipment used: None Transfers: Sit to/from Stand Sit to Stand: Supervision           General transfer comment: for basic STS      Balance Overall balance  assessment: Needs assistance Sitting-balance support: No upper extremity supported, Feet supported Sitting balance-Leahy Scale: Good     Standing balance support: No upper extremity supported, During functional activity Standing balance-Leahy Scale: Fair Standing balance comment: CGA                           ADL either performed or assessed with clinical judgement   ADL Overall ADL's : Needs assistance/impaired Eating/Feeding: Modified independent;Sitting   Grooming: Supervision/safety;Standing   Upper Body Bathing: Set up;Sitting   Lower Body Bathing: Sit to/from stand;Contact guard assist   Upper Body Dressing : Set up;Sitting   Lower Body Dressing: Contact guard assist;Sit to/from stand   Toilet Transfer: Contact guard assist;Ambulation Toilet Transfer Details (indicate cue type and reason): CGA; guarding only no physical assist needed Toileting- Clothing Manipulation and Hygiene: Supervision/safety;Sitting/lateral lean   Tub/ Shower Transfer: Tub transfer;Minimal assistance;Ambulation   Functional mobility during ADLs: Contact guard assist       Vision Baseline Vision/History: 1 Wears glasses Ability to See in Adequate Light: 0 Adequate Patient Visual Report: No change from baseline Vision Assessment?: No apparent visual deficits     Perception Perception: Within Functional Limits       Praxis Praxis: WFL       Pertinent Vitals/Pain Pain Assessment Pain Assessment: No/denies pain     Extremity/Trunk Assessment Upper Extremity Assessment Upper Extremity Assessment: Left hand dominant LUE Deficits / Details: Recent L hand carpal tunnel surgery. Decresaed dexterity and experiencing numbness LUE Sensation: decreased light touch LUE Coordination: decreased  fine motor   Lower Extremity Assessment Lower Extremity Assessment: Defer to PT evaluation   Cervical / Trunk Assessment Cervical / Trunk Assessment: Normal   Communication  Communication Communication: No apparent difficulties   Cognition Arousal: Alert Behavior During Therapy: WFL for tasks assessed/performed Overall Cognitive Status: Within Functional Limits for tasks assessed                                 General Comments: Some decreased STM, but believes near baseline.     General Comments  VSS    Exercises     Shoulder Instructions      Home Living Family/patient expects to be discharged to:: Private residence Living Arrangements: Alone Available Help at Discharge: Family;Friend(s);Available PRN/intermittently Type of Home: House Home Access: Stairs to enter [employer] of Steps: threshold Entrance Stairs-Rails: None Home Layout: One level     Bathroom Shower/Tub: Tub/shower unit (garden tub)   Bathroom Toilet: Standard     Home Equipment: Cane - single point;Toilet riser;BSC/3in1   Additional Comments: carpal tunnel surgery between 14 and 21 days ago      Prior Functioning/Environment Prior Level of Function : Independent/Modified Independent             Mobility Comments: uses cane for precautionary purposes intermittently. Wears brace on L knee. ADLs Comments: Indep with ADL and IADL        OT Problem List: Decreased strength;Impaired balance (sitting and/or standing);Decreased activity tolerance;Impaired UE functional use      OT Treatment/Interventions: Self-care/ADL training;Therapeutic exercise;DME and/or AE instruction;Patient/family education;Balance training;Therapeutic activities    OT Goals(Current goals can be found in the care plan section) Acute Rehab OT Goals Patient Stated Goal: get better OT Goal Formulation: With patient Time For Goal Achievement: 03/22/23 Potential to Achieve Goals: Good  OT Frequency: Min 1X/week    Co-evaluation              AM-PAC OT "6 Clicks" Daily Activity     Outcome Measure Help from another person eating meals?: None Help from another  person taking care of personal grooming?: A Little Help from another person toileting, which includes using toliet, bedpan, or urinal?: A Little Help from another person bathing (including washing, rinsing, drying)?: A Little Help from another person to put on and taking off regular upper body clothing?: A Little Help from another person to put on and taking off regular lower body clothing?: A Little 6 Click Score: 19   End of Session Equipment Utilized During Treatment: Gait belt Nurse Communication: Mobility status  Activity Tolerance: Patient tolerated treatment well Patient left: with call bell/phone within reach;in chair;with chair alarm set  OT Visit Diagnosis: Unsteadiness on feet (R26.81);Muscle weakness (generalized) (M62.81)                Time: 6578-4696 OT Time Calculation (min): 47 min Charges:  OT General Charges $OT Visit: 1 Visit OT Evaluation $OT Eval Low Complexity: 1 Low OT Treatments $Self Care/Home Management : 23-37 mins  Tyler Deis, OTR/L [facility] Acute Rehabilitation Office: [phone]   Kristina [name] 03/08/2023, 10:42 AM

## 2023-03-08 NOTE — Progress Notes (Signed)
PROGRESS NOTE Kristina [name]  ZOX:[id number] DOB: 02-27-1946 DOA: 03/07/2023 PCP: Shon Hale, MD  Brief Narrative/Hospital Course: 77 y.o.f w/ HTN HLD CHFpEF T2DM,hypothyroidism, ulcerative colitis, and GERD presented with complaints of weakness,emesis. EMS was called at home, after evaluation it was decided that she did not need to come to the hospital.  However later on that evening she reported having some left-sided chest discomfort, difficulty breathing, generalized weakness, and reported having trouble getting out what she wanted to say. In the ED see as a code stroke. CT scan>> did not note any acute infarct or intracranial bleed but was motion degraded. CTA of the head and neck>>did not reveal any large vessel occlusion. Patient was not thought to be a tPA candidate. Vitals>>afebrile with blood pressures elevated up to 158/121, and all other vital signs maintained. Labs>> significant for sodium 120, chloride 82, CO2 20, glucose 194, anion gap 18, alcohol level undetectable, INR 1.1, and high-sensitivity troponin 21->27.  Chest x-ray did not note any acute abnormality.  Urinalysis positive for glucose and ketones.  UDS negative.  Patient was given 1 L of normal saline IV fluids, Zofran, Compazine, and Benadryl. Neurology recommended loading patient with aspirin 650 mg which she received.       Subjective: Patient seen and examined this morning Alert awake oriented relatively asymptomatic resting comfortably Overnight afebrile BP stable on room air Recheck labs shows sodium uptrending at 126  Assessment and Plan: Principal Problem:   Hyponatremia Active Problems:   Aphasia   TIA (transient ischemic attack)   Weakness   Nausea and vomiting   Controlled type 2 diabetes mellitus without complication, without long-term current use of insulin (HCC)   Hypothyroidism   Overweight (BMI 25.0-29.9)   Weakness and aphasia Initially seen as a code stroke, MRI brain no acute  finding likely in the setting of hyponatremia. Symptoms resolved, at this time asymptomatic.EEG moderate diffuse encephalopathy nonspecific etiology.  Echo with EF 55 to 60%, no RWMA normal RV systolic function small pericardial effusion mitral valve no normal aortic valve not well-visualized.  LDL 62 HbA1c 6.7 CT head and neck no LVO. appreciate neurology follow-up.  PT OT  Hyponatremia: 120>119 on admit 8/8 04 am>sodium slowly trending up on ivf as below-about 5-6 point correction in > 24 hours I ordered urine osmole urine sodium, decrease IV fluid hydration to avoid overcorrection -check bp q6hr. On review see has had SIMILAR hyponatremia chronically-a year ago 02/23/22: Na as low as 117 and her discharge was 136-that time hyponatremia developed after admission preceded by nausea vomiting overnight in her house workup unremarkable urine electrolytes consistent with SIADH picture also suspected due to nausea plus thiazide exposure and needed 3% saline and tolvaptan x 1> on discharge advised to discontinue HCTZ and continue low-dose furosemide and free water restriction Recent Labs  Lab 03/07/23 1146 03/07/23 1402 03/07/23 2003 03/08/23 0322 03/08/23 0725  NA 123* 123* 124* 125* 126*    Nausea and vomiting: Currently resolved, advance diet, symptomatic management  HLD: LDL 62 at goal of less than 70, continue home Zocor  HTN: Holding Lasix and valsartan.  Type 2 diabetes mellitus: Controlled HbA1c of 6.7 continue SSI.  Holding home metformin  Hypothyroidism: TSH normal 3.2, resume home Synthroid 50 mcg  MCI: on donepezil followed by neurology Dr. [name] as outpatient  Overweight: Advise weight loss before discharge  DVT prophylaxis: enoxaparin (LOVENOX) injection 40 mg Start: 03/07/23 1000 Code Status:   Code Status: Full Code Family Communication: plan of care  discussed with patient at bedside. Patient status is: Inpatient because of hyponatremia Level of care: Telemetry  Medical   Dispo: The patient is from: Home            Anticipated disposition: TBD pending clinical improvement, PT OT eval requested Objective: Vitals last 24 hrs: Vitals:   03/07/23 1501 03/07/23 2150 03/08/23 0516 03/08/23 0756  BP: (!) 101/56 125/61 121/62 (!) 154/72  Pulse: 81 78 68 75  Resp: 18 16 16 16   Temp: 97.9 F (36.6 C) 98.7 F (37.1 C) 98.3 F (36.8 C) 98.7 F (37.1 C)  TempSrc: Oral     SpO2: 97% 98% 97% 98%  Weight:      Height:       Weight change:   Physical Examination: General exam: alert awake, older than stated age HEENT:Oral mucosa moist, Ear/Nose WNL grossly Respiratory system: bilaterally clear BS, no use of accessory muscle Cardiovascular system: S1 & S2 +, No JVD. Gastrointestinal system: Abdomen soft,NT,ND, BS+ Nervous System:Alert, awake, moving extremities. Extremities: LE edema neg,distal peripheral pulses palpable.  Skin: No rashes,no icterus. MSK: Normal muscle bulk,tone, power  Medications reviewed:  Scheduled Meds:   stroke: early stages of recovery book   Does not apply Once   aspirin EC  81 mg Oral Daily   enoxaparin (LOVENOX) injection  40 mg Subcutaneous Daily   insulin aspart  0-5 Units Subcutaneous QHS   insulin aspart  0-9 Units Subcutaneous TID WC   simvastatin  20 mg Oral QHS   Continuous Infusions:  sodium chloride 50 mL/hr at 03/08/23 1610    Diet Order             Diet Heart Room service appropriate? Yes; Fluid consistency: Thin  Diet effective now                  Intake/Output Summary (Last 24 hours) at 03/08/2023 1106 Last data filed at 03/07/2023 1418 Gross per 24 hour  Intake 159.47 ml  Output --  Net 159.47 ml   Net IO Since Admission: 159.47 mL [03/08/23 1106]  Wt Readings from Last 3 Encounters:  03/07/23 67.1 kg  12/06/22 68.5 kg  05/25/22 67.1 kg     Unresulted Labs (From admission, onward)     Start     Ordered   03/08/23 0820  Osmolality, urine  Once,   R        03/08/23 0819   03/08/23  0820  Sodium, urine, random  Once,   R        03/08/23 0819   03/07/23 0748  Osmolality, urine  Once,   R        03/07/23 0747   03/07/23 0747  Sodium, urine, random  Once,   R        03/07/23 0747          Data Reviewed: I have personally reviewed following labs and imaging studies CBC: Recent Labs  Lab 03/07/23 0428 03/07/23 0435 03/08/23 0725  WBC 7.8  --  3.7*  NEUTROABS 6.9  --   --   HGB 13.8 14.6 11.5*  HCT 37.7 43.0 32.5*  MCV 87.5  --  90.5  PLT 261  --  204   Basic Metabolic Panel: Recent Labs  Lab 03/07/23 1146 03/07/23 1402 03/07/23 2003 03/08/23 0322 03/08/23 0725  NA 123* 123* 124* 125* 126*  K 3.5 3.6 3.7 3.5 3.8  CL 89* 86* 89* 90* 95*  CO2 20* 25 24 24  23  GLUCOSE 179* 169* 162* 138* 134*  BUN 8 10 15 18 15   CREATININE 0.66 1.11* 1.03* 0.95 0.78  CALCIUM 9.1 9.2 9.1 9.0 8.7*   Coagulation Profile: Recent Labs  Lab April 01, 2023 0634  INR 1.1   Recent Labs    2023/04/01 1146  CHOL 128  HDL 53  LDLCALC 62  TRIG 64  CHOLHDL 2.4   Thyroid Function Tests: Recent Labs    04-01-2023 1146  TSH 3.298   Sepsis Labs: No results for input(s): "PROCALCITON", "LATICACIDVEN" in the last 168 hours.  No results found for this or any previous visit (from the past 240 hour(s)).  Antimicrobials: Anti-infectives (From admission, onward)    None      Culture/Microbiology    Component Value Date/Time   SDES  02/23/2022 1746    URINE, CATHETERIZED Performed at [facility], [address], [place], [place] [address]    SPECREQUEST  02/23/2022 1746    NONE Performed at [facility], [address] [place], [place] [address]    CULT  02/23/2022 1746    NO GROWTH Performed at [facility], [address], [place], [place] [address]    REPTSTATUS 02/25/2022 FINAL 02/23/2022 1746    Radiology Studies: EEG adult  Result Date: 01-Apr-2023 Charlsie Quest, MD     April 01, 2023  5:53 PM Patient Name: Kristina [name] MRN: [id number] Epilepsy Attending: Charlsie Quest Referring Physician/Provider: Marvel Plan, MD Date: 2023/04/01 Duration: 26.58 mins Patient history: 77yo f with ams getting eeg to evaluate for seizure Level of alertness: Awake AEDs during EEG study: None Technical aspects: This EEG study was done with scalp electrodes positioned according to the 10-20 International system of electrode placement. Electrical activity was reviewed with band pass filter of 1-70Hz , sensitivity of 7 uV/mm, display speed of 6mm/sec with a 60Hz  notched filter applied as appropriate. EEG data were recorded continuously and digitally stored.  Video monitoring was available and reviewed as appropriate. Description: The posterior dominant rhythm consists of 6-7 Hz activity of moderate voltage (25-35 uV) seen predominantly in posterior head regions, symmetric and reactive to eye opening and eye closing. EEG showed continuous generalized polymorphic sharply contoured 3 to 6 Hz theta-delta slowing. Hyperventilation and photic stimulation were not performed.   ABNORMALITY - Continuous slow, generalized IMPRESSION: This study is suggestive of moderate diffuse encephalopathy, nonspecific etiology. No seizures or epileptiform discharges were seen throughout the recording. Charlsie Quest   ECHOCARDIOGRAM COMPLETE  Result Date: 04-01-2023    ECHOCARDIOGRAM REPORT   Patient Name:   LOEY HALDERMAN Date of Exam: 04-01-2023 Medical Rec #:  [id number]        Height:       64.0 in Accession #:    [id number]       Weight:       148.0 lb Date of Birth:  [birth year]         BSA:          1.721 m Patient Age:    77 years         BP:           122/82 mmHg Patient Gender: F                HR:           87 bpm. Exam Location:  Inpatient Procedure: 2D Echo, Color Doppler and Cardiac Doppler Indications:    Stroke i63.9  History:  Patient has prior history of Echocardiogram examinations, most                 recent 02/23/2022. CHF; Risk  Factors:Hypertension, Diabetes and                 Dyslipidemia.  Sonographer:    Irving Burton Senior RDCS Referring Phys: Clydie Braun IMPRESSIONS  1. Left ventricular ejection fraction, by estimation, is 55 to 60%. The left ventricle has normal function. The left ventricle has no regional wall motion abnormalities. Indeterminate diastolic filling due to E-A fusion.  2. Right ventricular systolic function is normal. The right ventricular size is normal. Tricuspid regurgitation signal is inadequate for assessing PA pressure.  3. A small pericardial effusion is present. The pericardial effusion is anterior to the right ventricle and surrounding the apex.  4. The mitral valve is normal in structure. No evidence of mitral valve regurgitation.  5. The aortic valve was not well visualized. There is mild calcification of the aortic valve. Aortic valve regurgitation is not visualized. Aortic valve sclerosis is present, with no evidence of aortic valve stenosis.  6. The inferior vena cava is normal in size with greater than 50% respiratory variability, suggesting right atrial pressure of 3 mmHg. Conclusion(s)/Recommendation(s): No intracardiac source of embolism detected on this transthoracic study. Consider a transesophageal echocardiogram to exclude cardiac source of embolism if clinically indicated. FINDINGS  Left Ventricle: Left ventricular ejection fraction, by estimation, is 55 to 60%. The left ventricle has normal function. The left ventricle has no regional wall motion abnormalities. The left ventricular internal cavity size was normal in size. There is  borderline asymmetric left ventricular hypertrophy of the basal-septal segment. Indeterminate diastolic filling due to E-A fusion. Right Ventricle: The right ventricular size is normal. No increase in right ventricular wall thickness. Right ventricular systolic function is normal. Tricuspid regurgitation signal is inadequate for assessing PA pressure. Left Atrium: Left  atrial size was normal in size. Right Atrium: Right atrial size was normal in size. Pericardium: A small pericardial effusion is present. The pericardial effusion is anterior to the right ventricle and surrounding the apex. Presence of epicardial fat layer. Mitral Valve: The mitral valve is normal in structure. No evidence of mitral valve regurgitation. Tricuspid Valve: The tricuspid valve is normal in structure. Tricuspid valve regurgitation is not demonstrated. Aortic Valve: The aortic valve was not well visualized. There is mild calcification of the aortic valve. Aortic valve regurgitation is not visualized. Aortic valve sclerosis is present, with no evidence of aortic valve stenosis. Pulmonic Valve: The pulmonic valve was not well visualized. Pulmonic valve regurgitation is not visualized. Aorta: The aortic root is normal in size and structure and the ascending aorta was not well visualized. Venous: The inferior vena cava is normal in size with greater than 50% respiratory variability, suggesting right atrial pressure of 3 mmHg. IAS/Shunts: The atrial septum is grossly normal.  LEFT VENTRICLE PLAX 2D LVIDd:         3.70 cm   Diastology LVIDs:         2.60 cm   LV e' medial:    3.15 cm/s LV PW:         0.90 cm   LV E/e' medial:  16.3 LV IVS:        1.00 cm   LV e' lateral:   3.37 cm/s LVOT diam:     1.90 cm   LV E/e' lateral: 15.3 LV SV:  47 LV SV Index:   27 LVOT Area:     2.84 cm  RIGHT VENTRICLE RV S prime:     14.80 cm/s TAPSE (M-mode): 1.8 cm LEFT ATRIUM             Index        RIGHT ATRIUM           Index LA diam:        2.80 cm 1.63 cm/m   RA Area:     11.00 cm LA Vol (A2C):   32.9 ml 19.11 ml/m  RA Volume:   22.90 ml  13.30 ml/m LA Vol (A4C):   38.8 ml 22.54 ml/m LA Biplane Vol: 38.2 ml 22.19 ml/m  AORTIC VALVE LVOT Vmax:   87.90 cm/s LVOT Vmean:  66.700 cm/s LVOT VTI:    0.166 m  AORTA Ao Root diam: 2.80 cm MITRAL VALVE MV Area (PHT): 3.01 cm     SHUNTS MV Decel Time: 252 msec      Systemic VTI:  0.17 m MV E velocity: 51.40 cm/s   Systemic Diam: 1.90 cm MV A velocity: 105.00 cm/s MV E/A ratio:  0.49 Weston Brass MD Electronically signed by Weston Brass MD Signature Date/Time: 03/07/2023/12:25:24 PM    Final    MR BRAIN WO CONTRAST  Result Date: 03/07/2023 CLINICAL DATA:  Word-finding difficulty. EXAM: MRI HEAD WITHOUT CONTRAST TECHNIQUE: Multiplanar, multiecho pulse sequences of the brain and surrounding structures were obtained without intravenous contrast. COMPARISON:  Same-day CT/CTA head and neck FINDINGS: Brain: There is no acute intracranial hemorrhage, extra-axial fluid collection, or acute infarct Background parenchymal volume is within expected limits for age. The ventricles are stable in size. 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 The pituitary and suprasellar region are normal. There is no mass lesion. There is no mass effect or midline shift. Vascular: Normal flow voids. Skull and upper cervical spine: Normal marrow signal. Sinuses/Orbits: The paranasal sinuses are clear. Bilateral lens implants are in place. The globes and orbits are otherwise unremarkable. Other: The mastoid air cells and middle ear cavities are clear. IMPRESSION: No acute intracranial pathology. Electronically Signed   By: Lesia Hausen M.D.   On: 03/07/2023 10:02   DG Chest Port 1 View  Result Date: 03/07/2023 CLINICAL DATA:  77 year old female with history of shortness of breath. EXAM: PORTABLE CHEST 1 VIEW COMPARISON:  Chest x-ray 02/27/2022. FINDINGS: Lung volumes are normal. No consolidative airspace disease. No pleural effusions. No pneumothorax. No evidence of pulmonary edema. Heart size is normal. Upper mediastinal contours are distorted by patient's rotation to the left. IMPRESSION: 1. No radiographic evidence of acute cardiopulmonary disease. Electronically Signed   By: Trudie Reed M.D.   On: 03/07/2023 06:02   CT ANGIO HEAD NECK W WO CM (CODE STROKE)  Result Date: 03/07/2023 CLINICAL DATA:  77 year old female code stroke presentation. Wernicke aphasia. EXAM: CT ANGIOGRAPHY HEAD AND NECK TECHNIQUE: Multidetector CT imaging of the head and neck was performed using the standard protocol during bolus administration of intravenous contrast. Multiplanar CT image reconstructions and MIPs were obtained to evaluate the vascular anatomy. Carotid stenosis measurements (when applicable) are obtained utilizing NASCET criteria, using the distal internal carotid diameter as the denominator. RADIATION DOSE REDUCTION: This exam was performed according to the departmental dose-optimization program which includes automated exposure control, adjustment of the mA and/or kV according to patient size and/or use of iterative reconstruction technique.  CONTRAST:  75mL OMNIPAQUE IOHEXOL 350 MG/ML SOLN COMPARISON:  Plain head CT 0443 hours today. FINDINGS: CTA NECK Skeleton: No acute osseous abnormality identified. Osteopenia. Mild for age cervical spine degeneration. Upper chest: Negative. Other neck: Negative. Aortic arch: 3 vessel arch.  Mild for age arch atherosclerosis. Right carotid system: Negative brachiocephalic artery and right CCA origin. Mildly tortuous right CCA. Mild soft and calcified plaque combined at the right ICA origin and bulb but no stenosis. Left carotid system: Mild tortuosity with no plaque or stenosis to the skull base. Vertebral arteries: Mild calcified right subclavian artery origin. Normal right vertebral artery origin. Right vertebral appears mildly dominant, mildly ectatic, and is widely patent to the skull base without stenosis. Minimal proximal left subclavian artery plaque. Normal left vertebral artery origin. Fairly codominant left vertebral artery is also mildly tortuous and widely patent to the skull base with no stenosis. CTA HEAD Posterior circulation: Bilateral  vertebral artery V4 segment calcified plaque. Mild left and mild-to-moderate right V4 segment stenosis (series 8, image 141). Patent vertebrobasilar junction. Patent PICA origins. Patent basilar artery with mild ectasia and no stenosis. Patent SCA and PCA origins. Posterior communicating arteries are diminutive or absent. Bilateral PCA branches are within normal limits. Anterior circulation: Both ICA siphons are patent. Both siphons are mildly ectatic. There is mild to moderate calcified plaque bilaterally, but no siphon stenosis. Patent carotid termini, MCA and ACA origins. Diminutive or absent anterior communicating artery. Bilateral ACA branches are normal aside from mild tortuosity. Right MCA M1 segment and bifurcation are patent without stenosis. Right MCA branches are within normal limits. Left MCA M1 segment and trifurcation are patent without stenosis. No left MCA branch occlusion is identified, left MCA branches appear within normal limits. Venous sinuses: Early contrast timing, not evaluated. Anatomic variants: None. Review of the MIP images confirms the above findings IMPRESSION: 1. Negative for large vessel occlusion. 2. Mild for age atherosclerosis, especially in the neck. Calcified ICA siphon plaque with no significant stenosis. Calcified plaque of both distal vertebral arteries with up to Moderate Right and mild left V4 vertebral stenosis. Otherwise negative intracranial circulation. Preliminary report of no LVO discussed by telephone with Dr. Caryl Pina on 03/07/2023 at 0508 hours. Electronically Signed   By: Odessa Fleming M.D.   On: 03/07/2023 05:18   CT HEAD CODE STROKE WO CONTRAST  Result Date: 03/07/2023 CLINICAL DATA:  Code stroke. 77 year old female. Vomiting and abnormal speech. EXAM: CT HEAD WITHOUT CONTRAST TECHNIQUE: Contiguous axial images were obtained from the base of the skull through the vertex without intravenous contrast. RADIATION DOSE REDUCTION: This exam was performed according to  the departmental dose-optimization program which includes automated exposure control, adjustment of the mA and/or kV according to patient size and/or use of iterative reconstruction technique. COMPARISON:  Head CT 02/22/2022. FINDINGS: Brain: Intermittent motion artifact. Cerebral volume appears stable from last year. No midline shift, mass effect, or evidence of intracranial mass lesion. No ventriculomegaly. No acute intracranial hemorrhage identified. Patchy bilateral cerebral white matter hypodensity appears grossly stable. No acute cortically based infarct is evident. Basilar cisterns appear normal. Vascular: No suspicious intracranial vascular hyperdensity. Calcified atherosclerosis at the skull base. Skull: Intermittent motion artifact. No acute osseous abnormality identified. Sinuses/Orbits: Visualized paranasal sinuses and mastoids are stable and well aerated. Other: Rightward gaze similar to the prior exam. No other acute orbit or scalp soft tissue finding. ASPECTS [facility] Stroke Program Early CT Score) Total score (0-10 with 10 being normal): 10 IMPRESSION: 1. Mildly motion degraded exam.  No acute cortically based infarct or acute intracranial hemorrhage identified. ASPECTS 10. 2. These results were communicated to Dr. Otelia Limes at 4:47 am on 03/07/2023 by text page via the [facility] messaging system. Electronically Signed   By: Odessa Fleming M.D.   On: 03/07/2023 04:48     LOS: 1 day   Lanae Boast, MD [facility]  03/08/2023, 11:06 AM

## 2023-03-08 NOTE — Plan of Care (Signed)
  Problem: Education: Goal: Knowledge of disease or condition will improve Outcome: Progressing Goal: Knowledge of secondary prevention will improve (MUST DOCUMENT ALL) Outcome: Progressing Goal: Knowledge of patient specific risk factors will improve Loraine Leriche N/A or DELETE if not current risk factor) Outcome: Progressing   Problem: Coping: Goal: Will verbalize positive feelings about self Outcome: Progressing Goal: Will identify appropriate support needs Outcome: Progressing   Problem: Health Behavior/Discharge Planning: Goal: Ability to manage health-related needs will improve Outcome: Progressing Goal: Goals will be collaboratively established with patient/family Outcome: Progressing   Problem: Self-Care: Goal: Ability to participate in self-care as condition permits will improve Outcome: Progressing Goal: Verbalization of feelings and concerns over difficulty with self-care will improve Outcome: Progressing Goal: Ability to communicate needs accurately will improve Outcome: Progressing   Problem: Nutrition: Goal: Risk of aspiration will decrease Outcome: Progressing Goal: Dietary intake will improve Outcome: Progressing   Problem: Education: Goal: Ability to describe self-care measures that may prevent or decrease complications (Diabetes Survival Skills Education) will improve Outcome: Progressing Goal: Individualized Educational Video(s) Outcome: Progressing   Problem: Coping: Goal: Ability to adjust to condition or change in health will improve Outcome: Progressing   Problem: Fluid Volume: Goal: Ability to maintain a balanced intake and output will improve Outcome: Progressing   Problem: Metabolic: Goal: Ability to maintain appropriate glucose levels will improve Outcome: Progressing   Problem: Tissue Perfusion: Goal: Adequacy of tissue perfusion will improve Outcome: Progressing

## 2023-03-08 NOTE — Evaluation (Signed)
Physical Therapy Evaluation/ Discharge Patient Details Name: Kristina [name] MRN: [id number] DOB: 19-Jun-1946 Today's Date: 03/08/2023  History of Present Illness  77 y.o. female admitted 03/07/2023 with weakness, N/V, difficulty word finding. MRI brain(-), EEG with encephalopathy. Pt with hyponatremia.   PMHx: HTN, HLD, diastolic CHF, DMII, hypothyroidism, ulcerative colitis, asthma, GERD.  Clinical Impression  Pt pleasant and able to state sequence of events leading to admission and deficits she was experiencing but expresses return to baseline with all functional activity. Pt answering cognitive and orientation questions correctly other than initially stating Trump is president before correcting to [facility]. Pt has friend network for support and DME at home, without further therapy needs at this time.       If plan is discharge home, recommend the following:     Can travel by private vehicle        Equipment Recommendations None recommended by PT  Recommendations for Other Services       Functional Status Assessment Patient has not had a recent decline in their functional status     Precautions / Restrictions Precautions Precautions: None Restrictions Weight Bearing Restrictions: No      Mobility  Bed Mobility Overal bed mobility: Modified Independent                  Transfers Overall transfer level: Modified independent                      Ambulation/Gait Ambulation/Gait assistance: Modified independent (Device/Increase time) Gait Distance (Feet): 400 Feet Assistive device: None Gait Pattern/deviations: WFL(Within Functional Limits)   Gait velocity interpretation: >2.62 ft/sec, indicative of community ambulatory      Stairs            Wheelchair Mobility     Tilt Bed    Modified Rankin (Stroke Patients Only)       Balance Overall balance assessment: No apparent balance deficits (not formally assessed)                                            Pertinent Vitals/Pain Pain Assessment Pain Assessment: No/denies pain    Home Living Family/patient expects to be discharged to:: Private residence Living Arrangements: Alone Available Help at Discharge: Family;Friend(s);Available PRN/intermittently Type of Home: House Home Access: Stairs to enter Entrance Stairs-Rails: None Entrance Stairs-Number of Steps: threshold   Home Layout: One level Home Equipment: Cane - single point;Toilet riser;BSC/3in1;Rolling Environmental consultant (2 wheels) Additional Comments: carpal tunnel surgery between 14 days ago    Prior Function Prior Level of Function : Independent/Modified Independent             Mobility Comments: uses cane in the morning when getting up, intermittently wears brace on L knee. ADLs Comments: Indep with ADL and IADL     Extremity/Trunk Assessment   Upper Extremity Assessment Upper Extremity Assessment: Defer to OT evaluation LUE Deficits / Details: Recent L hand carpal tunnel surgery. Decresaed dexterity and experiencing numbness LUE Sensation: decreased light touch LUE Coordination: decreased fine motor    Lower Extremity Assessment Lower Extremity Assessment: Overall WFL for tasks assessed    Cervical / Trunk Assessment Cervical / Trunk Assessment: Kyphotic  Communication   Communication Communication: No apparent difficulties  Cognition Arousal: Alert Behavior During Therapy: WFL for tasks assessed/performed Overall Cognitive Status: Within Functional Limits for tasks assessed  General Comments General comments (skin integrity, edema, etc.): VSS    Exercises     Assessment/Plan    PT Assessment Patient does not need any further PT services  PT Problem List         PT Treatment Interventions      PT Goals (Current goals can be found in the Care Plan section)  Acute Rehab PT Goals PT Goal Formulation: All assessment  and education complete, DC therapy    Frequency       Co-evaluation               AM-PAC PT "6 Clicks" Mobility  Outcome Measure Help needed turning from your back to your side while in a flat bed without using bedrails?: None Help needed moving from lying on your back to sitting on the side of a flat bed without using bedrails?: None Help needed moving to and from a bed to a chair (including a wheelchair)?: None Help needed standing up from a chair using your arms (e.g., wheelchair or bedside chair)?: None Help needed to walk in hospital room?: None Help needed climbing 3-5 steps with a railing? : None 6 Click Score: 24    End of Session   Activity Tolerance: Patient tolerated treatment well Patient left: in chair;with call bell/phone within reach Nurse Communication: Mobility status PT Visit Diagnosis: Other abnormalities of gait and mobility (R26.89)    Time: 4098-1191 PT Time Calculation (min) (ACUTE ONLY): 29 min   Charges:   PT Evaluation $PT Eval Low Complexity: 1 Low   PT General Charges $$ ACUTE PT VISIT: 1 Visit         Merryl Hacker, PT Acute Rehabilitation Services Office: [phone]   Enedina Finner  03/08/2023, 1:09 PM

## 2023-03-09 DIAGNOSIS — E871 Hypo-osmolality and hyponatremia: Secondary | ICD-10-CM | POA: Diagnosis not present

## 2023-03-09 LAB — GLUCOSE, CAPILLARY
Glucose-Capillary: 104 mg/dL — ABNORMAL HIGH (ref 70–99)
Glucose-Capillary: 122 mg/dL — ABNORMAL HIGH (ref 70–99)
Glucose-Capillary: 255 mg/dL — ABNORMAL HIGH (ref 70–99)
Glucose-Capillary: 166 mg/dL — ABNORMAL HIGH (ref 70–99)

## 2023-03-09 MED ORDER — ASPIRIN 81 MG PO TBEC: 81.0000 mg | DELAYED_RELEASE_TABLET | Freq: Every day | ORAL | 0 refills | Status: AC

## 2023-03-09 MED ORDER — MELATONIN 3 MG PO TABS
3.0000 mg | ORAL_TABLET | Freq: Every evening | ORAL | Status: DC | PRN
  Administered 2023-03-09: 3 mg via ORAL
  Filled 2023-03-09: qty 1

## 2023-03-09 MED ORDER — METFORMIN HCL ER 750 MG PO TB24
1500.0000 mg | ORAL_TABLET | Freq: Every day | ORAL | Status: DC
  Filled 2023-03-09: qty 2

## 2023-03-09 NOTE — Progress Notes (Signed)
[facility] night cross cover note:   I was notified by RN of the patient's 8 PM sodium level of 128, which, in the setting of corresponding glucose level of 243, is adjusted to approximately 130.4, which is up slightly from previous value of 130.     Newton Pigg, DO Hospitalist

## 2023-03-09 NOTE — Progress Notes (Signed)
[facility] night cross cover note:   I was notified by RN of the patient's request for a sleep aid. I subsequently placed order for prn melatonin for insomnia.     Justin Howerter, DO Hospitalist  

## 2023-03-09 NOTE — Care Management (Signed)
Spoke w patient over the phone. She would like referral for OP OT to [facility], referral placed.  She will order a tub bench on her own as it is not covered by insurance.

## 2023-03-09 NOTE — Plan of Care (Signed)
Problem: Education: Goal: Knowledge of disease or condition will improve Outcome: Adequate for Discharge Goal: Knowledge of secondary prevention will improve (MUST DOCUMENT ALL) Outcome: Adequate for Discharge Goal: Knowledge of patient specific risk factors will improve (Mark N/A or DELETE if not current risk factor) Outcome: Adequate for Discharge   Problem: Ischemic Stroke/TIA Tissue Perfusion: Goal: Complications of ischemic stroke/TIA will be minimized Outcome: Adequate for Discharge   Problem: Coping: Goal: Will verbalize positive feelings about self Outcome: Adequate for Discharge Goal: Will identify appropriate support needs Outcome: Adequate for Discharge   Problem: Health Behavior/Discharge Planning: Goal: Ability to manage health-related needs will improve Outcome: Adequate for Discharge Goal: Goals will be collaboratively established with patient/family Outcome: Adequate for Discharge   Problem: Self-Care: Goal: Ability to participate in self-care as condition permits will improve Outcome: Adequate for Discharge Goal: Verbalization of feelings and concerns over difficulty with self-care will improve Outcome: Adequate for Discharge Goal: Ability to communicate needs accurately will improve Outcome: Adequate for Discharge   Problem: Nutrition: Goal: Risk of aspiration will decrease Outcome: Adequate for Discharge Goal: Dietary intake will improve Outcome: Adequate for Discharge   Problem: Education: Goal: Ability to describe self-care measures that may prevent or decrease complications (Diabetes Survival Skills Education) will improve Outcome: Adequate for Discharge Goal: Individualized Educational Video(s) Outcome: Adequate for Discharge   Problem: Coping: Goal: Ability to adjust to condition or change in health will improve Outcome: Adequate for Discharge   Problem: Fluid Volume: Goal: Ability to maintain a balanced intake and output will  improve Outcome: Adequate for Discharge   Problem: Health Behavior/Discharge Planning: Goal: Ability to identify and utilize available resources and services will improve Outcome: Adequate for Discharge Goal: Ability to manage health-related needs will improve Outcome: Adequate for Discharge   Problem: Metabolic: Goal: Ability to maintain appropriate glucose levels will improve Outcome: Adequate for Discharge   Problem: Nutritional: Goal: Maintenance of adequate nutrition will improve Outcome: Adequate for Discharge Goal: Progress toward achieving an optimal weight will improve Outcome: Adequate for Discharge   Problem: Skin Integrity: Goal: Risk for impaired skin integrity will decrease Outcome: Adequate for Discharge   Problem: Tissue Perfusion: Goal: Adequacy of tissue perfusion will improve Outcome: Adequate for Discharge   Problem: Education: Goal: Knowledge of General Education information will improve Description: Including pain rating scale, medication(s)/side effects and non-pharmacologic comfort measures Outcome: Adequate for Discharge   Problem: Health Behavior/Discharge Planning: Goal: Ability to manage health-related needs will improve Outcome: Adequate for Discharge   Problem: Clinical Measurements: Goal: Ability to maintain clinical measurements within normal limits will improve Outcome: Adequate for Discharge Goal: Will remain free from infection Outcome: Adequate for Discharge Goal: Diagnostic test results will improve Outcome: Adequate for Discharge Goal: Respiratory complications will improve Outcome: Adequate for Discharge Goal: Cardiovascular complication will be avoided Outcome: Adequate for Discharge   Problem: Activity: Goal: Risk for activity intolerance will decrease Outcome: Adequate for Discharge   Problem: Nutrition: Goal: Adequate nutrition will be maintained Outcome: Adequate for Discharge   Problem: Coping: Goal: Level of  anxiety will decrease Outcome: Adequate for Discharge   Problem: Elimination: Goal: Will not experience complications related to bowel motility Outcome: Adequate for Discharge Goal: Will not experience complications related to urinary retention Outcome: Adequate for Discharge   Problem: Pain Managment: Goal: General experience of comfort will improve Outcome: Adequate for Discharge   Problem: Safety: Goal: Ability to remain free from injury will improve Outcome: Adequate for Discharge   Problem: Skin Integrity: Goal: Risk for   impaired skin integrity will decrease Outcome: Adequate for Discharge   

## 2023-03-09 NOTE — Discharge Summary (Signed)
Physician Discharge Summary  LAMIJA DRUCKER ZOX:[id number] DOB: May 18, 1946 DOA: 03/07/2023  PCP: Shon Hale, MD  Admit date: 03/07/2023 Discharge date: 03/09/2023 Recommendations for Outpatient Follow-up:  Follow up with PCP in 1 weeks-call for appointment Please obtain BMP/CBC in one week  Discharge Dispo: hOME Discharge Condition: Stable Code Status:   Code Status: Full Code Diet recommendation:  Diet Order             Diet Heart Room service appropriate? Yes; Fluid consistency: Thin  Diet effective now                    Brief/Interim Summary: 77 y.o.f w/ HTN HLD CHFpEF T2DM,hypothyroidism, ulcerative colitis, and GERD presented with complaints of weakness,emesis. EMS was called at home, after evaluation it was decided that she did not need to come to the hospital.  However later on that evening she reported having some left-sided chest discomfort, difficulty breathing, generalized weakness, and reported having trouble getting out what she wanted to say. In the ED see as a code stroke. CT scan>> did not note any acute infarct or intracranial bleed but was motion degraded. CTA of the head and neck>>did not reveal any large vessel occlusion. Patient was not thought to be a tPA candidate. Vitals>>afebrile with blood pressures elevated up to 158/121, and all other vital signs maintained. Labs>> significant for sodium 120, chloride 82, CO2 20, glucose 194, anion gap 18, alcohol level undetectable, INR 1.1, and high-sensitivity troponin 21->27.  Chest x-ray did not note any acute abnormality.  Urinalysis positive for glucose and ketones.  UDS negative.  Patient was given 1 L of normal saline IV fluids, Zofran, Compazine, and Benadryl. Patient was admitted for further workup> acute stroke rule out, managed for hyponatremia likely volume depletion sodium appropriately increased she does have chronic history of hyponatremia at this time sodium stable urine SHOWS urine sodium is low  indicating hypovolemia. Nausea vomiting resolved, at this time doing well with PT OT and no further PT OT needs   Discharge Diagnoses:  Principal Problem:   Hyponatremia Active Problems:   Aphasia   TIA (transient ischemic attack)   Weakness   Nausea and vomiting   Controlled type 2 diabetes mellitus without complication, without long-term current use of insulin (HCC)   Hypothyroidism   Overweight (BMI 25.0-29.9)  Weakness and aphasia Initially seen as a code stroke, MRI brain no acute finding likely in the setting of hyponatremia. Symptoms resolved, at this time asymptomatic.EEG moderate diffuse encephalopathy nonspecific etiology.  Echo with EF 55 to 60%, no RWMA normal RV systolic function small pericardial effusion mitral valve no normal aortic valve not well-visualized.  LDL 62 HbA1c 6.7 CT head and neck no LVO. appreciate neurology f input continue aspirin 81 and home statin.  No PT OT needs.    Hyponatremia: 120>119 on admit 8/8 04 am>sodium slowly trending up on ivf as below-about 5-6 point correction in > 24 hours Urine workup shows urine sodium at 11 Osm 203. With gentle IV hydration sodium has improved to baseline 132, at this time she is alert awake oriented and asymptomatic. Will advised to hold off on Lasix and follow-up with PCP with repeat BMP next week  On review see has had SIMILAR hyponatremia chronically-a year ago 02/23/22: Na as low as 117 and her discharge was 136-that time hyponatremia developed after admission preceded by nausea vomiting overnight in her house workup unremarkable urine electrolytes consistent with SIADH picture also suspected due to nausea plus  thiazide exposure and needed 3% saline and tolvaptan x 1> on discharge advised to discontinue HCTZ and continue low-dose furosemide and free water restriction Recent Labs  Lab 03/08/23 0322 03/08/23 0725 03/08/23 1419 03/08/23 1825 03/09/23 0451  NA 125* 126* 130* 128* 132*    Nausea and  vomiting: Currently resolved, 12 rating diet HLD: Continue statin LDL at 62  HTN: Holding Lasix.on discharge resume valsartan.  Type 2 diabetes mellitus: Controlled HbA1c of 6.7 metformin upon discharge   Hypothyroidism: TSH normal 3.2, resume home Synthroid 50 mcg  MCI: on donepezil followed by neurology Dr. [name] as outpatient  Overweight: Advise weight loss before discharge  Consults: Neurology. Subjective: Alert oriented resting comfortably, suppository for discharge home today has been ambulating  Discharge Exam: Vitals:   03/09/23 0423 03/09/23 0840  BP: 123/60 (!) 117/49  Pulse: 72 80  Resp: 17 18  Temp: 98.7 F (37.1 C) 98.2 F (36.8 C)  SpO2: 97% 100%   General: Pt is alert, awake, not in acute distress Cardiovascular: RRR, S1/S2 +, no rubs, no gallops Respiratory: CTA bilaterally, no wheezing, no rhonchi Abdominal: Soft, NT, ND, bowel sounds + Extremities: no edema, no cyanosis  Discharge Instructions  Discharge Instructions     Discharge instructions   Complete by: As directed    Please call call MD or return to ER for similar or worsening recurring problem that brought you to hospital or if any fever,nausea/vomiting,abdominal pain, uncontrolled pain, chest pain,  shortness of breath or any other alarming symptoms.  Please hold lasix for a week and discuss with PCP before resuming it. Advise BMP in  1 week.  Please follow-up your doctor as instructed in a week time and call the office for appointment.  Please avoid alcohol, smoking, or any other illicit substance and maintain healthy habits including taking your regular medications as prescribed.  You were cared for by a hospitalist during your hospital stay. If you have any questions about your discharge medications or the care you received while you were in the hospital after you are discharged, you can call the unit and ask to speak with the hospitalist on call if the hospitalist that took care  of you is not available.  Once you are discharged, your primary care physician will handle any further medical issues. Please note that NO REFILLS for any discharge medications will be authorized once you are discharged, as it is imperative that you return to your primary care physician (or establish a relationship with a primary care physician if you do not have one) for your aftercare needs so that they can reassess your need for medications and monitor your lab values   Increase activity slowly   Complete by: As directed       Allergies as of 03/09/2023       Reactions   Ace Inhibitors Cough   Cipro [ciprofloxacin Hcl] Other (See Comments)   Tendonitis and tendon rupture, achilles tendon issue - posterior right LE   Vibra-tab [doxycycline] Other (See Comments)   Unknown reaction   Shellfish Allergy Diarrhea, Nausea And Vomiting, Rash, Other (See Comments)   Purple spots on skin        Medication List     STOP taking these medications    furosemide 20 MG tablet Commonly known as: LASIX       TAKE these medications    acetaminophen 650 MG CR tablet Commonly known as: TYLENOL Take 650-1,300 mg by mouth 2 (two) times daily as needed (knee  pain).   aspirin EC 81 MG tablet Take 1 tablet (81 mg total) by mouth daily. Swallow whole. Start taking on: March 10, 2023   donepezil 5 MG tablet Commonly known as: ARICEPT TAKE 1 TABLET BY MOUTH EVERYDAY AT BEDTIME What changed: See the new instructions.   hyoscyamine 0.125 MG tablet Commonly known as: LEVSIN Take 0.0625 mg by mouth 2 (two) times daily as needed for cramping.   levothyroxine 50 MCG tablet Commonly known as: SYNTHROID Take 50 mcg by mouth every morning.   mesalamine 0.375 g 24 hr capsule Commonly known as: APRISO Take 1,125 mg by mouth daily.   metFORMIN 500 MG 24 hr tablet Commonly known as: GLUCOPHAGE-XR Take 3 tablets (1,500 mg total) by mouth daily. What changed: when to take this   simvastatin  20 MG tablet Commonly known as: ZOCOR Take 20 mg by mouth at bedtime.   valsartan 160 MG tablet Commonly known as: Diovan Take 1 tablet (160 mg total) by mouth daily.   VITAMIN B-12 PO Take 1 tablet by mouth daily.        Follow-up Information     Windell Norfolk, MD. Go on 06/10/2023.   Specialty: Neurology Contact information: [address] [place] [address] [phone]         [facility] Follow up.   Specialty: Rehabilitation Why: they will call you in about a week to schedule an OT session Contact information: [address] [place] [address] [phone]               Allergies  Allergen Reactions   Ace Inhibitors Cough   Cipro [Ciprofloxacin Hcl] Other (See Comments)    Tendonitis and tendon rupture, achilles tendon issue - posterior right LE   Vibra-Tab [Doxycycline] Other (See Comments)    Unknown reaction   Shellfish Allergy Diarrhea, Nausea And Vomiting, Rash and Other (See Comments)    Purple spots on skin    The results of significant diagnostics from this hospitalization (including imaging, microbiology, ancillary and laboratory) are listed below for reference.    Microbiology: No results found for this or any previous visit (from the past 240 hour(s)).  Procedures/Studies: EEG adult  Result Date: March 22, 2023 Charlsie Quest, MD     2023-03-22  5:53 PM Patient Name: YULY ROHLFING MRN: [id number] Epilepsy Attending: Charlsie Quest Referring Physician/Provider: Marvel Plan, MD Date: 22-Mar-2023 Duration: 26.58 mins Patient history: 77yo f with ams getting eeg to evaluate for seizure Level of alertness: Awake AEDs during EEG study: None Technical aspects: This EEG study was done with scalp electrodes positioned according to the 10-20 International system of electrode placement. Electrical activity was reviewed with band pass filter of 1-70Hz , sensitivity of 7  uV/mm, display speed of 2mm/sec with a 60Hz  notched filter applied as appropriate. EEG data were recorded continuously and digitally stored.  Video monitoring was available and reviewed as appropriate. Description: The posterior dominant rhythm consists of 6-7 Hz activity of moderate voltage (25-35 uV) seen predominantly in posterior head regions, symmetric and reactive to eye opening and eye closing. EEG showed continuous generalized polymorphic sharply contoured 3 to 6 Hz theta-delta slowing. Hyperventilation and photic stimulation were not performed.   ABNORMALITY - Continuous slow, generalized IMPRESSION: This study is suggestive of moderate diffuse encephalopathy, nonspecific etiology. No seizures or epileptiform discharges were seen throughout the recording. Charlsie Quest   ECHOCARDIOGRAM COMPLETE  Result Date: 03-22-23    ECHOCARDIOGRAM REPORT  Patient Name:   SHERNITA HATHWAY Date of Exam: 03/07/2023 Medical Rec #:  [id number]        Height:       64.0 in Accession #:    [id number]       Weight:       148.0 lb Date of Birth:  Apr 05, 1946         BSA:          1.721 m Patient Age:    77 years         BP:           122/82 mmHg Patient Gender: F                HR:           87 bpm. Exam Location:  Inpatient Procedure: 2D Echo, Color Doppler and Cardiac Doppler Indications:    Stroke i63.9  History:        Patient has prior history of Echocardiogram examinations, most                 recent 02/23/2022. CHF; Risk Factors:Hypertension, Diabetes and                 Dyslipidemia.  Sonographer:    Irving Burton Senior RDCS Referring Phys: Clydie Braun IMPRESSIONS  1. Left ventricular ejection fraction, by estimation, is 55 to 60%. The left ventricle has normal function. The left ventricle has no regional wall motion abnormalities. Indeterminate diastolic filling due to E-A fusion.  2. Right ventricular systolic function is normal. The right ventricular size is normal. Tricuspid regurgitation signal is inadequate for  assessing PA pressure.  3. A small pericardial effusion is present. The pericardial effusion is anterior to the right ventricle and surrounding the apex.  4. The mitral valve is normal in structure. No evidence of mitral valve regurgitation.  5. The aortic valve was not well visualized. There is mild calcification of the aortic valve. Aortic valve regurgitation is not visualized. Aortic valve sclerosis is present, with no evidence of aortic valve stenosis.  6. The inferior vena cava is normal in size with greater than 50% respiratory variability, suggesting right atrial pressure of 3 mmHg. Conclusion(s)/Recommendation(s): No intracardiac source of embolism detected on this transthoracic study. Consider a transesophageal echocardiogram to exclude cardiac source of embolism if clinically indicated. FINDINGS  Left Ventricle: Left ventricular ejection fraction, by estimation, is 55 to 60%. The left ventricle has normal function. The left ventricle has no regional wall motion abnormalities. The left ventricular internal cavity size was normal in size. There is  borderline asymmetric left ventricular hypertrophy of the basal-septal segment. Indeterminate diastolic filling due to E-A fusion. Right Ventricle: The right ventricular size is normal. No increase in right ventricular wall thickness. Right ventricular systolic function is normal. Tricuspid regurgitation signal is inadequate for assessing PA pressure. Left Atrium: Left atrial size was normal in size. Right Atrium: Right atrial size was normal in size. Pericardium: A small pericardial effusion is present. The pericardial effusion is anterior to the right ventricle and surrounding the apex. Presence of epicardial fat layer. Mitral Valve: The mitral valve is normal in structure. No evidence of mitral valve regurgitation. Tricuspid Valve: The tricuspid valve is normal in structure. Tricuspid valve regurgitation is not demonstrated. Aortic Valve: The aortic valve was  not well visualized. There is mild calcification of the aortic valve. Aortic valve regurgitation is not visualized. Aortic valve sclerosis is present, with no evidence of aortic valve stenosis. Pulmonic  Valve: The pulmonic valve was not well visualized. Pulmonic valve regurgitation is not visualized. Aorta: The aortic root is normal in size and structure and the ascending aorta was not well visualized. Venous: The inferior vena cava is normal in size with greater than 50% respiratory variability, suggesting right atrial pressure of 3 mmHg. IAS/Shunts: The atrial septum is grossly normal.  LEFT VENTRICLE PLAX 2D LVIDd:         3.70 cm   Diastology LVIDs:         2.60 cm   LV e' medial:    3.15 cm/s LV PW:         0.90 cm   LV E/e' medial:  16.3 LV IVS:        1.00 cm   LV e' lateral:   3.37 cm/s LVOT diam:     1.90 cm   LV E/e' lateral: 15.3 LV SV:         47 LV SV Index:   27 LVOT Area:     2.84 cm  RIGHT VENTRICLE RV S prime:     14.80 cm/s TAPSE (M-mode): 1.8 cm LEFT ATRIUM             Index        RIGHT ATRIUM           Index LA diam:        2.80 cm 1.63 cm/m   RA Area:     11.00 cm LA Vol (A2C):   32.9 ml 19.11 ml/m  RA Volume:   22.90 ml  13.30 ml/m LA Vol (A4C):   38.8 ml 22.54 ml/m LA Biplane Vol: 38.2 ml 22.19 ml/m  AORTIC VALVE LVOT Vmax:   87.90 cm/s LVOT Vmean:  66.700 cm/s LVOT VTI:    0.166 m  AORTA Ao Root diam: 2.80 cm MITRAL VALVE MV Area (PHT): 3.01 cm     SHUNTS MV Decel Time: 252 msec     Systemic VTI:  0.17 m MV E velocity: 51.40 cm/s   Systemic Diam: 1.90 cm MV A velocity: 105.00 cm/s MV E/A ratio:  0.49 Weston Brass MD Electronically signed by Weston Brass MD Signature Date/Time: 03/07/2023/12:25:24 PM    Final    MR BRAIN WO CONTRAST  Result Date: 03/07/2023 CLINICAL DATA:  Word-finding difficulty. EXAM: MRI HEAD WITHOUT CONTRAST TECHNIQUE: Multiplanar, multiecho pulse sequences of the brain and surrounding structures were obtained without intravenous contrast. COMPARISON:   Same-day CT/CTA head and neck FINDINGS: Brain: There is no acute intracranial hemorrhage, extra-axial fluid collection, or acute infarct Background parenchymal volume is within expected limits for age. The ventricles are stable in size. 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 The pituitary and suprasellar region are normal. There is no mass lesion. There is no mass effect or midline shift. Vascular: Normal flow voids. Skull and upper cervical spine: Normal marrow signal. Sinuses/Orbits: The paranasal sinuses are clear. Bilateral lens implants are in place. The globes and orbits are otherwise unremarkable. Other: The mastoid air cells and middle ear cavities are clear. IMPRESSION: No acute intracranial pathology. Electronically Signed   By: Lesia Hausen M.D.   On: 03/07/2023 10:02   DG Chest Port 1 View  Result Date: 03/07/2023 CLINICAL DATA:  77 year old female with history of shortness of breath. EXAM: PORTABLE CHEST 1 VIEW COMPARISON:  Chest x-ray 02/27/2022. FINDINGS: Lung volumes are normal. No consolidative airspace disease. No pleural effusions.  No pneumothorax. No evidence of pulmonary edema. Heart size is normal. Upper mediastinal contours are distorted by patient's rotation to the left. IMPRESSION: 1. No radiographic evidence of acute cardiopulmonary disease. Electronically Signed   By: Trudie Reed M.D.   On: 03/07/2023 06:02   CT ANGIO HEAD NECK W WO CM (CODE STROKE)  Result Date: 03/07/2023 CLINICAL DATA:  77 year old female code stroke presentation. Wernicke aphasia. EXAM: CT ANGIOGRAPHY HEAD AND NECK TECHNIQUE: Multidetector CT imaging of the head and neck was performed using the standard protocol during bolus administration of intravenous contrast. Multiplanar CT image reconstructions and MIPs were obtained to evaluate the vascular anatomy. Carotid stenosis measurements (when  applicable) are obtained utilizing NASCET criteria, using the distal internal carotid diameter as the denominator. RADIATION DOSE REDUCTION: This exam was performed according to the departmental dose-optimization program which includes automated exposure control, adjustment of the mA and/or kV according to patient size and/or use of iterative reconstruction technique. CONTRAST:  75mL OMNIPAQUE IOHEXOL 350 MG/ML SOLN COMPARISON:  Plain head CT 0443 hours today. FINDINGS: CTA NECK Skeleton: No acute osseous abnormality identified. Osteopenia. Mild for age cervical spine degeneration. Upper chest: Negative. Other neck: Negative. Aortic arch: 3 vessel arch.  Mild for age arch atherosclerosis. Right carotid system: Negative brachiocephalic artery and right CCA origin. Mildly tortuous right CCA. Mild soft and calcified plaque combined at the right ICA origin and bulb but no stenosis. Left carotid system: Mild tortuosity with no plaque or stenosis to the skull base. Vertebral arteries: Mild calcified right subclavian artery origin. Normal right vertebral artery origin. Right vertebral appears mildly dominant, mildly ectatic, and is widely patent to the skull base without stenosis. Minimal proximal left subclavian artery plaque. Normal left vertebral artery origin. Fairly codominant left vertebral artery is also mildly tortuous and widely patent to the skull base with no stenosis. CTA HEAD Posterior circulation: Bilateral vertebral artery V4 segment calcified plaque. Mild left and mild-to-moderate right V4 segment stenosis (series 8, image 141). Patent vertebrobasilar junction. Patent PICA origins. Patent basilar artery with mild ectasia and no stenosis. Patent SCA and PCA origins. Posterior communicating arteries are diminutive or absent. Bilateral PCA branches are within normal limits. Anterior circulation: Both ICA siphons are patent. Both siphons are mildly ectatic. There is mild to moderate calcified plaque  bilaterally, but no siphon stenosis. Patent carotid termini, MCA and ACA origins. Diminutive or absent anterior communicating artery. Bilateral ACA branches are normal aside from mild tortuosity. Right MCA M1 segment and bifurcation are patent without stenosis. Right MCA branches are within normal limits. Left MCA M1 segment and trifurcation are patent without stenosis. No left MCA branch occlusion is identified, left MCA branches appear within normal limits. Venous sinuses: Early contrast timing, not evaluated. Anatomic variants: None. Review of the MIP images confirms the above findings IMPRESSION: 1. Negative for large vessel occlusion. 2. Mild for age atherosclerosis, especially in the neck. Calcified ICA siphon plaque with no significant stenosis. Calcified plaque of both distal vertebral arteries with up to Moderate Right and mild left V4 vertebral stenosis. Otherwise negative intracranial circulation. Preliminary report of no LVO discussed by telephone with Dr. Caryl Pina on 03/07/2023 at 0508 hours. Electronically Signed   By: Odessa Fleming M.D.   On: 03/07/2023 05:18   CT HEAD CODE STROKE WO CONTRAST  Result Date: 03/07/2023 CLINICAL DATA:  Code stroke. 77 year old female. Vomiting and abnormal speech. EXAM: CT HEAD WITHOUT CONTRAST TECHNIQUE: Contiguous axial images were obtained from the base of the skull through the  vertex without intravenous contrast. RADIATION DOSE REDUCTION: This exam was performed according to the departmental dose-optimization program which includes automated exposure control, adjustment of the mA and/or kV according to patient size and/or use of iterative reconstruction technique. COMPARISON:  Head CT 02/22/2022. FINDINGS: Brain: Intermittent motion artifact. Cerebral volume appears stable from last year. No midline shift, mass effect, or evidence of intracranial mass lesion. No ventriculomegaly. No acute intracranial hemorrhage identified. Patchy bilateral cerebral white matter  hypodensity appears grossly stable. No acute cortically based infarct is evident. Basilar cisterns appear normal. Vascular: No suspicious intracranial vascular hyperdensity. Calcified atherosclerosis at the skull base. Skull: Intermittent motion artifact. No acute osseous abnormality identified. Sinuses/Orbits: Visualized paranasal sinuses and mastoids are stable and well aerated. Other: Rightward gaze similar to the prior exam. No other acute orbit or scalp soft tissue finding. ASPECTS [facility] Stroke Program Early CT Score) Total score (0-10 with 10 being normal): 10 IMPRESSION: 1. Mildly motion degraded exam. No acute cortically based infarct or acute intracranial hemorrhage identified. ASPECTS 10. 2. These results were communicated to Dr. Otelia Limes at 4:47 am on 03/07/2023 by text page via the [facility] messaging system. Electronically Signed   By: Odessa Fleming M.D.   On: 03/07/2023 04:48    Labs: BNP (last 3 results) No results for input(s): "BNP" in the last 8760 hours. Basic Metabolic Panel: Recent Labs  Lab 03/08/23 0322 03/08/23 0725 03/08/23 1419 03/08/23 1825 03/09/23 0451  NA 125* 126* 130* 128* 132*  K 3.5 3.8 4.1 4.3 4.0  CL 90* 95* 96* 96* 100  CO2 24 23 22  21* 23  GLUCOSE 138* 134* 88 243* 128*  BUN 18 15 14 16 14   CREATININE 0.95 0.78 0.82 1.05* 0.83  CALCIUM 9.0 8.7* 9.1 8.9 8.9   Liver Function Tests: No results for input(s): "AST", "ALT", "ALKPHOS", "BILITOT", "PROT", "ALBUMIN" in the last 168 hours. No results for input(s): "LIPASE", "AMYLASE" in the last 168 hours. No results for input(s): "AMMONIA" in the last 168 hours. CBC: Recent Labs  Lab 03/07/23 0428 03/07/23 0435 03/08/23 0725 03/09/23 0451  WBC 7.8  --  3.7* 5.7  NEUTROABS 6.9  --   --   --   HGB 13.8 14.6 11.5* 11.2*  HCT 37.7 43.0 32.5* 32.5*  MCV 87.5  --  90.5 93.1  PLT 261  --  204 215   Cardiac Enzymes: No results for input(s): "CKTOTAL", "CKMB", "CKMBINDEX", "TROPONINI" in the last 168  hours. BNP: Invalid input(s): "POCBNP" CBG: Recent Labs  Lab 03/08/23 1555 03/08/23 2146 03/09/23 0101 03/09/23 0429 03/09/23 0846  GLUCAP 79 144* 104* 122* 255*   D-Dimer No results for input(s): "DDIMER" in the last 72 hours. Hgb A1c Recent Labs    03/07/23 1146  HGBA1C 6.7*   Lipid Profile Recent Labs    03/07/23 1146  CHOL 128  HDL 53  LDLCALC 62  TRIG 64  CHOLHDL 2.4   Thyroid function studies Recent Labs    03/07/23 1146  TSH 3.298   Anemia work up No results for input(s): "VITAMINB12", "FOLATE", "FERRITIN", "TIBC", "IRON", "RETICCTPCT" in the last 72 hours. Urinalysis    Component Value Date/Time   COLORURINE STRAW (A) 03/07/2023 0601   APPEARANCEUR HAZY (A) 03/07/2023 0601   LABSPEC 1.017 03/07/2023 0601   PHURINE 7.0 03/07/2023 0601   GLUCOSEU 150 (A) 03/07/2023 0601   HGBUR NEGATIVE 03/07/2023 0601   BILIRUBINUR NEGATIVE 03/07/2023 0601   BILIRUBINUR negative 09/21/2020 0834   KETONESUR 20 (A) 03/07/2023 0601  PROTEINUR NEGATIVE 03/07/2023 0601   UROBILINOGEN 0.2 09/21/2020 0834   UROBILINOGEN 0.2 05/01/2015 1551   NITRITE NEGATIVE 03/07/2023 0601   LEUKOCYTESUR NEGATIVE 03/07/2023 0601   Sepsis Labs Recent Labs  Lab 03/07/23 0428 03/08/23 0725 03/09/23 0451  WBC 7.8 3.7* 5.7   Microbiology No results found for this or any previous visit (from the past 240 hour(s)).   Time coordinating discharge: 25 minutes  SIGNED: Lanae Boast, MD  [facility] 03/09/2023, 11:45 AM  If 7PM-7AM, please contact night-coverage [email or web address]

## 2023-03-15 DIAGNOSIS — I10 Essential (primary) hypertension: Secondary | ICD-10-CM | POA: Diagnosis not present

## 2023-03-15 DIAGNOSIS — E871 Hypo-osmolality and hyponatremia: Secondary | ICD-10-CM | POA: Diagnosis not present

## 2023-03-15 DIAGNOSIS — R413 Other amnesia: Secondary | ICD-10-CM | POA: Diagnosis not present

## 2023-03-21 ENCOUNTER — Encounter: Payer: Self-pay | Admitting: Occupational Therapy

## 2023-03-21 ENCOUNTER — Ambulatory Visit: Payer: Medicare PPO | Attending: Internal Medicine | Admitting: Occupational Therapy

## 2023-03-21 ENCOUNTER — Telehealth: Payer: Self-pay | Admitting: Neurology

## 2023-03-21 DIAGNOSIS — R278 Other lack of coordination: Secondary | ICD-10-CM | POA: Insufficient documentation

## 2023-03-21 DIAGNOSIS — R208 Other disturbances of skin sensation: Secondary | ICD-10-CM | POA: Insufficient documentation

## 2023-03-21 DIAGNOSIS — M6281 Muscle weakness (generalized): Secondary | ICD-10-CM | POA: Diagnosis not present

## 2023-03-21 DIAGNOSIS — R29898 Other symptoms and signs involving the musculoskeletal system: Secondary | ICD-10-CM | POA: Insufficient documentation

## 2023-03-21 NOTE — Telephone Encounter (Signed)
Pt came into office stating she is having a problem with the donepezil (ARICEPT) 5 MG tablet . States she is not able to get sleep at all since taking. Saw Dr. [name] Aug 16th- She said to stop taking the donepezil (ARICEPT) 5 MG tablet  and get in contact with Dr. [name] about issue. Pt would like to know if she could take melatonin to sleep better or a medication to replace the donepezil (ARICEPT) 5 MG tablet . Informed pt it would be next week before she would hear back from office since Dr. [name] is out.  Would like a call [phone]

## 2023-03-21 NOTE — Therapy (Signed)
OUTPATIENT OCCUPATIONAL THERAPY ORTHO EVALUATION  Patient Name: Kristina [name] MRN: [id number] DOB:[birth year], 77 y.o., female Today's Date: 03/21/2023  PCP: Shon Hale, MD  REFERRING PROVIDER: Lanae Boast, MD  END OF SESSION:  OT End of Session - 03/21/23 1106     Visit Number 1    Number of Visits 9    Date for OT Re-Evaluation 05/17/23    Authorization Type [employer] - requires auth    Progress Note Due on Visit 9    OT Start Time 1105    OT Stop Time 1144    OT Time Calculation (min) 39 min    Activity Tolerance Patient tolerated treatment well    Behavior During Therapy WFL for tasks assessed/performed             Past Medical History:  Diagnosis Date   Anxiety    Asthma    Diabetes (HCC)    Diabetes mellitus without complication (HCC)    Diverticulitis    GERD (gastroesophageal reflux disease)    HTN (hypertension)    Hypercholesteremia    Hyperlipidemia    Hypertension    Obesity    Rosacea    Thyroid disease    Ulcerative colitis (HCC)    Past Surgical History:  Procedure Laterality Date   ACHILLES TENDON REPAIR     APPENDECTOMY     BIOPSY  10/16/2018   Procedure: BIOPSY;  Surgeon: Kathi Der, MD;  Location: [facility];  Service: Gastroenterology;;   CESAREAN SECTION     CHOLECYSTECTOMY     FLEXIBLE SIGMOIDOSCOPY N/A 10/16/2018   Procedure: FLEXIBLE SIGMOIDOSCOPY;  Surgeon: Kathi Der, MD;  Location: [facility];  Service: Gastroenterology;  Laterality: N/A;   hammertoe     Patient Active Problem List   Diagnosis Date Noted   Aphasia 03/07/2023   TIA (transient ischemic attack) 03/07/2023   Weakness 03/07/2023   Nausea and vomiting 03/07/2023   Overweight (BMI 25.0-29.9) 03/07/2023   Headache 02/23/2022   Chronic diastolic CHF (congestive heart failure) (HCC) 02/23/2022   HTN (hypertension), malignant 02/22/2022   GERD (gastroesophageal reflux disease)    Chest pain    Hypercholesteremia 10/15/2018   GIB  (gastrointestinal bleeding) 10/15/2018   Colitis 10/15/2018   Hypokalemia    Thyroid disease    Acute pyelonephritis 03/22/2018   Pyelonephritis    UTI (urinary tract infection) 03/21/2018   IBS (irritable bowel syndrome) 03/21/2018   Hypertensive disorder 02/12/2018   Dyslipidemia 02/12/2018   Pain in right knee 01/09/2018   Morbid obesity (HCC) 08/08/2015   Hyponatremia 05/01/2015   Gastroenteritis, acute 05/01/2015   Hypothyroidism 05/01/2015   Asthma in remission 05/01/2015   Diarrhea    Cough variant asthma 04/19/2015   Acute bronchitis 03/08/2015   Hypothyroidism, adult 02/19/2015   Dyspnea 02/18/2015   Upper airway cough syndrome 01/07/2015   Pre-syncope 12/08/2013   PVC (premature ventricular contraction) 08/26/2013   Rosacea    Anxiety    Diverticulitis    Controlled type 2 diabetes mellitus without complication, without long-term current use of insulin (HCC)    Obesity     ONSET DATE: 03/09/2023 (date of referral)  REFERRING DIAG: R53.1 (ICD-10-CM) - Weakness  THERAPY DIAG:  Other lack of coordination  Muscle weakness (generalized)  Other disturbances of skin sensation  Other symptoms and signs involving the musculoskeletal system  Rationale for Evaluation and Treatment: Rehabilitation  SUBJECTIVE:   SUBJECTIVE STATEMENT: She cannot open certain jars/bottles even with adaptions (shelf liner).   Pt accompanied  by: self  PERTINENT HISTORY: R CTS in March 2024  and L CTS February 19, 2023 by Dr. Gildardo Griffes with [facility].  HTN, HLD, diastolic CHF, DMII, hypothyroidism, ulcerative colitis, asthma, GERD  PRECAUTIONS: Fall  WEIGHT BEARING RESTRICTIONS: No  PAIN:  Are you having pain? Yes: NPRS scale: 1/10 Pain location: L index finger Pain description: numb Aggravating factors: unknown Relieving factors: heated gloves  Has some chronic back pain.  FALLS: Has patient fallen in last 6 months? Yes. Number of falls 1  LIVING ENVIRONMENT: Lives with:  lives alone Lives in: House/apartment Stairs: Yes: External: 2 steps; none Has following equipment at home: Quad cane small base  PLOF: Independent; retired Engineer, site; driving; writing, reading, quilting  PATIENT GOALS: improve writing and general strength  NEXT MD VISIT: 04/05/2023  OBJECTIVE:   HAND DOMINANCE: Left  ADLs: Overall ADLs: mostly mod I as she lives alone; has been avoiding wearing a bra  FUNCTIONAL OUTCOME MEASURES: Quick Dash: 38.6 %   UPPER EXTREMITY ROM:     BUE: WNL  UPPER EXTREMITY MMT:     BUE: WFL though poor endurance  HAND FUNCTION: Grip strength: Right: 40.3 lbs; Left: 35.9 lbs  COORDINATION: 9 Hole Peg test: Right: 42 sec; Left: 45 sec  SENSATION: Paresthesias reported  EDEMA: none reported or observed  COGNITION: Overall cognitive status: Within functional limits for tasks assessed  OBSERVATIONS: Pt appears well-kept. Ambulates with kyphotic posturing without use of AD. No LOB. B incision scars well-healed though scar tissue adhesions appreciable.   TODAY'S TREATMENT:                                                                                                                               N/A this date   PATIENT EDUCATION: Education details: OT Role and POC Person educated: Patient Education method: Explanation Education comprehension: verbalized understanding  HOME EXERCISE PROGRAM: N/A This date  GOALS:  SHORT TERM GOALS: Target date: 04/18/2023    Patient will demonstrate independence with initial BUE HEP.  Baseline: not yet initiated Goal status: INITIAL  2.  Patient will demonstrate at least 39 lbs L grip strength as needed to open jars and other containers. Baseline: 35.9 lbs Goal status: INITIAL  3.  Pt will report no more than mild difficulty donning bra.  Baseline: moderate to severe with avoidance Goal status: INITIAL  4.  Pt will verbalize understanding of joint protection, ergonomics, and Landscape architect principles.  Baseline: not yet initiated Goal status: INITIAL  5.  Pt will independently recall 4/4 safety considerations for loss of sensation Baseline: not yet initiated Goal status: INITIAL   LONG TERM GOALS: Target date: 05/17/2023    Patient will demonstrate BUE HEP with 25% verbal cues or less for proper execution.  Baseline:  Goal status: INITIAL  2.  Patient will demonstrate at least 16% improvement with quick Dash score (reporting 22.6% disability or less) indicating improved functional use of affected  extremity. Baseline: 38.6% disability with use of BUE Goal status: INITIAL  3.  Patient will complete nine-hole peg with use of each hand in 30 seconds or less. Baseline: Right: 42 sec; Left: 45 sec Goal status: INITIAL   ASSESSMENT:  CLINICAL IMPRESSION: Patient is a 77 y.o. female who was seen today for occupational therapy evaluation for weakness. Hx includes HTN, HLD, diastolic CHF, DMII, hypothyroidism, ulcerative colitis, asthma, GERD. Patient currently presents below baseline level of functioning demonstrating functional deficits and impairments as noted below. Pt would benefit from skilled OT services in the outpatient setting to work on impairments as noted below to help pt return to PLOF as able.    PERFORMANCE DEFICITS: in functional skills including ADLs, IADLs, coordination, sensation, ROM, strength, pain, fascial restrictions, Fine motor control, endurance, decreased knowledge of use of DME, and UE functional use.  IMPAIRMENTS: are limiting patient from ADLs, IADLs, and leisure.   COMORBIDITIES: may have co-morbidities  that affects occupational performance. Patient will benefit from skilled OT to address above impairments and improve overall function.  MODIFICATION OR ASSISTANCE TO COMPLETE EVALUATION: No modification of tasks or assist necessary to complete an evaluation.  OT OCCUPATIONAL PROFILE AND HISTORY: Problem focused assessment: Including  review of records relating to presenting problem.  CLINICAL DECISION MAKING: LOW - limited treatment options, no task modification necessary  REHAB POTENTIAL: Good  EVALUATION COMPLEXITY: Low      PLAN:  OT FREQUENCY: 1x/week due to co-pay requests reduction from 2xweek  OT DURATION: 8 weeks  PLANNED INTERVENTIONS: self care/ADL training, therapeutic exercise, therapeutic activity, neuromuscular re-education, manual therapy, scar mobilization, passive range of motion, electrical stimulation, ultrasound, paraffin, fluidotherapy, moist heat, contrast bath, patient/family education, DME and/or AE instructions, and Re-evaluation  RECOMMENDED OTHER SERVICES: none at this time  CONSULTED AND AGREED WITH PLAN OF CARE: Patient  PLAN FOR NEXT SESSION: Issue tendon glides; scar management B; [place]   Delana Meyer, OT 03/21/2023, 1:50 PM

## 2023-03-26 ENCOUNTER — Encounter: Payer: Self-pay | Admitting: Occupational Therapy

## 2023-03-26 ENCOUNTER — Telehealth: Payer: Self-pay | Admitting: Neurology

## 2023-03-26 ENCOUNTER — Ambulatory Visit: Payer: Medicare PPO | Admitting: Occupational Therapy

## 2023-03-26 DIAGNOSIS — R29898 Other symptoms and signs involving the musculoskeletal system: Secondary | ICD-10-CM | POA: Diagnosis not present

## 2023-03-26 DIAGNOSIS — R278 Other lack of coordination: Secondary | ICD-10-CM | POA: Diagnosis not present

## 2023-03-26 DIAGNOSIS — M6281 Muscle weakness (generalized): Secondary | ICD-10-CM | POA: Diagnosis not present

## 2023-03-26 DIAGNOSIS — R208 Other disturbances of skin sensation: Secondary | ICD-10-CM

## 2023-03-26 NOTE — Patient Instructions (Signed)
Scar Massage Purpose: To soften/smooth scar tissue.   To desensitize sensitive areas after surgery.   To mechanically break up inner scar tissue, adhesions, therefore allowing freer        movement of injured tendons and muscle.  Technique: Use a cream to massage with, as it insures a smooth gliding motion and avoids irritation  caused by rubbing skin to skin.  Cream is preferred over a lotion.   May begin as soon as any suture areas are healed.   Apply a firm, steady pressure with your finger-tip, pulling the skin in a circular motion over the scarred area.  Do not rub.  Message 5 minutes, at least 2 times a day, unless you are getting tender afterwards

## 2023-03-26 NOTE — Telephone Encounter (Signed)
Pt is still not taking medication, states it has been at least 5 days since no medication. And trouble sleeping only occurred one night.  Pt would like a phone call to discuss states she does not use or get on mychart.

## 2023-03-26 NOTE — Therapy (Signed)
OUTPATIENT OCCUPATIONAL THERAPY ORTHO TREATMENT  Patient Name: Kristina [name] MRN: [id number] DOB:05-29-1946, 77 y.o., female Today's Date: 03/26/2023  PCP: Shon Hale, MD  REFERRING PROVIDER: Lanae Boast, MD  END OF SESSION:  OT End of Session - 03/26/23 1228     Visit Number 2    Number of Visits 9    Date for OT Re-Evaluation 05/17/23    Authorization Type [employer] - requires auth    Progress Note Due on Visit 9    OT Start Time 1228    OT Stop Time 1313    OT Time Calculation (min) 45 min    Activity Tolerance Patient tolerated treatment well    Behavior During Therapy WFL for tasks assessed/performed             Past Medical History:  Diagnosis Date   Anxiety    Asthma    Diabetes (HCC)    Diabetes mellitus without complication (HCC)    Diverticulitis    GERD (gastroesophageal reflux disease)    HTN (hypertension)    Hypercholesteremia    Hyperlipidemia    Hypertension    Obesity    Rosacea    Thyroid disease    Ulcerative colitis (HCC)    Past Surgical History:  Procedure Laterality Date   ACHILLES TENDON REPAIR     APPENDECTOMY     BIOPSY  10/16/2018   Procedure: BIOPSY;  Surgeon: Kathi Der, MD;  Location: [facility];  Service: Gastroenterology;;   CESAREAN SECTION     CHOLECYSTECTOMY     FLEXIBLE SIGMOIDOSCOPY N/A 10/16/2018   Procedure: FLEXIBLE SIGMOIDOSCOPY;  Surgeon: Kathi Der, MD;  Location: [facility];  Service: Gastroenterology;  Laterality: N/A;   hammertoe     Patient Active Problem List   Diagnosis Date Noted   Aphasia 03/07/2023   TIA (transient ischemic attack) 03/07/2023   Weakness 03/07/2023   Nausea and vomiting 03/07/2023   Overweight (BMI 25.0-29.9) 03/07/2023   Headache 02/23/2022   Chronic diastolic CHF (congestive heart failure) (HCC) 02/23/2022   HTN (hypertension), malignant 02/22/2022   GERD (gastroesophageal reflux disease)    Chest pain    Hypercholesteremia 10/15/2018   GIB  (gastrointestinal bleeding) 10/15/2018   Colitis 10/15/2018   Hypokalemia    Thyroid disease    Acute pyelonephritis 03/22/2018   Pyelonephritis    UTI (urinary tract infection) 03/21/2018   IBS (irritable bowel syndrome) 03/21/2018   Hypertensive disorder 02/12/2018   Dyslipidemia 02/12/2018   Pain in right knee 01/09/2018   Morbid obesity (HCC) 08/08/2015   Hyponatremia 05/01/2015   Gastroenteritis, acute 05/01/2015   Hypothyroidism 05/01/2015   Asthma in remission 05/01/2015   Diarrhea    Cough variant asthma 04/19/2015   Acute bronchitis 03/08/2015   Hypothyroidism, adult 02/19/2015   Dyspnea 02/18/2015   Upper airway cough syndrome 01/07/2015   Pre-syncope 12/08/2013   PVC (premature ventricular contraction) 08/26/2013   Rosacea    Anxiety    Diverticulitis    Controlled type 2 diabetes mellitus without complication, without long-term current use of insulin (HCC)    Obesity     ONSET DATE: 03/09/2023 (date of referral)  REFERRING DIAG: R53.1 (ICD-10-CM) - Weakness  THERAPY DIAG:  Muscle weakness (generalized)  Other lack of coordination  Other disturbances of skin sensation  Rationale for Evaluation and Treatment: Rehabilitation  SUBJECTIVE:   SUBJECTIVE STATEMENT: She cannot open certain jars/bottles even with adaptions (shelf liner). No new falls. Pt also reports difficulty picking up small things w/ numbness  at tip of index finger  Pt accompanied by: self  PERTINENT HISTORY: R CTS in March 2024  and L CTS February 19, 2023 by Dr. Gildardo Griffes with [facility].  HTN, HLD, diastolic CHF, DMII, hypothyroidism, ulcerative colitis, asthma, GERD  PRECAUTIONS: Fall  WEIGHT BEARING RESTRICTIONS: No  PAIN:  Are you having pain? Yes: NPRS scale: 1/10 Pain location: L index finger Pain description: numb Aggravating factors: unknown Relieving factors: heated gloves  Has some chronic back pain.  FALLS: Has patient fallen in last 6 months? Yes. Number of falls  1  LIVING ENVIRONMENT: Lives with: lives alone Lives in: House/apartment Stairs: Yes: External: 2 steps; none Has following equipment at home: Quad cane small base  PLOF: Independent; retired Engineer, site; driving; writing, reading, quilting  PATIENT GOALS: improve writing and general strength  NEXT MD VISIT: 04/05/2023  OBJECTIVE:   HAND DOMINANCE: Left  ADLs: Overall ADLs: mostly mod I as she lives alone; has been avoiding wearing a bra  FUNCTIONAL OUTCOME MEASURES: Quick Dash: 38.6 %   UPPER EXTREMITY ROM:     BUE: WNL  UPPER EXTREMITY MMT:     BUE: WFL though poor endurance  HAND FUNCTION: Grip strength: Right: 40.3 lbs; Left: 35.9 lbs  COORDINATION: 9 Hole Peg test: Right: 42 sec; Left: 45 sec  SENSATION: Paresthesias reported  EDEMA: none reported or observed  COGNITION: Overall cognitive status: Within functional limits for tasks assessed  OBSERVATIONS: Pt appears well-kept. Ambulates with kyphotic posturing without use of AD. No LOB. B incision scars well-healed though scar tissue adhesions appreciable.   TODAY'S TREATMENT:                                                                                                                               Ultrasound x 8 minutes, 3 Mhz, 0.8 wts/cm2, 20% over Lt carpal tunnel incision for scar tissue management.   Pt then instructed in scar massage (see pt instructions for details)   Pt issued tendon gliding ex's (not in EPIC) - pt required multiple review and will benefit from further review/reinforcement  Pt issued A/E handouts on various jar/bottle openers and medicine bottle opener    PATIENT EDUCATION: Education details: see above Person educated: Patient Education method: Explanation, Demonstration, Tactile cues, Verbal cues, and Handouts Education comprehension: verbalized understanding, returned demonstration, verbal cues required, tactile cues required, and needs further education  HOME  EXERCISE PROGRAM: 03/26/23: scar massage, tendon gliding ex's, A/E for jar/bottle openers  GOALS:  SHORT TERM GOALS: Target date: 04/18/2023    Patient will demonstrate independence with initial BUE HEP.  Baseline: not yet initiated Goal status: INITIAL  2.  Patient will demonstrate at least 39 lbs L grip strength as needed to open jars and other containers. Baseline: 35.9 lbs Goal status: INITIAL  3.  Pt will report no more than mild difficulty donning bra.  Baseline: moderate to severe with avoidance Goal status: INITIAL  4.  Pt will  verbalize understanding of joint protection, ergonomics, and Optometrist principles.  Baseline: not yet initiated Goal status: INITIAL  5.  Pt will independently recall 4/4 safety considerations for loss of sensation Baseline: not yet initiated Goal status: INITIAL   LONG TERM GOALS: Target date: 05/17/2023    Patient will demonstrate BUE HEP with 25% verbal cues or less for proper execution.  Baseline:  Goal status: INITIAL  2.  Patient will demonstrate at least 16% improvement with quick Dash score (reporting 22.6% disability or less) indicating improved functional use of affected extremity. Baseline: 38.6% disability with use of BUE Goal status: INITIAL  3.  Patient will complete nine-hole peg with use of each hand in 30 seconds or less. Baseline: Right: 42 sec; Left: 45 sec Goal status: INITIAL   ASSESSMENT:  CLINICAL IMPRESSION: Patient returns today for first treatment following evaluation for hand weakness following Rt CTR in March and Lt CTR in July. Began scar massage and [place] for scar management, and needs further review of tendon gliding ex's.  PERFORMANCE DEFICITS: in functional skills including ADLs, IADLs, coordination, sensation, ROM, strength, pain, fascial restrictions, Fine motor control, endurance, decreased knowledge of use of DME, and UE functional use.  IMPAIRMENTS: are limiting patient from ADLs, IADLs, and  leisure.   COMORBIDITIES: may have co-morbidities  that affects occupational performance. Patient will benefit from skilled OT to address above impairments and improve overall function.  MODIFICATION OR ASSISTANCE TO COMPLETE EVALUATION: No modification of tasks or assist necessary to complete an evaluation.  OT OCCUPATIONAL PROFILE AND HISTORY: Problem focused assessment: Including review of records relating to presenting problem.  CLINICAL DECISION MAKING: LOW - limited treatment options, no task modification necessary  REHAB POTENTIAL: Good  EVALUATION COMPLEXITY: Low      PLAN:  OT FREQUENCY: 1x/week due to co-pay requests reduction from 2xweek  OT DURATION: 8 weeks  PLANNED INTERVENTIONS: self care/ADL training, therapeutic exercise, therapeutic activity, neuromuscular re-education, manual therapy, scar mobilization, passive range of motion, electrical stimulation, ultrasound, paraffin, fluidotherapy, moist heat, contrast bath, patient/family education, DME and/or AE instructions, and Re-evaluation  RECOMMENDED OTHER SERVICES: none at this time  CONSULTED AND AGREED WITH PLAN OF CARE: Patient  PLAN FOR NEXT SESSION: review tendon gliding ex's, try fluido or paraffin, practice writing w/ adapted pen prn (try Pen Again), practice picking up small items Lt hand b/t first 3 fingers   Sheran Lawless, OT 03/26/2023, 12:29 PM

## 2023-03-28 ENCOUNTER — Other Ambulatory Visit: Payer: Self-pay | Admitting: Neurology

## 2023-03-28 MED ORDER — RIVASTIGMINE TARTRATE 1.5 MG PO CAPS: 1.5000 mg | ORAL_CAPSULE | Freq: Two times a day (BID) | ORAL | 6 refills | Status: AC

## 2023-03-28 NOTE — Telephone Encounter (Signed)
Called patient and she is reporting that she could not sleep on Aricept and it would take hours for her to fall asleep and then she would not be able go back to sleep when she woke up shortly after. Her PCP advised her to stop the medication and reach out to [place]. She stopped on the 17th and reports her sleeping pattern is back to normal. She would like to know what next steps should be or if there is another medication she can take or try.

## 2023-03-28 NOTE — Telephone Encounter (Signed)
Called and updated patient on plan and she was agreeable and grateful for the call.

## 2023-03-28 NOTE — Telephone Encounter (Signed)
Pleas stop Aricept since you are having side effects. Will try her on Exelon 1.5 mg twice daily. She should also stop the medication and call us if she is having side effect.

## 2023-03-28 NOTE — Telephone Encounter (Signed)
Pt having a side effect from taking donepezil (ARICEPT) 5 MG tablet. PCP suggested I stop taking the medication, stop taking the medication on 03/16/23. Since stop taking the medication I'm sleeping fine.  Would like a call from the nurse. Pt said can not get on MyChart, please call.

## 2023-04-03 ENCOUNTER — Ambulatory Visit: Payer: Medicare PPO | Attending: Internal Medicine | Admitting: Occupational Therapy

## 2023-04-03 DIAGNOSIS — M6281 Muscle weakness (generalized): Secondary | ICD-10-CM | POA: Insufficient documentation

## 2023-04-03 DIAGNOSIS — R29898 Other symptoms and signs involving the musculoskeletal system: Secondary | ICD-10-CM | POA: Insufficient documentation

## 2023-04-03 DIAGNOSIS — R278 Other lack of coordination: Secondary | ICD-10-CM | POA: Diagnosis not present

## 2023-04-03 DIAGNOSIS — R208 Other disturbances of skin sensation: Secondary | ICD-10-CM | POA: Insufficient documentation

## 2023-04-03 NOTE — Therapy (Signed)
OUTPATIENT OCCUPATIONAL THERAPY ORTHO TREATMENT  Patient Name: Kristina [name] MRN: [id number] DOB:06/30/1946, 77 y.o., female Today's Date: 04/03/2023  PCP: Shon Hale, MD  REFERRING PROVIDER: Lanae Boast, MD  END OF SESSION:  OT End of Session - 04/03/23 1529     Visit Number 3    Number of Visits 9    Date for OT Re-Evaluation 05/17/23    Authorization Type [employer] - auth approved    Authorization Time Period 03/21/2023 - 05/17/2023    Authorization - Number of Visits 8    Progress Note Due on Visit 9    OT Start Time 1531    OT Stop Time 1612    OT Time Calculation (min) 41 min    Activity Tolerance Patient tolerated treatment well    Behavior During Therapy WFL for tasks assessed/performed             Past Medical History:  Diagnosis Date   Anxiety    Asthma    Diabetes (HCC)    Diabetes mellitus without complication (HCC)    Diverticulitis    GERD (gastroesophageal reflux disease)    HTN (hypertension)    Hypercholesteremia    Hyperlipidemia    Hypertension    Obesity    Rosacea    Thyroid disease    Ulcerative colitis (HCC)    Past Surgical History:  Procedure Laterality Date   ACHILLES TENDON REPAIR     APPENDECTOMY     BIOPSY  10/16/2018   Procedure: BIOPSY;  Surgeon: Kathi Der, MD;  Location: [facility];  Service: Gastroenterology;;   CESAREAN SECTION     CHOLECYSTECTOMY     FLEXIBLE SIGMOIDOSCOPY N/A 10/16/2018   Procedure: FLEXIBLE SIGMOIDOSCOPY;  Surgeon: Kathi Der, MD;  Location: [facility];  Service: Gastroenterology;  Laterality: N/A;   hammertoe     Patient Active Problem List   Diagnosis Date Noted   Aphasia 03/07/2023   TIA (transient ischemic attack) 03/07/2023   Weakness 03/07/2023   Nausea and vomiting 03/07/2023   Overweight (BMI 25.0-29.9) 03/07/2023   Headache 02/23/2022   Chronic diastolic CHF (congestive heart failure) (HCC) 02/23/2022   HTN (hypertension), malignant 02/22/2022   GERD  (gastroesophageal reflux disease)    Chest pain    Hypercholesteremia 10/15/2018   GIB (gastrointestinal bleeding) 10/15/2018   Colitis 10/15/2018   Hypokalemia    Thyroid disease    Acute pyelonephritis 03/22/2018   Pyelonephritis    UTI (urinary tract infection) 03/21/2018   IBS (irritable bowel syndrome) 03/21/2018   Hypertensive disorder 02/12/2018   Dyslipidemia 02/12/2018   Pain in right knee 01/09/2018   Morbid obesity (HCC) 08/08/2015   Hyponatremia 05/01/2015   Gastroenteritis, acute 05/01/2015   Hypothyroidism 05/01/2015   Asthma in remission 05/01/2015   Diarrhea    Cough variant asthma 04/19/2015   Acute bronchitis 03/08/2015   Hypothyroidism, adult 02/19/2015   Dyspnea 02/18/2015   Upper airway cough syndrome 01/07/2015   Pre-syncope 12/08/2013   PVC (premature ventricular contraction) 08/26/2013   Rosacea    Anxiety    Diverticulitis    Controlled type 2 diabetes mellitus without complication, without long-term current use of insulin (HCC)    Obesity     ONSET DATE: 03/09/2023 (date of referral)  REFERRING DIAG: R53.1 (ICD-10-CM) - Weakness  THERAPY DIAG:  Muscle weakness (generalized)  Other lack of coordination  Other disturbances of skin sensation  Other symptoms and signs involving the musculoskeletal system  Rationale for Evaluation and Treatment: Rehabilitation  SUBJECTIVE:  SUBJECTIVE STATEMENT: She is unsure of what the numbers mean on the handout from last time.   Pt accompanied by: self  PERTINENT HISTORY: R CTS in March 2024  and L CTS February 19, 2023 by Dr. Gildardo Griffes with [facility].  HTN, HLD, diastolic CHF, DMII, hypothyroidism, ulcerative colitis, asthma, GERD  PRECAUTIONS: Fall  WEIGHT BEARING RESTRICTIONS: No  PAIN:  Are you having pain? No: worst pain in last 24 hours - 2/10  FALLS: Has patient fallen in last 6 months? Yes. Number of falls 1  LIVING ENVIRONMENT: Lives with: lives alone Lives in:  House/apartment Stairs: Yes: External: 2 steps; none Has following equipment at home: Quad cane small base  PLOF: Independent; retired Engineer, site; driving; writing, reading, quilting  PATIENT GOALS: improve writing and general strength  NEXT MD VISIT: 04/05/2023  OBJECTIVE:   HAND DOMINANCE: Left  ADLs: Overall ADLs: mostly mod I as she lives alone; has been avoiding wearing a bra  FUNCTIONAL OUTCOME MEASURES: Quick Dash: 38.6 %   UPPER EXTREMITY ROM:     BUE: WNL  UPPER EXTREMITY MMT:     BUE: WFL though poor endurance  HAND FUNCTION: Grip strength: Right: 40.3 lbs; Left: 35.9 lbs  COORDINATION: 9 Hole Peg test: Right: 42 sec; Left: 45 sec  SENSATION: Paresthesias reported  EDEMA: none reported or observed  COGNITION: Overall cognitive status: Within functional limits for tasks assessed  OBSERVATIONS: Pt appears well-kept. Ambulates with kyphotic posturing without use of AD. No LOB. B incision scars well-healed though scar tissue adhesions appreciable.   TODAY'S TREATMENT:                                                                                                                               - Therapeutic exercises completed for duration as noted below including:  Pt reviewed tendon gliding exercises as noted in pt instructions. Pt requiring mod cueing before improving to min cueing  Pt placed BUE in Fluidotherapy machine with supervised ROM x 10 min. Pt was educated to complete BUE ROM including tendon glides during modality time to improve ROM and decrease pain/stiffness of affected extremity by use of the machine's massaging action and thermal properties.   - Ultrasound completed for duration as noted below including:  Ultrasound applied to palmar bilateral hand and wrist for 8 minutes, frequency of 3 MHz, 20% duty cycle, and 1.1 W/cm with pt's arm placed on soft towel for promotion of ROM, scar tissue management, edema reduction, and pain reduction  in affected extremity.  PATIENT EDUCATION: Education details: see above Person educated: Patient Education method: Explanation, Demonstration, Tactile cues, Verbal cues, and Handouts Education comprehension: verbalized understanding, returned demonstration, verbal cues required, tactile cues required, and needs further education  HOME EXERCISE PROGRAM: 03/26/23: scar massage, tendon gliding ex's, A/E for jar/bottle openers 04/03/2023:  tendon glides (handout) GOALS:  SHORT TERM GOALS: Target date: 04/18/2023    Patient will demonstrate independence with initial BUE HEP.  Baseline: not yet initiated Goal status: INITIAL  2.  Patient will demonstrate at least 39 lbs L grip strength as needed to open jars and other containers. Baseline: 35.9 lbs Goal status: INITIAL  3.  Pt will report no more than mild difficulty donning bra.  Baseline: moderate to severe with avoidance Goal status: INITIAL  4.  Pt will verbalize understanding of joint protection, ergonomics, and Optometrist principles.  Baseline: not yet initiated Goal status: INITIAL  5.  Pt will independently recall 4/4 safety considerations for loss of sensation Baseline: not yet initiated Goal status: INITIAL   LONG TERM GOALS: Target date: 05/17/2023    Patient will demonstrate BUE HEP with 25% verbal cues or less for proper execution.  Baseline:  Goal status: INITIAL  2.  Patient will demonstrate at least 16% improvement with quick Dash score (reporting 22.6% disability or less) indicating improved functional use of affected extremity. Baseline: 38.6% disability with use of BUE Goal status: INITIAL  3.  Patient will complete nine-hole peg with use of each hand in 30 seconds or less. Baseline: Right: 42 sec; Left: 45 sec Goal status: INITIAL   ASSESSMENT:  CLINICAL IMPRESSION: Patient demonstrates good understanding of HEP as needed to progress towards goals. She would; however, benefit from additional HEP  review of tendon glides to meet STG #1.   PERFORMANCE DEFICITS: in functional skills including ADLs, IADLs, coordination, sensation, ROM, strength, pain, fascial restrictions, Fine motor control, endurance, decreased knowledge of use of DME, and UE functional use.  IMPAIRMENTS: are limiting patient from ADLs, IADLs, and leisure.   COMORBIDITIES: may have co-morbidities  that affects occupational performance. Patient will benefit from skilled OT to address above impairments and improve overall function.  REHAB POTENTIAL: Good  PLAN:  OT FREQUENCY: 1x/week due to co-pay requests reduction from 2xweek  OT DURATION: 8 weeks  PLANNED INTERVENTIONS: self care/ADL training, therapeutic exercise, therapeutic activity, neuromuscular re-education, manual therapy, scar mobilization, passive range of motion, electrical stimulation, ultrasound, paraffin, fluidotherapy, moist heat, contrast bath, patient/family education, DME and/or AE instructions, and Re-evaluation  RECOMMENDED OTHER SERVICES: none at this time  CONSULTED AND AGREED WITH PLAN OF CARE: Patient  PLAN FOR NEXT SESSION: review tendon gliding exs, continue fluido or try paraffin, practice writing w/ adapted pen prn (try Pen Again), practice picking up small items Lt hand b/t first 3 fingers   Delana Meyer, OT 04/03/2023, 3:31 PM

## 2023-04-10 ENCOUNTER — Ambulatory Visit: Payer: Medicare PPO | Admitting: Occupational Therapy

## 2023-04-10 ENCOUNTER — Encounter: Payer: Self-pay | Admitting: Occupational Therapy

## 2023-04-10 DIAGNOSIS — M6281 Muscle weakness (generalized): Secondary | ICD-10-CM | POA: Diagnosis not present

## 2023-04-10 DIAGNOSIS — R278 Other lack of coordination: Secondary | ICD-10-CM | POA: Diagnosis not present

## 2023-04-10 DIAGNOSIS — R29898 Other symptoms and signs involving the musculoskeletal system: Secondary | ICD-10-CM | POA: Diagnosis not present

## 2023-04-10 DIAGNOSIS — R208 Other disturbances of skin sensation: Secondary | ICD-10-CM | POA: Diagnosis not present

## 2023-04-10 NOTE — Therapy (Signed)
OUTPATIENT OCCUPATIONAL THERAPY ORTHO TREATMENT  Patient Name: Kristina [name] MRN: [id number] DOB:1946-01-30, 77 y.o., female Today's Date: 04/10/2023  PCP: Shon Hale, MD  REFERRING PROVIDER: Lanae Boast, MD  END OF SESSION:  OT End of Session - 04/10/23 1237     Visit Number 4    Number of Visits 9    Date for OT Re-Evaluation 05/17/23    Authorization Type [employer] - auth approved    Authorization Time Period 03/21/2023 - 05/17/2023    Authorization - Number of Visits 8    Progress Note Due on Visit 9    OT Start Time 1230    OT Stop Time 1315    OT Time Calculation (min) 45 min    Activity Tolerance Patient tolerated treatment well    Behavior During Therapy WFL for tasks assessed/performed             Past Medical History:  Diagnosis Date   Anxiety    Asthma    Diabetes (HCC)    Diabetes mellitus without complication (HCC)    Diverticulitis    GERD (gastroesophageal reflux disease)    HTN (hypertension)    Hypercholesteremia    Hyperlipidemia    Hypertension    Obesity    Rosacea    Thyroid disease    Ulcerative colitis (HCC)    Past Surgical History:  Procedure Laterality Date   ACHILLES TENDON REPAIR     APPENDECTOMY     BIOPSY  10/16/2018   Procedure: BIOPSY;  Surgeon: Kathi Der, MD;  Location: [facility];  Service: Gastroenterology;;   CESAREAN SECTION     CHOLECYSTECTOMY     FLEXIBLE SIGMOIDOSCOPY N/A 10/16/2018   Procedure: FLEXIBLE SIGMOIDOSCOPY;  Surgeon: Kathi Der, MD;  Location: [facility];  Service: Gastroenterology;  Laterality: N/A;   hammertoe     Patient Active Problem List   Diagnosis Date Noted   Aphasia 03/07/2023   TIA (transient ischemic attack) 03/07/2023   Weakness 03/07/2023   Nausea and vomiting 03/07/2023   Overweight (BMI 25.0-29.9) 03/07/2023   Headache 02/23/2022   Chronic diastolic CHF (congestive heart failure) (HCC) 02/23/2022   HTN (hypertension), malignant 02/22/2022   GERD  (gastroesophageal reflux disease)    Chest pain    Hypercholesteremia 10/15/2018   GIB (gastrointestinal bleeding) 10/15/2018   Colitis 10/15/2018   Hypokalemia    Thyroid disease    Acute pyelonephritis 03/22/2018   Pyelonephritis    UTI (urinary tract infection) 03/21/2018   IBS (irritable bowel syndrome) 03/21/2018   Hypertensive disorder 02/12/2018   Dyslipidemia 02/12/2018   Pain in right knee 01/09/2018   Morbid obesity (HCC) 08/08/2015   Hyponatremia 05/01/2015   Gastroenteritis, acute 05/01/2015   Hypothyroidism 05/01/2015   Asthma in remission 05/01/2015   Diarrhea    Cough variant asthma 04/19/2015   Acute bronchitis 03/08/2015   Hypothyroidism, adult 02/19/2015   Dyspnea 02/18/2015   Upper airway cough syndrome 01/07/2015   Pre-syncope 12/08/2013   PVC (premature ventricular contraction) 08/26/2013   Rosacea    Anxiety    Diverticulitis    Controlled type 2 diabetes mellitus without complication, without long-term current use of insulin (HCC)    Obesity     ONSET DATE: 03/09/2023 (date of referral)  REFERRING DIAG: R53.1 (ICD-10-CM) - Weakness  THERAPY DIAG:  Other lack of coordination  Other disturbances of skin sensation  Muscle weakness (generalized)  Rationale for Evaluation and Treatment: Rehabilitation  SUBJECTIVE:   SUBJECTIVE STATEMENT: No new falls and no  pain right now  Pt accompanied by: self  PERTINENT HISTORY: R CTS in March 2024  and L CTS February 19, 2023 by Dr. Melvyn Novas with [facility].  HTN, HLD, diastolic CHF, DMII, hypothyroidism, ulcerative colitis, asthma, GERD  PRECAUTIONS: Fall  WEIGHT BEARING RESTRICTIONS: No  PAIN:  Are you having pain? No: worst pain in last 24 hours - 2/10  FALLS: Has patient fallen in last 6 months? Yes. Number of falls 1  LIVING ENVIRONMENT: Lives with: lives alone Lives in: House/apartment Stairs: Yes: External: 2 steps; none Has following equipment at home: Quad cane small base  PLOF:  Independent; retired Engineer, site; driving; writing, reading, quilting  PATIENT GOALS: improve writing and general strength  NEXT MD VISIT: 04/05/2023  OBJECTIVE:   HAND DOMINANCE: Left  ADLs: Overall ADLs: mostly mod I as she lives alone; has been avoiding wearing a bra  FUNCTIONAL OUTCOME MEASURES: Quick Dash: 38.6 %   UPPER EXTREMITY ROM:     BUE: WNL  UPPER EXTREMITY MMT:     BUE: WFL though poor endurance  HAND FUNCTION: Grip strength: Right: 40.3 lbs; Left: 35.9 lbs  COORDINATION: 9 Hole Peg test: Right: 42 sec; Left: 45 sec  SENSATION: Paresthesias reported  EDEMA: none reported or observed  COGNITION: Overall cognitive status: Within functional limits for tasks assessed  OBSERVATIONS: Pt appears well-kept. Ambulates with kyphotic posturing without use of AD. No LOB. B incision scars well-healed though scar tissue adhesions appreciable.   TODAY'S TREATMENT:                                                                                                                               Paraffin x 10 min Lt hand to decrease stiffness and prevent pain.  (Pt with difficulty following directions for paraffin and needed max cueing)  Discussed movement following heat (paraffin, fluido, or heated gloves) for max benefit. Also discussed modifications for lack of sensation in fingertips including looking at hand more when using it.   Practiced writing with various adapted pens including Pen Again- pt did best w/ slightly larger pen, but easy gliding - recommended gel pens  Pt picking up small pegs Lt hand for coordination, especially to encourage work b/t first 3 fingers while following simple peg design. Pt cued not to use Rt hand to turn peg around in fingers but rather make Lt hand manipulate peg for placement in pegboard Practiced turning dii over b/t first 3 fingers in numerical order w/ drops into palm but improved with repetition.     PATIENT  EDUCATION: Education details: see above Person educated: Patient Education method: Explanation, Demonstration, Tactile cues, Verbal cues, and Handouts Education comprehension: verbalized understanding, returned demonstration, verbal cues required, tactile cues required, and needs further education  HOME EXERCISE PROGRAM: 03/26/23: scar massage, tendon gliding ex's, A/E for jar/bottle openers 04/03/2023:  tendon glides (handout) GOALS:  SHORT TERM GOALS: Target date: 04/18/2023    Patient will demonstrate independence  with initial BUE HEP.  Baseline: not yet initiated Goal status: INITIAL  2.  Patient will demonstrate at least 39 lbs L grip strength as needed to open jars and other containers. Baseline: 35.9 lbs Goal status: INITIAL  3.  Pt will report no more than mild difficulty donning bra.  Baseline: moderate to severe with avoidance Goal status: INITIAL  4.  Pt will verbalize understanding of joint protection, ergonomics, and Optometrist principles.  Baseline: not yet initiated Goal status: INITIAL  5.  Pt will independently recall 4/4 safety considerations for loss of sensation Baseline: not yet initiated Goal status: INITIAL   LONG TERM GOALS: Target date: 05/17/2023    Patient will demonstrate BUE HEP with 25% verbal cues or less for proper execution.  Baseline:  Goal status: INITIAL  2.  Patient will demonstrate at least 16% improvement with quick Dash score (reporting 22.6% disability or less) indicating improved functional use of affected extremity. Baseline: 38.6% disability with use of BUE Goal status: INITIAL  3.  Patient will complete nine-hole peg with use of each hand in 30 seconds or less. Baseline: Right: 42 sec; Left: 45 sec Goal status: INITIAL   ASSESSMENT:  CLINICAL IMPRESSION: Patient demonstrates good understanding of HEP as needed to progress towards goals. She would; however, benefit from additional HEP review of tendon glides to meet STG  #1.   PERFORMANCE DEFICITS: in functional skills including ADLs, IADLs, coordination, sensation, ROM, strength, pain, fascial restrictions, Fine motor control, endurance, decreased knowledge of use of DME, and UE functional use.  IMPAIRMENTS: are limiting patient from ADLs, IADLs, and leisure.   COMORBIDITIES: may have co-morbidities  that affects occupational performance. Patient will benefit from skilled OT to address above impairments and improve overall function.  REHAB POTENTIAL: Good  PLAN:  OT FREQUENCY: 1x/week due to co-pay requests reduction from 2xweek  OT DURATION: 8 weeks  PLANNED INTERVENTIONS: self care/ADL training, therapeutic exercise, therapeutic activity, neuromuscular re-education, manual therapy, scar mobilization, passive range of motion, electrical stimulation, ultrasound, paraffin, fluidotherapy, moist heat, contrast bath, patient/family education, DME and/or AE instructions, and Re-evaluation  RECOMMENDED OTHER SERVICES: none at this time  CONSULTED AND AGREED WITH PLAN OF CARE: Patient  PLAN FOR NEXT SESSION: review tendon gliding exs, assess STG's, consider using tweezers to place pegs in pegboard for sensory feedback/graded resistance  Sheran Lawless, OT 04/10/2023, 12:38 PM

## 2023-04-14 ENCOUNTER — Encounter (HOSPITAL_COMMUNITY): Payer: Self-pay | Admitting: Internal Medicine

## 2023-04-14 ENCOUNTER — Emergency Department (HOSPITAL_COMMUNITY): Payer: Medicare PPO

## 2023-04-14 ENCOUNTER — Inpatient Hospital Stay (HOSPITAL_COMMUNITY)
Admission: EM | Admit: 2023-04-14 | Discharge: 2023-04-18 | DRG: 690 | Disposition: A | Discharge status: Skilled Nursing Facility | Payer: Medicare PPO | Attending: Internal Medicine | Admitting: Internal Medicine

## 2023-04-14 ENCOUNTER — Other Ambulatory Visit: Payer: Self-pay

## 2023-04-14 DIAGNOSIS — R4701 Aphasia: Secondary | ICD-10-CM | POA: Diagnosis present

## 2023-04-14 DIAGNOSIS — E119 Type 2 diabetes mellitus without complications: Secondary | ICD-10-CM | POA: Diagnosis present

## 2023-04-14 DIAGNOSIS — Z888 Allergy status to other drugs, medicaments and biological substances status: Secondary | ICD-10-CM

## 2023-04-14 DIAGNOSIS — E039 Hypothyroidism, unspecified: Secondary | ICD-10-CM | POA: Diagnosis present

## 2023-04-14 DIAGNOSIS — R4182 Altered mental status, unspecified: Secondary | ICD-10-CM | POA: Diagnosis not present

## 2023-04-14 DIAGNOSIS — E871 Hypo-osmolality and hyponatremia: Secondary | ICD-10-CM | POA: Diagnosis present

## 2023-04-14 DIAGNOSIS — K219 Gastro-esophageal reflux disease without esophagitis: Secondary | ICD-10-CM | POA: Diagnosis present

## 2023-04-14 DIAGNOSIS — N3000 Acute cystitis without hematuria: Secondary | ICD-10-CM | POA: Diagnosis not present

## 2023-04-14 DIAGNOSIS — N390 Urinary tract infection, site not specified: Principal | ICD-10-CM | POA: Diagnosis present

## 2023-04-14 DIAGNOSIS — I110 Hypertensive heart disease with heart failure: Secondary | ICD-10-CM | POA: Diagnosis present

## 2023-04-14 DIAGNOSIS — R479 Unspecified speech disturbances: Principal | ICD-10-CM

## 2023-04-14 DIAGNOSIS — Z8249 Family history of ischemic heart disease and other diseases of the circulatory system: Secondary | ICD-10-CM

## 2023-04-14 DIAGNOSIS — E7800 Pure hypercholesterolemia, unspecified: Secondary | ICD-10-CM | POA: Diagnosis present

## 2023-04-14 DIAGNOSIS — R471 Dysarthria and anarthria: Secondary | ICD-10-CM | POA: Diagnosis present

## 2023-04-14 DIAGNOSIS — I10 Essential (primary) hypertension: Secondary | ICD-10-CM | POA: Diagnosis present

## 2023-04-14 DIAGNOSIS — I5032 Chronic diastolic (congestive) heart failure: Secondary | ICD-10-CM | POA: Diagnosis present

## 2023-04-14 DIAGNOSIS — R2689 Other abnormalities of gait and mobility: Secondary | ICD-10-CM | POA: Diagnosis not present

## 2023-04-14 DIAGNOSIS — Z79890 Hormone replacement therapy (postmenopausal): Secondary | ICD-10-CM

## 2023-04-14 DIAGNOSIS — Z6825 Body mass index (BMI) 25.0-25.9, adult: Secondary | ICD-10-CM | POA: Diagnosis not present

## 2023-04-14 DIAGNOSIS — I161 Hypertensive emergency: Secondary | ICD-10-CM | POA: Diagnosis present

## 2023-04-14 DIAGNOSIS — F419 Anxiety disorder, unspecified: Secondary | ICD-10-CM | POA: Diagnosis present

## 2023-04-14 DIAGNOSIS — G309 Alzheimer's disease, unspecified: Secondary | ICD-10-CM | POA: Diagnosis present

## 2023-04-14 DIAGNOSIS — Z79899 Other long term (current) drug therapy: Secondary | ICD-10-CM

## 2023-04-14 DIAGNOSIS — Z882 Allergy status to sulfonamides status: Secondary | ICD-10-CM | POA: Diagnosis not present

## 2023-04-14 DIAGNOSIS — Z881 Allergy status to other antibiotic agents status: Secondary | ICD-10-CM

## 2023-04-14 DIAGNOSIS — R278 Other lack of coordination: Secondary | ICD-10-CM | POA: Diagnosis not present

## 2023-04-14 DIAGNOSIS — F0280 Dementia in other diseases classified elsewhere without behavioral disturbance: Secondary | ICD-10-CM | POA: Diagnosis present

## 2023-04-14 DIAGNOSIS — J45909 Unspecified asthma, uncomplicated: Secondary | ICD-10-CM | POA: Diagnosis present

## 2023-04-14 DIAGNOSIS — Z91013 Allergy to seafood: Secondary | ICD-10-CM

## 2023-04-14 DIAGNOSIS — I160 Hypertensive urgency: Secondary | ICD-10-CM

## 2023-04-14 DIAGNOSIS — Z7984 Long term (current) use of oral hypoglycemic drugs: Secondary | ICD-10-CM

## 2023-04-14 DIAGNOSIS — E785 Hyperlipidemia, unspecified: Secondary | ICD-10-CM | POA: Diagnosis present

## 2023-04-14 DIAGNOSIS — E663 Overweight: Secondary | ICD-10-CM | POA: Diagnosis present

## 2023-04-14 DIAGNOSIS — R569 Unspecified convulsions: Secondary | ICD-10-CM | POA: Diagnosis not present

## 2023-04-14 DIAGNOSIS — R419 Unspecified symptoms and signs involving cognitive functions and awareness: Secondary | ICD-10-CM | POA: Diagnosis not present

## 2023-04-14 DIAGNOSIS — K5190 Ulcerative colitis, unspecified, without complications: Secondary | ICD-10-CM | POA: Diagnosis present

## 2023-04-14 DIAGNOSIS — M6281 Muscle weakness (generalized): Secondary | ICD-10-CM | POA: Diagnosis not present

## 2023-04-14 DIAGNOSIS — G3184 Mild cognitive impairment, so stated: Secondary | ICD-10-CM | POA: Diagnosis not present

## 2023-04-14 LAB — DIFFERENTIAL
Abs Immature Granulocytes: 0.01 10*3/uL (ref 0.00–0.07)
Basophils Absolute: 0 10*3/uL (ref 0.0–0.1)
Basophils Relative: 1 %
Eosinophils Absolute: 0.1 10*3/uL (ref 0.0–0.5)
Eosinophils Relative: 1 %
Immature Granulocytes: 0 %
Lymphocytes Relative: 11 %
Lymphs Abs: 0.8 10*3/uL (ref 0.7–4.0)
Monocytes Absolute: 0.3 10*3/uL (ref 0.1–1.0)
Monocytes Relative: 5 %
Neutro Abs: 5.8 10*3/uL (ref 1.7–7.7)
Neutrophils Relative %: 82 %

## 2023-04-14 LAB — COMPREHENSIVE METABOLIC PANEL
ALT: 11 U/L (ref 0–44)
AST: 15 U/L (ref 15–41)
Albumin: 4.2 g/dL (ref 3.5–5.0)
Alkaline Phosphatase: 65 U/L (ref 38–126)
Anion gap: 8 (ref 5–15)
BUN: 10 mg/dL (ref 8–23)
CO2: 26 mmol/L (ref 22–32)
Calcium: 9.5 mg/dL (ref 8.9–10.3)
Chloride: 102 mmol/L (ref 98–111)
Creatinine, Ser: 0.7 mg/dL (ref 0.44–1.00)
GFR, Estimated: >60 mL/min (ref >60)
Glucose, Bld: 160 mg/dL — ABNORMAL HIGH (ref 70–99)
Potassium: 4 mmol/L (ref 3.5–5.1)
Sodium: 136 mmol/L (ref 135–145)
Total Bilirubin: 0.8 mg/dL (ref 0.3–1.2)
Total Protein: 7.2 g/dL (ref 6.5–8.1)

## 2023-04-14 LAB — RAPID URINE DRUG SCREEN, HOSP PERFORMED
Amphetamines: NOT DETECTED
Barbiturates: NOT DETECTED
Benzodiazepines: NOT DETECTED
Cocaine: NOT DETECTED
Opiates: NOT DETECTED
Tetrahydrocannabinol: NOT DETECTED

## 2023-04-14 LAB — I-STAT CHEM 8, ED
BUN: 9 mg/dL (ref 8–23)
Calcium, Ion: 1.22 mmol/L (ref 1.15–1.40)
Chloride: 101 mmol/L (ref 98–111)
Creatinine, Ser: 0.7 mg/dL (ref 0.44–1.00)
Glucose, Bld: 157 mg/dL — ABNORMAL HIGH (ref 70–99)
HCT: 41 % (ref 36.0–46.0)
Hemoglobin: 13.9 g/dL (ref 12.0–15.0)
Potassium: 4.3 mmol/L (ref 3.5–5.1)
Sodium: 139 mmol/L (ref 135–145)
TCO2: 25 mmol/L (ref 22–32)

## 2023-04-14 LAB — CBC
HCT: 40.7 % (ref 36.0–46.0)
Hemoglobin: 13.4 g/dL (ref 12.0–15.0)
MCH: 32.1 pg (ref 26.0–34.0)
MCHC: 32.9 g/dL (ref 30.0–36.0)
MCV: 97.4 fL (ref 80.0–100.0)
Platelets: 232 10*3/uL (ref 150–400)
RBC: 4.18 MIL/uL (ref 3.87–5.11)
RDW: 13.2 % (ref 11.5–15.5)
WBC: 7 10*3/uL (ref 4.0–10.5)
nRBC: 0 % (ref 0.0–0.2)

## 2023-04-14 LAB — URINALYSIS, ROUTINE W REFLEX MICROSCOPIC
Bilirubin Urine: NEGATIVE
Glucose, UA: NEGATIVE mg/dL
Ketones, ur: NEGATIVE mg/dL
Nitrite: NEGATIVE
Protein, ur: NEGATIVE mg/dL
Specific Gravity, Urine: 1.003 — ABNORMAL LOW (ref 1.005–1.030)
pH: 7 (ref 5.0–8.0)

## 2023-04-14 LAB — GLUCOSE, CAPILLARY
Glucose-Capillary: 143 mg/dL — ABNORMAL HIGH (ref 70–99)
Glucose-Capillary: 152 mg/dL — ABNORMAL HIGH (ref 70–99)

## 2023-04-14 LAB — PROTIME-INR
INR: 1.1 (ref 0.8–1.2)
Prothrombin Time: 14.3 s (ref 11.4–15.2)

## 2023-04-14 LAB — APTT: aPTT: 27 s (ref 24–36)

## 2023-04-14 LAB — ETHANOL: Alcohol, Ethyl (B): <10 mg/dL (ref <10)

## 2023-04-14 MED ORDER — ALBUTEROL SULFATE (2.5 MG/3ML) 0.083% IN NEBU: 2.5000 mg | INHALATION_SOLUTION | RESPIRATORY_TRACT | Status: DC | PRN

## 2023-04-14 MED ORDER — INSULIN ASPART 100 UNIT/ML IJ SOLN
0.0000 [IU] | Freq: Three times a day (TID) | INTRAMUSCULAR | Status: DC
  Administered 2023-04-14: 2 [IU] via SUBCUTANEOUS
  Administered 2023-04-15: 3 [IU] via SUBCUTANEOUS
  Administered 2023-04-15: 2 [IU] via SUBCUTANEOUS
  Administered 2023-04-15 – 2023-04-16 (×3): 3 [IU] via SUBCUTANEOUS
  Administered 2023-04-16: 2 [IU] via SUBCUTANEOUS
  Administered 2023-04-17: 8 [IU] via SUBCUTANEOUS
  Administered 2023-04-17: 5 [IU] via SUBCUTANEOUS
  Administered 2023-04-17: 2 [IU] via SUBCUTANEOUS
  Administered 2023-04-18: 3 [IU] via SUBCUTANEOUS
  Administered 2023-04-18: 5 [IU] via SUBCUTANEOUS
  Filled 2023-04-14: qty 0.15

## 2023-04-14 MED ORDER — ACETAMINOPHEN 325 MG PO TABS: 650.0000 mg | ORAL_TABLET | Freq: Four times a day (QID) | ORAL | Status: DC | PRN

## 2023-04-14 MED ORDER — ONDANSETRON HCL 4 MG PO TABS: 4.0000 mg | ORAL_TABLET | Freq: Four times a day (QID) | ORAL | Status: DC | PRN

## 2023-04-14 MED ORDER — SODIUM CHLORIDE 0.9 % IV SOLN
1.0000 g | Freq: Once | INTRAVENOUS | Status: AC
  Administered 2023-04-14: 1 g via INTRAVENOUS
  Filled 2023-04-14: qty 10

## 2023-04-14 MED ORDER — INSULIN ASPART 100 UNIT/ML IJ SOLN
0.0000 [IU] | Freq: Every day | INTRAMUSCULAR | Status: DC
  Administered 2023-04-15: 2 [IU] via SUBCUTANEOUS
  Filled 2023-04-14: qty 0.05

## 2023-04-14 MED ORDER — SIMVASTATIN 20 MG PO TABS
20.0000 mg | ORAL_TABLET | Freq: Every day | ORAL | Status: DC
  Administered 2023-04-14 – 2023-04-17 (×4): 20 mg via ORAL
  Filled 2023-04-14 (×4): qty 1

## 2023-04-14 MED ORDER — LABETALOL HCL 5 MG/ML IV SOLN
10.0000 mg | Freq: Once | INTRAVENOUS | Status: AC
  Administered 2023-04-14: 10 mg via INTRAVENOUS
  Filled 2023-04-14: qty 4

## 2023-04-14 MED ORDER — ONDANSETRON HCL 4 MG/2ML IJ SOLN: 4.0000 mg | Freq: Four times a day (QID) | INTRAMUSCULAR | Status: DC | PRN

## 2023-04-14 MED ORDER — TRAZODONE HCL 50 MG PO TABS
25.0000 mg | ORAL_TABLET | Freq: Every evening | ORAL | Status: DC | PRN
  Administered 2023-04-14 – 2023-04-16 (×2): 25 mg via ORAL
  Filled 2023-04-14 (×2): qty 1

## 2023-04-14 MED ORDER — ENOXAPARIN SODIUM 40 MG/0.4ML IJ SOSY
40.0000 mg | PREFILLED_SYRINGE | INTRAMUSCULAR | Status: DC
  Administered 2023-04-14 – 2023-04-17 (×4): 40 mg via SUBCUTANEOUS
  Filled 2023-04-14 (×4): qty 0.4

## 2023-04-14 MED ORDER — HYDRALAZINE HCL 20 MG/ML IJ SOLN: 10.0000 mg | Freq: Four times a day (QID) | INTRAMUSCULAR | Status: DC | PRN

## 2023-04-14 MED ORDER — ACETAMINOPHEN 650 MG RE SUPP: 650.0000 mg | Freq: Four times a day (QID) | RECTAL | Status: DC | PRN

## 2023-04-14 MED ORDER — LEVOTHYROXINE SODIUM 50 MCG PO TABS
50.0000 ug | ORAL_TABLET | Freq: Every day | ORAL | Status: DC
  Administered 2023-04-15 – 2023-04-18 (×4): 50 ug via ORAL
  Filled 2023-04-14 (×4): qty 1

## 2023-04-14 MED ORDER — IRBESARTAN 150 MG PO TABS
150.0000 mg | ORAL_TABLET | Freq: Every day | ORAL | Status: DC
  Administered 2023-04-15 – 2023-04-18 (×4): 150 mg via ORAL
  Filled 2023-04-14 (×4): qty 1

## 2023-04-14 MED ORDER — RIVASTIGMINE TARTRATE 1.5 MG PO CAPS
1.5000 mg | ORAL_CAPSULE | Freq: Two times a day (BID) | ORAL | Status: DC
  Administered 2023-04-14 – 2023-04-18 (×8): 1.5 mg via ORAL
  Filled 2023-04-14 (×9): qty 1

## 2023-04-14 MED ORDER — HYDRALAZINE HCL 25 MG PO TABS
25.0000 mg | ORAL_TABLET | ORAL | Status: AC
  Administered 2023-04-14: 25 mg via ORAL
  Filled 2023-04-14: qty 1

## 2023-04-14 MED ORDER — HYDRALAZINE HCL 20 MG/ML IJ SOLN
10.0000 mg | Freq: Four times a day (QID) | INTRAMUSCULAR | Status: DC | PRN
Start: 2023-04-14 — End: 2023-04-14

## 2023-04-14 MED ORDER — NEBIVOLOL HCL 10 MG PO TABS
5.0000 mg | ORAL_TABLET | Freq: Every day | ORAL | Status: DC
  Administered 2023-04-14 – 2023-04-17 (×4): 5 mg via ORAL
  Filled 2023-04-14 (×5): qty 1

## 2023-04-14 MED ORDER — SODIUM CHLORIDE 0.9 % IV SOLN
1.0000 g | INTRAVENOUS | Status: DC
  Administered 2023-04-15 – 2023-04-17 (×3): 1 g via INTRAVENOUS
  Filled 2023-04-14 (×3): qty 10

## 2023-04-14 NOTE — ED Triage Notes (Signed)
Pt arrived POV. 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 Reports he was not able to get her words out. Also reports forgetfulness that started yesterday. Denies cp, dizziness, tingling/ States generalized weakness. Denies pain or any other symptoms

## 2023-04-14 NOTE — ED Provider Triage Note (Signed)
Emergency Medicine Provider Triage Evaluation Note  Kristina [name] , a 77 y.o. female  was evaluated in triage.  Pt complains of difficulty finding words or using inappropriate words when speaking since yesterday.  Patient's LKW yesterday morning.  She has history of hyponatremia and presents with similar symptoms.  Patient admitted 03/07/23 for aphasia, Na was 119.    Review of Systems  Positive: As above Negative: As above  Physical Exam  There were no vitals taken for this visit. Gen:   Awake, no distress   Resp:  Normal effort  MSK:   Moves extremities without difficulty  Other:  No pronator drift, grip strength equal, no facial droop or slurring of speech, EOM intact; patient does pause when speaking to find right words but is alert and oriented, answering questions appropriately  Medical Decision Making  Medically screening exam initiated at 11:24 AM.  Appropriate orders placed.  MICHALLE KALLENBACH was informed that the remainder of the evaluation will be completed by another provider, this initial triage assessment does not replace that evaluation, and the importance of remaining in the ED until their evaluation is complete.     Melton Alar R, PA-C 04/14/23 1127

## 2023-04-14 NOTE — ED Provider Notes (Signed)
[place] EMERGENCY DEPARTMENT AT [facility] Provider Note   CSN: [id number] Arrival date & time: 04/14/23  1105     History  Chief Complaint  Patient presents with   Aphasia    AABRIELLA BENDON is a 77 y.o. female.  77 year old female with a history of HTN, HLD, heart failure with preserved ejection fraction, and ulcerative colitis who presents to the emergency department with difficulty speaking.  Patient reports that on Friday she noticed that she was starting to have some difficulty speaking.  Last night was on the phone with a friend and also was having some difficulty figuring out how to use her phone and the voicemail.  Says that she has also had difficulty thinking of words and with fluency of speech.  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  Last time this happened was due to hyponatremia.  No weakness, numbness, facial droop, or vision changes.       Home Medications Prior to Admission medications   Medication Sig Start Date End Date Taking? Authorizing Provider  acetaminophen (TYLENOL) 650 MG CR tablet Take 650-1,300 mg by mouth 2 (two) times daily as needed (knee pain).   Yes [provider]  Cyanocobalamin (VITAMIN B-12 PO) Take 1 tablet by mouth daily.   Yes [provider]  hyoscyamine (LEVSIN) 0.125 MG tablet Take 0.125 mg by mouth 2 (two) times daily as needed for cramping. 01/04/20  Yes [provider]  levothyroxine (SYNTHROID) 50 MCG tablet Take 50 mcg by mouth every morning. 01/20/22  Yes [provider]  mesalamine (APRISO) 0.375 g 24 hr capsule Take 1,125 mg by mouth daily. 08/28/18  Yes [provider]  metFORMIN (GLUCOPHAGE-XR) 500 MG 24 hr tablet Take 3 tablets (1,500 mg total) by mouth daily. Patient taking differently: Take 1,500 mg by mouth at bedtime. 04/05/18  Yes Randel Pigg, [name], MD   nebivolol (BYSTOLIC) 5 MG tablet Take 5 mg by mouth at bedtime.   Yes [provider]  simvastatin (ZOCOR) 20 MG tablet Take 20 mg by mouth at bedtime. 08/05/13  Yes [provider]  valsartan (DIOVAN) 160 MG tablet Take 1 tablet (160 mg total) by mouth daily. 02/28/22 05/11/23 Yes Meredeth Ide, MD  rivastigmine (EXELON) 1.5 MG capsule Take 1 capsule (1.5 mg total) by mouth 2 (two) times daily. 03/28/23 10/24/23  Windell Norfolk, MD      Allergies    Ace inhibitors, Cipro [ciprofloxacin hcl], Vibra-tab [doxycycline], and Shellfish allergy    Review of Systems   Review of Systems  Physical Exam Updated Vital Signs BP (!) 187/73 (BP Location: Left Arm)   Pulse 91   Temp 98.1 F (36.7 C) (Oral)   Resp 17   Ht 5\' 4"  (1.626 m)   Wt 66.3 kg   SpO2 98%   BMI 25.09 kg/m  Physical Exam Vitals and nursing note reviewed.  Constitutional:      General: She is not in acute distress.    Appearance: She is well-developed.     Comments: Does have some difficulty following commands and stuttering frequently and from lying to find words during exam  HENT:     Head: Normocephalic and atraumatic.     Right Ear: External ear normal.     Left Ear: External ear normal.     Nose: Nose normal.  Eyes:     Extraocular Movements: Extraocular  movements intact.     Conjunctiva/sclera: Conjunctivae normal.     Pupils: Pupils are equal, round, and reactive to light.  Cardiovascular:     Rate and Rhythm: Normal rate and regular rhythm.  Pulmonary:     Effort: Pulmonary effort is normal. No respiratory distress.  Musculoskeletal:     Cervical back: Normal range of motion and neck supple.     Right lower leg: No edema.     Left lower leg: No edema.  Skin:    General: Skin is warm and dry.  Neurological:     Mental Status: She is alert and oriented to person, place, and time.     Comments: NIHSS Exam  Level of Consciousness: Alert  LOC Questions: Answers age correctly but not  month LOC Commands: Opens and Closes Eyes and Hands on command  Best Gaze: Horizontal ocular movements intact  Visual Fields: No visual field loss  Facial Palsy: None  L Upper Extremity Motor: No drift after 10 seconds  R Upper Extremity Motor: No drift after 10 seconds  L Lower extremity Motor: No drift after 5 seconds  R Lower extremity Motor: No drift after 5 seconds  Ataxia: Absent  Sensory: Intact sensation to light touch on face, arms, trunk, and legs bilaterally  Best Language: No aphasia  Dysarthria: No dysarthria  Neglect: No visual or sensory neglect    Psychiatric:        Mood and Affect: Mood normal.     ED Results / Procedures / Treatments   Labs (all labs ordered are listed, but only abnormal results are displayed) Labs Reviewed  COMPREHENSIVE METABOLIC PANEL - Abnormal; Notable for the following components:      Result Value   Glucose, Bld 160 (*)    All other components within normal limits  URINALYSIS, ROUTINE W REFLEX MICROSCOPIC - Abnormal; Notable for the following components:   Color, Urine STRAW (*)    Specific Gravity, Urine 1.003 (*)    Hgb urine dipstick SMALL (*)    Leukocytes,Ua TRACE (*)    Bacteria, UA MANY (*)    All other components within normal limits  GLUCOSE, CAPILLARY - Abnormal; Notable for the following components:   Glucose-Capillary 143 (*)    All other components within normal limits  I-STAT CHEM 8, ED - Abnormal; Notable for the following components:   Glucose, Bld 157 (*)    All other components within normal limits  URINE CULTURE  PROTIME-INR  APTT  CBC  DIFFERENTIAL  ETHANOL  RAPID URINE DRUG SCREEN, HOSP PERFORMED  BASIC METABOLIC PANEL  CBC  TSH    EKG EKG Interpretation Date/Time:  Sunday April 14 2023 13:21:16 EDT Ventricular Rate:  84 PR Interval:  160 QRS Duration:  74 QT Interval:  353 QTC Calculation: 418 R Axis:   20  Text Interpretation: Sinus rhythm Confirmed by Vonita Moss [phone]) on  04/14/2023 3:26:02 PM  Radiology MR BRAIN WO CONTRAST  Result Date: 04/14/2023 CLINICAL DATA:  Aphasia EXAM: MRI HEAD WITHOUT CONTRAST TECHNIQUE: Multiplanar, multiecho pulse sequences of the brain and surrounding structures were obtained without intravenous contrast. COMPARISON:  same-day CT head, brain MRI 03/07/2023 FINDINGS: Brain: There is no acute intracranial hemorrhage, extra-axial fluid collection, or acute infarct. Parenchymal volume is stable. The ventricles are stable in size. 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 The pituitary and suprasellar region are normal. There is no mass lesion. There is no mass effect  or midline shift. Vascular: Normal flow voids. Skull and upper cervical spine: Normal marrow signal. Sinuses/Orbits: The paranasal sinuses are clear. Bilateral lens implants are in place. The globes and orbits are otherwise unremarkable. Other: The mastoid air cells and middle ear cavities are clear. IMPRESSION: Stable brain MRI with no acute intracranial pathology. Electronically Signed   By: Lesia Hausen M.D.   On: 04/14/2023 15:44   CT HEAD WO CONTRAST  Result Date: 04/14/2023 CLINICAL DATA:  Cognitive issues. Difficult to to comprehend words. Concern for stroke occurring yesterday. EXAM: CT HEAD WITHOUT CONTRAST TECHNIQUE: Contiguous axial images were obtained from the base of the skull through the vertex without intravenous contrast. RADIATION DOSE REDUCTION: This exam was performed according to the departmental dose-optimization program which includes automated exposure control, adjustment of the mA and/or kV according to patient size and/or use of iterative reconstruction technique. COMPARISON:  CTA head and neck and MRI of the head dated 03/07/2023. FINDINGS: Brain: No evidence of acute infarction, hemorrhage, hydrocephalus, extra-axial collection or mass lesion/mass effect. White matter hypoattenuation  is noted consistent with chronic microvascular ischemic change, stable from the prior exams. Vascular: No hyperdense vessel or unexpected calcification. Skull: Normal. Negative for fracture or focal lesion. Sinuses/Orbits: Globes and orbits are unremarkable. Sinuses are clear. Other: None. IMPRESSION: 1. No acute intracranial abnormalities. 2. No change from the prior studies. Electronically Signed   By: Amie Portland M.D.   On: 04/14/2023 12:04    Procedures Procedures    Medications Ordered in ED Medications  cefTRIAXone (ROCEPHIN) 1 g in sodium chloride 0.9 % 100 mL IVPB (has no administration in time range)  simvastatin (ZOCOR) tablet 20 mg (has no administration in time range)  irbesartan (AVAPRO) tablet 150 mg (has no administration in time range)  rivastigmine (EXELON) capsule 1.5 mg (has no administration in time range)  levothyroxine (SYNTHROID) tablet 50 mcg (has no administration in time range)  enoxaparin (LOVENOX) injection 40 mg (has no administration in time range)  insulin aspart (novoLOG) injection 0-15 Units (2 Units Subcutaneous Given 04/14/23 1734)  insulin aspart (novoLOG) injection 0-5 Units (has no administration in time range)  acetaminophen (TYLENOL) tablet 650 mg (has no administration in time range)    Or  acetaminophen (TYLENOL) suppository 650 mg (has no administration in time range)  traZODone (DESYREL) tablet 25 mg (has no administration in time range)  ondansetron (ZOFRAN) tablet 4 mg (has no administration in time range)    Or  ondansetron (ZOFRAN) injection 4 mg (has no administration in time range)  albuterol (PROVENTIL) (2.5 MG/3ML) 0.083% nebulizer solution 2.5 mg (has no administration in time range)  hydrALAZINE (APRESOLINE) injection 10 mg (has no administration in time range)  nebivolol (BYSTOLIC) tablet 5 mg (has no administration in time range)  labetalol (NORMODYNE) injection 10 mg (10 mg Intravenous Given 04/14/23 1413)  hydrALAZINE (APRESOLINE)  tablet 25 mg (25 mg Oral Given 04/14/23 1605)  cefTRIAXone (ROCEPHIN) 1 g in sodium chloride 0.9 % 100 mL IVPB (0 g Intravenous Stopped 04/14/23 1655)    ED Course/ Medical Decision Making/ A&P Clinical Course as of 04/14/23 1807  Sun Apr 14, 2023  1402 Dr Wilford Corner consulted.  Recommends following up after MRI. [RP]  1526 Dr Wilford Corner has reviewed imaging.  Does not feel TIA is likely.  Thinks it may be due to her UTI.  Recommends eeg if her symptoms do not improve and she is admitted.  [RP]  1613 Dr Kirby Crigler from hospitalist to admit the patient [RP]  Clinical Course User Index [RP] Rondel Baton, MD                                 Medical Decision Making Amount and/or Complexity of Data Reviewed Labs: ordered. Radiology: ordered.  Risk Prescription drug management. Decision regarding hospitalization.   ANAID GUTH is a 77 y.o. female with comorbidities that complicate the patient evaluation including HTN, HLD, heart failure with preserved ejection fraction, and ulcerative colitis who presents to the emergency department with difficulty speaking.    Initial Ddx:  Stroke, TIA, hypertensive encephalopathy, hyponatremia, UTI  MDM/Course:  Patient presents to the emergency department with speech difficulty.  This apparently has been going on since Friday so outside of the window for any sort of intervention if it were stroke.  No focal neurologic deficits on exam and technically does not have aphasia based on her NIH stroke scale screen but does have some difficulty following commands on exam.  Sodium was WNL.  CT head without acute findings.  Also had an MRI of the brain does not show evidence of stroke.  Urinalysis does show that she has a UTI.  Was significantly hypertensive but not significantly confused make me think that she has hypertensive encephalopathy.  However her blood pressure was as high as 200 systolic in the emergency department.  Upon re-evaluation patient remained  stable.  Given her elevated blood pressure and difficulty speaking we have admitted the patient to medicine.  Did discuss with neurology and they recommended that if her symptoms do not improve that she should be evaluated for EEG.  This patient presents to the ED for concern of complaints listed in HPI, this involves an extensive number of treatment options, and is a complaint that carries with it a high risk of complications and morbidity. Disposition including potential need for admission considered.   Dispo: Admit to Floor  Additional history obtained from  friend Records reviewed DC Summary The following labs were independently interpreted: Chemistry and show no acute abnormality I independently reviewed the following imaging with scope of interpretation limited to determining acute life threatening conditions related to emergency care: CT Head and agree with the radiologist interpretation with the following exceptions: none I personally reviewed and interpreted cardiac monitoring: normal sinus rhythm  I personally reviewed and interpreted the pt's EKG: see above for interpretation  I have reviewed the patients home medications and made adjustments as needed Consults: Hospitalist and Neurology Social Determinants of health:  Elderly         Final Clinical Impression(s) / ED Diagnoses Final diagnoses:  Difficulty speaking  Acute cystitis without hematuria  Hypertensive urgency    Rx / DC Orders ED Discharge Orders     None         Rondel Baton, MD 04/14/23 1807

## 2023-04-14 NOTE — ED Notes (Signed)
Patient transported to CT 

## 2023-04-14 NOTE — ED Notes (Signed)
Pt is off the floor  MRI, will get vitals on return

## 2023-04-14 NOTE — H&P (Signed)
History and Physical  PAULA GASSLER WUJ:[id number] DOB: 1946-01-18 DOA: 04/14/2023  PCP: Shon Hale, MD   Chief Complaint: Word finding difficulty  HPI: Kristina [name] is a 77 y.o. female with medical history significant for labile hypertension, GERD, diabetes, hyponatremia being admitted to the hospital with word finding difficulty.  History is provided by the patient as well as her sister who is at the bedside, they state that in the last year she has had a couple of episodes of word finding difficulty that seem to be related to hyponatremia.  Earlier this year she also was seen by neurology, who diagnosed her with mild cognitive impairment and started her on Aricept.  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  She knew what she wanted to say, but was having a hard time forming the words.  Denies any other symptoms such as numbness, tingling, dizziness, extremity weakness, etc.  She had her sister bring her to the emergency department.  ED Course: In the emergency department, vital signs are unremarkable except for uncontrolled blood pressure as high as 197/95.  Patient and her sister state that she has whitecoat hypertension, and sometimes her blood pressure actually drops low at home.  Lab work including CBC and CMP unremarkable.  Urinalysis consistent with UTI.  She had CT scan of the head, as well as MRI of the brain both of which were unremarkable for any acute process.  ER provider discussed with on-call neurology, who recommends treatment of her blood pressure and UTI, and potential further workup including EEG if symptoms do not resolve.  On my interview with the patient, she is speaking well and already feels like her symptoms are resolving.  Review of Systems: Please see HPI for pertinent positives and negatives. A complete 10 system review of systems are  otherwise negative.  Past Medical History:  Diagnosis Date   Anxiety    Asthma    Diabetes (HCC)    Diabetes mellitus without complication (HCC)    Diverticulitis    GERD (gastroesophageal reflux disease)    HTN (hypertension)    Hypercholesteremia    Hyperlipidemia    Hypertension    Obesity    Rosacea    Thyroid disease    Ulcerative colitis (HCC)    Past Surgical History:  Procedure Laterality Date   ACHILLES TENDON REPAIR     APPENDECTOMY     BIOPSY  10/16/2018   Procedure: BIOPSY;  Surgeon: Kathi Der, MD;  Location: [facility] ENDOS[facility];  Service: Gastroenterology;;   CESAREAN SECTION     CHOLECYSTECTOMY     FLEXIBLE SIGMOIDOSCOPY N/A 10/16/2018   Procedure: FLEXIBLE SIGMOIDOSCOPY;  Surgeon: Kathi Der, MD;  Location: [facility] ENDOSCOPY;  Service: Gastroenterology;  Laterality: N/A;   hammertoe      Social History:  reports that she has never smoked. She has never used smokeless tobacco. She reports that she does not drink alcohol and does not use drugs.   Allergies  Allergen Reactions   Ace Inhibitors Cough   Cipro [Ciprofloxacin Hcl] Other (See Comments)    Tendonitis and tendon rupture, achilles tendon issue - posterior right LE   Vibra-Tab [Doxycycline] Other (See Comments)    Unknown reaction   Shellfish Allergy Diarrhea, Nausea And Vomiting, Rash and Other (See Comments)    Purple spots on skin    Family History  Problem Relation Age of Onset  Hypertension Mother    Heart failure Mother    Heart attack Mother    Hypotension Father    Dementia Father      Prior to Admission medications   Medication Sig Start Date End Date Taking? Authorizing Provider  Cyanocobalamin (VITAMIN B-12 PO) Take 1 tablet by mouth daily.   Yes [provider]  levothyroxine (SYNTHROID) 50 MCG tablet Take 50 mcg by mouth every morning. 01/20/22  Yes [provider]  metFORMIN (GLUCOPHAGE-XR) 500 MG 24 hr tablet Take 3 tablets (1,500 mg total) by mouth  daily. Patient taking differently: Take 1,500 mg by mouth at bedtime. 04/05/18  Yes Randel Pigg, [name], MD  simvastatin (ZOCOR) 20 MG tablet Take 20 mg by mouth at bedtime. 08/05/13  Yes [provider]  valsartan (DIOVAN) 160 MG tablet Take 1 tablet (160 mg total) by mouth daily. 02/28/22 05/11/23 Yes Meredeth Ide, MD  acetaminophen (TYLENOL) 650 MG CR tablet Take 650-1,300 mg by mouth 2 (two) times daily as needed (knee pain).    [provider]  hyoscyamine (LEVSIN) 0.125 MG tablet Take 0.0625 mg by mouth 2 (two) times daily as needed for cramping. 01/04/20   [provider]  mesalamine (APRISO) 0.375 g 24 hr capsule Take 1,125 mg by mouth daily. 08/28/18   [provider]  rivastigmine (EXELON) 1.5 MG capsule Take 1 capsule (1.5 mg total) by mouth 2 (two) times daily. 03/28/23 10/24/23  Windell Norfolk, MD    Physical Exam: BP (!) 180/86   Pulse 88   Temp 99.3 F (37.4 C) (Oral)   Resp 18   SpO2 97%   General:  Alert, oriented, calm, in no acute distress, speaking fluently and clearly without any word finding difficulty, no slurred speech.  Her sister is at the bedside. Eyes: EOMI, clear conjuctivae, white sclerea Neck: supple, no masses, trachea mildline  Cardiovascular: RRR, no murmurs or rubs, no peripheral edema  Respiratory: clear to auscultation bilaterally, no wheezes, no crackles  Abdomen: soft, nontender, nondistended, normal bowel tones heard  Skin: dry, no rashes  Musculoskeletal: no joint effusions, normal range of motion  Psychiatric: appropriate affect, normal speech  Neurologic: extraocular muscles intact, clear speech, moving all extremities with intact sensorium         Labs on Admission:  Basic Metabolic Panel: Recent Labs  Lab 04/14/23 1248 04/14/23 1254  NA 136 139  K 4.0 4.3  CL 102 101  CO2 26  --   GLUCOSE 160* 157*  BUN 10 9  CREATININE 0.70 0.70  CALCIUM 9.5  --    Liver Function Tests: Recent Labs  Lab  04/14/23 1248  AST 15  ALT 11  ALKPHOS 65  BILITOT 0.8  PROT 7.2  ALBUMIN 4.2   No results for input(s): "LIPASE", "AMYLASE" in the last 168 hours. No results for input(s): "AMMONIA" in the last 168 hours. CBC: Recent Labs  Lab 04/14/23 1248 04/14/23 1254  WBC 7.0  --   NEUTROABS 5.8  --   HGB 13.4 13.9  HCT 40.7 41.0  MCV 97.4  --   PLT 232  --    Cardiac Enzymes: No results for input(s): "CKTOTAL", "CKMB", "CKMBINDEX", "TROPONINI" in the last 168 hours.  BNP (last 3 results) No results for input(s): "BNP" in the last 8760 hours.  ProBNP (last 3 results) No results for input(s): "PROBNP" in the last 8760 hours.  CBG: No results for input(s): "GLUCAP" in the last 168 hours.  Radiological Exams on Admission:  MR BRAIN WO CONTRAST  Result Date: 04/14/2023 CLINICAL DATA:  Aphasia EXAM: MRI HEAD WITHOUT CONTRAST TECHNIQUE: Multiplanar, multiecho pulse sequences of the brain and surrounding structures were obtained without intravenous contrast. COMPARISON:  same-day CT head, brain MRI 03/07/2023 FINDINGS: Brain: There is no acute intracranial hemorrhage, extra-axial fluid collection, or acute infarct. Parenchymal volume is stable. The ventricles are stable in size. 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 The pituitary and suprasellar region are normal. There is no mass lesion. There is no mass effect or midline shift. Vascular: Normal flow voids. Skull and upper cervical spine: Normal marrow signal. Sinuses/Orbits: The paranasal sinuses are clear. Bilateral lens implants are in place. The globes and orbits are otherwise unremarkable. Other: The mastoid air cells and middle ear cavities are clear. IMPRESSION: Stable brain MRI with no acute intracranial pathology. Electronically Signed   By: Lesia Hausen M.D.   On: 04/14/2023 15:44   CT HEAD WO CONTRAST  Result Date: 04/14/2023 CLINICAL DATA:  Cognitive  issues. Difficult to to comprehend words. Concern for stroke occurring yesterday. EXAM: CT HEAD WITHOUT CONTRAST TECHNIQUE: Contiguous axial images were obtained from the base of the skull through the vertex without intravenous contrast. RADIATION DOSE REDUCTION: This exam was performed according to the departmental dose-optimization program which includes automated exposure control, adjustment of the mA and/or kV according to patient size and/or use of iterative reconstruction technique. COMPARISON:  CTA head and neck and MRI of the head dated 03/07/2023. FINDINGS: Brain: No evidence of acute infarction, hemorrhage, hydrocephalus, extra-axial collection or mass lesion/mass effect. White matter hypoattenuation is noted consistent with chronic microvascular ischemic change, stable from the prior exams. Vascular: No hyperdense vessel or unexpected calcification. Skull: Normal. Negative for fracture or focal lesion. Sinuses/Orbits: Globes and orbits are unremarkable. Sinuses are clear. Other: None. IMPRESSION: 1. No acute intracranial abnormalities. 2. No change from the prior studies. Electronically Signed   By: Amie Portland M.D.   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  Word-finding difficulty-potentially related to otherwise unrecognized UTI, or hypertensive emergency.  She received a dose of IV Rocephin in the emergency department, and oral hydralazine after which her blood pressure is improving.  Her neurologic symptoms are also improving.  Workup in the emergency department is benign. -Observation admission -Treat UTI and hypertension as below -Consider inpatient neurology consultation and further workup if symptoms do not continue to  improve  UTI-patient denies any fevers, dysuria, urgency or frequency -Follow-up urine culture -Continue empiric IV Rocephin  Hypertension-historically this has been difficult to control, with whitecoat hypertension as well as labile blood pressures. -Continue irbesartan and nebivolol -IV hydralazine as needed for SBP greater than 180  Hypothyroidism-continue Synthroid, check TSH  Cognitive impairment-stable off Aricept recently, prescribed Exelon but has not started it yet -Will initiate Exelon 1.5 mg p.o. twice daily  Hyperlipidemia-Zocor  DVT prophylaxis: Lovenox     Code Status: Full Code  Consults called: None  Admission status: Observation  Time spent: 56 minutes  Korynne Dols Sharlette Dense MD [facility] Pager [phone]  If 7PM-7AM, please contact night-coverage [email or web address] Password [facility]  04/14/2023, 4:21 PM

## 2023-04-14 NOTE — ED Notes (Signed)
ED TO INPATIENT HANDOFF REPORT  ED Nurse Name and Phone #: Tia, RN   S Name/Age/Gender Kristina [name] 77 y.o. female Room/Bed: WA16/WA16  Code Status   Code Status: Prior  Home/SNF/Other Home Patient oriented to: self, place, and situation Is this baseline? Yes   Triage Complete: Triage complete  Chief Complaint "unable to get her words out to make sentence make sense"  Triage Note Pt arrived POV. 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 Reports he was not able to get her words out. Also reports forgetfulness that started yesterday. Denies cp, dizziness, tingling/ States generalized weakness. Denies pain or any other symptoms   Allergies Allergies  Allergen Reactions   Ace Inhibitors Cough   Cipro [Ciprofloxacin Hcl] Other (See Comments)    Tendonitis and tendon rupture, achilles tendon issue - posterior right LE   Vibra-Tab [Doxycycline] Other (See Comments)    Unknown reaction   Shellfish Allergy Diarrhea, Nausea And Vomiting, Rash and Other (See Comments)    Purple spots on skin    Level of Care/Admitting Diagnosis ED Disposition     ED Disposition  Admit   Condition  --   Comment  The patient appears reasonably stabilized for admission considering the current resources, flow, and capabilities available in the ED at this time, and I doubt any other [facility] requiring further screening and/or treatment in the ED prior to admission is  present.          B Medical/Surgery History Past Medical History:  Diagnosis Date   Anxiety    Asthma    Diabetes (HCC)    Diabetes mellitus without complication (HCC)    Diverticulitis    GERD (gastroesophageal reflux disease)    HTN (hypertension)    Hypercholesteremia    Hyperlipidemia    Hypertension    Obesity    Rosacea    Thyroid disease    Ulcerative colitis (HCC)    Past Surgical History:  Procedure Laterality Date   ACHILLES TENDON REPAIR     APPENDECTOMY      BIOPSY  10/16/2018   Procedure: BIOPSY;  Surgeon: Kathi Der, MD;  Location: [facility];  Service: Gastroenterology;;   CESAREAN SECTION     CHOLECYSTECTOMY     FLEXIBLE SIGMOIDOSCOPY N/A 10/16/2018   Procedure: FLEXIBLE SIGMOIDOSCOPY;  Surgeon: Kathi Der, MD;  Location: [facility];  Service: Gastroenterology;  Laterality: N/A;   hammertoe       A IV Location/Drains/Wounds Patient Lines/Drains/Airways Status     Active Line/Drains/Airways     Name Placement date Placement time Site Days   Peripheral IV 04/14/23 20 G 1" Left Forearm 04/14/23  1413  Forearm  less than 1            Intake/Output Last 24 hours No intake or output data in the 24 hours ending 04/14/23 1602  Labs/Imaging Results for orders placed or performed during the hospital encounter of 04/14/23 (from the past 48 hour(s))  Protime-INR     Status: None   Collection Time: 04/14/23 12:48 PM  Result Value Ref Range   Prothrombin Time 14.3 11.4 - 15.2 seconds   INR 1.1 0.8 - 1.2    Comment: (NOTE) INR goal varies based on device and disease states. Performed at [facility], [address] [place], [place] [address]   APTT     Status: None   Collection Time: 04/14/23 12:48 PM  Result Value Ref Range  aPTT 27 24 - 36 seconds    Comment: Performed at [facility], [address] [place] [address] [place], [place] [address]  CBC     Status: None   Collection Time: 04/14/23 12:48 PM  Result Value Ref Range   WBC 7.0 4.0 - 10.5 K/uL   RBC 4.18 3.87 - 5.11 MIL/uL   Hemoglobin 13.4 12.0 - 15.0 g/dL   HCT 57.8 46.9 - 62.9 %   MCV 97.4 80.0 - 100.0 fL   MCH 32.1 26.0 - 34.0 pg   MCHC 32.9 30.0 - 36.0 g/dL   RDW 52.8 41.3 - 24.4 %   Platelets 232 150 - 400 K/uL   nRBC 0.0 0.0 - 0.2 %    Comment: Performed at [facility], [address] [place], [place] [address]  Differential     Status: None   Collection Time: 04/14/23 12:48 PM  Result Value Ref  Range   Neutrophils Relative % 82 %   Neutro Abs 5.8 1.7 - 7.7 K/uL   Lymphocytes Relative 11 %   Lymphs Abs 0.8 0.7 - 4.0 K/uL   Monocytes Relative 5 %   Monocytes Absolute 0.3 0.1 - 1.0 K/uL   Eosinophils Relative 1 %   Eosinophils Absolute 0.1 0.0 - 0.5 K/uL   Basophils Relative 1 %   Basophils Absolute 0.0 0.0 - 0.1 K/uL   Immature Granulocytes 0 %   Abs Immature Granulocytes 0.01 0.00 - 0.07 K/uL    Comment: Performed at [facility], [address], [place], [place] [address]  Comprehensive metabolic panel     Status: Abnormal   Collection Time: 04/14/23 12:48 PM  Result Value Ref Range   Sodium 136 135 - 145 mmol/L   Potassium 4.0 3.5 - 5.1 mmol/L   Chloride 102 98 - 111 mmol/L   CO2 26 22 - 32 mmol/L   Glucose, Bld 160 (H) 70 - 99 mg/dL    Comment: Glucose reference range applies only to samples taken after fasting for at least 8 hours.   BUN 10 8 - 23 mg/dL   Creatinine, Ser 4.40 0.44 - 1.00 mg/dL   Calcium 9.5 8.9 - 34.7 mg/dL   Total Protein 7.2 6.5 - 8.1 g/dL   Albumin 4.2 3.5 - 5.0 g/dL   AST 15 15 - 41 U/L   ALT 11 0 - 44 U/L   Alkaline Phosphatase 65 38 - 126 U/L   Total Bilirubin 0.8 0.3 - 1.2 mg/dL   GFR, Estimated >42 >59 mL/min    Comment: (NOTE) Calculated using the CKD-EPI Creatinine Equation (2021)    Anion gap 8 5 - 15    Comment: Performed at [facility], [address], [place], [place] [address]  Ethanol     Status: None   Collection Time: 04/14/23 12:48 PM  Result Value Ref Range   Alcohol, Ethyl (B) <10 <10 mg/dL    Comment: (NOTE) Lowest detectable limit for serum alcohol is 10 mg/dL.  For medical purposes only. Performed at [facility], [address] [place], [place] [address]   I-stat chem 8, ED     Status: Abnormal   Collection Time: 04/14/23 12:54 PM  Result Value Ref Range   Sodium 139 135 - 145 mmol/L   Potassium 4.3 3.5 - 5.1 mmol/L   Chloride 101 98 - 111 mmol/L    BUN 9 8 - 23 mg/dL   Creatinine, Ser 2.95 0.44 - 1.00 mg/dL  Glucose, Bld 157 (H) 70 - 99 mg/dL    Comment: Glucose reference range applies only to samples taken after fasting for at least 8 hours.   Calcium, Ion 1.22 1.15 - 1.40 mmol/L   TCO2 25 22 - 32 mmol/L   Hemoglobin 13.9 12.0 - 15.0 g/dL   HCT 96.0 45.4 - 09.8 %  Urine rapid drug screen (hosp performed)     Status: None   Collection Time: 04/14/23  2:45 PM  Result Value Ref Range   Opiates NONE DETECTED NONE DETECTED   Cocaine NONE DETECTED NONE DETECTED   Benzodiazepines NONE DETECTED NONE DETECTED   Amphetamines NONE DETECTED NONE DETECTED   Tetrahydrocannabinol NONE DETECTED NONE DETECTED   Barbiturates NONE DETECTED NONE DETECTED    Comment: (NOTE) DRUG SCREEN FOR MEDICAL PURPOSES ONLY.  IF CONFIRMATION IS NEEDED FOR ANY PURPOSE, NOTIFY LAB WITHIN 5 DAYS.  LOWEST DETECTABLE LIMITS FOR URINE DRUG SCREEN Drug Class                     Cutoff (ng/mL) Amphetamine and metabolites    1000 Barbiturate and metabolites    200 Benzodiazepine                 200 Opiates and metabolites        300 Cocaine and metabolites        300 THC                            50 Performed at [facility], [address], [place], [place] [address]   Urinalysis, Routine w reflex microscopic -Urine, Clean Catch     Status: Abnormal   Collection Time: 04/14/23  2:45 PM  Result Value Ref Range   Color, Urine STRAW (A) YELLOW   APPearance CLEAR CLEAR   Specific Gravity, Urine 1.003 (L) 1.005 - 1.030   pH 7.0 5.0 - 8.0   Glucose, UA NEGATIVE NEGATIVE mg/dL   Hgb urine dipstick SMALL (A) NEGATIVE   Bilirubin Urine NEGATIVE NEGATIVE   Ketones, ur NEGATIVE NEGATIVE mg/dL   Protein, ur NEGATIVE NEGATIVE mg/dL   Nitrite NEGATIVE NEGATIVE   Leukocytes,Ua TRACE (A) NEGATIVE   RBC / HPF 11-20 0 - 5 RBC/hpf   WBC, UA 6-10 0 - 5 WBC/hpf   Bacteria, UA MANY (A) NONE SEEN   Squamous Epithelial / HPF 0-5 0 - 5 /HPF    Comment:  Performed at [facility], [address], [place], [place] [address]   MR BRAIN WO CONTRAST  Result Date: 04/14/2023 CLINICAL DATA:  Aphasia EXAM: MRI HEAD WITHOUT CONTRAST TECHNIQUE: Multiplanar, multiecho pulse sequences of the brain and surrounding structures were obtained without intravenous contrast. COMPARISON:  same-day CT head, brain MRI 03/07/2023 FINDINGS: Brain: There is no acute intracranial hemorrhage, extra-axial fluid collection, or acute infarct. Parenchymal volume is stable. The ventricles are stable in size. 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 The pituitary and suprasellar region are normal. There is no mass lesion. There is no mass effect or midline shift. Vascular: Normal flow voids. Skull and upper cervical spine: Normal marrow signal. Sinuses/Orbits: The paranasal sinuses are clear. Bilateral lens implants are in place. The globes and orbits are otherwise unremarkable. Other: The mastoid air cells and middle ear cavities are clear. IMPRESSION: Stable brain MRI with no acute intracranial pathology. Electronically Signed   By:  Lesia Hausen M.D.   On: 04/14/2023 15:44   CT HEAD WO CONTRAST  Result Date: 04/14/2023 CLINICAL DATA:  Cognitive issues. Difficult to to comprehend words. Concern for stroke occurring yesterday. EXAM: CT HEAD WITHOUT CONTRAST TECHNIQUE: Contiguous axial images were obtained from the base of the skull through the vertex without intravenous contrast. RADIATION DOSE REDUCTION: This exam was performed according to the departmental dose-optimization program which includes automated exposure control, adjustment of the mA and/or kV according to patient size and/or use of iterative reconstruction technique. COMPARISON:  CTA head and neck and MRI of the head dated 03/07/2023. FINDINGS: Brain: No evidence of acute infarction, hemorrhage, hydrocephalus, extra-axial collection  or mass lesion/mass effect. White matter hypoattenuation is noted consistent with chronic microvascular ischemic change, stable from the prior exams. Vascular: No hyperdense vessel or unexpected calcification. Skull: Normal. Negative for fracture or focal lesion. Sinuses/Orbits: Globes and orbits are unremarkable. Sinuses are clear. Other: None. IMPRESSION: 1. No acute intracranial abnormalities. 2. No change from the prior studies. Electronically Signed   By: Amie Portland M.D.   On: 04/14/2023 12:04    Pending Labs Unresulted Labs (From admission, onward)     Start     Ordered   04/14/23 1552  Urine Culture  Add-on,   AD       Question:  Indication  Answer:  Urgency/frequency   04/14/23 1551            Vitals/Pain Today's Vitals   04/14/23 1115 04/14/23 1120 04/14/23 1330 04/14/23 1338  BP:  (!) 193/119 (!) 197/95   Pulse:  90 82   Resp:   19   Temp:    99.3 F (37.4 C)  TempSrc:    Oral  SpO2:  99% 96%   PainSc: 0-No pain       Isolation Precautions No active isolations  Medications Medications  hydrALAZINE (APRESOLINE) tablet 25 mg (has no administration in time range)  cefTRIAXone (ROCEPHIN) 1 g in sodium chloride 0.9 % 100 mL IVPB (has no administration in time range)  labetalol (NORMODYNE) injection 10 mg (10 mg Intravenous Given 04/14/23 1413)    Mobility walks     Focused Assessments Neuro Assessment Handoff:  Swallow screen pass? Yes      Last date known well: 04/14/23 Last time known well: 0900 Neuro Assessment: Within Defined Limits Neuro Checks:      Has TPA been given? No If patient is a Neuro Trauma and patient is going to OR before floor call report to 4N Charge nurse: [phone] or [phone]   R Recommendations: See Admitting Provider Note  Report given to:   Additional Notes:

## 2023-04-15 ENCOUNTER — Inpatient Hospital Stay (HOSPITAL_COMMUNITY)
Admit: 2023-04-15 | Discharge: 2023-04-15 | Disposition: A | Discharge status: Home / Self Care | Payer: Medicare PPO | Attending: Internal Medicine

## 2023-04-15 DIAGNOSIS — I5032 Chronic diastolic (congestive) heart failure: Secondary | ICD-10-CM | POA: Diagnosis present

## 2023-04-15 DIAGNOSIS — R4182 Altered mental status, unspecified: Secondary | ICD-10-CM | POA: Diagnosis not present

## 2023-04-15 DIAGNOSIS — R471 Dysarthria and anarthria: Secondary | ICD-10-CM | POA: Diagnosis present

## 2023-04-15 DIAGNOSIS — F419 Anxiety disorder, unspecified: Secondary | ICD-10-CM | POA: Diagnosis present

## 2023-04-15 DIAGNOSIS — E119 Type 2 diabetes mellitus without complications: Secondary | ICD-10-CM | POA: Diagnosis present

## 2023-04-15 DIAGNOSIS — Z6825 Body mass index (BMI) 25.0-25.9, adult: Secondary | ICD-10-CM | POA: Diagnosis not present

## 2023-04-15 DIAGNOSIS — E663 Overweight: Secondary | ICD-10-CM | POA: Diagnosis present

## 2023-04-15 DIAGNOSIS — N390 Urinary tract infection, site not specified: Secondary | ICD-10-CM | POA: Diagnosis present

## 2023-04-15 DIAGNOSIS — Z888 Allergy status to other drugs, medicaments and biological substances status: Secondary | ICD-10-CM | POA: Diagnosis not present

## 2023-04-15 DIAGNOSIS — E7800 Pure hypercholesterolemia, unspecified: Secondary | ICD-10-CM | POA: Diagnosis present

## 2023-04-15 DIAGNOSIS — I160 Hypertensive urgency: Secondary | ICD-10-CM | POA: Diagnosis not present

## 2023-04-15 DIAGNOSIS — F0280 Dementia in other diseases classified elsewhere without behavioral disturbance: Secondary | ICD-10-CM | POA: Diagnosis present

## 2023-04-15 DIAGNOSIS — Z7984 Long term (current) use of oral hypoglycemic drugs: Secondary | ICD-10-CM | POA: Diagnosis not present

## 2023-04-15 DIAGNOSIS — Z79890 Hormone replacement therapy (postmenopausal): Secondary | ICD-10-CM | POA: Diagnosis not present

## 2023-04-15 DIAGNOSIS — J45909 Unspecified asthma, uncomplicated: Secondary | ICD-10-CM | POA: Diagnosis present

## 2023-04-15 DIAGNOSIS — Z8249 Family history of ischemic heart disease and other diseases of the circulatory system: Secondary | ICD-10-CM | POA: Diagnosis not present

## 2023-04-15 DIAGNOSIS — Z882 Allergy status to sulfonamides status: Secondary | ICD-10-CM | POA: Diagnosis not present

## 2023-04-15 DIAGNOSIS — E039 Hypothyroidism, unspecified: Secondary | ICD-10-CM | POA: Diagnosis present

## 2023-04-15 DIAGNOSIS — K5190 Ulcerative colitis, unspecified, without complications: Secondary | ICD-10-CM | POA: Diagnosis present

## 2023-04-15 DIAGNOSIS — R569 Unspecified convulsions: Secondary | ICD-10-CM | POA: Diagnosis not present

## 2023-04-15 DIAGNOSIS — I161 Hypertensive emergency: Secondary | ICD-10-CM | POA: Diagnosis present

## 2023-04-15 DIAGNOSIS — I110 Hypertensive heart disease with heart failure: Secondary | ICD-10-CM | POA: Diagnosis present

## 2023-04-15 DIAGNOSIS — R4701 Aphasia: Secondary | ICD-10-CM | POA: Diagnosis present

## 2023-04-15 DIAGNOSIS — Z79899 Other long term (current) drug therapy: Secondary | ICD-10-CM | POA: Diagnosis not present

## 2023-04-15 DIAGNOSIS — I10 Essential (primary) hypertension: Secondary | ICD-10-CM | POA: Diagnosis present

## 2023-04-15 DIAGNOSIS — R479 Unspecified speech disturbances: Secondary | ICD-10-CM | POA: Diagnosis not present

## 2023-04-15 DIAGNOSIS — N3000 Acute cystitis without hematuria: Secondary | ICD-10-CM | POA: Diagnosis not present

## 2023-04-15 DIAGNOSIS — G309 Alzheimer's disease, unspecified: Secondary | ICD-10-CM | POA: Diagnosis present

## 2023-04-15 DIAGNOSIS — E871 Hypo-osmolality and hyponatremia: Secondary | ICD-10-CM | POA: Diagnosis present

## 2023-04-15 DIAGNOSIS — K219 Gastro-esophageal reflux disease without esophagitis: Secondary | ICD-10-CM | POA: Diagnosis present

## 2023-04-15 LAB — CBC
HCT: 37.6 % (ref 36.0–46.0)
Hemoglobin: 12.5 g/dL (ref 12.0–15.0)
MCH: 32.8 pg (ref 26.0–34.0)
MCHC: 33.2 g/dL (ref 30.0–36.0)
MCV: 98.7 fL (ref 80.0–100.0)
Platelets: 193 10*3/uL (ref 150–400)
RBC: 3.81 MIL/uL — ABNORMAL LOW (ref 3.87–5.11)
RDW: 13.3 % (ref 11.5–15.5)
WBC: 5 10*3/uL (ref 4.0–10.5)
nRBC: 0 % (ref 0.0–0.2)

## 2023-04-15 LAB — BASIC METABOLIC PANEL
Anion gap: 8 (ref 5–15)
BUN: 12 mg/dL (ref 8–23)
CO2: 27 mmol/L (ref 22–32)
Calcium: 9.4 mg/dL (ref 8.9–10.3)
Chloride: 101 mmol/L (ref 98–111)
Creatinine, Ser: 0.69 mg/dL (ref 0.44–1.00)
GFR, Estimated: >60 mL/min (ref >60)
Glucose, Bld: 141 mg/dL — ABNORMAL HIGH (ref 70–99)
Potassium: 4 mmol/L (ref 3.5–5.1)
Sodium: 136 mmol/L (ref 135–145)

## 2023-04-15 LAB — URINE CULTURE

## 2023-04-15 LAB — GLUCOSE, CAPILLARY
Glucose-Capillary: 138 mg/dL — ABNORMAL HIGH (ref 70–99)
Glucose-Capillary: 153 mg/dL — ABNORMAL HIGH (ref 70–99)
Glucose-Capillary: 165 mg/dL — ABNORMAL HIGH (ref 70–99)
Glucose-Capillary: 211 mg/dL — ABNORMAL HIGH (ref 70–99)

## 2023-04-15 LAB — VITAMIN B12: Vitamin B-12: 441 pg/mL (ref 180–914)

## 2023-04-15 LAB — TSH: TSH: 3.3 u[IU]/mL (ref 0.350–4.500)

## 2023-04-15 LAB — FOLATE: Folate: 15.2 ng/mL (ref >5.9)

## 2023-04-15 NOTE — Progress Notes (Signed)
[facility]                                                                              Kristina [name], is a 77 y.o. female, DOB - 01/24/1946, ZOX:[id number] Admit date - 04/14/2023    Outpatient Primary MD for the patient is [name], [name], MD  LOS - 0  days  Chief Complaint  Patient presents with   Aphasia       Brief summary   Patient is a 77 year old female with history of hypertension, GERD, diabetes, hyponatremia presented with " word finding difficulty" since Friday, 3 days ago.  No other focal neurological deficits.  Per patient, in the last year she has had couple of episodes of word finding difficulty that seemed to be related to hyponatremia.  She was seen by neurology stroke service in 02/2023 when she was admitted with similar symptoms, underwent full stroke workup and was found to have hyponatremia.  She has a follow-up appointment with neurology outpatient on 06/10/2023. UA positive for UTI.  BP 193/119 on admission CBC, CMET unremarkable  Assessment & Plan    Principal Problem:   Dysarthria -patient admitted with word finding difficulty, otherwise no other focal neurological deficits.  Possibly due to accelerated hypertension and UTI.  Previous episodes were related to hyponatremia however sodium 136-139 on admission -UA positive for UTI, -CT head, MRI brain negative for any stroke, negative for headache, any focal FND's.  Patient had recent full stroke workup in 02/2023 -Continue to treat UTI, hypertension, obtain EEG -B12, folate, TSH normal, B1 pending -If no significant improvement despite treating above, will consult neurology. -SLP for speech and cognitive evaluation   Active Problems:   Hypothyroidism, adult -Continue Synthroid, TSH 3.3    UTI (urinary tract infection) -Follow urine culture and sensitivities, continue IV Rocephin  Accelerated hypertension -BP 193/119 on admission, continue Bystolic, Avapro, BP now  improving -Hydralazine IV as needed with parameters  Cognitive impairment -SLP evaluation for speech and cognitive evaluation -Stable off Aricept recently and was prescribed Exelon, as recommended by her neurologist, Dr. [name].  She has not started it yet.   Placed on Exelon 1.5 mg twice daily     Dyslipidemia Continue simvastatin   Estimated body mass index is 25.09 kg/m as calculated from the following:   Height as of this encounter: 5\' 4"  (1.626 m).   Weight as of this encounter: 66.3 kg.  Code Status: Full code DVT Prophylaxis:  enoxaparin (LOVENOX) injection 40 mg Start: 04/14/23 2200 SCDs Start: 04/14/23 1620   Level of Care: Level of care: Med-Surg Family Communication: Updated patient Disposition Plan:      Remains inpatient appropriate: SLP, EEG pending today, PT OT evaluation, possible DC tomorrow if stable and improving   Procedures:    Consultants:     Antimicrobials:   Anti-infectives (From admission, onward)    Start     Dose/Rate Route Frequency Ordered Stop   04/15/23 1700  cefTRIAXone (ROCEPHIN) 1 g in sodium chloride 0.9 % 100 mL IVPB        1 g 200 mL/hr over 30 Minutes Intravenous Every 24 hours 04/14/23 1617  04/14/23 1600  cefTRIAXone (ROCEPHIN) 1 g in sodium chloride 0.9 % 100 mL IVPB        1 g 200 mL/hr over 30 Minutes Intravenous  Once 04/14/23 1552 04/14/23 1655          Medications  enoxaparin (LOVENOX) injection  40 mg Subcutaneous Q24H   insulin aspart  0-15 Units Subcutaneous TID WC   insulin aspart  0-5 Units Subcutaneous QHS   irbesartan  150 mg Oral Daily   levothyroxine  50 mcg Oral Q0600   nebivolol  5 mg Oral QHS   rivastigmine  1.5 mg Oral BID   simvastatin  20 mg Oral QHS      Subjective:   Kristina [name] was seen and examined today.  Still having some stuttering and word finding difficulty.  No dizziness, lightheadedness, no focal weakness.  No headache or blurred vision. Objective:   Vitals:   04/14/23  1723 04/14/23 2131 04/15/23 0106 04/15/23 0506  BP: (!) 187/73 (!) 148/63 123/66 (!) 122/58  Pulse: 91 78 72 73  Resp: 17 16 16 16   Temp: 98.1 F (36.7 C) 98 F (36.7 C) 98.5 F (36.9 C) 98.4 F (36.9 C)  TempSrc: Oral  Oral Oral  SpO2: 98% 96% 96% 96%  Weight: 66.3 kg     Height: 5\' 4"  (1.626 m)       Intake/Output Summary (Last 24 hours) at 04/15/2023 1023 Last data filed at 04/14/2023 1838 Gross per 24 hour  Intake 221.46 ml  Output --  Net 221.46 ml     Wt Readings from Last 3 Encounters:  04/14/23 66.3 kg  03/07/23 67.1 kg  12/06/22 68.5 kg     Exam General: Alert and oriented x 3, NAD Cardiovascular: S1 S2 auscultated,  RRR Respiratory: Clear to auscultation bilaterally, no wheezing Gastrointestinal: Soft, nontender, nondistended, + bowel sounds Ext: no pedal edema bilaterally Neuro: Strength 5/5 upper and lower extremities bilaterally Skin: No rashes Psych: Normal affect     Data Reviewed:  I have personally reviewed following labs    CBC Lab Results  Component Value Date   WBC 5.0 04/15/2023   RBC 3.81 (L) 04/15/2023   HGB 12.5 04/15/2023   HCT 37.6 04/15/2023   MCV 98.7 04/15/2023   MCH 32.8 04/15/2023   PLT 193 04/15/2023   MCHC 33.2 04/15/2023   RDW 13.3 04/15/2023   LYMPHSABS 0.8 04/14/2023   MONOABS 0.3 04/14/2023   EOSABS 0.1 04/14/2023   BASOSABS 0.0 04/14/2023     Last metabolic panel Lab Results  Component Value Date   NA 136 04/15/2023   K 4.0 04/15/2023   CL 101 04/15/2023   CO2 27 04/15/2023   BUN 12 04/15/2023   CREATININE 0.69 04/15/2023   GLUCOSE 141 (H) 04/15/2023   GFRNONAA >60 04/15/2023   GFRAA 76 03/22/2020   CALCIUM 9.4 04/15/2023   PROT 7.2 04/14/2023   ALBUMIN 4.2 04/14/2023   BILITOT 0.8 04/14/2023   ALKPHOS 65 04/14/2023   AST 15 04/14/2023   ALT 11 04/14/2023   ANIONGAP 8 04/15/2023    CBG (last 3)  Recent Labs    04/14/23 1722 04/14/23 2131 04/15/23 0802  GLUCAP 143* 152* 138*       Coagulation Profile: Recent Labs  Lab 04/14/23 1248  INR 1.1     Radiology Studies: I have personally reviewed the imaging studies  MR BRAIN WO CONTRAST  Result Date: 04/14/2023 CLINICAL DATA:  Aphasia EXAM: MRI HEAD WITHOUT CONTRAST TECHNIQUE: Multiplanar, multiecho  pulse sequences of the brain and surrounding structures were obtained without intravenous contrast. COMPARISON:  same-day CT head, brain MRI 03/07/2023 FINDINGS: Brain: There is no acute intracranial hemorrhage, extra-axial fluid collection, or acute infarct. Parenchymal volume is stable. The ventricles are stable in size. 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 The pituitary and suprasellar region are normal. There is no mass lesion. There is no mass effect or midline shift. Vascular: Normal flow voids. Skull and upper cervical spine: Normal marrow signal. Sinuses/Orbits: The paranasal sinuses are clear. Bilateral lens implants are in place. The globes and orbits are otherwise unremarkable. Other: The mastoid air cells and middle ear cavities are clear. IMPRESSION: Stable brain MRI with no acute intracranial pathology. Electronically Signed   By: Lesia Hausen M.D.   On: 04/14/2023 15:44   CT HEAD WO CONTRAST  Result Date: 04/14/2023 CLINICAL DATA:  Cognitive issues. Difficult to to comprehend words. Concern for stroke occurring yesterday. EXAM: CT HEAD WITHOUT CONTRAST TECHNIQUE: Contiguous axial images were obtained from the base of the skull through the vertex without intravenous contrast. RADIATION DOSE REDUCTION: This exam was performed according to the departmental dose-optimization program which includes automated exposure control, adjustment of the mA and/or kV according to patient size and/or use of iterative reconstruction technique. COMPARISON:  CTA head and neck and MRI of the head dated 03/07/2023. FINDINGS: Brain: No evidence of acute  infarction, hemorrhage, hydrocephalus, extra-axial collection or mass lesion/mass effect. White matter hypoattenuation is noted consistent with chronic microvascular ischemic change, stable from the prior exams. Vascular: No hyperdense vessel or unexpected calcification. Skull: Normal. Negative for fracture or focal lesion. Sinuses/Orbits: Globes and orbits are unremarkable. Sinuses are clear. Other: None. IMPRESSION: 1. No acute intracranial abnormalities. 2. No change from the prior studies. Electronically Signed   By: Amie Portland M.D.   On: 04/14/2023 12:04       Ephriam Turman M.D. [facility] 04/15/2023, 10:23 AM  Available via Epic secure chat 7am-7pm After 7 pm, please refer to night coverage provider listed on amion.

## 2023-04-15 NOTE — Procedures (Signed)
Patient Name: Kristina [name]  MRN: [id number]  Epilepsy Attending: Charlsie Quest  Referring Physician/Provider: Cathren Harsh, MD  Date: 04/15/2023 Duration: 26.14 mins  Patient history: 77yo F with word finding difficulty getting eeg to evaluate for seizure  Level of alertness: Awake  AEDs during EEG study: None  Technical aspects: This EEG study was done with scalp electrodes positioned according to the 10-20 International system of electrode placement. Electrical activity was reviewed with band pass filter of 1-70Hz , sensitivity of 7 uV/mm, display speed of 38mm/sec with a 60Hz  notched filter applied as appropriate. EEG data were recorded continuously and digitally stored.  Video monitoring was available and reviewed as appropriate.  Description: The posterior dominant rhythm consists of 6 Hz activity of moderate voltage (25-35 uV) seen predominantly in posterior head regions, symmetric and reactive to eye opening and eye closing. EEG showed continuous generalized predominantly 5 to 7 Hz theta slowing admixed with rare 2-3hz  delta slowing. Physiologic photic driving was not seen during photic stimulation.  Hyperventilation was not performed.     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 No seizures or epileptiform discharges were seen throughout the recording.  Marieanne Marxen Annabelle Harman

## 2023-04-15 NOTE — Progress Notes (Signed)
   04/15/23 1417  TOC Brief Assessment  Insurance and Status Reviewed  Patient has primary care physician Yes  Home environment has been reviewed Home  Prior level of function: Independent  Prior/Current Home Services No current home services  Social Determinants of Health Reivew SDOH reviewed no interventions necessary  Readmission risk has been reviewed Yes  Transition of care needs no transition of care needs at this time

## 2023-04-15 NOTE — Consult Note (Signed)
Neurology Consultation Reason for Consult: Difficulty speaking Referring Physician: Rai, R  CC: Difficulty speaking  History is obtained from: Patient  HPI: Kristina [name] is a 77 y.o. female with a history of hypertension, hyperlipidemia, diabetes, MCI with ATN consistent with Alzheimer's, who presents with difficulty speaking in the setting of urinary tract infection.  She has had multiple presentations with difficulty speaking over the past year, always associated with some type of physiological stressors such as hyponatremia or now a UTI.  I noted significant problems with attention when I saw her in July 2023 as well, but noted that though she could speak relatively fluently, she had difficulty with repeating longer phrases and would frequently lose her train of thought.  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  She denies any trouble with understanding, but rather just with coming up with her words.  She has also noticed that she has been slightly more unsteady than typical.  She denies any visual change, numbness, weakness, or other symptoms.  Past Medical History:  Diagnosis Date   Anxiety    Asthma    Diabetes (HCC)    Diabetes mellitus without complication (HCC)    Diverticulitis    GERD (gastroesophageal reflux disease)    HTN (hypertension)    Hypercholesteremia    Hyperlipidemia    Hypertension    Obesity    Rosacea    Thyroid disease    Ulcerative colitis (HCC)      Family History  Problem Relation Age of Onset   Hypertension Mother    Heart failure Mother    Heart attack Mother    Hypotension Father    Dementia Father      Social History:  reports that she has never smoked. She has never used smokeless tobacco. She reports that she does not drink alcohol and does not use drugs.   Exam: Current vital signs: BP (!) 149/65 (BP Location: Left Arm)   Pulse 74   Temp 98.4 F (36.9 C)  (Oral)   Resp 18   Ht 5\' 4"  (1.626 m)   Wt 66.3 kg   SpO2 98%   BMI 25.09 kg/m  Vital signs in last 24 hours: Temp:  [98 F (36.7 C)-98.5 F (36.9 C)] 98.4 F (36.9 C) (09/16 1400) Pulse Rate:  [72-78] 74 (09/16 1400) Resp:  [16-18] 18 (09/16 1400) BP: (122-149)/(58-66) 149/65 (09/16 1400) SpO2:  [96 %-98 %] 98 % (09/16 1400)   Physical Exam  Appears well-developed and well-nourished.   Neuro: Mental Status: Patient is awake, alert, oriented to person, place, gives the month is October, oriented to year. She has frequent difficulty with coming up with specific words, though when I asked her to name things she is able to do so without difficulty.  With repetition, she is able to repeat simple phrases, but with longer phrases she is able to get the first part, but loses her ability as she goes.  She is unable to perform serial sevens or give the number of quarters in $2.75 Cranial Nerves: II: Visual Fields are full. Pupils are equal, round, and reactive to light.   III,IV, VI: EOMI without ptosis or diploplia.  V: Facial sensation is symmetric to temperature VII: Facial movement is symmetric.  VIII: hearing is intact to voice X: Uvula elevates symmetrically XI: Shoulder shrug is symmetric. XII: tongue is midline without atrophy or fasciculations.  Motor: Tone is normal. Bulk is normal. 5/5  strength was present in all four extremities.  Sensory: Sensation is symmetric to light touch and temperature in the arms and legs. Cerebellar: No clear ataxia     I have reviewed labs in epic and the results pertinent to this consultation are: UA-suspicious for UTI  I have reviewed the images obtained: MRI brain-negative  Impression: 77 year old female with MCI with Alzheimer's type ATN profile who has presented multiple times over the past year with difficulty speaking in the setting of acute physiological stressors.  My suspicion is that she likely has some underlying language  difficulty secondary to her MCI which becomes more apparent during times of physiological stress.  I would expect this to continue to be an issue with any episodes of sickness, delirium, or even possibly fatigue in the future.  EEG is pending, and reasonable but I doubt this will give any further answers given it has been negative multiple times in the past.  Recommendations: 1) follow-up EEG results 2) treat underlying UTI 3) PT, ST 4) neurology will be available as needed.   Ritta Slot, MD [facility] [phone]  If 7pm- 7am, please page neurology on call as listed in AMION.

## 2023-04-15 NOTE — Progress Notes (Addendum)
Overnight   NAME: Kristina [name] MRN: [id number] DOB : 04/02/1946    Date of Service   04/15/2023   HPI/Events of Note    Notified by RN for possible fall in room - unwitnessed.  77 year old female with medical history hypertension, GERD, diabetes, hyponatremia admitted through the ER with word finding difficulty.  History provided by the patient along with her sister. Earlier in the year she was seen by neurology and diagnosed with mild cognitive impairment and started on Aricept. 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  RN was alerted by telemetry who noticed artifact/leads off type reading. RN went to the room found patient on her knees in no obvious distress.  Patient was assisted back to the bed without incident.  On bedside visit patient was awake and oriented, conversational pleasant having no problem with communication and occasionally making jokes.  On assessment patient has no obvious or stated: Deformity, contusion, abrasion, punctures, bruises, tears, lacerations or swelling. Movement of extremities/strength x 4 is intact, equal bilaterally.  She states no injury or pain anywhere.  Merely states embarrassment following situation. She has recall of situation  She states forward onto her knees and rested on her hands only. She states no pain of any kind.  She is not on chronic anticoagulation.   Lovenox inpatient for DVT prophylaxis.    Interventions/ Plan   Neurocheck every 4 hrs x 2 Bed alarm Fall arresting pads      Chinita Greenland BSN MSNA MSN ACNPC-AG Acute Care Nurse Practitioner [facility]

## 2023-04-15 NOTE — Plan of Care (Signed)
Pt is progressing 

## 2023-04-15 NOTE — Progress Notes (Signed)
EEG complete - results pending 

## 2023-04-15 NOTE — Plan of Care (Signed)
  Problem: Education: Goal: Knowledge of disease or condition will improve Outcome: Progressing Goal: Knowledge of secondary prevention will improve (MUST DOCUMENT ALL) Outcome: Progressing Goal: Knowledge of patient specific risk factors will improve Loraine Leriche N/A or DELETE if not current risk factor) Outcome: Progressing   Problem: Ischemic Stroke/TIA Tissue Perfusion: Goal: Complications of ischemic stroke/TIA will be minimized Outcome: Progressing   Problem: Coping: Goal: Will verbalize positive feelings about self Outcome: Progressing Goal: Will identify appropriate support needs Outcome: Progressing   Problem: Health Behavior/Discharge Planning: Goal: Ability to manage health-related needs will improve Outcome: Progressing Goal: Goals will be collaboratively established with patient/family Outcome: Progressing   Problem: Self-Care: Goal: Ability to participate in self-care as condition permits will improve Outcome: Progressing Goal: Verbalization of feelings and concerns over difficulty with self-care will improve Outcome: Progressing Goal: Ability to communicate needs accurately will improve Outcome: Progressing   Problem: Nutrition: Goal: Risk of aspiration will decrease Outcome: Progressing Goal: Dietary intake will improve Outcome: Progressing   Problem: Clinical Measurements: Goal: Ability to maintain clinical measurements within normal limits will improve Outcome: Progressing Goal: Will remain free from infection Outcome: Progressing Goal: Diagnostic test results will improve Outcome: Progressing Goal: Respiratory complications will improve Outcome: Progressing Goal: Cardiovascular complication will be avoided Outcome: Progressing   Problem: Activity: Goal: Risk for activity intolerance will decrease Outcome: Progressing   Problem: Nutrition: Goal: Adequate nutrition will be maintained Outcome: Progressing

## 2023-04-15 NOTE — Progress Notes (Signed)
   04/15/23 0247  What Happened  Was fall witnessed? No  Was patient injured? No  Patient found on floor  Found by Staff-comment  Stated prior activity ambulating-unassisted  Provider Notification  Provider Name/Title J. Garner Nash  Date Provider Notified 04/15/23  Time Provider Notified 0254  Method of Notification Page  Notification Reason Fall  Provider response En route  Date of Provider Response 04/15/23  Time of Provider Response 0308  Adult Fall Risk Assessment  Risk Factor Category (scoring not indicated) High fall risk per protocol (document High fall risk);Fall has occurred during this admission (document High fall risk)  Patient Fall Risk Level High fall risk  Adult Fall Risk Interventions  Required Bundle Interventions *See Row Information* High fall risk - low, moderate, and high requirements implemented  Integumentary  Integumentary (WDL) X  Skin Color Appropriate for ethnicity  Skin Condition Dry  Skin Integrity Scratch Marks  Scratch Marks Location Back  Scratch Marks Location Orientation Upper;Left  Scratch Marks Intervention Cleansed  Skin Turgor Non-tenting   This RN informed by tele leads unplugged, went to check on pt. Pt found on her knees and arms on chair facing downward. This RN asked pt if she was okay and what happened. Pt states, " I kinda fell can you help me up please? This RN asked for help from NT and other RN right outside of pt room.  Assessed pt for head injury, LOC. All WDL. Helped pt up back into bed. And again checked for injuries and clarification on what happened. Pt stated" I got up to get something from over there and think I got caught up on the socks and fell to my knees kinda slow and crawled to the chair then you came in".. denies hitting head or blackout. A little redness on knees and scratch on back. Pt denies any pain. Consulting civil engineer and on Call j. Garner Nash notified. Garner Nash at bedside. See new orders. Continue to monitor reminded Pt to call for  help when wanting to get up. Bed alarm on bed in lowest position door left open.

## 2023-04-16 DIAGNOSIS — I160 Hypertensive urgency: Secondary | ICD-10-CM | POA: Diagnosis not present

## 2023-04-16 DIAGNOSIS — R471 Dysarthria and anarthria: Secondary | ICD-10-CM

## 2023-04-16 DIAGNOSIS — R479 Unspecified speech disturbances: Secondary | ICD-10-CM | POA: Diagnosis not present

## 2023-04-16 DIAGNOSIS — N3000 Acute cystitis without hematuria: Secondary | ICD-10-CM | POA: Diagnosis not present

## 2023-04-16 LAB — CBC
HCT: 37.1 % (ref 36.0–46.0)
Hemoglobin: 12.5 g/dL (ref 12.0–15.0)
MCH: 32.5 pg (ref 26.0–34.0)
MCHC: 33.7 g/dL (ref 30.0–36.0)
MCV: 96.4 fL (ref 80.0–100.0)
Platelets: 180 10*3/uL (ref 150–400)
RBC: 3.85 MIL/uL — ABNORMAL LOW (ref 3.87–5.11)
RDW: 13.2 % (ref 11.5–15.5)
WBC: 5.3 10*3/uL (ref 4.0–10.5)
nRBC: 0 % (ref 0.0–0.2)

## 2023-04-16 LAB — BASIC METABOLIC PANEL
Anion gap: 9 (ref 5–15)
BUN: 14 mg/dL (ref 8–23)
CO2: 26 mmol/L (ref 22–32)
Calcium: 9.4 mg/dL (ref 8.9–10.3)
Chloride: 98 mmol/L (ref 98–111)
Creatinine, Ser: 0.71 mg/dL (ref 0.44–1.00)
GFR, Estimated: >60 mL/min (ref >60)
Glucose, Bld: 159 mg/dL — ABNORMAL HIGH (ref 70–99)
Potassium: 4.2 mmol/L (ref 3.5–5.1)
Sodium: 133 mmol/L — ABNORMAL LOW (ref 135–145)

## 2023-04-16 LAB — GLUCOSE, CAPILLARY
Glucose-Capillary: 160 mg/dL — ABNORMAL HIGH (ref 70–99)
Glucose-Capillary: 149 mg/dL — ABNORMAL HIGH (ref 70–99)
Glucose-Capillary: 151 mg/dL — ABNORMAL HIGH (ref 70–99)
Glucose-Capillary: 104 mg/dL — ABNORMAL HIGH (ref 70–99)

## 2023-04-16 NOTE — [place] FL2 (Signed)
[place] MEDICAID FL2 LEVEL OF CARE FORM     IDENTIFICATION  Patient Name: Kristina [name] Birthdate: 09-16-1945 Sex: female Admission Date (Current Location): 04/14/2023  [facility] and [place] Number:  Producer, television/film/video and Address:  [facility],  [address] [place], [place] [address]      Provider Number: [id number]  Attending Physician Name and Address:  Cathren Harsh, MD  Relative Name and Phone Number:       Current Level of Care: Hospital Recommended Level of Care:   Prior Approval Number:    Date Approved/Denied:   PASRR Number: Pending  Discharge Plan: SNF    Current Diagnoses: Patient Active Problem List   Diagnosis Date Noted   Dysarthria 04/15/2023   Accelerated hypertension 04/15/2023   Aphasia 03/07/2023   TIA (transient ischemic attack) 03/07/2023   Weakness 03/07/2023   Nausea and vomiting 03/07/2023   Overweight (BMI 25.0-29.9) 03/07/2023   Headache 02/23/2022   Chronic diastolic CHF (congestive heart failure) (HCC) 02/23/2022   HTN (hypertension), malignant 02/22/2022   GERD (gastroesophageal reflux disease)    Chest pain    Hypercholesteremia 10/15/2018   GIB (gastrointestinal bleeding) 10/15/2018   Colitis 10/15/2018   Hypokalemia    Thyroid disease    Acute pyelonephritis 03/22/2018   Pyelonephritis    UTI (urinary tract infection) 03/21/2018   IBS (irritable bowel syndrome) 03/21/2018   Hypertensive disorder 02/12/2018   Dyslipidemia 02/12/2018   Pain in right knee 01/09/2018   Morbid obesity (HCC) 08/08/2015   Hyponatremia 05/01/2015   Gastroenteritis, acute 05/01/2015   Hypothyroidism 05/01/2015   Asthma in remission 05/01/2015   Diarrhea    Cough variant asthma 04/19/2015   Acute bronchitis 03/08/2015   Hypothyroidism, adult 02/19/2015   Dyspnea 02/18/2015   Upper airway cough syndrome 01/07/2015   Pre-syncope 12/08/2013   PVC (premature ventricular contraction) 08/26/2013   Rosacea    Anxiety     Diverticulitis    Controlled type 2 diabetes mellitus without complication, without long-term current use of insulin (HCC)    Obesity     Orientation RESPIRATION BLADDER Height & Weight     Self, Time, Situation, Place  Normal Continent Weight: 146 lb 2.6 oz (66.3 kg) Height:  5\' 4"  (162.6 cm)  BEHAVIORAL SYMPTOMS/MOOD NEUROLOGICAL BOWEL NUTRITION STATUS      Continent Diet (Regular)  AMBULATORY STATUS COMMUNICATION OF NEEDS Skin   Limited Assist Verbally Normal                       Personal Care Assistance Level of Assistance  Bathing, Feeding, Dressing Bathing Assistance: Limited assistance Feeding assistance: Independent Dressing Assistance: Limited assistance     Functional Limitations Info  Sight, Hearing, Speech Sight Info: Impaired Hearing Info: Adequate Speech Info: Adequate    SPECIAL CARE FACTORS FREQUENCY  PT (By licensed PT), OT (By licensed OT)     PT Frequency: 5x/wk OT Frequency: 5x/wk            Contractures Contractures Info: Not present    Additional Factors Info  Code Status, Allergies Code Status Info: FULL Allergies Info: Ace Inhibitors, Cipro (Ciprofloxacin Hcl), Vibra-tab (Doxycycline), Shellfish Allergy           Current Medications (04/16/2023):  This is the current hospital active medication list Current Facility-Administered Medications  Medication Dose Route Frequency Provider Last Rate Last Admin   acetaminophen (TYLENOL) tablet 650 mg  650 mg Oral Q6H PRN Maryln Gottron, MD  Or   acetaminophen (TYLENOL) suppository 650 mg  650 mg Rectal Q6H PRN Kirby Crigler, Mir M, MD       albuterol (PROVENTIL) (2.5 MG/3ML) 0.083% nebulizer solution 2.5 mg  2.5 mg Nebulization Q2H PRN Kirby Crigler, Mir M, MD       cefTRIAXone (ROCEPHIN) 1 g in sodium chloride 0.9 % 100 mL IVPB  1 g Intravenous Q24H Kirby Crigler, Mir M, MD 200 mL/hr at 04/15/23 1718 1 g at 04/15/23 1718   enoxaparin (LOVENOX) injection 40 mg  40 mg Subcutaneous Q24H  Kirby Crigler, Mir M, MD   40 mg at 04/15/23 2233   hydrALAZINE (APRESOLINE) injection 10 mg  10 mg Intravenous Q6H PRN Kirby Crigler, Mir M, MD       insulin aspart (novoLOG) injection 0-15 Units  0-15 Units Subcutaneous TID WC Kirby Crigler, Mir M, MD   2 Units at 04/16/23 1356   insulin aspart (novoLOG) injection 0-5 Units  0-5 Units Subcutaneous QHS Kirby Crigler, Mir M, MD   2 Units at 04/15/23 2238   irbesartan (AVAPRO) tablet 150 mg  150 mg Oral Daily Kirby Crigler, Mir M, MD   150 mg at 04/16/23 0912   levothyroxine (SYNTHROID) tablet 50 mcg  50 mcg Oral Q0600 Maryln Gottron, MD   50 mcg at 04/16/23 0603   nebivolol (BYSTOLIC) tablet 5 mg  5 mg Oral QHS Kirby Crigler, Mir M, MD   5 mg at 04/15/23 2232   ondansetron (ZOFRAN) tablet 4 mg  4 mg Oral Q6H PRN Kirby Crigler, Mir M, MD       Or   ondansetron [facility]) injection 4 mg  4 mg Intravenous Q6H PRN Kirby Crigler, Mir M, MD       rivastigmine (EXELON) capsule 1.5 mg  1.5 mg Oral BID Kirby Crigler, Mir M, MD   1.5 mg at 04/16/23 0912   simvastatin (ZOCOR) tablet 20 mg  20 mg Oral QHS Kirby Crigler, Mir M, MD   20 mg at 04/15/23 2233   traZODone (DESYREL) tablet 25 mg  25 mg Oral QHS PRN Maryln Gottron, MD   25 mg at 04/14/23 2157     Discharge Medications: Please see discharge summary for a list of discharge medications.  Relevant Imaging Results:  Relevant Lab Results:   Additional Information SSN: [id number]  Otelia Santee, LCSW

## 2023-04-16 NOTE — Evaluation (Signed)
Speech Language Pathology Evaluation Patient Details Name: Kristina [name] MRN: [id number] DOB: 1946/06/24 Today's Date: 04/16/2023 Time: 1140-1240 SLP Time Calculation (min) (ACUTE ONLY): 60 min  Problem List:  Patient Active Problem List   Diagnosis Date Noted   Dysarthria 04/15/2023   Accelerated hypertension 04/15/2023   Aphasia 03/07/2023   TIA (transient ischemic attack) 03/07/2023   Weakness 03/07/2023   Nausea and vomiting 03/07/2023   Overweight (BMI 25.0-29.9) 03/07/2023   Headache 02/23/2022   Chronic diastolic CHF (congestive heart failure) (HCC) 02/23/2022   HTN (hypertension), malignant 02/22/2022   GERD (gastroesophageal reflux disease)    Chest pain    Hypercholesteremia 10/15/2018   GIB (gastrointestinal bleeding) 10/15/2018   Colitis 10/15/2018   Hypokalemia    Thyroid disease    Acute pyelonephritis 03/22/2018   Pyelonephritis    UTI (urinary tract infection) 03/21/2018   IBS (irritable bowel syndrome) 03/21/2018   Hypertensive disorder 02/12/2018   Dyslipidemia 02/12/2018   Pain in right knee 01/09/2018   Morbid obesity (HCC) 08/08/2015   Hyponatremia 05/01/2015   Gastroenteritis, acute 05/01/2015   Hypothyroidism 05/01/2015   Asthma in remission 05/01/2015   Diarrhea    Cough variant asthma 04/19/2015   Acute bronchitis 03/08/2015   Hypothyroidism, adult 02/19/2015   Dyspnea 02/18/2015   Upper airway cough syndrome 01/07/2015   Pre-syncope 12/08/2013   PVC (premature ventricular contraction) 08/26/2013   Rosacea    Anxiety    Diverticulitis    Controlled type 2 diabetes mellitus without complication, without long-term current use of insulin (HCC)    Obesity    Past Medical History:  Past Medical History:  Diagnosis Date   Anxiety    Asthma    Diabetes (HCC)    Diabetes mellitus without complication (HCC)    Diverticulitis    GERD (gastroesophageal reflux disease)    HTN (hypertension)    Hypercholesteremia    Hyperlipidemia     Hypertension    Obesity    Rosacea    Thyroid disease    Ulcerative colitis (HCC)    Past Surgical History:  Past Surgical History:  Procedure Laterality Date   ACHILLES TENDON REPAIR     APPENDECTOMY     BIOPSY  10/16/2018   Procedure: BIOPSY;  Surgeon: Kathi Der, MD;  Location: [facility];  Service: Gastroenterology;;   CESAREAN SECTION     CHOLECYSTECTOMY     FLEXIBLE SIGMOIDOSCOPY N/A 10/16/2018   Procedure: FLEXIBLE SIGMOIDOSCOPY;  Surgeon: Kathi Der, MD;  Location: [facility];  Service: Gastroenterology;  Laterality: N/A;   hammertoe     HPI:  pt is a 77 yo female adm to [facility] with 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 Pt with PMH + for Mild cognitive impairment and was started on  Aricept - but changed to Exelon due to pt having difficulties sleeping.  EEG showed pt with diffuse encephalopathy, and she was found to have a UTI.  Pt also has h/o GERD, DM, recent episode of hyponatremia.  Speech eval ordered.  Brain imaging negative.   Assessment / Plan / Recommendation Clinical Impression  SLP administered Western aphasia battery bedside form with patient scoring as listed 6 of 10 for content score 5 out of 10 fluency score 9 of 10 auditory verbal comprehension score 8 of 10 sequential commands score 9 of 10 repetitions score 10 of 10 object naming score 6 of 10 for writing score.  Her bedside aphasia score was 70.  She demonstrates significant expressive more than receptive language difficulties resulting in frequentt fillers, pauses and redirections during sentences due to significant word finding deficits.  This inhibits her ability to communicate about her basic needs and results in severe disfluency.  Written example included only content words without articles thus she did not complete an entire sentence and she demonstrated decreased awareness to this.  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 Patient's impaired attention also impacts her ability to follow more complex directions and participate in higher level conversation.   Given her lack of awareness, judgement for safety to her fall risk and need for ADL, home management assist, this appears related to her cognitive impairment diagnosis.  Also question if her baseline MCI could be exacerbated given her UTI and new environment.  She would benefit from SLP follow-up to help establish routine and reinforcing as much independence as possible during her ADLs and for daily responsibilities including medications, finances and appointments etc.  Informed patient and her sister to recommendation for her to have someone assist her with all of her aforementioned tasks.    SLP Assessment  SLP Recommendation/Assessment: All further Speech Lanaguage Pathology  needs can be addressed in the next venue of care SLP Visit Diagnosis: Cognitive communication deficit (R41.841)    Recommendations for follow up therapy are one component of a multi-disciplinary discharge planning process, led by the attending physician.  Recommendations may be updated based on patient status, additional functional criteria and insurance authorization.    Follow Up Recommendations  Follow physician's recommendations for discharge plan and follow up therapies    Assistance Recommended at Discharge     Functional Status Assessment Patient has had a recent decline in their functional status and/or demonstrates limited ability to make significant improvements in function in a reasonable and predictable amount of time  Frequency and Duration           SLP Evaluation Cognition  Overall Cognitive Status: Impaired/Different from baseline Arousal/Alertness: Awake/alert Memory: Impaired Memory Impairment: Other (comment);Storage deficit (Patient.did not recall informing SLP that she has carpal tunnel earlier in the  session repeating it twice) Awareness: Impaired (Decreased awareness to her risk of fall after being found on the floor in the hospital twice) Awareness Impairment: Intellectual impairment Problem Solving: Impaired Problem Solving Impairment: Functional basic (Patient is not calling for assistance) Safety/Judgment: Impaired (Patient expresses lack of concern for fall risk) Comments: Patient did not recall SLP requesting her to write her name and her address as she wrote her name only.       Comprehension  Auditory Comprehension Overall Auditory Comprehension: Impaired Yes/No Questions: Impaired Basic Biographical Questions: 76-100% accurate Basic Immediate Environment Questions: 75-100% accurate Complex Questions: 50-74% accurate Commands: Impaired One Step Basic Commands: 75-100% accurate Two Step Basic Commands: 75-100% accurate Multistep Basic Commands: 50-74% accurate Conversation: Simple Interfering Components: Attention;Processing speed;Working Radio broadcast assistant: Music therapist Reading Comprehension Reading Status: Not tested    Expression Expression Primary Mode of Expression: Verbal Verbal Expression Overall Verbal Expression: Impaired Initiation: Impaired Level of Generative/Spontaneous Verbalization: Sentence;Phrase Repetition: Impaired Level of Impairment: Sentence level (Patient had difficulty repeating a lengthy sentence) Naming: Impairment (10/10 basic items named correctly 1 with delay) Confrontation: Within functional limits Pragmatics: No impairment Interfering Components: Attention Non-Verbal Means of Communication: Not applicable Other Verbal Expression Comments: Patient is demonstrating significant expressive language deficits including frequent fillers, pauses and redirection during statements.  She attempted to compensate for word finding difficulties by  these a forementioned strategies.  During Western aphasia battery bedside  form she was able to name all of the objects but writing at a paragraph level showed lack of article use and only content words without proper grammar. Written Expression Dominant Hand: Left Written Expression: Exceptions to [facility] Self Formulation Ability: Phrase (Patient demonstrates use of content words only and absence of all articles while writing a brief paragraph about a picture.)   Oral / Motor  Oral Motor/Sensory Function Overall Oral Motor/Sensory Function: Within functional limits Motor Speech Overall Motor Speech: Appears within functional limits for tasks assessed Respiration: Within functional limits Phonation: Normal Resonance: Within functional limits Articulation: Within functional limitis Intelligibility: Intelligible Motor Planning: Witnin functional limits            Kristina [name] 04/16/2023, 2:25 PM Kristina Infante, MS [facility] SLP Acute Rehab Services Office [phone]

## 2023-04-16 NOTE — Progress Notes (Signed)
[facility]                                                                              [name], is a 77 y.o. female, DOB - 04-30-1946, NFA:[id number] Admit date - 04/14/2023    Outpatient Primary MD for the patient is [name], [name], MD  LOS - 1  days  Chief Complaint  Patient presents with   Aphasia       Brief summary   Patient is a 77 year old female with history of hypertension, GERD, diabetes, hyponatremia presented with " word finding difficulty" since Friday, 3 days ago.  No other focal neurological deficits.  Per patient, in the last year she has had couple of episodes of word finding difficulty that seemed to be related to hyponatremia.  She was seen by neurology stroke service in 02/2023 when she was admitted with similar symptoms, underwent full stroke workup and was found to have hyponatremia.  She has a follow-up appointment with neurology outpatient on 06/10/2023. UA positive for UTI.  BP 193/119 on admission CBC, CMET unremarkable  Assessment & Plan    Principal Problem:   Dysarthria -patient admitted with word finding difficulty, otherwise no other focal neurological deficits.  Possibly due to accelerated hypertension and UTI.  Previous episodes were related to hyponatremia however sodium 136-139 on admission -UA positive for UTI, -CT head, MRI brain negative for any stroke, negative for headache, any focal FND's.  Patient had recent full stroke workup in 02/2023 -Continue to treat UTI, hypertension, EEG negative for any epileptiform discharges. -B12, folate, TSH normal, B1 pending -SLP for speech and cognitive evaluation -Neurology consulted, seen by Dr. 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  Outpatient follow-up with neurology (Dr [name]) -Lives alone, PT evaluation done, recommended SNF   Active Problems:   Hypothyroidism, adult -Continue Synthroid, TSH  3.3    UTI (urinary tract infection) Urine culture showed multiple species Continue IV Rocephin x 3 days  Accelerated hypertension -BP 193/119 on admission,  -Continue Bystolic, Avapro  -BP now improving, continue IV hydralazine as needed with parameters  Cognitive impairment -SLP evaluation for speech and cognitive evaluation -Stable off Aricept recently and was prescribed Exelon, as recommended by her neurologist, Dr. [name].  She has not started it yet.   Placed on Exelon 1.5 mg twice daily     Dyslipidemia Continue simvastatin   Estimated body mass index is 25.09 kg/m as calculated from the following:   Height as of this encounter: 5\' 4"  (1.626 m).   Weight as of this encounter: 66.3 kg.  Code Status: Full code DVT Prophylaxis:  enoxaparin (LOVENOX) injection 40 mg Start: 04/14/23 2200 SCDs Start: 04/14/23 1620   Level of Care: Level of care: Med-Surg Family Communication: Updated patient's sister today, requested PT evaluation, lives at home alone and does not feel patient is safe for discharge home  Disposition Plan:      Remains inpatient appropriate PT evaluation done, recommended SNF  Procedures:  EEG  Consultants:   Neuro   Antimicrobials:   Anti-infectives (From admission, onward)    Start  Dose/Rate Route Frequency Ordered Stop   04/15/23 1700  cefTRIAXone (ROCEPHIN) 1 g in sodium chloride 0.9 % 100 mL IVPB        1 g 200 mL/hr over 30 Minutes Intravenous Every 24 hours 04/14/23 1617     04/14/23 1600  cefTRIAXone (ROCEPHIN) 1 g in sodium chloride 0.9 % 100 mL IVPB        1 g 200 mL/hr over 30 Minutes Intravenous  Once 04/14/23 1552 04/14/23 1655          Medications  enoxaparin (LOVENOX) injection  40 mg Subcutaneous Q24H   insulin aspart  0-15 Units Subcutaneous TID WC   insulin aspart  0-5 Units Subcutaneous QHS   irbesartan  150 mg Oral Daily   levothyroxine  50 mcg Oral Q0600   nebivolol  5 mg Oral QHS   rivastigmine  1.5 mg Oral  BID   simvastatin  20 mg Oral QHS      Subjective:   Kristina [name] was seen and examined today.  Still having some stuttering although somewhat better from yesterday.  No dizziness, lightheadedness, headache or blurry vision.  Also noted that patient is stuttering more when she is conscious of it but then can speak full sentences during conversation.  Objective:   Vitals:   04/15/23 2029 04/16/23 0414 04/16/23 0813 04/16/23 1249  BP: 139/61 (!) 155/77 (!) 180/75 136/79  Pulse: 73 78 75 76  Resp: 20 18 20 16   Temp: 98.2 F (36.8 C) 98.1 F (36.7 C) 98.4 F (36.9 C) 97.6 F (36.4 C)  TempSrc: Oral Oral Oral   SpO2: 96% 97% 97% 97%  Weight:      Height:        Intake/Output Summary (Last 24 hours) at 04/16/2023 1546 Last data filed at 04/16/2023 0830 Gross per 24 hour  Intake 240 ml  Output --  Net 240 ml     Wt Readings from Last 3 Encounters:  04/14/23 66.3 kg  03/07/23 67.1 kg  12/06/22 68.5 kg   Physical Exam General: Alert and oriented x 3, NAD Cardiovascular: S1 S2 clear, RRR.  Respiratory: CTAB, no wheezing, rales or rhonchi Gastrointestinal: Soft, nontender, nondistended, NBS Ext: no pedal edema bilaterally Neuro: no new deficits, stuttering noted, improving Psych: Normal affect   Data Reviewed:  I have personally reviewed following labs    CBC Lab Results  Component Value Date   WBC 5.3 04/16/2023   RBC 3.85 (L) 04/16/2023   HGB 12.5 04/16/2023   HCT 37.1 04/16/2023   MCV 96.4 04/16/2023   MCH 32.5 04/16/2023   PLT 180 04/16/2023   MCHC 33.7 04/16/2023   RDW 13.2 04/16/2023   LYMPHSABS 0.8 04/14/2023   MONOABS 0.3 04/14/2023   EOSABS 0.1 04/14/2023   BASOSABS 0.0 04/14/2023     Last metabolic panel Lab Results  Component Value Date   NA 133 (L) 04/16/2023   K 4.2 04/16/2023   CL 98 04/16/2023   CO2 26 04/16/2023   BUN 14 04/16/2023   CREATININE 0.71 04/16/2023   GLUCOSE 159 (H) 04/16/2023   GFRNONAA >60 04/16/2023   GFRAA 76  03/22/2020   CALCIUM 9.4 04/16/2023   PROT 7.2 04/14/2023   ALBUMIN 4.2 04/14/2023   BILITOT 0.8 04/14/2023   ALKPHOS 65 04/14/2023   AST 15 04/14/2023   ALT 11 04/14/2023   ANIONGAP 9 04/16/2023    CBG (last 3)  Recent Labs    04/15/23 2138 04/16/23 0757 04/16/23 1356  GLUCAP 211*  160* 149*      Coagulation Profile: Recent Labs  Lab 04/14/23 1248  INR 1.1     Radiology Studies: I have personally reviewed the imaging studies  EEG adult  Result Date: April 18, 2023 Charlsie Quest, MD     04/18/2023  7:29 PM Patient Name: Kristina [name] MRN: [id number] Epilepsy Attending: Charlsie Quest Referring Physician/Provider: Cathren Harsh, MD Date: 04/18/23 Duration: 26.14 mins Patient history: 77yo F with word finding difficulty getting eeg to evaluate for seizure Level of alertness: Awake AEDs during EEG study: None Technical aspects: This EEG study was done with scalp electrodes positioned according to the 10-20 International system of electrode placement. Electrical activity was reviewed with band pass filter of 1-70Hz , sensitivity of 7 uV/mm, display speed of 77mm/sec with a 60Hz  notched filter applied as appropriate. EEG data were recorded continuously and digitally stored.  Video monitoring was available and reviewed as appropriate. Description: The posterior dominant rhythm consists of 6 Hz activity of moderate voltage (25-35 uV) seen predominantly in posterior head regions, symmetric and reactive to eye opening and eye closing. EEG showed continuous generalized predominantly 5 to 7 Hz theta slowing admixed with rare 2-3hz  delta slowing. Physiologic photic driving was not seen during photic stimulation.  Hyperventilation was not performed.   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 No seizures or epileptiform discharges were seen throughout the recording. Priyanka Renee Pain M.D. [facility] 04/16/2023, 3:46 PM  Available via Epic secure chat 7am-7pm After 7 pm, please refer to night coverage provider listed on amion.

## 2023-04-16 NOTE — Plan of Care (Signed)
  Problem: Nutrition: Goal: Risk of aspiration will decrease Outcome: Progressing   Problem: Coping: Goal: Will verbalize positive feelings about self Outcome: Progressing   Problem: Pain Managment: Goal: General experience of comfort will improve Outcome: Progressing

## 2023-04-16 NOTE — TOC Initial Note (Signed)
Transition of Care [facility]) - Initial/Assessment Note    Patient Details  Name: Kristina [name] MRN: [id number] Date of Birth: 03/06/1946  [facility] of [facility]) CM/SW Contact:    Otelia Santee, LCSW Phone Number: 04/16/2023, 3:02 PM  Clinical Narrative:                 Met with pt and sister at bedside to discuss recommendation for SNF. Pt currently lives alone and has a BSC and RW. Pt reports she has not been to SNF before but, has been receiving outpatient therapy for carpal tunnel. Pt currently does not have a preference for facility.  PASRR has been requested and additional documents have been faxed for review.  Referrals have been sent out for SNF placement and currently pending bed offers.    Expected Discharge Plan: Skilled Nursing Facility Barriers to Discharge: Awaiting State Approval [facility]), SNF Pending bed offer, Insurance Authorization   Patient Goals and CMS Choice Patient states their goals for this hospitalization and ongoing recovery are:: To transfer to SNF CMS Medicare.gov Compare Post Acute Care list provided to:: Patient Choice offered to / list presented to : Patient [place] ownership interest in [facility].provided to:: Patient    Expected Discharge Plan and Services In-house Referral: Clinical Social Work Discharge Planning Services: NA Post Acute Care Choice: Skilled Nursing Facility Living arrangements for the past 2 months: Single Family Home                 DME Arranged: N/A DME Agency: NA                  Prior Living Arrangements/Services Living arrangements for the past 2 months: Single Family Home Lives with:: Self, Pets Patient language and need for interpreter reviewed:: Yes Do you feel safe going back to the place where you live?: Yes      Need for Family Participation in Patient Care: No (Comment) Care giver support system in place?: No (comment) Current home services: DME (BSC, cane) Criminal Activity/Legal  Involvement Pertinent to Current Situation/Hospitalization: No - Comment as needed  Activities of Daily Living Home Assistive Devices/Equipment: Hand-held shower hose ADL Screening (condition at time of admission) Patient's cognitive ability adequate to safely complete daily activities?: Yes Is the patient deaf or have difficulty hearing?: No Does the patient have difficulty seeing, even when wearing glasses/contacts?: No Does the patient have difficulty concentrating, remembering, or making decisions?: Yes Patient able to express need for assistance with ADLs?: No Does the patient have difficulty dressing or bathing?: No Independently performs ADLs?: Yes (appropriate for developmental age) Does the patient have difficulty walking or climbing stairs?: No Weakness of Legs: None Weakness of Arms/Hands: None  Permission Sought/Granted Permission sought to share information with : Facility Medical sales representative, Family Supports Permission granted to share information with : Yes, Verbal Permission Granted     Permission granted to share info w AGENCY: SNF's  Permission granted to share info w Relationship: Sister     Emotional Assessment Appearance:: Appears stated age Attitude/Demeanor/Rapport: Engaged Affect (typically observed): Pleasant, Accepting Orientation: : Oriented to Self, Oriented to Place, Oriented to  Time, Oriented to Situation Alcohol / Substance Use: Not Applicable Psych Involvement: No (comment)  Admission diagnosis:  UTI (urinary tract infection) [N39.0] Patient Active Problem List   Diagnosis Date Noted   Dysarthria 04/15/2023   Accelerated hypertension 04/15/2023   Aphasia 03/07/2023   TIA (transient ischemic attack) 03/07/2023   Weakness 03/07/2023   Nausea  and vomiting 03/07/2023   Overweight (BMI 25.0-29.9) 03/07/2023   Headache 02/23/2022   Chronic diastolic CHF (congestive heart failure) (HCC) 02/23/2022   HTN (hypertension), malignant 02/22/2022    GERD (gastroesophageal reflux disease)    Chest pain    Hypercholesteremia 10/15/2018   GIB (gastrointestinal bleeding) 10/15/2018   Colitis 10/15/2018   Hypokalemia    Thyroid disease    Acute pyelonephritis 03/22/2018   Pyelonephritis    UTI (urinary tract infection) 03/21/2018   IBS (irritable bowel syndrome) 03/21/2018   Hypertensive disorder 02/12/2018   Dyslipidemia 02/12/2018   Pain in right knee 01/09/2018   Morbid obesity (HCC) 08/08/2015   Hyponatremia 05/01/2015   Gastroenteritis, acute 05/01/2015   Hypothyroidism 05/01/2015   Asthma in remission 05/01/2015   Diarrhea    Cough variant asthma 04/19/2015   Acute bronchitis 03/08/2015   Hypothyroidism, adult 02/19/2015   Dyspnea 02/18/2015   Upper airway cough syndrome 01/07/2015   Pre-syncope 12/08/2013   PVC (premature ventricular contraction) 08/26/2013   Rosacea    Anxiety    Diverticulitis    Controlled type 2 diabetes mellitus without complication, without long-term current use of insulin (HCC)    Obesity    PCP:  Shon Hale, MD Pharmacy:   [id number] - [place], [place] - [address] [place] [address] Phone: [phone] Fax: [phone]  [place] [facility] [address] [place] [address] Phone: [phone] Fax: [phone]     Social Determinants of Health (SDOH) Social History: SDOH Screenings   Food Insecurity: No Food Insecurity (04/14/2023)  Housing: Low Risk  (04/14/2023)  Transportation Needs: No Transportation Needs (04/14/2023)  Utilities: Not At Risk (04/14/2023)  Depression (PHQ2-9): Low Risk  (01/14/2020)  Tobacco Use: Low Risk  (04/14/2023)   SDOH Interventions:     Readmission Risk Interventions     No data to display

## 2023-04-16 NOTE — Evaluation (Signed)
Occupational Therapy Evaluation Patient Details Name: Kristina [name] MRN: [id number] DOB: 21-Mar-1946 Today's Date: 04/16/2023   History of Present Illness 77 y.o. female brought to [facility] with word finding difficulty. CT scan of the head, as well as MRI of the brain both of which were unremarkable for any acute process.  ER provider discussed with on-call neurology, who recommended treatment of her blood pressure and UTI. pt also with hyponatremia.    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 Patient was living at home independently prior level. Currently, patient is max A for standing balance with strong posterior leaning with decreased ability to follow commands to adjust posture. Patient appears to have poor insight to deficits with patient still reporting she can get up and walk across the room herself. Patient was noted to have decreased functional activity tolerance, decreased endurance, decreased standing balance, decreased safety awareness, and decreased knowledge of AD/AE impacting participation in ADLs. Patient will benefit from continued inpatient follow up therapy, <3 hours/day.       If plan is discharge home, recommend the following: A lot of help with bathing/dressing/bathroom;Assistance with cooking/housework;Direct supervision/assist for medications management;A lot of help with walking and/or transfers;Direct supervision/assist for financial management;Help with stairs or ramp for entrance;Assist for transportation;Supervision due to cognitive status    Functional Status Assessment  Patient has had a recent decline in their functional status and demonstrates the ability to make significant improvements in function in a reasonable and predictable amount of time.  Equipment Recommendations          Precautions / Restrictions Precautions Precautions:  Fall Restrictions Weight Bearing Restrictions: No      Mobility Bed Mobility Overal bed mobility: Needs Assistance Bed Mobility: Supine to Sit     Supine to sit: Min assist, HOB elevated     General bed mobility comments: light assist to elevate trunk, incr time and effort to transition to EOB. multi-modal cues to complete task         Balance Overall balance assessment: Needs assistance Sitting-balance support: Feet supported Sitting balance-Leahy Scale: Fair     Standing balance support: During functional activity, Reliant on assistive device for balance Standing balance-Leahy Scale: Poor Standing balance comment: strong posterior leaning with toes off the floor with each stand                           ADL either performed or assessed with clinical judgement   ADL Overall ADL's : Needs assistance/impaired Eating/Feeding: Set up;Sitting   Grooming: Set up;Sitting   Upper Body Bathing: Set up;Supervision/ safety;Sitting   Lower Body Bathing: Maximal assistance;Sit to/from stand;Sitting/lateral leans   Upper Body Dressing : Supervision/safety;Set up;Sitting   Lower Body Dressing: Maximal assistance;Sitting/lateral leans;Sit to/from stand   Toilet Transfer: Maximal assistance;Rolling walker (2 wheels);Stand-pivot Statistician Details (indicate cue type and reason): patient with strong posterior leaning with standing with toes clearly off the floor with patient having no awareness of deficit and difficulty following cues to fix. Toileting- Clothing Manipulation and Hygiene: Total assistance;Sit to/from stand Toileting - Clothing Manipulation Details (indicate cue type and reason): strong posterior leaning             Vision Baseline Vision/History: 1 Wears glasses              Pertinent Vitals/Pain Pain Assessment  Pain Assessment: No/denies pain     Extremity/Trunk Assessment Upper Extremity Assessment Upper Extremity Assessment: Overall  WFL for tasks assessed   Lower Extremity Assessment Lower Extremity Assessment: Defer to PT evaluation   Cervical / Trunk Assessment Cervical / Trunk Assessment: Kyphotic   Communication Communication Communication: Difficulty following commands/understanding;Difficulty communicating thoughts/reduced clarity of speech Following commands: Follows one step commands inconsistently Cueing Techniques: Verbal cues;Tactile cues;Visual cues (pt is easily distratcted, requiring frequent redirection to task)   Cognition Arousal: Alert Behavior During Therapy: WFL for tasks assessed/performed Overall Cognitive Status: Impaired/Different from baseline Area of Impairment: Memory, Attention, Following commands, Safety/judgement, Orientation                 Orientation Level: Disoriented to, Time Current Attention Level: Focused Memory: Decreased short-term memory, Decreased recall of precautions Following Commands: Follows one step commands with increased time, Follows multi-step commands inconsistently       General Comments: patient relying on sister to answer most of her word finding difficulties. patient confused and poor insight to deficits.                Home Living Family/patient expects to be discharged to:: Private residence Living Arrangements: Alone Available Help at Discharge: Other (Comment) (no consistent assist) Type of Home: House Home Access: Stairs to enter [employer] of Steps: threshold Entrance Stairs-Rails: None Home Layout: One level     Bathroom Shower/Tub: Chief Strategy Officer: Standard     Home Equipment: Cane - single point;Toilet riser;BSC/3in1;Rolling Walker (2 wheels)   Additional Comments: L CTR in August 2024      Prior Functioning/Environment Prior Level of Function : Independent/Modified Independent;Driving             Mobility Comments: cane occaisonally if needed ADLs Comments: Indep with ADL and  IADL        OT Problem List: Decreased activity tolerance;Impaired balance (sitting and/or standing);Decreased coordination;Decreased safety awareness;Decreased cognition;Decreased knowledge of use of DME or AE;Cardiopulmonary status limiting activity;Decreased knowledge of precautions      OT Treatment/Interventions: Self-care/ADL training;Energy conservation;Therapeutic exercise;DME and/or AE instruction;Therapeutic activities;Patient/family education;Balance training    OT Goals(Current goals can be found in the care plan section) Acute Rehab OT Goals Patient Stated Goal: to get back home OT Goal Formulation: With patient/family Time For Goal Achievement: 04/30/23 Potential to Achieve Goals: Fair  OT Frequency: Min 1X/week       AM-PAC OT "6 Clicks" Daily Activity     Outcome Measure Help from another person eating meals?: A Little Help from another person taking care of personal grooming?: A Little Help from another person toileting, which includes using toliet, bedpan, or urinal?: Total Help from another person bathing (including washing, rinsing, drying)?: A Lot Help from another person to put on and taking off regular upper body clothing?: A Little Help from another person to put on and taking off regular lower body clothing?: A Lot 6 Click Score: 14   End of Session Equipment Utilized During Treatment: Gait belt;Rolling walker (2 wheels);Other (comment) [facility]) Nurse Communication: Mobility status  Activity Tolerance: Patient tolerated treatment well Patient left: in bed;with call bell/phone within reach;with bed alarm set;with family/visitor present  OT Visit Diagnosis: Unsteadiness on feet (R26.81);History of falling (Z91.81);Other abnormalities of gait and mobility (R26.89);Repeated falls (R29.6);Muscle weakness (generalized) (M62.81)                Time: 2440-1027 OT Time Calculation (min): 24 min Charges:  OT General Charges $OT Visit: 1  Visit OT Evaluation $OT  Eval Low Complexity: 1 Low OT Treatments $Self Care/Home Management : 8-22 mins  Rosalio Loud, MS Acute Rehabilitation Department Office# [phone]   Selinda Flavin 04/16/2023, 3:43 PM

## 2023-04-16 NOTE — Evaluation (Signed)
Physical Therapy Evaluation Patient Details Name: Kristina [name] MRN: [id number] DOB: [birth year] Today's Date: 04/16/2023  History of Present Illness  77 y.o. female brought to [facility] with word finding difficulty. CT scan of the head, as well as MRI of the brain both of which were unremarkable for any acute process.  ER provider discussed with on-call neurology, who recommended treatment of her blood pressure and UTI. pt also with hyponatremia.    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   Pt and sister report pt is independent at her baseline, still drives, enjoys church and reading.  Pt presents today with significant memory and cognitive deficits  as well as balance deficits placing her at risk for falls. Patient will benefit from continued  follow up therapy, <3 hours/day at d/c. Will follow in acute setting.    Pt currently with functional limitations due to the deficits listed below (see PT Problem List). Pt will benefit from acute skilled PT to increase their independence and safety with mobility to allow discharge.           If plan is discharge home, recommend the following: A lot of help with walking and/or transfers;A lot of help with bathing/dressing/bathroom;Assistance with cooking/housework;Direct supervision/assist for financial management;Supervision due to cognitive status;Assist for transportation;Help with stairs or ramp for entrance;Direct supervision/assist for medications management   Can travel by private vehicle   Yes    Equipment Recommendations None recommended by PT  Recommendations for Other Services       Functional Status Assessment Patient has had a recent decline in their functional status and demonstrates the ability to make significant improvements in function in a reasonable and predictable amount of time.     Precautions / Restrictions  Precautions Precautions: Fall Restrictions Weight Bearing Restrictions: No      Mobility  Bed Mobility Overal bed mobility: Needs Assistance Bed Mobility: Supine to Sit     Supine to sit: Min assist, HOB elevated     General bed mobility comments: light assist to elevate trunk, incr time and effort to transition to EOB. multi-modal cues to complete task    Transfers Overall transfer level: Needs assistance Equipment used: Rolling walker (2 wheels) Transfers: Sit to/from Stand Sit to Stand: Min assist           General transfer comment: pt with posterior bias needing assist to bring wt anteriorly to allow COG over  BOS; multi-modal cues  for hand placement and safety    Ambulation/Gait Ambulation/Gait assistance: Min assist, Mod assist Gait Distance (Feet): 20 Feet Assistive device: Rolling walker (2 wheels) Gait Pattern/deviations: Step-through pattern, Decreased stride length, Leaning posteriorly, Trunk flexed, Narrow base of support       General Gait Details: multi-modal cues for trunk extension, RW position and to widen BOS, min to mod  assist throughout for RW direction and to prevent posterior LOB  Stairs            Wheelchair Mobility     Tilt Bed    Modified Rankin (Stroke Patients Only)       Balance Overall balance assessment: Needs assistance         Standing balance support: During functional activity, Reliant on assistive device for balance Standing balance-Leahy Scale: Poor Standing balance comment: reliant on device and external assist  Pertinent Vitals/Pain Pain Assessment Pain Assessment: No/denies pain    Home Living Family/patient expects to be discharged to:: Private residence Living Arrangements: Alone Available Help at Discharge: Other (Comment) (no consistent assist) Type of Home: House Home Access: Stairs to enter   [employer] of Steps: threshold   Home Layout:  One level Home Equipment: Cane - single point;Toilet riser;BSC/3in1;Rolling Environmental consultant (2 wheels) Additional Comments: L CTR in August 2024    Prior Function Prior Level of Function : Independent/Modified Independent;Driving             Mobility Comments: cane occaisonally if needed       Extremity/Trunk Assessment   Upper Extremity Assessment Upper Extremity Assessment: Defer to OT evaluation    Lower Extremity Assessment Lower Extremity Assessment: Overall WFL for tasks assessed       Communication   Communication Communication: Difficulty following commands/understanding;Difficulty communicating thoughts/reduced clarity of speech Cueing Techniques: Verbal cues;Tactile cues;Visual cues (pt is easily distratcted, requiring frequent redirection to task)  Cognition Arousal: Alert Behavior During Therapy: WFL for tasks assessed/performed Overall Cognitive Status: Impaired/Different from baseline Area of Impairment: Memory, Attention, Following commands, Safety/judgement, Orientation                 Orientation Level: Disoriented to, Time Current Attention Level: Focused Memory: Decreased short-term memory, Decreased recall of precautions Following Commands: Follows one step commands with increased time, Follows multi-step commands inconsistently Safety/Judgement: Decreased awareness of safety, Decreased awareness of deficits     General Comments: wtih incr time pt is able to state she is at "hospital" unsure of name of facility and year, knows month to Sept.        General Comments      Exercises     Assessment/Plan    PT Assessment Patient needs continued PT services  PT Problem List Decreased strength;Decreased range of motion;Decreased activity tolerance;Decreased mobility;Decreased balance;Decreased cognition;Decreased knowledge of use of DME;Decreased safety awareness;Decreased knowledge of precautions       PT Treatment Interventions DME  instruction;Gait training;Functional mobility training;Therapeutic activities;Therapeutic exercise;Patient/family education;Balance training    PT Goals (Current goals can be found in the Care Plan section)  Acute Rehab PT Goals Patient Stated Goal: rehab if needed PT Goal Formulation: With patient/family Time For Goal Achievement: 04/30/23 Potential to Achieve Goals: Fair    Frequency Min 1X/week     Co-evaluation               AM-PAC PT "6 Clicks" Mobility  Outcome Measure Help needed turning from your back to your side while in a flat bed without using bedrails?: A Little Help needed moving from lying on your back to sitting on the side of a flat bed without using bedrails?: A Little Help needed moving to and from a bed to a chair (including a wheelchair)?: A Little Help needed standing up from a chair using your arms (e.g., wheelchair or bedside chair)?: A Little Help needed to walk in hospital room?: A Little Help needed climbing 3-5 steps with a railing? : A Lot 6 Click Score: 17    End of Session Equipment Utilized During Treatment: Gait belt Activity Tolerance: Patient tolerated treatment well Patient left: in bed;with call bell/phone within reach;with bed alarm set   PT Visit Diagnosis: Other abnormalities of gait and mobility (R26.89);Unsteadiness on feet (R26.81);History of falling (Z91.81)    Time: 1610-9604 PT Time Calculation (min) (ACUTE ONLY): 36 min   Charges:   PT Evaluation $PT Eval Moderate Complexity: 1 Mod PT  Treatments $Gait Training: 8-22 mins PT General Charges $$ ACUTE PT VISIT: 1 Visit         Gregary Blackard, PT  Acute Rehab Dept [facility]) [phone]  04/16/2023   [facility] 04/16/2023, 1:58 PM

## 2023-04-16 NOTE — Progress Notes (Signed)
Poor safety awareness/impulsive at times. Reinforced safety precautions throughout night.  Expressive aphasia persists, resulting in frustration.  Continue to monitior

## 2023-04-17 ENCOUNTER — Encounter: Payer: Medicare PPO | Admitting: Occupational Therapy

## 2023-04-17 DIAGNOSIS — R471 Dysarthria and anarthria: Secondary | ICD-10-CM | POA: Diagnosis not present

## 2023-04-17 LAB — GLUCOSE, CAPILLARY
Glucose-Capillary: 137 mg/dL — ABNORMAL HIGH (ref 70–99)
Glucose-Capillary: 228 mg/dL — ABNORMAL HIGH (ref 70–99)
Glucose-Capillary: 294 mg/dL — ABNORMAL HIGH (ref 70–99)
Glucose-Capillary: 100 mg/dL — ABNORMAL HIGH (ref 70–99)

## 2023-04-17 MED ORDER — PNEUMOCOCCAL 20-VAL CONJ VACC 0.5 ML IM SUSY
0.5000 mL | PREFILLED_SYRINGE | INTRAMUSCULAR | Status: DC
  Filled 2023-04-17: qty 0.5

## 2023-04-17 MED ORDER — CARMEX CLASSIC LIP BALM EX OINT
1.0000 | TOPICAL_OINTMENT | CUTANEOUS | Status: DC | PRN
  Administered 2023-04-17: 1 via TOPICAL
  Filled 2023-04-17: qty 10

## 2023-04-17 MED ORDER — INFLUENZA VAC A&B SURF ANT ADJ 0.5 ML IM SUSY
0.5000 mL | PREFILLED_SYRINGE | INTRAMUSCULAR | Status: DC
  Filled 2023-04-17: qty 0.5

## 2023-04-17 MED ORDER — CAMPHOR-MENTHOL 0.5-0.5 % EX LOTN
TOPICAL_LOTION | CUTANEOUS | Status: DC | PRN
  Filled 2023-04-17: qty 222

## 2023-04-17 NOTE — Plan of Care (Signed)
Problem: Education: Goal: Knowledge of disease or condition will improve Outcome: Progressing   Problem: Ischemic Stroke/TIA Tissue Perfusion: Goal: Complications of ischemic stroke/TIA will be minimized Outcome: Progressing

## 2023-04-17 NOTE — Progress Notes (Signed)
Occupational Therapy Treatment Patient Details Name: Kristina [name] MRN: [id number] DOB: March 10, 1946 Today's Date: 04/17/2023   History of present illness 77 y.o. female brought to [facility] with word finding difficulty. CT scan of the head, as well as MRI of the brain both of which were unremarkable for any acute process.  ER provider discussed with on-call neurology, who recommended treatment of her blood pressure and UTI. pt also with hyponatremia.    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 Patient continues to have decreased safety with standing with toes coming off floor and weight shifting backwards instead of forwards.patient was min A for transfers on this date with patient able to participate in functional toileting tasks in bathroom in room with no LOB.  Patient will benefit from continued inpatient follow up therapy, <3 hours/day       If plan is discharge home, recommend the following:  A lot of help with bathing/dressing/bathroom;Assistance with cooking/housework;Direct supervision/assist for medications management;A lot of help with walking and/or transfers;Direct supervision/assist for financial management;Help with stairs or ramp for entrance;Assist for transportation;Supervision due to cognitive status         Precautions / Restrictions Precautions Precautions: Fall Restrictions Weight Bearing Restrictions: No       Mobility Bed Mobility Overal bed mobility: Needs Assistance Bed Mobility: Supine to Sit     Supine to sit: Min assist, HOB elevated                Balance Overall balance assessment: Needs assistance Sitting-balance support: Feet supported Sitting balance-Leahy Scale: Fair     Standing balance support: During functional activity, Reliant on assistive device for balance Standing balance-Leahy Scale: Poor                              ADL either performed or assessed with clinical judgement   ADL Overall ADL's : Needs assistance/impaired                         Toilet Transfer: Minimal assistance;Ambulation;Rolling walker (2 wheels) Toilet Transfer Details (indicate cue type and reason): to bathroom in room with use of grab bar Toileting- Clothing Manipulation and Hygiene: Contact guard assist;Sit to/from stand Toileting - Clothing Manipulation Details (indicate cue type and reason): with education on wiping from front to back to reduce risk of UTI       General ADL Comments: patient noted to have improved balance on this date. patient's sister present in room as well. patient has red area on back distal to L scapula that patient reported is very itchy. nurse called into room to asses.      Cognition Arousal: Alert Behavior During Therapy: WFL for tasks assessed/performed Overall Cognitive Status: Impaired/Different from baseline       General Comments: poor safety awareness during session                   Pertinent Vitals/ Pain       Pain Assessment Pain Assessment: No/denies pain         Frequency  Min 1X/week        Progress Toward Goals  OT Goals(current goals can now be found in the care plan section)  Progress towards OT goals: Progressing toward goals     Plan  AM-PAC OT "6 Clicks" Daily Activity     Outcome Measure   Help from another person eating meals?: A Little Help from another person taking care of personal grooming?: A Little Help from another person toileting, which includes using toliet, bedpan, or urinal?: A Lot Help from another person bathing (including washing, rinsing, drying)?: A Lot Help from another person to put on and taking off regular upper body clothing?: A Little Help from another person to put on and taking off regular lower body clothing?: A Lot 6 Click Score: 15    End of Session Equipment Utilized  During Treatment: Gait belt;Rolling walker (2 wheels)  OT Visit Diagnosis: Unsteadiness on feet (R26.81);History of falling (Z91.81);Other abnormalities of gait and mobility (R26.89);Repeated falls (R29.6);Muscle weakness (generalized) (M62.81)   Activity Tolerance Patient tolerated treatment well   Patient Left with call bell/phone within reach;with family/visitor present;in chair;with chair alarm set   Nurse Communication Other (comment) (rash looking area on back that nurse looked at during session)        Time: 1351-1417 OT Time Calculation (min): 26 min  Charges: OT General Charges $OT Visit: 1 Visit OT Treatments $Self Care/Home Management : 23-37 mins  Rosalio Loud, MS Acute Rehabilitation Department Office# [phone]   Selinda Flavin 04/17/2023, 2:54 PM

## 2023-04-17 NOTE — Plan of Care (Signed)
  Problem: Education: Goal: Knowledge of disease or condition will improve Outcome: Progressing Goal: Knowledge of secondary prevention will improve (MUST DOCUMENT ALL) Outcome: Progressing Goal: Knowledge of patient specific risk factors will improve Loraine Leriche N/A or DELETE if not current risk factor) Outcome: Progressing   Problem: Ischemic Stroke/TIA Tissue Perfusion: Goal: Complications of ischemic stroke/TIA will be minimized Outcome: Progressing   Problem: Coping: Goal: Will verbalize positive feelings about self Outcome: Progressing Goal: Will identify appropriate support needs Outcome: Progressing   Problem: Self-Care: Goal: Ability to participate in self-care as condition permits will improve Outcome: Progressing Goal: Verbalization of feelings and concerns over difficulty with self-care will improve Outcome: Progressing Goal: Ability to communicate needs accurately will improve Outcome: Progressing   Problem: Nutrition: Goal: Risk of aspiration will decrease Outcome: Progressing Goal: Dietary intake will improve Outcome: Progressing   Problem: Education: Goal: Knowledge of General Education information will improve Description: Including pain rating scale, medication(s)/side effects and non-pharmacologic comfort measures Outcome: Progressing   Problem: Health Behavior/Discharge Planning: Goal: Ability to manage health-related needs will improve Outcome: Progressing

## 2023-04-17 NOTE — TOC Progression Note (Signed)
Transition of Care [facility]) - Progression Note    Patient Details  Name: LANESHA HOFFERBER MRN: [id number] Date of Birth: 1945-09-26  [facility] CM/SW Contact  Otelia Santee, LCSW Phone Number: 04/17/2023, 11:59 AM  Clinical Narrative:    Pt's PASRR review complete and number assigned: [id number] A.  Reviewed bed offers with pt and her sister. Pt has accepted offer for a private room at [facility].  Insurance Berkley Harvey has been requested and is currently pending.    Expected Discharge Plan: Skilled Nursing Facility Barriers to Discharge: Awaiting State Approval Cherlyn Roberts), SNF Pending bed offer, Insurance Authorization  Expected Discharge Plan and Services In-house Referral: Clinical Social Work Discharge Planning Services: NA Post Acute Care Choice: Skilled Nursing Facility Living arrangements for the past 2 months: Single Family Home                 DME Arranged: N/A DME Agency: NA                   Social Determinants of Health (SDOH) Interventions SDOH Screenings   Food Insecurity: No Food Insecurity (04/14/2023)  Housing: Low Risk  (04/14/2023)  Transportation Needs: No Transportation Needs (04/14/2023)  Utilities: Not At Risk (04/14/2023)  Depression (PHQ2-9): Low Risk  (01/14/2020)  Tobacco Use: Low Risk  (04/14/2023)    Readmission Risk Interventions     No data to display

## 2023-04-17 NOTE — Progress Notes (Signed)
[facility]                                                                              Karlesha Karcher, is a 77 y.o. female, DOB - [birth year], WUJ:[id number] Admit date - 04/14/2023    Outpatient Primary MD for the patient is [name], [name], MD  LOS - 2  days  Chief Complaint  Patient presents with   Aphasia       Brief summary   Patient is a 77 year old female with history of hypertension, GERD, diabetes, hyponatremia presented with " word finding difficulty" since Friday, 3 days ago.  No other focal neurological deficits.  Per patient, in the last year she has had couple of episodes of word finding difficulty that seemed to be related to hyponatremia.  She was seen by neurology stroke service in 02/2023 when she was admitted with similar symptoms, underwent full stroke workup and was found to have hyponatremia.  She has a follow-up appointment with neurology outpatient on 06/10/2023. UA positive for UTI.  BP 193/119 on admission CBC, CMET unremarkable  Assessment & Plan    Principal Problem:   Dysarthria -patient admitted with word finding difficulty, otherwise no other focal neurological deficits.  Possibly due to accelerated hypertension and UTI.  Previous episodes were related to hyponatremia however sodium 136-139 on admission -UA positive for UTI, -CT head, MRI brain negative for any stroke, negative for headache, any focal FND's.  Patient had recent full stroke workup in 02/2023 -Continue to treat UTI, hypertension, EEG negative for any epileptiform discharges. -B12, folate, TSH normal, B1 pending -SLP for speech and cognitive evaluation -Neurology consulted, seen by Dr. 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  Outpatient follow-up with neurology (Dr [name]) -Lives alone, PT evaluation done, recommended SNF- -possible bed placement 9/19     Hypothyroidism, adult -Continue  Synthroid, TSH 3.3    UTI (urinary tract infection) Urine culture showed multiple species Continue IV Rocephin x 3 days  Accelerated hypertension -BP 193/119 on admission,  -Continue Bystolic, Avapro  -BP now improving, continue IV hydralazine as needed with parameters  Cognitive impairment -SLP evaluation for speech and cognitive evaluation -Stable off Aricept recently and was prescribed Exelon, as recommended by her neurologist, Dr. [name].  She has not started it yet.   Placed on Exelon 1.5 mg twice daily     Dyslipidemia Continue simvastatin   Estimated body mass index is 25.09 kg/m as calculated from the following:   Height as of this encounter: 5\' 4"  (1.626 m).   Weight as of this encounter: 66.3 kg.  Code Status: Full code DVT Prophylaxis:  enoxaparin (LOVENOX) injection 40 mg Start: 04/14/23 2200 SCDs Start: 04/14/23 1620   Level of Care: Level of care: Med-Surg Disposition Plan:      Remains inpatient appropriate PT evaluation done, recommended SNF   Consultants:   Neuro      Medications  enoxaparin (LOVENOX) injection  40 mg Subcutaneous Q24H   [START ON 04/18/2023] influenza vaccine adjuvanted  0.5 mL Intramuscular Tomorrow-1000   insulin aspart  0-15 Units Subcutaneous TID WC  insulin aspart  0-5 Units Subcutaneous QHS   irbesartan  150 mg Oral Daily   levothyroxine  50 mcg Oral Q0600   nebivolol  5 mg Oral QHS   [START ON 04/18/2023] pneumococcal 20-valent conjugate vaccine  0.5 mL Intramuscular Tomorrow-1000   rivastigmine  1.5 mg Oral BID   simvastatin  20 mg Oral QHS      Subjective:   Wondering if she will have speech problems forever  Objective:   Vitals:   04/16/23 0813 04/16/23 1249 04/16/23 1939 04/17/23 0553  BP: (!) 180/75 136/79 (!) 122/58 (!) 143/75  Pulse: 75 76 77 70  Resp: 20 16    Temp: 98.4 F (36.9 C) 97.6 F (36.4 C) 98.3 F (36.8 C) 98.6 F (37 C)  TempSrc: Oral     SpO2: 97% 97% 96% 96%  Weight:      Height:         Intake/Output Summary (Last 24 hours) at 04/17/2023 1232 Last data filed at 04/16/2023 1845 Gross per 24 hour  Intake 100 ml  Output --  Net 100 ml     Wt Readings from Last 3 Encounters:  04/14/23 66.3 kg  03/07/23 67.1 kg  12/06/22 68.5 kg   Physical Exam  General: Appearance:     Overweight female in no acute distress     Lungs:      respirations unlabored  Heart:    Normal heart rate. .   MS:   All extremities are intact.   Neurologic:   Awake, alert    Data Reviewed:  I have personally reviewed following labs    CBC Lab Results  Component Value Date   WBC 6.8 04/17/2023   RBC 3.78 (L) 04/17/2023   HGB 12.3 04/17/2023   HCT 37.2 04/17/2023   MCV 98.4 04/17/2023   MCH 32.5 04/17/2023   PLT 181 04/17/2023   MCHC 33.1 04/17/2023   RDW 13.2 04/17/2023   LYMPHSABS 0.8 04/14/2023   MONOABS 0.3 04/14/2023   EOSABS 0.1 04/14/2023   BASOSABS 0.0 04/14/2023     Last metabolic panel Lab Results  Component Value Date   NA 134 (L) 04/17/2023   K 3.7 04/17/2023   CL 99 04/17/2023   CO2 25 04/17/2023   BUN 17 04/17/2023   CREATININE 0.75 04/17/2023   GLUCOSE 116 (H) 04/17/2023   GFRNONAA >60 04/17/2023   GFRAA 76 03/22/2020   CALCIUM 9.1 04/17/2023   PROT 7.2 04/14/2023   ALBUMIN 4.2 04/14/2023   BILITOT 0.8 04/14/2023   ALKPHOS 65 04/14/2023   AST 15 04/14/2023   ALT 11 04/14/2023   ANIONGAP 10 04/17/2023    CBG (last 3)  Recent Labs    04/16/23 1734 04/16/23 2138 04/17/23 0735  GLUCAP 151* 104* 137*      Coagulation Profile: Recent Labs  Lab 04/14/23 1248  INR 1.1     Radiology Studies: I have personally reviewed the imaging studies  EEG adult  Result Date: 04/20/23 Charlsie Quest, MD     2023/04/20  7:29 PM Patient Name: ROXIE BERGNER MRN: [id number] Epilepsy Attending: Charlsie Quest Referring Physician/Provider: Cathren Harsh, MD Date: 04/20/23 Duration: 26.14 mins Patient history: 77yo F with word finding  difficulty getting eeg to evaluate for seizure Level of alertness: Awake AEDs during EEG study: None Technical aspects: This EEG study was done with scalp electrodes positioned according to the 10-20 International system of electrode placement. Electrical activity was reviewed with band pass filter of 1-70Hz ,  sensitivity of 7 uV/mm, display speed of 63mm/sec with a 60Hz  notched filter applied as appropriate. EEG data were recorded continuously and digitally stored.  Video monitoring was available and reviewed as appropriate. Description: The posterior dominant rhythm consists of 6 Hz activity of moderate voltage (25-35 uV) seen predominantly in posterior head regions, symmetric and reactive to eye opening and eye closing. EEG showed continuous generalized predominantly 5 to 7 Hz theta slowing admixed with rare 2-3hz  delta slowing. Physiologic photic driving was not seen during photic stimulation.  Hyperventilation was not performed.   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 No seizures or epileptiform discharges were seen throughout the recording. Priyanka Macky Lower DO [facility] 04/17/2023, 12:32 PM  Available via Epic secure chat 7am-7pm After 7 pm, please refer to night coverage provider listed on amion.

## 2023-04-17 NOTE — Progress Notes (Signed)
Physical Therapy Treatment Patient Details Name: Kristina [name] MRN: [id number] DOB: 24-Dec-1945 Today's Date: 04/17/2023   History of Present Illness 77 y.o. female brought to [facility] with word finding difficulty. CT scan of the head, as well as MRI of the brain both of which were unremarkable for any acute process.  ER provider discussed with on-call neurology, who recommended treatment of her blood pressure and UTI. pt also with hyponatremia.    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 Continues to have difficulty with word finding and following simple commands, requiring increased time. Pt however is very pleasant and motivated. Increased tolerance for gait training with RW, 14ft requiring MinA to stay inside AD, maneuver around objects, and encouragement to look up and not at floor. Pt returned to room, states she has agreed to SNF once cleared for d/c. Pt currently remains a high fall risk with significant decreased safety awareness. Will continue to benefit from PT per POC acutely and post d/c.   If plan is discharge home, recommend the following: A lot of help with walking and/or transfers;A lot of help with bathing/dressing/bathroom;Assistance with cooking/housework;Direct supervision/assist for financial management;Supervision due to cognitive status;Assist for transportation;Help with stairs or ramp for entrance;Direct supervision/assist for medications management   Can travel by private vehicle     Yes  Equipment Recommendations  None recommended by PT    Recommendations for Other Services       Precautions / Restrictions Precautions Precautions: Fall Restrictions Weight Bearing Restrictions: No     Mobility  Bed Mobility               General bed mobility comments: Pt in bedside recliner pre/post session    Transfers Overall transfer level: Needs  assistance Equipment used: Rolling walker (2 wheels) Transfers: Sit to/from Stand Sit to Stand: Min assist           General transfer comment: Heavy repeated verbal and visual cues for technique and hand placement    Ambulation/Gait Ambulation/Gait assistance: Min assist Gait Distance (Feet): 100 Feet Assistive device: Rolling walker (2 wheels) Gait Pattern/deviations: Step-through pattern, Decreased step length - right, Decreased step length - left, Shuffle, Drifts right/left, Trunk flexed Gait velocity: decreased     General Gait Details: multi-modal cues for trunk extension and to look up while walking. Assist with RW position and to widen BOS, min to mod  assist throughout for RW direction   Stairs             Wheelchair Mobility     Tilt Bed    Modified Rankin (Stroke Patients Only)       Balance Overall balance assessment: Needs assistance Sitting-balance support: Feet supported Sitting balance-Leahy Scale: Fair     Standing balance support: Bilateral upper extremity supported, During functional activity, Reliant on assistive device for balance Standing balance-Leahy Scale: Fair               High level balance activites: Direction changes (Gait training around objects with MinA to stay inside RW and maneuver AD properly) High Level Balance Comments: High risk for falls            Cognition Arousal: Alert Behavior During Therapy: WFL for tasks assessed/performed Overall Cognitive Status: Impaired/Different from baseline Area of Impairment: Memory, Attention, Following commands, Safety/judgement, Orientation  Orientation Level: Disoriented to, Time Current Attention Level: Focused Memory: Decreased short-term memory, Decreased recall of precautions Following Commands: Follows one step commands with increased time, Follows multi-step commands inconsistently Safety/Judgement: Decreased awareness of safety, Decreased  awareness of deficits     General Comments: poor safety awareness during session        Exercises General Exercises - Lower Extremity Ankle Circles/Pumps: AROM, Both, 15 reps, Seated Long Arc Quad: AROM, Both, 10 reps, Seated Hip Flexion/Marching: AROM, Both, 10 reps, Seated    General Comments General comments (skin integrity, edema, etc.): Red itchy spots on back, MD not terribly concerned & recommends anti-itch cream      Pertinent Vitals/Pain Pain Assessment Pain Assessment: No/denies pain    Home Living                          Prior Function            PT Goals (current goals can now be found in the care plan section) Acute Rehab PT Goals Patient Stated Goal: rehab if needed Progress towards PT goals: Progressing toward goals    Frequency    Min 1X/week      PT Plan      Co-evaluation              AM-PAC PT "6 Clicks" Mobility   Outcome Measure  Help needed turning from your back to your side while in a flat bed without using bedrails?: A Little Help needed moving from lying on your back to sitting on the side of a flat bed without using bedrails?: A Little Help needed moving to and from a bed to a chair (including a wheelchair)?: A Little Help needed standing up from a chair using your arms (e.g., wheelchair or bedside chair)?: A Little Help needed to walk in hospital room?: A Little Help needed climbing 3-5 steps with a railing? : A Lot 6 Click Score: 17    End of Session Equipment Utilized During Treatment: Gait belt Activity Tolerance: Patient tolerated treatment well Patient left: in chair;with call bell/phone within reach;with chair alarm set;with family/visitor present Nurse Communication: Mobility status PT Visit Diagnosis: Other abnormalities of gait and mobility (R26.89);Unsteadiness on feet (R26.81);History of falling (Z91.81)     Time: 1610-9604 PT Time Calculation (min) (ACUTE ONLY): 32 min  Charges:    $Gait  Training: 8-22 mins $Therapeutic Exercise: 8-22 mins PT General Charges $$ ACUTE PT VISIT: 1 Visit                    Zadie Cleverly, PTA  Jannet Askew 04/17/2023, 3:37 PM

## 2023-04-18 DIAGNOSIS — G309 Alzheimer's disease, unspecified: Secondary | ICD-10-CM | POA: Diagnosis not present

## 2023-04-18 DIAGNOSIS — R471 Dysarthria and anarthria: Secondary | ICD-10-CM | POA: Diagnosis not present

## 2023-04-18 DIAGNOSIS — E039 Hypothyroidism, unspecified: Secondary | ICD-10-CM | POA: Diagnosis not present

## 2023-04-18 DIAGNOSIS — G3184 Mild cognitive impairment, so stated: Secondary | ICD-10-CM | POA: Diagnosis not present

## 2023-04-18 DIAGNOSIS — R41841 Cognitive communication deficit: Secondary | ICD-10-CM | POA: Diagnosis not present

## 2023-04-18 DIAGNOSIS — R278 Other lack of coordination: Secondary | ICD-10-CM | POA: Diagnosis not present

## 2023-04-18 DIAGNOSIS — E119 Type 2 diabetes mellitus without complications: Secondary | ICD-10-CM | POA: Diagnosis not present

## 2023-04-18 DIAGNOSIS — N390 Urinary tract infection, site not specified: Secondary | ICD-10-CM | POA: Diagnosis not present

## 2023-04-18 DIAGNOSIS — R2689 Other abnormalities of gait and mobility: Secondary | ICD-10-CM | POA: Diagnosis not present

## 2023-04-18 DIAGNOSIS — E785 Hyperlipidemia, unspecified: Secondary | ICD-10-CM | POA: Diagnosis not present

## 2023-04-18 DIAGNOSIS — M6281 Muscle weakness (generalized): Secondary | ICD-10-CM | POA: Diagnosis not present

## 2023-04-18 DIAGNOSIS — I10 Essential (primary) hypertension: Secondary | ICD-10-CM | POA: Diagnosis not present

## 2023-04-18 DIAGNOSIS — K219 Gastro-esophageal reflux disease without esophagitis: Secondary | ICD-10-CM | POA: Diagnosis not present

## 2023-04-18 LAB — GLUCOSE, CAPILLARY
Glucose-Capillary: 177 mg/dL — ABNORMAL HIGH (ref 70–99)
Glucose-Capillary: 202 mg/dL — ABNORMAL HIGH (ref 70–99)

## 2023-04-18 MED ORDER — TRAZODONE HCL 50 MG PO TABS: 25.0000 mg | ORAL_TABLET | Freq: Every evening | ORAL | Status: AC | PRN

## 2023-04-18 MED ORDER — CEFDINIR 300 MG PO CAPS: 300.0000 mg | ORAL_CAPSULE | Freq: Two times a day (BID) | ORAL | Status: AC

## 2023-04-18 MED ORDER — CEFDINIR 300 MG PO CAPS
300.0000 mg | ORAL_CAPSULE | Freq: Two times a day (BID) | ORAL | Status: DC
  Administered 2023-04-18: 300 mg via ORAL
  Filled 2023-04-18 (×2): qty 1

## 2023-04-18 NOTE — Discharge Summary (Signed)
Physician Discharge Summary  [name]:[id number] DOB: [birth year] DOA: 04/14/2023  PCP: Shon Hale, MD  Admit date: 04/14/2023 Discharge date: 04/18/2023  Admitted From: home Discharge disposition: SNF   Recommendations for Outpatient Follow-Up:   Monitor abrasion on back-- apply creams as needed Close follow up with neurology outpatient   Discharge Diagnosis:   Principal Problem:   Dysarthria Active Problems:   Hypothyroidism, adult   UTI (urinary tract infection)   GERD (gastroesophageal reflux disease)   Chronic diastolic CHF (congestive heart failure) (HCC)   Dyslipidemia   Accelerated hypertension    Discharge Condition: Improved.   Code status: Full.   History of Present Illness:    Kristina [name] is a 77 y.o. female with medical history significant for labile hypertension, GERD, diabetes, hyponatremia being admitted to the hospital with word finding difficulty.  History is provided by the patient as well as her sister who is at the bedside, they state that in the last year she has had a couple of episodes of word finding difficulty that seem to be related to hyponatremia.  Earlier this year she also was seen by neurology, who diagnosed her with mild cognitive impairment and started her on Aricept.  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  She knew what she wanted to say, but was having a hard time forming the words.  Denies any other symptoms such as numbness, tingling, dizziness, extremity weakness, etc.  She had her sister bring her to the emergency department.    Hospital Course by Problem:     Dysarthria -patient admitted with word finding difficulty, otherwise no other focal neurological deficits.  Possibly due to accelerated hypertension and UTI.  Previous episodes were related to hyponatremia however sodium  136-139 on admission -UA positive for UTI, -CT head, MRI brain negative for any stroke, negative for headache, any focal FND's.  Patient had recent full stroke workup in 02/2023 -Continue to treat UTI, hypertension, EEG negative for any epileptiform discharges. -B12, folate, TSH normal, B1 pending -SLP for speech and cognitive evaluation -Neurology consulted, seen by Dr. 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  Outpatient follow-up with neurology (Dr [name]) -Lives alone, PT evaluation done, recommended SNF       Hypothyroidism, adult -Continue Synthroid, TSH 3.3     UTI (urinary tract infection) Urine culture showed multiple species Continue IV Rocephin x 3 days   Accelerated hypertension -BP 193/119 on admission,  -Continue Bystolic, Avapro  -BP now improving   Cognitive impairment -SLP evaluation for speech and cognitive evaluation -Stable off Aricept recently and was prescribed Exelon, as recommended by her neurologist, Dr. [name].  She had not started it yet so started in hospital        Dyslipidemia Continue simvastatin    Medical Consultants:   Neurology PT/OT   Discharge Exam:   Vitals:   04/18/23 0408 04/18/23 0631  BP: (!) 123/58   Pulse: 82   Resp: 18   Temp: 99.5 F (37.5 C) 99.1 F (37.3 C)  SpO2: 98%    Vitals:   04/17/23 0553 04/17/23 1946 04/18/23 0408 04/18/23 0631  BP: (!) 143/75 (!) 152/69 (!) 123/58   Pulse: 70 89 82   Resp:  18 18   Temp: 98.6 F (37 C) 98.8 F (37.1 C) 99.5 F (  37.5 C) 99.1 F (37.3 C)  TempSrc:  Oral  Oral  SpO2: 96% 98% 98%   Weight:      Height:        General exam: Appears calm and comfortable.  The results of significant diagnostics from this hospitalization (including imaging, microbiology, ancillary and laboratory) are listed below for reference.     Procedures and Diagnostic Studies:   EEG adult  Result Date: 04/15/2023 Charlsie Quest, MD     04/15/2023  7:29 PM Patient Name: Kristina [name] MRN: [id number] Epilepsy Attending: Charlsie Quest Referring Physician/Provider: Cathren Harsh, MD Date: 04/15/2023 Duration: 26.14 mins Patient history: 77yo F with word finding difficulty getting eeg to evaluate for seizure Level of alertness: Awake AEDs during EEG study: None Technical aspects: This EEG study was done with scalp electrodes positioned according to the 10-20 International system of electrode placement. Electrical activity was reviewed with band pass filter of 1-70Hz , sensitivity of 7 uV/mm, display speed of 31mm/sec with a 60Hz  notched filter applied as appropriate. EEG data were recorded continuously and digitally stored.  Video monitoring was available and reviewed as appropriate. Description: The posterior dominant rhythm consists of 6 Hz activity of moderate voltage (25-35 uV) seen predominantly in posterior head regions, symmetric and reactive to eye opening and eye closing. EEG showed continuous generalized predominantly 5 to 7 Hz theta slowing admixed with rare 2-3hz  delta slowing. Physiologic photic driving was not seen during photic stimulation.  Hyperventilation was not performed.   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 No seizures or epileptiform discharges were seen throughout the recording. Charlsie Quest     Labs:   Basic Metabolic Panel: Recent Labs  Lab 04/14/23 1248 04/14/23 1254 04/15/23 0525 04/16/23 0534 04/17/23 0546  NA 136 139 136 133* 134*  K 4.0 4.3 4.0 4.2 3.7  CL 102 101 101 98 99  CO2 26  --  27 26 25   GLUCOSE 160* 157* 141* 159* 116*  BUN 10 9 12 14 17   CREATININE 0.70 0.70 0.69 0.71 0.75  CALCIUM 9.5  --  9.4 9.4 9.1   GFR Estimated Creatinine Clearance: 54.3 mL/min (by C-G formula based on SCr of 0.75 mg/dL). Liver Function Tests: Recent Labs  Lab 04/14/23 1248  AST 15  ALT 11  ALKPHOS 65   BILITOT 0.8  PROT 7.2  ALBUMIN 4.2   No results for input(s): "LIPASE", "AMYLASE" in the last 168 hours. No results for input(s): "AMMONIA" in the last 168 hours. Coagulation profile Recent Labs  Lab 04/14/23 1248  INR 1.1    CBC: Recent Labs  Lab 04/14/23 1248 04/14/23 1254 04/15/23 0525 04/16/23 0534 04/17/23 0546  WBC 7.0  --  5.0 5.3 6.8  NEUTROABS 5.8  --   --   --   --   HGB 13.4 13.9 12.5 12.5 12.3  HCT 40.7 41.0 37.6 37.1 37.2  MCV 97.4  --  98.7 96.4 98.4  PLT 232  --  193 180 181   Cardiac Enzymes: No results for input(s): "CKTOTAL", "CKMB", "CKMBINDEX", "TROPONINI" in the last 168 hours. BNP: Invalid input(s): "POCBNP" CBG: Recent Labs  Lab 04/17/23 0735 04/17/23 1252 04/17/23 1736 04/17/23 2109 04/18/23 0727  GLUCAP 137* 228* 294* 100* 177*   D-Dimer No results for input(s): "DDIMER" in the last 72 hours. Hgb A1c No results for input(s): "HGBA1C" in the last 72 hours. Lipid Profile No results for input(s): "CHOL", "HDL", "  LDLCALC", "TRIG", "CHOLHDL", "LDLDIRECT" in the last 72 hours. Thyroid function studies No results for input(s): "TSH", "T4TOTAL", "T3FREE", "THYROIDAB" in the last 72 hours.  Invalid input(s): "FREET3" Anemia work up No results for input(s): "VITAMINB12", "FOLATE", "FERRITIN", "TIBC", "IRON", "RETICCTPCT" in the last 72 hours. Microbiology Recent Results (from the past 240 hour(s))  Urine Culture     Status: Abnormal   Collection Time: 04/14/23  4:34 PM   Specimen: Urine, Clean Catch  Result Value Ref Range Status   Specimen Description   Final    URINE, CLEAN CATCH Performed at [facility], [address], [place], [place] [address]    Special Requests   Final    NONE Performed at [facility], [address], [place], [place] [address]    Culture MULTIPLE SPECIES PRESENT, SUGGEST RECOLLECTION (A)  Final   Report Status 04/15/2023 FINAL  Final     Discharge Instructions:    Discharge Instructions     Diet general   Complete by: As directed    Increase activity slowly   Complete by: As directed       Allergies as of 04/18/2023       Reactions   Ace Inhibitors Cough   Cipro [ciprofloxacin Hcl] Other (See Comments)   Tendonitis and tendon rupture, achilles tendon issue - posterior right LE   Vibra-tab [doxycycline] Other (See Comments)   Unknown reaction   Shellfish Allergy Diarrhea, Nausea And Vomiting, Rash, Other (See Comments)   Purple spots on skin        Medication List     TAKE these medications    acetaminophen 650 MG CR tablet Commonly known as: TYLENOL Take 650-1,300 mg by mouth 2 (two) times daily as needed (knee pain).   cefdinir 300 MG capsule Commonly known as: OMNICEF Take 1 capsule (300 mg total) by mouth every 12 (twelve) hours.   hyoscyamine 0.125 MG tablet Commonly known as: LEVSIN Take 0.125 mg by mouth 2 (two) times daily as needed for cramping.   levothyroxine 50 MCG tablet Commonly known as: SYNTHROID Take 50 mcg by mouth every morning.   mesalamine 0.375 g 24 hr capsule Commonly known as: APRISO Take 1,125 mg by mouth daily.   metFORMIN 500 MG 24 hr tablet Commonly known as: GLUCOPHAGE-XR Take 3 tablets (1,500 mg total) by mouth daily. What changed: when to take this   nebivolol 5 MG tablet Commonly known as: BYSTOLIC Take 5 mg by mouth at bedtime.   rivastigmine 1.5 MG capsule Commonly known as: EXELON Take 1 capsule (1.5 mg total) by mouth 2 (two) times daily.   simvastatin 20 MG tablet Commonly known as: ZOCOR Take 20 mg by mouth at bedtime.   traZODone 50 MG tablet Commonly known as: DESYREL Take 0.5 tablets (25 mg total) by mouth at bedtime as needed for sleep.   valsartan 160 MG tablet Commonly known as: Diovan Take 1 tablet (160 mg total) by mouth daily.   VITAMIN B-12 PO Take 1 tablet by mouth daily.        Contact information for after-discharge care     Destination      [facility]erred [facility] .   Service: Skilled Nursing Contact information: [address] Test Update Address [place] [address] [phone]                      Time coordinating discharge: 45 min  Signed:  Joseph Art DO  [facility] 04/18/2023, 10:48 AM

## 2023-04-18 NOTE — Progress Notes (Signed)
Called report to RN at [facility]. Sister to transport. DC packet given to sister by Flow nurse.

## 2023-04-18 NOTE — TOC Transition Note (Signed)
Transition of Care [facility]) - CM/SW Discharge Note   Patient Details  Name: Kristina [name] MRN: [id number] Date of Birth: 1946-03-01  Transition of Care [facility] CM/SW Contact:  Otelia Santee, LCSW Phone Number: 04/18/2023, 2:16 PM   Clinical Narrative:    Pt is to transfer to [facility] for SNF. Pt will be going to room 603b. RN to call report to [phone]. Pt's sister to transport pt to facility. DC packet placed at RN station.    Final next level of care: Skilled Nursing Facility Barriers to Discharge: Barriers Resolved   Patient Goals and CMS Choice CMS Medicare.gov Compare Post Acute Care list provided to:: Patient Choice offered to / list presented to : Patient  Discharge Placement PASRR number recieved: 04/17/23 PASRR number recieved: 04/17/23            Patient chooses bed at: [facility] Patient to be transferred to facility by: Sister Name of family member notified: Patient Patient and family notified of of transfer: 04/18/23  Discharge Plan and Services Additional resources added to the After Visit Summary for   In-house Referral: Clinical Social Work Discharge Planning Services: NA Post Acute Care Choice: Skilled Nursing Facility          DME Arranged: N/A DME Agency: NA                  Social Determinants of Health (SDOH) Interventions SDOH Screenings   Food Insecurity: No Food Insecurity (04/14/2023)  Housing: Low Risk  (04/14/2023)  Transportation Needs: No Transportation Needs (04/14/2023)  Utilities: Not At Risk (04/14/2023)  Depression (PHQ2-9): Low Risk  (01/14/2020)  Tobacco Use: Low Risk  (04/14/2023)     Readmission Risk Interventions    04/18/2023    2:15 PM  Readmission Risk Prevention Plan  Post Dischage Appt Complete  Medication Screening Complete  Transportation Screening Complete

## 2023-04-19 ENCOUNTER — Ambulatory Visit: Payer: Medicare PPO | Admitting: Occupational Therapy

## 2023-04-19 DIAGNOSIS — N390 Urinary tract infection, site not specified: Secondary | ICD-10-CM | POA: Diagnosis not present

## 2023-04-19 DIAGNOSIS — R41841 Cognitive communication deficit: Secondary | ICD-10-CM | POA: Diagnosis not present

## 2023-04-19 DIAGNOSIS — I10 Essential (primary) hypertension: Secondary | ICD-10-CM | POA: Diagnosis not present

## 2023-04-19 DIAGNOSIS — E119 Type 2 diabetes mellitus without complications: Secondary | ICD-10-CM | POA: Diagnosis not present

## 2023-04-24 ENCOUNTER — Encounter: Payer: Self-pay | Admitting: Occupational Therapy

## 2023-04-24 ENCOUNTER — Ambulatory Visit: Payer: Medicare PPO | Admitting: Occupational Therapy

## 2023-04-24 DIAGNOSIS — E039 Hypothyroidism, unspecified: Secondary | ICD-10-CM | POA: Diagnosis not present

## 2023-04-24 DIAGNOSIS — I10 Essential (primary) hypertension: Secondary | ICD-10-CM | POA: Diagnosis not present

## 2023-04-24 DIAGNOSIS — E119 Type 2 diabetes mellitus without complications: Secondary | ICD-10-CM | POA: Diagnosis not present

## 2023-04-24 DIAGNOSIS — E785 Hyperlipidemia, unspecified: Secondary | ICD-10-CM | POA: Diagnosis not present

## 2023-04-24 NOTE — Therapy (Signed)
OT discharge completed. Pt recently hospitalized for UTI with confusion. Pt's son reports she is in a SNF and will likely transition to an ALF.   OT educated pt's son that she will require a new order for therapy should she require OP services in the future.

## 2023-04-25 DIAGNOSIS — G309 Alzheimer's disease, unspecified: Secondary | ICD-10-CM | POA: Diagnosis not present

## 2023-04-25 DIAGNOSIS — N390 Urinary tract infection, site not specified: Secondary | ICD-10-CM | POA: Diagnosis not present

## 2023-04-25 DIAGNOSIS — E039 Hypothyroidism, unspecified: Secondary | ICD-10-CM | POA: Diagnosis not present

## 2023-04-25 DIAGNOSIS — R471 Dysarthria and anarthria: Secondary | ICD-10-CM | POA: Diagnosis not present

## 2023-04-29 DIAGNOSIS — G3184 Mild cognitive impairment, so stated: Secondary | ICD-10-CM | POA: Diagnosis not present

## 2023-04-29 DIAGNOSIS — N390 Urinary tract infection, site not specified: Secondary | ICD-10-CM | POA: Diagnosis not present

## 2023-04-29 DIAGNOSIS — R41841 Cognitive communication deficit: Secondary | ICD-10-CM | POA: Diagnosis not present

## 2023-04-29 DIAGNOSIS — R4701 Aphasia: Secondary | ICD-10-CM | POA: Diagnosis not present

## 2023-05-01 ENCOUNTER — Encounter: Payer: Medicare PPO | Admitting: Occupational Therapy

## 2023-05-01 DIAGNOSIS — N390 Urinary tract infection, site not specified: Secondary | ICD-10-CM | POA: Diagnosis not present

## 2023-05-03 DIAGNOSIS — R2689 Other abnormalities of gait and mobility: Secondary | ICD-10-CM | POA: Diagnosis not present

## 2023-05-03 DIAGNOSIS — I10 Essential (primary) hypertension: Secondary | ICD-10-CM | POA: Diagnosis not present

## 2023-05-03 DIAGNOSIS — I5030 Unspecified diastolic (congestive) heart failure: Secondary | ICD-10-CM | POA: Diagnosis not present

## 2023-05-03 DIAGNOSIS — K219 Gastro-esophageal reflux disease without esophagitis: Secondary | ICD-10-CM | POA: Diagnosis not present

## 2023-05-03 DIAGNOSIS — M6281 Muscle weakness (generalized): Secondary | ICD-10-CM | POA: Diagnosis not present

## 2023-05-03 DIAGNOSIS — E119 Type 2 diabetes mellitus without complications: Secondary | ICD-10-CM | POA: Diagnosis not present

## 2023-05-03 DIAGNOSIS — N390 Urinary tract infection, site not specified: Secondary | ICD-10-CM | POA: Diagnosis not present

## 2023-05-03 DIAGNOSIS — G3184 Mild cognitive impairment, so stated: Secondary | ICD-10-CM | POA: Diagnosis not present

## 2023-05-03 DIAGNOSIS — R4701 Aphasia: Secondary | ICD-10-CM | POA: Diagnosis not present

## 2023-05-08 ENCOUNTER — Encounter: Payer: Medicare PPO | Admitting: Occupational Therapy

## 2023-05-15 ENCOUNTER — Encounter: Payer: Medicare PPO | Admitting: Occupational Therapy

## 2023-05-24 DIAGNOSIS — K5190 Ulcerative colitis, unspecified, without complications: Secondary | ICD-10-CM | POA: Diagnosis not present

## 2023-05-24 DIAGNOSIS — M542 Cervicalgia: Secondary | ICD-10-CM | POA: Diagnosis not present

## 2023-05-24 DIAGNOSIS — R41841 Cognitive communication deficit: Secondary | ICD-10-CM | POA: Diagnosis not present

## 2023-05-24 DIAGNOSIS — E039 Hypothyroidism, unspecified: Secondary | ICD-10-CM | POA: Diagnosis not present

## 2023-05-24 DIAGNOSIS — E785 Hyperlipidemia, unspecified: Secondary | ICD-10-CM | POA: Diagnosis not present

## 2023-05-24 DIAGNOSIS — E1169 Type 2 diabetes mellitus with other specified complication: Secondary | ICD-10-CM | POA: Diagnosis not present

## 2023-05-24 DIAGNOSIS — I10 Essential (primary) hypertension: Secondary | ICD-10-CM | POA: Diagnosis not present

## 2023-05-24 DIAGNOSIS — F0280 Dementia in other diseases classified elsewhere without behavioral disturbance: Secondary | ICD-10-CM | POA: Diagnosis not present

## 2023-05-24 DIAGNOSIS — G309 Alzheimer's disease, unspecified: Secondary | ICD-10-CM | POA: Diagnosis not present

## 2023-05-30 DIAGNOSIS — F0280 Dementia in other diseases classified elsewhere without behavioral disturbance: Secondary | ICD-10-CM | POA: Diagnosis not present

## 2023-05-30 DIAGNOSIS — G309 Alzheimer's disease, unspecified: Secondary | ICD-10-CM | POA: Diagnosis not present

## 2023-05-30 DIAGNOSIS — F411 Generalized anxiety disorder: Secondary | ICD-10-CM | POA: Diagnosis not present

## 2023-05-30 DIAGNOSIS — G4700 Insomnia, unspecified: Secondary | ICD-10-CM | POA: Diagnosis not present

## 2023-05-31 DIAGNOSIS — E1169 Type 2 diabetes mellitus with other specified complication: Secondary | ICD-10-CM | POA: Diagnosis not present

## 2023-05-31 DIAGNOSIS — E785 Hyperlipidemia, unspecified: Secondary | ICD-10-CM | POA: Diagnosis not present

## 2023-05-31 DIAGNOSIS — I10 Essential (primary) hypertension: Secondary | ICD-10-CM | POA: Diagnosis not present

## 2023-05-31 DIAGNOSIS — Z130 Encounter for screening for diseases of the blood and blood-forming organs and certain disorders involving the immune mechanism: Secondary | ICD-10-CM | POA: Diagnosis not present

## 2023-06-10 ENCOUNTER — Ambulatory Visit: Payer: Medicare PPO | Admitting: Neurology

## 2023-06-21 DIAGNOSIS — I129 Hypertensive chronic kidney disease with stage 1 through stage 4 chronic kidney disease, or unspecified chronic kidney disease: Secondary | ICD-10-CM | POA: Diagnosis not present

## 2023-06-21 DIAGNOSIS — E039 Hypothyroidism, unspecified: Secondary | ICD-10-CM | POA: Diagnosis not present

## 2023-06-21 DIAGNOSIS — E538 Deficiency of other specified B group vitamins: Secondary | ICD-10-CM | POA: Diagnosis not present

## 2023-06-21 DIAGNOSIS — N182 Chronic kidney disease, stage 2 (mild): Secondary | ICD-10-CM | POA: Diagnosis not present

## 2023-06-25 DIAGNOSIS — F411 Generalized anxiety disorder: Secondary | ICD-10-CM | POA: Diagnosis not present

## 2023-06-25 DIAGNOSIS — F0280 Dementia in other diseases classified elsewhere without behavioral disturbance: Secondary | ICD-10-CM | POA: Diagnosis not present

## 2023-06-25 DIAGNOSIS — G309 Alzheimer's disease, unspecified: Secondary | ICD-10-CM | POA: Diagnosis not present

## 2023-06-25 DIAGNOSIS — G4700 Insomnia, unspecified: Secondary | ICD-10-CM | POA: Diagnosis not present

## 2023-07-19 DIAGNOSIS — E1169 Type 2 diabetes mellitus with other specified complication: Secondary | ICD-10-CM | POA: Diagnosis not present

## 2023-07-19 DIAGNOSIS — Z7984 Long term (current) use of oral hypoglycemic drugs: Secondary | ICD-10-CM | POA: Diagnosis not present

## 2023-07-19 DIAGNOSIS — F02A0 Dementia in other diseases classified elsewhere, mild, without behavioral disturbance, psychotic disturbance, mood disturbance, and anxiety: Secondary | ICD-10-CM | POA: Diagnosis not present

## 2023-07-19 DIAGNOSIS — E785 Hyperlipidemia, unspecified: Secondary | ICD-10-CM | POA: Diagnosis not present

## 2023-07-19 DIAGNOSIS — G309 Alzheimer's disease, unspecified: Secondary | ICD-10-CM | POA: Diagnosis not present

## 2023-07-25 DIAGNOSIS — F411 Generalized anxiety disorder: Secondary | ICD-10-CM | POA: Diagnosis not present

## 2023-07-28 DIAGNOSIS — Z7984 Long term (current) use of oral hypoglycemic drugs: Secondary | ICD-10-CM | POA: Diagnosis not present

## 2023-07-28 DIAGNOSIS — I10 Essential (primary) hypertension: Secondary | ICD-10-CM | POA: Diagnosis not present

## 2023-07-28 DIAGNOSIS — E119 Type 2 diabetes mellitus without complications: Secondary | ICD-10-CM | POA: Diagnosis not present

## 2023-07-28 DIAGNOSIS — K625 Hemorrhage of anus and rectum: Secondary | ICD-10-CM | POA: Diagnosis not present

## 2023-07-28 DIAGNOSIS — N281 Cyst of kidney, acquired: Secondary | ICD-10-CM | POA: Diagnosis not present

## 2023-07-28 DIAGNOSIS — Z79890 Hormone replacement therapy (postmenopausal): Secondary | ICD-10-CM | POA: Diagnosis not present

## 2023-07-28 DIAGNOSIS — Z9049 Acquired absence of other specified parts of digestive tract: Secondary | ICD-10-CM | POA: Diagnosis not present

## 2023-07-28 DIAGNOSIS — R9431 Abnormal electrocardiogram [ECG] [EKG]: Secondary | ICD-10-CM | POA: Diagnosis not present

## 2023-07-28 DIAGNOSIS — K6389 Other specified diseases of intestine: Secondary | ICD-10-CM | POA: Diagnosis not present

## 2023-07-28 DIAGNOSIS — K589 Irritable bowel syndrome without diarrhea: Secondary | ICD-10-CM | POA: Diagnosis not present

## 2023-07-28 DIAGNOSIS — K8689 Other specified diseases of pancreas: Secondary | ICD-10-CM | POA: Diagnosis not present

## 2023-07-28 DIAGNOSIS — E7800 Pure hypercholesterolemia, unspecified: Secondary | ICD-10-CM | POA: Diagnosis not present

## 2023-07-29 DIAGNOSIS — K625 Hemorrhage of anus and rectum: Secondary | ICD-10-CM | POA: Diagnosis not present

## 2023-07-29 DIAGNOSIS — R9431 Abnormal electrocardiogram [ECG] [EKG]: Secondary | ICD-10-CM | POA: Diagnosis not present

## 2023-07-30 DIAGNOSIS — K625 Hemorrhage of anus and rectum: Secondary | ICD-10-CM | POA: Diagnosis not present

## 2023-08-01 DIAGNOSIS — K589 Irritable bowel syndrome without diarrhea: Secondary | ICD-10-CM | POA: Diagnosis not present

## 2023-08-01 DIAGNOSIS — K922 Gastrointestinal hemorrhage, unspecified: Secondary | ICD-10-CM | POA: Diagnosis not present

## 2023-08-01 DIAGNOSIS — F411 Generalized anxiety disorder: Secondary | ICD-10-CM | POA: Diagnosis not present

## 2023-08-01 DIAGNOSIS — K838 Other specified diseases of biliary tract: Secondary | ICD-10-CM | POA: Diagnosis not present

## 2023-08-01 DIAGNOSIS — K6389 Other specified diseases of intestine: Secondary | ICD-10-CM | POA: Diagnosis not present

## 2023-08-02 DIAGNOSIS — K51919 Ulcerative colitis, unspecified with unspecified complications: Secondary | ICD-10-CM | POA: Diagnosis not present

## 2023-08-03 DIAGNOSIS — K51919 Ulcerative colitis, unspecified with unspecified complications: Secondary | ICD-10-CM | POA: Diagnosis not present

## 2023-08-05 DIAGNOSIS — Z5309 Procedure and treatment not carried out because of other contraindication: Secondary | ICD-10-CM | POA: Diagnosis not present

## 2023-08-05 DIAGNOSIS — K921 Melena: Secondary | ICD-10-CM | POA: Diagnosis not present

## 2023-08-05 DIAGNOSIS — Z79890 Hormone replacement therapy (postmenopausal): Secondary | ICD-10-CM | POA: Diagnosis not present

## 2023-08-05 DIAGNOSIS — K6389 Other specified diseases of intestine: Secondary | ICD-10-CM | POA: Diagnosis not present

## 2023-08-05 DIAGNOSIS — K529 Noninfective gastroenteritis and colitis, unspecified: Secondary | ICD-10-CM | POA: Diagnosis not present

## 2023-08-05 DIAGNOSIS — Z7984 Long term (current) use of oral hypoglycemic drugs: Secondary | ICD-10-CM | POA: Diagnosis not present

## 2023-08-05 DIAGNOSIS — E785 Hyperlipidemia, unspecified: Secondary | ICD-10-CM | POA: Diagnosis not present

## 2023-08-05 DIAGNOSIS — E039 Hypothyroidism, unspecified: Secondary | ICD-10-CM | POA: Diagnosis not present

## 2023-08-05 DIAGNOSIS — E7800 Pure hypercholesterolemia, unspecified: Secondary | ICD-10-CM | POA: Diagnosis not present

## 2023-08-05 DIAGNOSIS — R933 Abnormal findings on diagnostic imaging of other parts of digestive tract: Secondary | ICD-10-CM | POA: Diagnosis not present

## 2023-08-05 DIAGNOSIS — I10 Essential (primary) hypertension: Secondary | ICD-10-CM | POA: Diagnosis not present

## 2023-08-05 DIAGNOSIS — E119 Type 2 diabetes mellitus without complications: Secondary | ICD-10-CM | POA: Diagnosis not present

## 2023-08-05 DIAGNOSIS — K838 Other specified diseases of biliary tract: Secondary | ICD-10-CM | POA: Diagnosis not present

## 2023-08-06 DIAGNOSIS — R059 Cough, unspecified: Secondary | ICD-10-CM | POA: Diagnosis not present

## 2023-08-06 DIAGNOSIS — R0989 Other specified symptoms and signs involving the circulatory and respiratory systems: Secondary | ICD-10-CM | POA: Diagnosis not present

## 2023-08-08 DIAGNOSIS — F411 Generalized anxiety disorder: Secondary | ICD-10-CM | POA: Diagnosis not present

## 2023-08-15 DIAGNOSIS — G309 Alzheimer's disease, unspecified: Secondary | ICD-10-CM | POA: Diagnosis not present

## 2023-08-15 DIAGNOSIS — F411 Generalized anxiety disorder: Secondary | ICD-10-CM | POA: Diagnosis not present

## 2023-08-15 DIAGNOSIS — G4700 Insomnia, unspecified: Secondary | ICD-10-CM | POA: Diagnosis not present

## 2023-08-15 DIAGNOSIS — F02A0 Dementia in other diseases classified elsewhere, mild, without behavioral disturbance, psychotic disturbance, mood disturbance, and anxiety: Secondary | ICD-10-CM | POA: Diagnosis not present

## 2023-08-16 DIAGNOSIS — R0981 Nasal congestion: Secondary | ICD-10-CM | POA: Diagnosis not present

## 2023-08-16 DIAGNOSIS — E039 Hypothyroidism, unspecified: Secondary | ICD-10-CM | POA: Diagnosis not present

## 2023-08-16 DIAGNOSIS — G309 Alzheimer's disease, unspecified: Secondary | ICD-10-CM | POA: Diagnosis not present

## 2023-08-16 DIAGNOSIS — E0590 Thyrotoxicosis, unspecified without thyrotoxic crisis or storm: Secondary | ICD-10-CM | POA: Diagnosis not present

## 2023-08-16 DIAGNOSIS — E119 Type 2 diabetes mellitus without complications: Secondary | ICD-10-CM | POA: Diagnosis not present

## 2023-08-22 DIAGNOSIS — F411 Generalized anxiety disorder: Secondary | ICD-10-CM | POA: Diagnosis not present

## 2023-08-29 DIAGNOSIS — F411 Generalized anxiety disorder: Secondary | ICD-10-CM | POA: Diagnosis not present

## 2023-08-30 DIAGNOSIS — K6289 Other specified diseases of anus and rectum: Secondary | ICD-10-CM | POA: Diagnosis not present

## 2023-08-30 DIAGNOSIS — I10 Essential (primary) hypertension: Secondary | ICD-10-CM | POA: Diagnosis not present

## 2023-08-30 DIAGNOSIS — E785 Hyperlipidemia, unspecified: Secondary | ICD-10-CM | POA: Diagnosis not present

## 2023-08-30 DIAGNOSIS — K529 Noninfective gastroenteritis and colitis, unspecified: Secondary | ICD-10-CM | POA: Diagnosis not present

## 2023-08-30 DIAGNOSIS — K5730 Diverticulosis of large intestine without perforation or abscess without bleeding: Secondary | ICD-10-CM | POA: Diagnosis not present

## 2023-08-30 DIAGNOSIS — K625 Hemorrhage of anus and rectum: Secondary | ICD-10-CM | POA: Diagnosis not present

## 2023-08-30 DIAGNOSIS — E7800 Pure hypercholesterolemia, unspecified: Secondary | ICD-10-CM | POA: Diagnosis not present

## 2023-08-30 DIAGNOSIS — K5190 Ulcerative colitis, unspecified, without complications: Secondary | ICD-10-CM | POA: Diagnosis not present

## 2023-08-30 DIAGNOSIS — R9389 Abnormal findings on diagnostic imaging of other specified body structures: Secondary | ICD-10-CM | POA: Diagnosis not present

## 2023-08-30 DIAGNOSIS — K56699 Other intestinal obstruction unspecified as to partial versus complete obstruction: Secondary | ICD-10-CM | POA: Diagnosis not present

## 2023-08-30 DIAGNOSIS — R933 Abnormal findings on diagnostic imaging of other parts of digestive tract: Secondary | ICD-10-CM | POA: Diagnosis not present

## 2023-08-30 DIAGNOSIS — E119 Type 2 diabetes mellitus without complications: Secondary | ICD-10-CM | POA: Diagnosis not present

## 2023-08-30 DIAGNOSIS — K5669 Other partial intestinal obstruction: Secondary | ICD-10-CM | POA: Diagnosis not present

## 2023-09-05 DIAGNOSIS — F411 Generalized anxiety disorder: Secondary | ICD-10-CM | POA: Diagnosis not present

## 2023-09-11 DIAGNOSIS — F411 Generalized anxiety disorder: Secondary | ICD-10-CM | POA: Diagnosis not present

## 2023-09-12 DIAGNOSIS — G309 Alzheimer's disease, unspecified: Secondary | ICD-10-CM | POA: Diagnosis not present

## 2023-09-12 DIAGNOSIS — G4700 Insomnia, unspecified: Secondary | ICD-10-CM | POA: Diagnosis not present

## 2023-09-12 DIAGNOSIS — F411 Generalized anxiety disorder: Secondary | ICD-10-CM | POA: Diagnosis not present

## 2023-09-12 DIAGNOSIS — F02A0 Dementia in other diseases classified elsewhere, mild, without behavioral disturbance, psychotic disturbance, mood disturbance, and anxiety: Secondary | ICD-10-CM | POA: Diagnosis not present

## 2023-09-13 DIAGNOSIS — K51911 Ulcerative colitis, unspecified with rectal bleeding: Secondary | ICD-10-CM | POA: Diagnosis not present

## 2023-09-13 DIAGNOSIS — E039 Hypothyroidism, unspecified: Secondary | ICD-10-CM | POA: Diagnosis not present

## 2023-09-13 DIAGNOSIS — E538 Deficiency of other specified B group vitamins: Secondary | ICD-10-CM | POA: Diagnosis not present

## 2023-09-20 DIAGNOSIS — F411 Generalized anxiety disorder: Secondary | ICD-10-CM | POA: Diagnosis not present

## 2023-09-20 DIAGNOSIS — E538 Deficiency of other specified B group vitamins: Secondary | ICD-10-CM | POA: Diagnosis not present

## 2023-09-23 DIAGNOSIS — N182 Chronic kidney disease, stage 2 (mild): Secondary | ICD-10-CM | POA: Diagnosis not present

## 2023-09-23 DIAGNOSIS — D62 Acute posthemorrhagic anemia: Secondary | ICD-10-CM | POA: Diagnosis not present

## 2023-09-26 DIAGNOSIS — F411 Generalized anxiety disorder: Secondary | ICD-10-CM | POA: Diagnosis not present

## 2023-10-03 DIAGNOSIS — F411 Generalized anxiety disorder: Secondary | ICD-10-CM | POA: Diagnosis not present

## 2023-10-10 DIAGNOSIS — F411 Generalized anxiety disorder: Secondary | ICD-10-CM | POA: Diagnosis not present

## 2023-10-10 DIAGNOSIS — G309 Alzheimer's disease, unspecified: Secondary | ICD-10-CM | POA: Diagnosis not present

## 2023-10-10 DIAGNOSIS — G4700 Insomnia, unspecified: Secondary | ICD-10-CM | POA: Diagnosis not present

## 2023-10-10 DIAGNOSIS — F02A0 Dementia in other diseases classified elsewhere, mild, without behavioral disturbance, psychotic disturbance, mood disturbance, and anxiety: Secondary | ICD-10-CM | POA: Diagnosis not present

## 2023-10-11 DIAGNOSIS — I129 Hypertensive chronic kidney disease with stage 1 through stage 4 chronic kidney disease, or unspecified chronic kidney disease: Secondary | ICD-10-CM | POA: Diagnosis not present

## 2023-10-11 DIAGNOSIS — K51911 Ulcerative colitis, unspecified with rectal bleeding: Secondary | ICD-10-CM | POA: Diagnosis not present

## 2023-10-11 DIAGNOSIS — N182 Chronic kidney disease, stage 2 (mild): Secondary | ICD-10-CM | POA: Diagnosis not present

## 2023-10-11 DIAGNOSIS — G4700 Insomnia, unspecified: Secondary | ICD-10-CM | POA: Diagnosis not present

## 2023-10-13 DIAGNOSIS — K51811 Other ulcerative colitis with rectal bleeding: Secondary | ICD-10-CM | POA: Diagnosis not present

## 2023-10-13 DIAGNOSIS — K5669 Other partial intestinal obstruction: Secondary | ICD-10-CM | POA: Diagnosis not present

## 2023-10-13 DIAGNOSIS — E039 Hypothyroidism, unspecified: Secondary | ICD-10-CM | POA: Diagnosis not present

## 2023-10-13 DIAGNOSIS — K5730 Diverticulosis of large intestine without perforation or abscess without bleeding: Secondary | ICD-10-CM | POA: Diagnosis not present

## 2023-10-13 DIAGNOSIS — Z794 Long term (current) use of insulin: Secondary | ICD-10-CM | POA: Diagnosis not present

## 2023-10-13 DIAGNOSIS — Z79890 Hormone replacement therapy (postmenopausal): Secondary | ICD-10-CM | POA: Diagnosis not present

## 2023-10-13 DIAGNOSIS — E7800 Pure hypercholesterolemia, unspecified: Secondary | ICD-10-CM | POA: Diagnosis not present

## 2023-10-13 DIAGNOSIS — F0390 Unspecified dementia without behavioral disturbance: Secondary | ICD-10-CM | POA: Diagnosis not present

## 2023-10-13 DIAGNOSIS — I10 Essential (primary) hypertension: Secondary | ICD-10-CM | POA: Diagnosis not present

## 2023-10-13 DIAGNOSIS — K56699 Other intestinal obstruction unspecified as to partial versus complete obstruction: Secondary | ICD-10-CM | POA: Diagnosis not present

## 2023-10-13 DIAGNOSIS — R1084 Generalized abdominal pain: Secondary | ICD-10-CM | POA: Diagnosis not present

## 2023-10-13 DIAGNOSIS — K8689 Other specified diseases of pancreas: Secondary | ICD-10-CM | POA: Diagnosis not present

## 2023-10-13 DIAGNOSIS — K56609 Unspecified intestinal obstruction, unspecified as to partial versus complete obstruction: Secondary | ICD-10-CM | POA: Diagnosis not present

## 2023-10-13 DIAGNOSIS — Z9049 Acquired absence of other specified parts of digestive tract: Secondary | ICD-10-CM | POA: Diagnosis not present

## 2023-10-13 DIAGNOSIS — E871 Hypo-osmolality and hyponatremia: Secondary | ICD-10-CM | POA: Diagnosis not present

## 2023-10-13 DIAGNOSIS — D649 Anemia, unspecified: Secondary | ICD-10-CM | POA: Diagnosis not present

## 2023-10-13 DIAGNOSIS — E119 Type 2 diabetes mellitus without complications: Secondary | ICD-10-CM | POA: Diagnosis not present

## 2023-10-13 DIAGNOSIS — K51919 Ulcerative colitis, unspecified with unspecified complications: Secondary | ICD-10-CM | POA: Diagnosis not present

## 2023-10-13 DIAGNOSIS — K56600 Partial intestinal obstruction, unspecified as to cause: Secondary | ICD-10-CM | POA: Diagnosis not present

## 2023-10-13 DIAGNOSIS — K660 Peritoneal adhesions (postprocedural) (postinfection): Secondary | ICD-10-CM | POA: Diagnosis not present

## 2023-10-13 DIAGNOSIS — K838 Other specified diseases of biliary tract: Secondary | ICD-10-CM | POA: Diagnosis not present

## 2023-10-13 DIAGNOSIS — N3289 Other specified disorders of bladder: Secondary | ICD-10-CM | POA: Diagnosis not present

## 2023-10-13 DIAGNOSIS — N289 Disorder of kidney and ureter, unspecified: Secondary | ICD-10-CM | POA: Diagnosis not present

## 2023-10-13 DIAGNOSIS — D62 Acute posthemorrhagic anemia: Secondary | ICD-10-CM | POA: Diagnosis not present

## 2023-10-13 DIAGNOSIS — K625 Hemorrhage of anus and rectum: Secondary | ICD-10-CM | POA: Diagnosis not present

## 2023-10-13 DIAGNOSIS — K529 Noninfective gastroenteritis and colitis, unspecified: Secondary | ICD-10-CM | POA: Diagnosis not present

## 2023-10-13 DIAGNOSIS — Z79899 Other long term (current) drug therapy: Secondary | ICD-10-CM | POA: Diagnosis not present

## 2023-10-13 DIAGNOSIS — R4182 Altered mental status, unspecified: Secondary | ICD-10-CM | POA: Diagnosis not present

## 2023-10-13 DIAGNOSIS — K624 Stenosis of anus and rectum: Secondary | ICD-10-CM | POA: Diagnosis not present

## 2023-10-13 DIAGNOSIS — K922 Gastrointestinal hemorrhage, unspecified: Secondary | ICD-10-CM | POA: Diagnosis not present

## 2023-10-13 DIAGNOSIS — K921 Melena: Secondary | ICD-10-CM | POA: Diagnosis not present

## 2023-10-13 DIAGNOSIS — G8918 Other acute postprocedural pain: Secondary | ICD-10-CM | POA: Diagnosis not present

## 2023-10-13 DIAGNOSIS — K5720 Diverticulitis of large intestine with perforation and abscess without bleeding: Secondary | ICD-10-CM | POA: Diagnosis not present

## 2023-10-13 DIAGNOSIS — N2889 Other specified disorders of kidney and ureter: Secondary | ICD-10-CM | POA: Diagnosis not present

## 2023-10-13 DIAGNOSIS — Z8719 Personal history of other diseases of the digestive system: Secondary | ICD-10-CM | POA: Diagnosis not present

## 2023-10-13 DIAGNOSIS — F4489 Other dissociative and conversion disorders: Secondary | ICD-10-CM | POA: Diagnosis not present

## 2023-10-23 DIAGNOSIS — K51911 Ulcerative colitis, unspecified with rectal bleeding: Secondary | ICD-10-CM | POA: Diagnosis not present

## 2023-10-23 DIAGNOSIS — Z933 Colostomy status: Secondary | ICD-10-CM | POA: Diagnosis not present

## 2023-10-23 DIAGNOSIS — D62 Acute posthemorrhagic anemia: Secondary | ICD-10-CM | POA: Diagnosis not present

## 2023-10-24 DIAGNOSIS — I10 Essential (primary) hypertension: Secondary | ICD-10-CM | POA: Diagnosis not present

## 2023-10-24 DIAGNOSIS — K5730 Diverticulosis of large intestine without perforation or abscess without bleeding: Secondary | ICD-10-CM | POA: Diagnosis not present

## 2023-10-24 DIAGNOSIS — E119 Type 2 diabetes mellitus without complications: Secondary | ICD-10-CM | POA: Diagnosis not present

## 2023-10-24 DIAGNOSIS — D649 Anemia, unspecified: Secondary | ICD-10-CM | POA: Diagnosis not present

## 2023-10-24 DIAGNOSIS — F0390 Unspecified dementia without behavioral disturbance: Secondary | ICD-10-CM | POA: Diagnosis not present

## 2023-10-24 DIAGNOSIS — Z433 Encounter for attention to colostomy: Secondary | ICD-10-CM | POA: Diagnosis not present

## 2023-10-24 DIAGNOSIS — E079 Disorder of thyroid, unspecified: Secondary | ICD-10-CM | POA: Diagnosis not present

## 2023-10-24 DIAGNOSIS — K5190 Ulcerative colitis, unspecified, without complications: Secondary | ICD-10-CM | POA: Diagnosis not present

## 2023-10-24 DIAGNOSIS — Z48815 Encounter for surgical aftercare following surgery on the digestive system: Secondary | ICD-10-CM | POA: Diagnosis not present

## 2023-10-24 DIAGNOSIS — F411 Generalized anxiety disorder: Secondary | ICD-10-CM | POA: Diagnosis not present

## 2023-10-25 DIAGNOSIS — K51911 Ulcerative colitis, unspecified with rectal bleeding: Secondary | ICD-10-CM | POA: Diagnosis not present

## 2023-10-28 DIAGNOSIS — E785 Hyperlipidemia, unspecified: Secondary | ICD-10-CM | POA: Diagnosis not present

## 2023-10-28 DIAGNOSIS — D62 Acute posthemorrhagic anemia: Secondary | ICD-10-CM | POA: Diagnosis not present

## 2023-10-29 DIAGNOSIS — K5730 Diverticulosis of large intestine without perforation or abscess without bleeding: Secondary | ICD-10-CM | POA: Diagnosis not present

## 2023-10-29 DIAGNOSIS — F0390 Unspecified dementia without behavioral disturbance: Secondary | ICD-10-CM | POA: Diagnosis not present

## 2023-10-29 DIAGNOSIS — Z48815 Encounter for surgical aftercare following surgery on the digestive system: Secondary | ICD-10-CM | POA: Diagnosis not present

## 2023-10-29 DIAGNOSIS — I10 Essential (primary) hypertension: Secondary | ICD-10-CM | POA: Diagnosis not present

## 2023-10-29 DIAGNOSIS — D649 Anemia, unspecified: Secondary | ICD-10-CM | POA: Diagnosis not present

## 2023-10-29 DIAGNOSIS — E119 Type 2 diabetes mellitus without complications: Secondary | ICD-10-CM | POA: Diagnosis not present

## 2023-10-29 DIAGNOSIS — Z433 Encounter for attention to colostomy: Secondary | ICD-10-CM | POA: Diagnosis not present

## 2023-10-29 DIAGNOSIS — E079 Disorder of thyroid, unspecified: Secondary | ICD-10-CM | POA: Diagnosis not present

## 2023-10-29 DIAGNOSIS — K5190 Ulcerative colitis, unspecified, without complications: Secondary | ICD-10-CM | POA: Diagnosis not present

## 2023-10-31 DIAGNOSIS — F411 Generalized anxiety disorder: Secondary | ICD-10-CM | POA: Diagnosis not present

## 2023-11-01 DIAGNOSIS — R300 Dysuria: Secondary | ICD-10-CM | POA: Diagnosis not present

## 2023-11-01 DIAGNOSIS — R102 Pelvic and perineal pain: Secondary | ICD-10-CM | POA: Diagnosis not present

## 2023-11-01 DIAGNOSIS — B372 Candidiasis of skin and nail: Secondary | ICD-10-CM | POA: Diagnosis not present

## 2023-11-04 DIAGNOSIS — F0390 Unspecified dementia without behavioral disturbance: Secondary | ICD-10-CM | POA: Diagnosis not present

## 2023-11-04 DIAGNOSIS — K5190 Ulcerative colitis, unspecified, without complications: Secondary | ICD-10-CM | POA: Diagnosis not present

## 2023-11-04 DIAGNOSIS — E119 Type 2 diabetes mellitus without complications: Secondary | ICD-10-CM | POA: Diagnosis not present

## 2023-11-04 DIAGNOSIS — I10 Essential (primary) hypertension: Secondary | ICD-10-CM | POA: Diagnosis not present

## 2023-11-04 DIAGNOSIS — E079 Disorder of thyroid, unspecified: Secondary | ICD-10-CM | POA: Diagnosis not present

## 2023-11-04 DIAGNOSIS — K5730 Diverticulosis of large intestine without perforation or abscess without bleeding: Secondary | ICD-10-CM | POA: Diagnosis not present

## 2023-11-04 DIAGNOSIS — D649 Anemia, unspecified: Secondary | ICD-10-CM | POA: Diagnosis not present

## 2023-11-04 DIAGNOSIS — Z48815 Encounter for surgical aftercare following surgery on the digestive system: Secondary | ICD-10-CM | POA: Diagnosis not present

## 2023-11-04 DIAGNOSIS — Z433 Encounter for attention to colostomy: Secondary | ICD-10-CM | POA: Diagnosis not present

## 2023-11-07 DIAGNOSIS — F411 Generalized anxiety disorder: Secondary | ICD-10-CM | POA: Diagnosis not present

## 2023-11-07 DIAGNOSIS — F02A0 Dementia in other diseases classified elsewhere, mild, without behavioral disturbance, psychotic disturbance, mood disturbance, and anxiety: Secondary | ICD-10-CM | POA: Diagnosis not present

## 2023-11-07 DIAGNOSIS — G309 Alzheimer's disease, unspecified: Secondary | ICD-10-CM | POA: Diagnosis not present

## 2023-11-07 DIAGNOSIS — G4700 Insomnia, unspecified: Secondary | ICD-10-CM | POA: Diagnosis not present

## 2023-11-08 DIAGNOSIS — Z7984 Long term (current) use of oral hypoglycemic drugs: Secondary | ICD-10-CM | POA: Diagnosis not present

## 2023-11-08 DIAGNOSIS — E785 Hyperlipidemia, unspecified: Secondary | ICD-10-CM | POA: Diagnosis not present

## 2023-11-08 DIAGNOSIS — E039 Hypothyroidism, unspecified: Secondary | ICD-10-CM | POA: Diagnosis not present

## 2023-11-08 DIAGNOSIS — E1169 Type 2 diabetes mellitus with other specified complication: Secondary | ICD-10-CM | POA: Diagnosis not present

## 2023-11-12 DIAGNOSIS — K5730 Diverticulosis of large intestine without perforation or abscess without bleeding: Secondary | ICD-10-CM | POA: Diagnosis not present

## 2023-11-12 DIAGNOSIS — K5190 Ulcerative colitis, unspecified, without complications: Secondary | ICD-10-CM | POA: Diagnosis not present

## 2023-11-12 DIAGNOSIS — I10 Essential (primary) hypertension: Secondary | ICD-10-CM | POA: Diagnosis not present

## 2023-11-12 DIAGNOSIS — Z48815 Encounter for surgical aftercare following surgery on the digestive system: Secondary | ICD-10-CM | POA: Diagnosis not present

## 2023-11-12 DIAGNOSIS — Z433 Encounter for attention to colostomy: Secondary | ICD-10-CM | POA: Diagnosis not present

## 2023-11-12 DIAGNOSIS — F0390 Unspecified dementia without behavioral disturbance: Secondary | ICD-10-CM | POA: Diagnosis not present

## 2023-11-12 DIAGNOSIS — E079 Disorder of thyroid, unspecified: Secondary | ICD-10-CM | POA: Diagnosis not present

## 2023-11-12 DIAGNOSIS — E119 Type 2 diabetes mellitus without complications: Secondary | ICD-10-CM | POA: Diagnosis not present

## 2023-11-12 DIAGNOSIS — D649 Anemia, unspecified: Secondary | ICD-10-CM | POA: Diagnosis not present

## 2023-11-14 DIAGNOSIS — F411 Generalized anxiety disorder: Secondary | ICD-10-CM | POA: Diagnosis not present

## 2023-11-15 DIAGNOSIS — E1169 Type 2 diabetes mellitus with other specified complication: Secondary | ICD-10-CM | POA: Diagnosis not present

## 2023-11-18 DIAGNOSIS — D649 Anemia, unspecified: Secondary | ICD-10-CM | POA: Diagnosis not present

## 2023-11-18 DIAGNOSIS — Z48815 Encounter for surgical aftercare following surgery on the digestive system: Secondary | ICD-10-CM | POA: Diagnosis not present

## 2023-11-18 DIAGNOSIS — E079 Disorder of thyroid, unspecified: Secondary | ICD-10-CM | POA: Diagnosis not present

## 2023-11-18 DIAGNOSIS — E119 Type 2 diabetes mellitus without complications: Secondary | ICD-10-CM | POA: Diagnosis not present

## 2023-11-18 DIAGNOSIS — K5190 Ulcerative colitis, unspecified, without complications: Secondary | ICD-10-CM | POA: Diagnosis not present

## 2023-11-18 DIAGNOSIS — I10 Essential (primary) hypertension: Secondary | ICD-10-CM | POA: Diagnosis not present

## 2023-11-18 DIAGNOSIS — F0390 Unspecified dementia without behavioral disturbance: Secondary | ICD-10-CM | POA: Diagnosis not present

## 2023-11-18 DIAGNOSIS — K5730 Diverticulosis of large intestine without perforation or abscess without bleeding: Secondary | ICD-10-CM | POA: Diagnosis not present

## 2023-11-18 DIAGNOSIS — Z433 Encounter for attention to colostomy: Secondary | ICD-10-CM | POA: Diagnosis not present

## 2023-11-20 DIAGNOSIS — R109 Unspecified abdominal pain: Secondary | ICD-10-CM | POA: Diagnosis not present

## 2023-11-21 DIAGNOSIS — F411 Generalized anxiety disorder: Secondary | ICD-10-CM | POA: Diagnosis not present

## 2023-11-22 DIAGNOSIS — R300 Dysuria: Secondary | ICD-10-CM | POA: Diagnosis not present

## 2023-11-25 DIAGNOSIS — D62 Acute posthemorrhagic anemia: Secondary | ICD-10-CM | POA: Diagnosis not present

## 2023-11-25 DIAGNOSIS — E785 Hyperlipidemia, unspecified: Secondary | ICD-10-CM | POA: Diagnosis not present

## 2023-11-29 DIAGNOSIS — M5432 Sciatica, left side: Secondary | ICD-10-CM | POA: Diagnosis not present

## 2023-12-02 DIAGNOSIS — E079 Disorder of thyroid, unspecified: Secondary | ICD-10-CM | POA: Diagnosis not present

## 2023-12-02 DIAGNOSIS — D649 Anemia, unspecified: Secondary | ICD-10-CM | POA: Diagnosis not present

## 2023-12-02 DIAGNOSIS — Z48815 Encounter for surgical aftercare following surgery on the digestive system: Secondary | ICD-10-CM | POA: Diagnosis not present

## 2023-12-02 DIAGNOSIS — K5190 Ulcerative colitis, unspecified, without complications: Secondary | ICD-10-CM | POA: Diagnosis not present

## 2023-12-02 DIAGNOSIS — E119 Type 2 diabetes mellitus without complications: Secondary | ICD-10-CM | POA: Diagnosis not present

## 2023-12-02 DIAGNOSIS — K5730 Diverticulosis of large intestine without perforation or abscess without bleeding: Secondary | ICD-10-CM | POA: Diagnosis not present

## 2023-12-02 DIAGNOSIS — Z433 Encounter for attention to colostomy: Secondary | ICD-10-CM | POA: Diagnosis not present

## 2023-12-02 DIAGNOSIS — F0390 Unspecified dementia without behavioral disturbance: Secondary | ICD-10-CM | POA: Diagnosis not present

## 2023-12-02 DIAGNOSIS — I10 Essential (primary) hypertension: Secondary | ICD-10-CM | POA: Diagnosis not present

## 2023-12-03 DIAGNOSIS — R109 Unspecified abdominal pain: Secondary | ICD-10-CM | POA: Diagnosis not present

## 2023-12-04 DIAGNOSIS — R2689 Other abnormalities of gait and mobility: Secondary | ICD-10-CM | POA: Diagnosis not present

## 2023-12-04 DIAGNOSIS — M6281 Muscle weakness (generalized): Secondary | ICD-10-CM | POA: Diagnosis not present

## 2023-12-04 DIAGNOSIS — R2681 Unsteadiness on feet: Secondary | ICD-10-CM | POA: Diagnosis not present

## 2023-12-05 DIAGNOSIS — F411 Generalized anxiety disorder: Secondary | ICD-10-CM | POA: Diagnosis not present

## 2023-12-05 DIAGNOSIS — F02A0 Dementia in other diseases classified elsewhere, mild, without behavioral disturbance, psychotic disturbance, mood disturbance, and anxiety: Secondary | ICD-10-CM | POA: Diagnosis not present

## 2023-12-05 DIAGNOSIS — M6281 Muscle weakness (generalized): Secondary | ICD-10-CM | POA: Diagnosis not present

## 2023-12-05 DIAGNOSIS — G309 Alzheimer's disease, unspecified: Secondary | ICD-10-CM | POA: Diagnosis not present

## 2023-12-05 DIAGNOSIS — G4700 Insomnia, unspecified: Secondary | ICD-10-CM | POA: Diagnosis not present

## 2023-12-06 DIAGNOSIS — E538 Deficiency of other specified B group vitamins: Secondary | ICD-10-CM | POA: Diagnosis not present

## 2023-12-06 DIAGNOSIS — N390 Urinary tract infection, site not specified: Secondary | ICD-10-CM | POA: Diagnosis not present

## 2023-12-06 DIAGNOSIS — N182 Chronic kidney disease, stage 2 (mild): Secondary | ICD-10-CM | POA: Diagnosis not present

## 2023-12-06 DIAGNOSIS — I129 Hypertensive chronic kidney disease with stage 1 through stage 4 chronic kidney disease, or unspecified chronic kidney disease: Secondary | ICD-10-CM | POA: Diagnosis not present

## 2023-12-06 DIAGNOSIS — F02A0 Dementia in other diseases classified elsewhere, mild, without behavioral disturbance, psychotic disturbance, mood disturbance, and anxiety: Secondary | ICD-10-CM | POA: Diagnosis not present

## 2023-12-06 DIAGNOSIS — G309 Alzheimer's disease, unspecified: Secondary | ICD-10-CM | POA: Diagnosis not present

## 2023-12-07 DIAGNOSIS — F0390 Unspecified dementia without behavioral disturbance: Secondary | ICD-10-CM | POA: Diagnosis not present

## 2023-12-07 DIAGNOSIS — K5730 Diverticulosis of large intestine without perforation or abscess without bleeding: Secondary | ICD-10-CM | POA: Diagnosis not present

## 2023-12-07 DIAGNOSIS — E079 Disorder of thyroid, unspecified: Secondary | ICD-10-CM | POA: Diagnosis not present

## 2023-12-07 DIAGNOSIS — E119 Type 2 diabetes mellitus without complications: Secondary | ICD-10-CM | POA: Diagnosis not present

## 2023-12-07 DIAGNOSIS — Z433 Encounter for attention to colostomy: Secondary | ICD-10-CM | POA: Diagnosis not present

## 2023-12-07 DIAGNOSIS — D649 Anemia, unspecified: Secondary | ICD-10-CM | POA: Diagnosis not present

## 2023-12-07 DIAGNOSIS — K5190 Ulcerative colitis, unspecified, without complications: Secondary | ICD-10-CM | POA: Diagnosis not present

## 2023-12-07 DIAGNOSIS — I10 Essential (primary) hypertension: Secondary | ICD-10-CM | POA: Diagnosis not present

## 2023-12-07 DIAGNOSIS — Z48815 Encounter for surgical aftercare following surgery on the digestive system: Secondary | ICD-10-CM | POA: Diagnosis not present

## 2023-12-09 DIAGNOSIS — R2681 Unsteadiness on feet: Secondary | ICD-10-CM | POA: Diagnosis not present

## 2023-12-09 DIAGNOSIS — R2689 Other abnormalities of gait and mobility: Secondary | ICD-10-CM | POA: Diagnosis not present

## 2023-12-09 DIAGNOSIS — M6281 Muscle weakness (generalized): Secondary | ICD-10-CM | POA: Diagnosis not present

## 2023-12-10 DIAGNOSIS — R2681 Unsteadiness on feet: Secondary | ICD-10-CM | POA: Diagnosis not present

## 2023-12-10 DIAGNOSIS — M6281 Muscle weakness (generalized): Secondary | ICD-10-CM | POA: Diagnosis not present

## 2023-12-10 DIAGNOSIS — R2689 Other abnormalities of gait and mobility: Secondary | ICD-10-CM | POA: Diagnosis not present

## 2023-12-11 DIAGNOSIS — M6281 Muscle weakness (generalized): Secondary | ICD-10-CM | POA: Diagnosis not present

## 2023-12-11 DIAGNOSIS — R2681 Unsteadiness on feet: Secondary | ICD-10-CM | POA: Diagnosis not present

## 2023-12-11 DIAGNOSIS — R2689 Other abnormalities of gait and mobility: Secondary | ICD-10-CM | POA: Diagnosis not present

## 2023-12-12 ENCOUNTER — Ambulatory Visit: Payer: Medicare PPO | Admitting: Neurology

## 2023-12-12 DIAGNOSIS — F411 Generalized anxiety disorder: Secondary | ICD-10-CM | POA: Diagnosis not present

## 2023-12-12 DIAGNOSIS — Z933 Colostomy status: Secondary | ICD-10-CM | POA: Diagnosis not present

## 2023-12-12 DIAGNOSIS — Z433 Encounter for attention to colostomy: Secondary | ICD-10-CM | POA: Diagnosis not present

## 2023-12-13 DIAGNOSIS — R000 Tachycardia, unspecified: Secondary | ICD-10-CM | POA: Diagnosis not present

## 2023-12-13 DIAGNOSIS — N390 Urinary tract infection, site not specified: Secondary | ICD-10-CM | POA: Diagnosis not present

## 2023-12-13 DIAGNOSIS — R4182 Altered mental status, unspecified: Secondary | ICD-10-CM | POA: Diagnosis not present

## 2023-12-13 DIAGNOSIS — R2689 Other abnormalities of gait and mobility: Secondary | ICD-10-CM | POA: Diagnosis not present

## 2023-12-13 DIAGNOSIS — R2681 Unsteadiness on feet: Secondary | ICD-10-CM | POA: Diagnosis not present

## 2023-12-13 DIAGNOSIS — M6281 Muscle weakness (generalized): Secondary | ICD-10-CM | POA: Diagnosis not present

## 2023-12-14 DIAGNOSIS — Z79899 Other long term (current) drug therapy: Secondary | ICD-10-CM | POA: Diagnosis not present

## 2023-12-16 DIAGNOSIS — M6281 Muscle weakness (generalized): Secondary | ICD-10-CM | POA: Diagnosis not present

## 2023-12-17 DIAGNOSIS — R2681 Unsteadiness on feet: Secondary | ICD-10-CM | POA: Diagnosis not present

## 2023-12-17 DIAGNOSIS — M6281 Muscle weakness (generalized): Secondary | ICD-10-CM | POA: Diagnosis not present

## 2023-12-17 DIAGNOSIS — R2689 Other abnormalities of gait and mobility: Secondary | ICD-10-CM | POA: Diagnosis not present

## 2023-12-18 DIAGNOSIS — M6281 Muscle weakness (generalized): Secondary | ICD-10-CM | POA: Diagnosis not present

## 2023-12-19 DIAGNOSIS — R2689 Other abnormalities of gait and mobility: Secondary | ICD-10-CM | POA: Diagnosis not present

## 2023-12-19 DIAGNOSIS — Z433 Encounter for attention to colostomy: Secondary | ICD-10-CM | POA: Diagnosis not present

## 2023-12-19 DIAGNOSIS — I10 Essential (primary) hypertension: Secondary | ICD-10-CM | POA: Diagnosis not present

## 2023-12-19 DIAGNOSIS — F411 Generalized anxiety disorder: Secondary | ICD-10-CM | POA: Diagnosis not present

## 2023-12-19 DIAGNOSIS — K5730 Diverticulosis of large intestine without perforation or abscess without bleeding: Secondary | ICD-10-CM | POA: Diagnosis not present

## 2023-12-19 DIAGNOSIS — D649 Anemia, unspecified: Secondary | ICD-10-CM | POA: Diagnosis not present

## 2023-12-19 DIAGNOSIS — F0390 Unspecified dementia without behavioral disturbance: Secondary | ICD-10-CM | POA: Diagnosis not present

## 2023-12-19 DIAGNOSIS — E7800 Pure hypercholesterolemia, unspecified: Secondary | ICD-10-CM | POA: Diagnosis not present

## 2023-12-19 DIAGNOSIS — E079 Disorder of thyroid, unspecified: Secondary | ICD-10-CM | POA: Diagnosis not present

## 2023-12-19 DIAGNOSIS — E119 Type 2 diabetes mellitus without complications: Secondary | ICD-10-CM | POA: Diagnosis not present

## 2023-12-19 DIAGNOSIS — R2681 Unsteadiness on feet: Secondary | ICD-10-CM | POA: Diagnosis not present

## 2023-12-19 DIAGNOSIS — M6281 Muscle weakness (generalized): Secondary | ICD-10-CM | POA: Diagnosis not present

## 2023-12-19 DIAGNOSIS — K5190 Ulcerative colitis, unspecified, without complications: Secondary | ICD-10-CM | POA: Diagnosis not present

## 2023-12-20 DIAGNOSIS — M6281 Muscle weakness (generalized): Secondary | ICD-10-CM | POA: Diagnosis not present

## 2023-12-20 DIAGNOSIS — R2681 Unsteadiness on feet: Secondary | ICD-10-CM | POA: Diagnosis not present

## 2023-12-20 DIAGNOSIS — R2689 Other abnormalities of gait and mobility: Secondary | ICD-10-CM | POA: Diagnosis not present

## 2023-12-24 DIAGNOSIS — R2681 Unsteadiness on feet: Secondary | ICD-10-CM | POA: Diagnosis not present

## 2023-12-24 DIAGNOSIS — D519 Vitamin B12 deficiency anemia, unspecified: Secondary | ICD-10-CM | POA: Diagnosis not present

## 2023-12-24 DIAGNOSIS — K5190 Ulcerative colitis, unspecified, without complications: Secondary | ICD-10-CM | POA: Diagnosis not present

## 2023-12-24 DIAGNOSIS — R2689 Other abnormalities of gait and mobility: Secondary | ICD-10-CM | POA: Diagnosis not present

## 2023-12-24 DIAGNOSIS — M6281 Muscle weakness (generalized): Secondary | ICD-10-CM | POA: Diagnosis not present

## 2023-12-25 DIAGNOSIS — I10 Essential (primary) hypertension: Secondary | ICD-10-CM | POA: Diagnosis not present

## 2023-12-25 DIAGNOSIS — Z933 Colostomy status: Secondary | ICD-10-CM | POA: Diagnosis not present

## 2023-12-25 DIAGNOSIS — G4700 Insomnia, unspecified: Secondary | ICD-10-CM | POA: Diagnosis not present

## 2023-12-25 DIAGNOSIS — G309 Alzheimer's disease, unspecified: Secondary | ICD-10-CM | POA: Diagnosis not present

## 2023-12-25 DIAGNOSIS — E785 Hyperlipidemia, unspecified: Secondary | ICD-10-CM | POA: Diagnosis not present

## 2023-12-25 DIAGNOSIS — R2681 Unsteadiness on feet: Secondary | ICD-10-CM | POA: Diagnosis not present

## 2023-12-25 DIAGNOSIS — D519 Vitamin B12 deficiency anemia, unspecified: Secondary | ICD-10-CM | POA: Diagnosis not present

## 2023-12-25 DIAGNOSIS — E039 Hypothyroidism, unspecified: Secondary | ICD-10-CM | POA: Diagnosis not present

## 2023-12-25 DIAGNOSIS — E118 Type 2 diabetes mellitus with unspecified complications: Secondary | ICD-10-CM | POA: Diagnosis not present

## 2023-12-25 DIAGNOSIS — M6281 Muscle weakness (generalized): Secondary | ICD-10-CM | POA: Diagnosis not present

## 2023-12-25 DIAGNOSIS — R2689 Other abnormalities of gait and mobility: Secondary | ICD-10-CM | POA: Diagnosis not present

## 2023-12-25 DIAGNOSIS — K5190 Ulcerative colitis, unspecified, without complications: Secondary | ICD-10-CM | POA: Diagnosis not present

## 2023-12-26 DIAGNOSIS — F411 Generalized anxiety disorder: Secondary | ICD-10-CM | POA: Diagnosis not present

## 2023-12-26 DIAGNOSIS — E079 Disorder of thyroid, unspecified: Secondary | ICD-10-CM | POA: Diagnosis not present

## 2023-12-26 DIAGNOSIS — D649 Anemia, unspecified: Secondary | ICD-10-CM | POA: Diagnosis not present

## 2023-12-26 DIAGNOSIS — F0390 Unspecified dementia without behavioral disturbance: Secondary | ICD-10-CM | POA: Diagnosis not present

## 2023-12-26 DIAGNOSIS — E7800 Pure hypercholesterolemia, unspecified: Secondary | ICD-10-CM | POA: Diagnosis not present

## 2023-12-26 DIAGNOSIS — K5730 Diverticulosis of large intestine without perforation or abscess without bleeding: Secondary | ICD-10-CM | POA: Diagnosis not present

## 2023-12-26 DIAGNOSIS — Z433 Encounter for attention to colostomy: Secondary | ICD-10-CM | POA: Diagnosis not present

## 2023-12-26 DIAGNOSIS — E119 Type 2 diabetes mellitus without complications: Secondary | ICD-10-CM | POA: Diagnosis not present

## 2023-12-26 DIAGNOSIS — K5190 Ulcerative colitis, unspecified, without complications: Secondary | ICD-10-CM | POA: Diagnosis not present

## 2023-12-26 DIAGNOSIS — I10 Essential (primary) hypertension: Secondary | ICD-10-CM | POA: Diagnosis not present

## 2023-12-27 DIAGNOSIS — M6281 Muscle weakness (generalized): Secondary | ICD-10-CM | POA: Diagnosis not present

## 2023-12-27 DIAGNOSIS — R2689 Other abnormalities of gait and mobility: Secondary | ICD-10-CM | POA: Diagnosis not present

## 2023-12-27 DIAGNOSIS — R2681 Unsteadiness on feet: Secondary | ICD-10-CM | POA: Diagnosis not present

## 2023-12-28 DIAGNOSIS — R2681 Unsteadiness on feet: Secondary | ICD-10-CM | POA: Diagnosis not present

## 2023-12-28 DIAGNOSIS — R2689 Other abnormalities of gait and mobility: Secondary | ICD-10-CM | POA: Diagnosis not present

## 2023-12-28 DIAGNOSIS — M6281 Muscle weakness (generalized): Secondary | ICD-10-CM | POA: Diagnosis not present

## 2023-12-30 DIAGNOSIS — M6281 Muscle weakness (generalized): Secondary | ICD-10-CM | POA: Diagnosis not present

## 2023-12-31 DIAGNOSIS — M6281 Muscle weakness (generalized): Secondary | ICD-10-CM | POA: Diagnosis not present

## 2024-01-01 DIAGNOSIS — M6281 Muscle weakness (generalized): Secondary | ICD-10-CM | POA: Diagnosis not present

## 2024-01-02 DIAGNOSIS — G4700 Insomnia, unspecified: Secondary | ICD-10-CM | POA: Diagnosis not present

## 2024-01-02 DIAGNOSIS — F02A0 Dementia in other diseases classified elsewhere, mild, without behavioral disturbance, psychotic disturbance, mood disturbance, and anxiety: Secondary | ICD-10-CM | POA: Diagnosis not present

## 2024-01-02 DIAGNOSIS — F411 Generalized anxiety disorder: Secondary | ICD-10-CM | POA: Diagnosis not present

## 2024-01-02 DIAGNOSIS — M6281 Muscle weakness (generalized): Secondary | ICD-10-CM | POA: Diagnosis not present

## 2024-01-02 DIAGNOSIS — G309 Alzheimer's disease, unspecified: Secondary | ICD-10-CM | POA: Diagnosis not present

## 2024-01-02 DIAGNOSIS — I10 Essential (primary) hypertension: Secondary | ICD-10-CM | POA: Diagnosis not present

## 2024-01-02 DIAGNOSIS — N182 Chronic kidney disease, stage 2 (mild): Secondary | ICD-10-CM | POA: Diagnosis not present

## 2024-01-03 DIAGNOSIS — Z433 Encounter for attention to colostomy: Secondary | ICD-10-CM | POA: Diagnosis not present

## 2024-01-03 DIAGNOSIS — F0390 Unspecified dementia without behavioral disturbance: Secondary | ICD-10-CM | POA: Diagnosis not present

## 2024-01-03 DIAGNOSIS — Z933 Colostomy status: Secondary | ICD-10-CM | POA: Diagnosis not present

## 2024-01-03 DIAGNOSIS — K5730 Diverticulosis of large intestine without perforation or abscess without bleeding: Secondary | ICD-10-CM | POA: Diagnosis not present

## 2024-01-03 DIAGNOSIS — E039 Hypothyroidism, unspecified: Secondary | ICD-10-CM | POA: Diagnosis not present

## 2024-01-03 DIAGNOSIS — M545 Low back pain, unspecified: Secondary | ICD-10-CM | POA: Diagnosis not present

## 2024-01-03 DIAGNOSIS — D649 Anemia, unspecified: Secondary | ICD-10-CM | POA: Diagnosis not present

## 2024-01-03 DIAGNOSIS — K51911 Ulcerative colitis, unspecified with rectal bleeding: Secondary | ICD-10-CM | POA: Diagnosis not present

## 2024-01-03 DIAGNOSIS — E7800 Pure hypercholesterolemia, unspecified: Secondary | ICD-10-CM | POA: Diagnosis not present

## 2024-01-03 DIAGNOSIS — I10 Essential (primary) hypertension: Secondary | ICD-10-CM | POA: Diagnosis not present

## 2024-01-03 DIAGNOSIS — E079 Disorder of thyroid, unspecified: Secondary | ICD-10-CM | POA: Diagnosis not present

## 2024-01-03 DIAGNOSIS — M25552 Pain in left hip: Secondary | ICD-10-CM | POA: Diagnosis not present

## 2024-01-03 DIAGNOSIS — K5190 Ulcerative colitis, unspecified, without complications: Secondary | ICD-10-CM | POA: Diagnosis not present

## 2024-01-03 DIAGNOSIS — E119 Type 2 diabetes mellitus without complications: Secondary | ICD-10-CM | POA: Diagnosis not present

## 2024-01-06 DIAGNOSIS — M6281 Muscle weakness (generalized): Secondary | ICD-10-CM | POA: Diagnosis not present

## 2024-01-07 DIAGNOSIS — M6281 Muscle weakness (generalized): Secondary | ICD-10-CM | POA: Diagnosis not present

## 2024-01-08 DIAGNOSIS — E7800 Pure hypercholesterolemia, unspecified: Secondary | ICD-10-CM | POA: Diagnosis not present

## 2024-01-08 DIAGNOSIS — K5730 Diverticulosis of large intestine without perforation or abscess without bleeding: Secondary | ICD-10-CM | POA: Diagnosis not present

## 2024-01-08 DIAGNOSIS — E079 Disorder of thyroid, unspecified: Secondary | ICD-10-CM | POA: Diagnosis not present

## 2024-01-08 DIAGNOSIS — D649 Anemia, unspecified: Secondary | ICD-10-CM | POA: Diagnosis not present

## 2024-01-08 DIAGNOSIS — K5190 Ulcerative colitis, unspecified, without complications: Secondary | ICD-10-CM | POA: Diagnosis not present

## 2024-01-08 DIAGNOSIS — E119 Type 2 diabetes mellitus without complications: Secondary | ICD-10-CM | POA: Diagnosis not present

## 2024-01-08 DIAGNOSIS — I10 Essential (primary) hypertension: Secondary | ICD-10-CM | POA: Diagnosis not present

## 2024-01-08 DIAGNOSIS — F0390 Unspecified dementia without behavioral disturbance: Secondary | ICD-10-CM | POA: Diagnosis not present

## 2024-01-08 DIAGNOSIS — Z433 Encounter for attention to colostomy: Secondary | ICD-10-CM | POA: Diagnosis not present

## 2024-01-09 DIAGNOSIS — F411 Generalized anxiety disorder: Secondary | ICD-10-CM | POA: Diagnosis not present

## 2024-01-13 DIAGNOSIS — M6281 Muscle weakness (generalized): Secondary | ICD-10-CM | POA: Diagnosis not present

## 2024-01-15 DIAGNOSIS — M6281 Muscle weakness (generalized): Secondary | ICD-10-CM | POA: Diagnosis not present

## 2024-01-16 DIAGNOSIS — Z433 Encounter for attention to colostomy: Secondary | ICD-10-CM | POA: Diagnosis not present

## 2024-01-16 DIAGNOSIS — E119 Type 2 diabetes mellitus without complications: Secondary | ICD-10-CM | POA: Diagnosis not present

## 2024-01-16 DIAGNOSIS — K5190 Ulcerative colitis, unspecified, without complications: Secondary | ICD-10-CM | POA: Diagnosis not present

## 2024-01-16 DIAGNOSIS — F0390 Unspecified dementia without behavioral disturbance: Secondary | ICD-10-CM | POA: Diagnosis not present

## 2024-01-16 DIAGNOSIS — F411 Generalized anxiety disorder: Secondary | ICD-10-CM | POA: Diagnosis not present

## 2024-01-16 DIAGNOSIS — K5730 Diverticulosis of large intestine without perforation or abscess without bleeding: Secondary | ICD-10-CM | POA: Diagnosis not present

## 2024-01-16 DIAGNOSIS — D649 Anemia, unspecified: Secondary | ICD-10-CM | POA: Diagnosis not present

## 2024-01-16 DIAGNOSIS — E079 Disorder of thyroid, unspecified: Secondary | ICD-10-CM | POA: Diagnosis not present

## 2024-01-16 DIAGNOSIS — E7800 Pure hypercholesterolemia, unspecified: Secondary | ICD-10-CM | POA: Diagnosis not present

## 2024-01-16 DIAGNOSIS — I10 Essential (primary) hypertension: Secondary | ICD-10-CM | POA: Diagnosis not present

## 2024-01-16 DIAGNOSIS — M6281 Muscle weakness (generalized): Secondary | ICD-10-CM | POA: Diagnosis not present

## 2024-01-21 DIAGNOSIS — M6281 Muscle weakness (generalized): Secondary | ICD-10-CM | POA: Diagnosis not present

## 2024-01-23 DIAGNOSIS — E079 Disorder of thyroid, unspecified: Secondary | ICD-10-CM | POA: Diagnosis not present

## 2024-01-23 DIAGNOSIS — F0390 Unspecified dementia without behavioral disturbance: Secondary | ICD-10-CM | POA: Diagnosis not present

## 2024-01-23 DIAGNOSIS — K5190 Ulcerative colitis, unspecified, without complications: Secondary | ICD-10-CM | POA: Diagnosis not present

## 2024-01-23 DIAGNOSIS — E7800 Pure hypercholesterolemia, unspecified: Secondary | ICD-10-CM | POA: Diagnosis not present

## 2024-01-23 DIAGNOSIS — D649 Anemia, unspecified: Secondary | ICD-10-CM | POA: Diagnosis not present

## 2024-01-23 DIAGNOSIS — I10 Essential (primary) hypertension: Secondary | ICD-10-CM | POA: Diagnosis not present

## 2024-01-23 DIAGNOSIS — F411 Generalized anxiety disorder: Secondary | ICD-10-CM | POA: Diagnosis not present

## 2024-01-23 DIAGNOSIS — E119 Type 2 diabetes mellitus without complications: Secondary | ICD-10-CM | POA: Diagnosis not present

## 2024-01-23 DIAGNOSIS — Z433 Encounter for attention to colostomy: Secondary | ICD-10-CM | POA: Diagnosis not present

## 2024-01-23 DIAGNOSIS — K5730 Diverticulosis of large intestine without perforation or abscess without bleeding: Secondary | ICD-10-CM | POA: Diagnosis not present

## 2024-01-27 DIAGNOSIS — M6281 Muscle weakness (generalized): Secondary | ICD-10-CM | POA: Diagnosis not present

## 2024-01-28 DIAGNOSIS — M6281 Muscle weakness (generalized): Secondary | ICD-10-CM | POA: Diagnosis not present

## 2024-01-29 DIAGNOSIS — R2689 Other abnormalities of gait and mobility: Secondary | ICD-10-CM | POA: Diagnosis not present

## 2024-01-29 DIAGNOSIS — R2681 Unsteadiness on feet: Secondary | ICD-10-CM | POA: Diagnosis not present

## 2024-01-29 DIAGNOSIS — M6281 Muscle weakness (generalized): Secondary | ICD-10-CM | POA: Diagnosis not present

## 2024-01-29 DIAGNOSIS — E119 Type 2 diabetes mellitus without complications: Secondary | ICD-10-CM | POA: Diagnosis not present

## 2024-01-30 DIAGNOSIS — E119 Type 2 diabetes mellitus without complications: Secondary | ICD-10-CM | POA: Diagnosis not present

## 2024-01-30 DIAGNOSIS — F411 Generalized anxiety disorder: Secondary | ICD-10-CM | POA: Diagnosis not present

## 2024-01-30 DIAGNOSIS — M6281 Muscle weakness (generalized): Secondary | ICD-10-CM | POA: Diagnosis not present

## 2024-01-30 DIAGNOSIS — R2689 Other abnormalities of gait and mobility: Secondary | ICD-10-CM | POA: Diagnosis not present

## 2024-01-30 DIAGNOSIS — G309 Alzheimer's disease, unspecified: Secondary | ICD-10-CM | POA: Diagnosis not present

## 2024-01-30 DIAGNOSIS — R2681 Unsteadiness on feet: Secondary | ICD-10-CM | POA: Diagnosis not present

## 2024-01-30 DIAGNOSIS — G4700 Insomnia, unspecified: Secondary | ICD-10-CM | POA: Diagnosis not present

## 2024-02-03 DIAGNOSIS — R2689 Other abnormalities of gait and mobility: Secondary | ICD-10-CM | POA: Diagnosis not present

## 2024-02-03 DIAGNOSIS — M6281 Muscle weakness (generalized): Secondary | ICD-10-CM | POA: Diagnosis not present

## 2024-02-03 DIAGNOSIS — R2681 Unsteadiness on feet: Secondary | ICD-10-CM | POA: Diagnosis not present

## 2024-02-05 DIAGNOSIS — M6281 Muscle weakness (generalized): Secondary | ICD-10-CM | POA: Diagnosis not present

## 2024-02-05 DIAGNOSIS — R2689 Other abnormalities of gait and mobility: Secondary | ICD-10-CM | POA: Diagnosis not present

## 2024-02-05 DIAGNOSIS — R2681 Unsteadiness on feet: Secondary | ICD-10-CM | POA: Diagnosis not present

## 2024-02-06 DIAGNOSIS — E119 Type 2 diabetes mellitus without complications: Secondary | ICD-10-CM | POA: Diagnosis not present

## 2024-02-06 DIAGNOSIS — F0390 Unspecified dementia without behavioral disturbance: Secondary | ICD-10-CM | POA: Diagnosis not present

## 2024-02-06 DIAGNOSIS — E079 Disorder of thyroid, unspecified: Secondary | ICD-10-CM | POA: Diagnosis not present

## 2024-02-06 DIAGNOSIS — M6281 Muscle weakness (generalized): Secondary | ICD-10-CM | POA: Diagnosis not present

## 2024-02-06 DIAGNOSIS — I10 Essential (primary) hypertension: Secondary | ICD-10-CM | POA: Diagnosis not present

## 2024-02-06 DIAGNOSIS — K5730 Diverticulosis of large intestine without perforation or abscess without bleeding: Secondary | ICD-10-CM | POA: Diagnosis not present

## 2024-02-06 DIAGNOSIS — K5190 Ulcerative colitis, unspecified, without complications: Secondary | ICD-10-CM | POA: Diagnosis not present

## 2024-02-06 DIAGNOSIS — R2681 Unsteadiness on feet: Secondary | ICD-10-CM | POA: Diagnosis not present

## 2024-02-06 DIAGNOSIS — D649 Anemia, unspecified: Secondary | ICD-10-CM | POA: Diagnosis not present

## 2024-02-06 DIAGNOSIS — R2689 Other abnormalities of gait and mobility: Secondary | ICD-10-CM | POA: Diagnosis not present

## 2024-02-06 DIAGNOSIS — E7800 Pure hypercholesterolemia, unspecified: Secondary | ICD-10-CM | POA: Diagnosis not present

## 2024-02-06 DIAGNOSIS — Z433 Encounter for attention to colostomy: Secondary | ICD-10-CM | POA: Diagnosis not present

## 2024-02-07 DIAGNOSIS — E538 Deficiency of other specified B group vitamins: Secondary | ICD-10-CM | POA: Diagnosis not present

## 2024-02-07 DIAGNOSIS — G309 Alzheimer's disease, unspecified: Secondary | ICD-10-CM | POA: Diagnosis not present

## 2024-02-07 DIAGNOSIS — I129 Hypertensive chronic kidney disease with stage 1 through stage 4 chronic kidney disease, or unspecified chronic kidney disease: Secondary | ICD-10-CM | POA: Diagnosis not present

## 2024-02-07 DIAGNOSIS — N182 Chronic kidney disease, stage 2 (mild): Secondary | ICD-10-CM | POA: Diagnosis not present

## 2024-02-10 DIAGNOSIS — M6281 Muscle weakness (generalized): Secondary | ICD-10-CM | POA: Diagnosis not present

## 2024-02-10 DIAGNOSIS — E7800 Pure hypercholesterolemia, unspecified: Secondary | ICD-10-CM | POA: Diagnosis not present

## 2024-02-10 DIAGNOSIS — R2689 Other abnormalities of gait and mobility: Secondary | ICD-10-CM | POA: Diagnosis not present

## 2024-02-10 DIAGNOSIS — E119 Type 2 diabetes mellitus without complications: Secondary | ICD-10-CM | POA: Diagnosis not present

## 2024-02-10 DIAGNOSIS — R2681 Unsteadiness on feet: Secondary | ICD-10-CM | POA: Diagnosis not present

## 2024-02-10 DIAGNOSIS — E079 Disorder of thyroid, unspecified: Secondary | ICD-10-CM | POA: Diagnosis not present

## 2024-02-10 DIAGNOSIS — F0390 Unspecified dementia without behavioral disturbance: Secondary | ICD-10-CM | POA: Diagnosis not present

## 2024-02-10 DIAGNOSIS — K5730 Diverticulosis of large intestine without perforation or abscess without bleeding: Secondary | ICD-10-CM | POA: Diagnosis not present

## 2024-02-10 DIAGNOSIS — I10 Essential (primary) hypertension: Secondary | ICD-10-CM | POA: Diagnosis not present

## 2024-02-10 DIAGNOSIS — D649 Anemia, unspecified: Secondary | ICD-10-CM | POA: Diagnosis not present

## 2024-02-10 DIAGNOSIS — Z433 Encounter for attention to colostomy: Secondary | ICD-10-CM | POA: Diagnosis not present

## 2024-02-10 DIAGNOSIS — K5190 Ulcerative colitis, unspecified, without complications: Secondary | ICD-10-CM | POA: Diagnosis not present

## 2024-02-11 DIAGNOSIS — Z933 Colostomy status: Secondary | ICD-10-CM | POA: Diagnosis not present

## 2024-02-11 DIAGNOSIS — Z433 Encounter for attention to colostomy: Secondary | ICD-10-CM | POA: Diagnosis not present

## 2024-02-12 DIAGNOSIS — R2681 Unsteadiness on feet: Secondary | ICD-10-CM | POA: Diagnosis not present

## 2024-02-12 DIAGNOSIS — M6281 Muscle weakness (generalized): Secondary | ICD-10-CM | POA: Diagnosis not present

## 2024-02-12 DIAGNOSIS — R2689 Other abnormalities of gait and mobility: Secondary | ICD-10-CM | POA: Diagnosis not present

## 2024-02-13 DIAGNOSIS — R2681 Unsteadiness on feet: Secondary | ICD-10-CM | POA: Diagnosis not present

## 2024-02-13 DIAGNOSIS — R2689 Other abnormalities of gait and mobility: Secondary | ICD-10-CM | POA: Diagnosis not present

## 2024-02-13 DIAGNOSIS — M6281 Muscle weakness (generalized): Secondary | ICD-10-CM | POA: Diagnosis not present

## 2024-02-17 DIAGNOSIS — R2689 Other abnormalities of gait and mobility: Secondary | ICD-10-CM | POA: Diagnosis not present

## 2024-02-17 DIAGNOSIS — R2681 Unsteadiness on feet: Secondary | ICD-10-CM | POA: Diagnosis not present

## 2024-02-17 DIAGNOSIS — F411 Generalized anxiety disorder: Secondary | ICD-10-CM | POA: Diagnosis not present

## 2024-02-17 DIAGNOSIS — M6281 Muscle weakness (generalized): Secondary | ICD-10-CM | POA: Diagnosis not present

## 2024-02-19 DIAGNOSIS — R2681 Unsteadiness on feet: Secondary | ICD-10-CM | POA: Diagnosis not present

## 2024-02-19 DIAGNOSIS — M6281 Muscle weakness (generalized): Secondary | ICD-10-CM | POA: Diagnosis not present

## 2024-02-19 DIAGNOSIS — R2689 Other abnormalities of gait and mobility: Secondary | ICD-10-CM | POA: Diagnosis not present

## 2024-02-20 DIAGNOSIS — I10 Essential (primary) hypertension: Secondary | ICD-10-CM | POA: Diagnosis not present

## 2024-02-20 DIAGNOSIS — M6281 Muscle weakness (generalized): Secondary | ICD-10-CM | POA: Diagnosis not present

## 2024-02-20 DIAGNOSIS — E079 Disorder of thyroid, unspecified: Secondary | ICD-10-CM | POA: Diagnosis not present

## 2024-02-20 DIAGNOSIS — D649 Anemia, unspecified: Secondary | ICD-10-CM | POA: Diagnosis not present

## 2024-02-20 DIAGNOSIS — K5190 Ulcerative colitis, unspecified, without complications: Secondary | ICD-10-CM | POA: Diagnosis not present

## 2024-02-20 DIAGNOSIS — Z433 Encounter for attention to colostomy: Secondary | ICD-10-CM | POA: Diagnosis not present

## 2024-02-20 DIAGNOSIS — R2681 Unsteadiness on feet: Secondary | ICD-10-CM | POA: Diagnosis not present

## 2024-02-20 DIAGNOSIS — E119 Type 2 diabetes mellitus without complications: Secondary | ICD-10-CM | POA: Diagnosis not present

## 2024-02-20 DIAGNOSIS — E7800 Pure hypercholesterolemia, unspecified: Secondary | ICD-10-CM | POA: Diagnosis not present

## 2024-02-20 DIAGNOSIS — F0390 Unspecified dementia without behavioral disturbance: Secondary | ICD-10-CM | POA: Diagnosis not present

## 2024-02-20 DIAGNOSIS — R2689 Other abnormalities of gait and mobility: Secondary | ICD-10-CM | POA: Diagnosis not present

## 2024-02-20 DIAGNOSIS — K5730 Diverticulosis of large intestine without perforation or abscess without bleeding: Secondary | ICD-10-CM | POA: Diagnosis not present

## 2024-02-24 DIAGNOSIS — F411 Generalized anxiety disorder: Secondary | ICD-10-CM | POA: Diagnosis not present

## 2024-02-24 DIAGNOSIS — M6281 Muscle weakness (generalized): Secondary | ICD-10-CM | POA: Diagnosis not present

## 2024-02-25 DIAGNOSIS — M6281 Muscle weakness (generalized): Secondary | ICD-10-CM | POA: Diagnosis not present

## 2024-02-25 DIAGNOSIS — G309 Alzheimer's disease, unspecified: Secondary | ICD-10-CM | POA: Diagnosis not present

## 2024-02-25 DIAGNOSIS — R2689 Other abnormalities of gait and mobility: Secondary | ICD-10-CM | POA: Diagnosis not present

## 2024-02-25 DIAGNOSIS — R2681 Unsteadiness on feet: Secondary | ICD-10-CM | POA: Diagnosis not present

## 2024-02-25 DIAGNOSIS — F411 Generalized anxiety disorder: Secondary | ICD-10-CM | POA: Diagnosis not present

## 2024-02-26 DIAGNOSIS — F5105 Insomnia due to other mental disorder: Secondary | ICD-10-CM | POA: Diagnosis not present

## 2024-02-26 DIAGNOSIS — G301 Alzheimer's disease with late onset: Secondary | ICD-10-CM | POA: Diagnosis not present

## 2024-02-26 DIAGNOSIS — F411 Generalized anxiety disorder: Secondary | ICD-10-CM | POA: Diagnosis not present

## 2024-02-27 DIAGNOSIS — K51919 Ulcerative colitis, unspecified with unspecified complications: Secondary | ICD-10-CM | POA: Diagnosis not present

## 2024-02-27 DIAGNOSIS — R2681 Unsteadiness on feet: Secondary | ICD-10-CM | POA: Diagnosis not present

## 2024-02-27 DIAGNOSIS — Z9049 Acquired absence of other specified parts of digestive tract: Secondary | ICD-10-CM | POA: Diagnosis not present

## 2024-02-27 DIAGNOSIS — Z8719 Personal history of other diseases of the digestive system: Secondary | ICD-10-CM | POA: Diagnosis not present

## 2024-02-27 DIAGNOSIS — M6281 Muscle weakness (generalized): Secondary | ICD-10-CM | POA: Diagnosis not present

## 2024-02-27 DIAGNOSIS — R2689 Other abnormalities of gait and mobility: Secondary | ICD-10-CM | POA: Diagnosis not present

## 2024-03-02 DIAGNOSIS — F411 Generalized anxiety disorder: Secondary | ICD-10-CM | POA: Diagnosis not present

## 2024-03-03 DIAGNOSIS — R2681 Unsteadiness on feet: Secondary | ICD-10-CM | POA: Diagnosis not present

## 2024-03-03 DIAGNOSIS — R2689 Other abnormalities of gait and mobility: Secondary | ICD-10-CM | POA: Diagnosis not present

## 2024-03-03 DIAGNOSIS — M6281 Muscle weakness (generalized): Secondary | ICD-10-CM | POA: Diagnosis not present

## 2024-03-04 DIAGNOSIS — R2689 Other abnormalities of gait and mobility: Secondary | ICD-10-CM | POA: Diagnosis not present

## 2024-03-04 DIAGNOSIS — R2681 Unsteadiness on feet: Secondary | ICD-10-CM | POA: Diagnosis not present

## 2024-03-04 DIAGNOSIS — M6281 Muscle weakness (generalized): Secondary | ICD-10-CM | POA: Diagnosis not present

## 2024-03-06 DIAGNOSIS — R2681 Unsteadiness on feet: Secondary | ICD-10-CM | POA: Diagnosis not present

## 2024-03-06 DIAGNOSIS — Z933 Colostomy status: Secondary | ICD-10-CM | POA: Diagnosis not present

## 2024-03-06 DIAGNOSIS — R2689 Other abnormalities of gait and mobility: Secondary | ICD-10-CM | POA: Diagnosis not present

## 2024-03-06 DIAGNOSIS — M6281 Muscle weakness (generalized): Secondary | ICD-10-CM | POA: Diagnosis not present

## 2024-03-06 DIAGNOSIS — H00015 Hordeolum externum left lower eyelid: Secondary | ICD-10-CM | POA: Diagnosis not present

## 2024-03-09 DIAGNOSIS — M6281 Muscle weakness (generalized): Secondary | ICD-10-CM | POA: Diagnosis not present

## 2024-03-09 DIAGNOSIS — Z933 Colostomy status: Secondary | ICD-10-CM | POA: Diagnosis not present

## 2024-03-09 DIAGNOSIS — Z433 Encounter for attention to colostomy: Secondary | ICD-10-CM | POA: Diagnosis not present

## 2024-03-10 DIAGNOSIS — R2681 Unsteadiness on feet: Secondary | ICD-10-CM | POA: Diagnosis not present

## 2024-03-10 DIAGNOSIS — M6281 Muscle weakness (generalized): Secondary | ICD-10-CM | POA: Diagnosis not present

## 2024-03-10 DIAGNOSIS — R2689 Other abnormalities of gait and mobility: Secondary | ICD-10-CM | POA: Diagnosis not present

## 2024-03-11 DIAGNOSIS — R2689 Other abnormalities of gait and mobility: Secondary | ICD-10-CM | POA: Diagnosis not present

## 2024-03-11 DIAGNOSIS — M6281 Muscle weakness (generalized): Secondary | ICD-10-CM | POA: Diagnosis not present

## 2024-03-11 DIAGNOSIS — R2681 Unsteadiness on feet: Secondary | ICD-10-CM | POA: Diagnosis not present

## 2024-03-13 DIAGNOSIS — E039 Hypothyroidism, unspecified: Secondary | ICD-10-CM | POA: Diagnosis not present

## 2024-03-13 DIAGNOSIS — R2681 Unsteadiness on feet: Secondary | ICD-10-CM | POA: Diagnosis not present

## 2024-03-13 DIAGNOSIS — K51911 Ulcerative colitis, unspecified with rectal bleeding: Secondary | ICD-10-CM | POA: Diagnosis not present

## 2024-03-13 DIAGNOSIS — R2689 Other abnormalities of gait and mobility: Secondary | ICD-10-CM | POA: Diagnosis not present

## 2024-03-13 DIAGNOSIS — M6281 Muscle weakness (generalized): Secondary | ICD-10-CM | POA: Diagnosis not present

## 2024-03-13 DIAGNOSIS — Z933 Colostomy status: Secondary | ICD-10-CM | POA: Diagnosis not present

## 2024-03-13 DIAGNOSIS — M25552 Pain in left hip: Secondary | ICD-10-CM | POA: Diagnosis not present

## 2024-03-14 DIAGNOSIS — K589 Irritable bowel syndrome without diarrhea: Secondary | ICD-10-CM | POA: Diagnosis not present

## 2024-03-14 DIAGNOSIS — K9403 Colostomy malfunction: Secondary | ICD-10-CM | POA: Diagnosis not present

## 2024-03-14 DIAGNOSIS — I10 Essential (primary) hypertension: Secondary | ICD-10-CM | POA: Diagnosis not present

## 2024-03-14 DIAGNOSIS — F0284 Dementia in other diseases classified elsewhere, unspecified severity, with anxiety: Secondary | ICD-10-CM | POA: Diagnosis not present

## 2024-03-14 DIAGNOSIS — F411 Generalized anxiety disorder: Secondary | ICD-10-CM | POA: Diagnosis not present

## 2024-03-14 DIAGNOSIS — G309 Alzheimer's disease, unspecified: Secondary | ICD-10-CM | POA: Diagnosis not present

## 2024-03-14 DIAGNOSIS — N390 Urinary tract infection, site not specified: Secondary | ICD-10-CM | POA: Diagnosis not present

## 2024-03-14 DIAGNOSIS — E119 Type 2 diabetes mellitus without complications: Secondary | ICD-10-CM | POA: Diagnosis not present

## 2024-03-14 DIAGNOSIS — F02818 Dementia in other diseases classified elsewhere, unspecified severity, with other behavioral disturbance: Secondary | ICD-10-CM | POA: Diagnosis not present

## 2024-03-16 DIAGNOSIS — F411 Generalized anxiety disorder: Secondary | ICD-10-CM | POA: Diagnosis not present

## 2024-03-16 DIAGNOSIS — R2689 Other abnormalities of gait and mobility: Secondary | ICD-10-CM | POA: Diagnosis not present

## 2024-03-16 DIAGNOSIS — M6281 Muscle weakness (generalized): Secondary | ICD-10-CM | POA: Diagnosis not present

## 2024-03-16 DIAGNOSIS — R2681 Unsteadiness on feet: Secondary | ICD-10-CM | POA: Diagnosis not present

## 2024-03-17 DIAGNOSIS — E039 Hypothyroidism, unspecified: Secondary | ICD-10-CM | POA: Diagnosis not present

## 2024-03-17 DIAGNOSIS — K9403 Colostomy malfunction: Secondary | ICD-10-CM | POA: Diagnosis not present

## 2024-03-17 DIAGNOSIS — I10 Essential (primary) hypertension: Secondary | ICD-10-CM | POA: Diagnosis not present

## 2024-03-17 DIAGNOSIS — M6281 Muscle weakness (generalized): Secondary | ICD-10-CM | POA: Diagnosis not present

## 2024-03-17 DIAGNOSIS — F02818 Dementia in other diseases classified elsewhere, unspecified severity, with other behavioral disturbance: Secondary | ICD-10-CM | POA: Diagnosis not present

## 2024-03-17 DIAGNOSIS — N390 Urinary tract infection, site not specified: Secondary | ICD-10-CM | POA: Diagnosis not present

## 2024-03-17 DIAGNOSIS — K589 Irritable bowel syndrome without diarrhea: Secondary | ICD-10-CM | POA: Diagnosis not present

## 2024-03-17 DIAGNOSIS — F411 Generalized anxiety disorder: Secondary | ICD-10-CM | POA: Diagnosis not present

## 2024-03-17 DIAGNOSIS — F0284 Dementia in other diseases classified elsewhere, unspecified severity, with anxiety: Secondary | ICD-10-CM | POA: Diagnosis not present

## 2024-03-17 DIAGNOSIS — E119 Type 2 diabetes mellitus without complications: Secondary | ICD-10-CM | POA: Diagnosis not present

## 2024-03-17 DIAGNOSIS — G309 Alzheimer's disease, unspecified: Secondary | ICD-10-CM | POA: Diagnosis not present

## 2024-03-19 DIAGNOSIS — M6281 Muscle weakness (generalized): Secondary | ICD-10-CM | POA: Diagnosis not present

## 2024-03-20 DIAGNOSIS — F411 Generalized anxiety disorder: Secondary | ICD-10-CM | POA: Diagnosis not present

## 2024-03-20 DIAGNOSIS — M6281 Muscle weakness (generalized): Secondary | ICD-10-CM | POA: Diagnosis not present

## 2024-03-20 DIAGNOSIS — F02818 Dementia in other diseases classified elsewhere, unspecified severity, with other behavioral disturbance: Secondary | ICD-10-CM | POA: Diagnosis not present

## 2024-03-20 DIAGNOSIS — K9403 Colostomy malfunction: Secondary | ICD-10-CM | POA: Diagnosis not present

## 2024-03-20 DIAGNOSIS — E119 Type 2 diabetes mellitus without complications: Secondary | ICD-10-CM | POA: Diagnosis not present

## 2024-03-20 DIAGNOSIS — F0284 Dementia in other diseases classified elsewhere, unspecified severity, with anxiety: Secondary | ICD-10-CM | POA: Diagnosis not present

## 2024-03-20 DIAGNOSIS — R2689 Other abnormalities of gait and mobility: Secondary | ICD-10-CM | POA: Diagnosis not present

## 2024-03-20 DIAGNOSIS — N390 Urinary tract infection, site not specified: Secondary | ICD-10-CM | POA: Diagnosis not present

## 2024-03-20 DIAGNOSIS — R2681 Unsteadiness on feet: Secondary | ICD-10-CM | POA: Diagnosis not present

## 2024-03-20 DIAGNOSIS — G309 Alzheimer's disease, unspecified: Secondary | ICD-10-CM | POA: Diagnosis not present

## 2024-03-20 DIAGNOSIS — I10 Essential (primary) hypertension: Secondary | ICD-10-CM | POA: Diagnosis not present

## 2024-03-20 DIAGNOSIS — K589 Irritable bowel syndrome without diarrhea: Secondary | ICD-10-CM | POA: Diagnosis not present

## 2024-03-23 DIAGNOSIS — M6281 Muscle weakness (generalized): Secondary | ICD-10-CM | POA: Diagnosis not present

## 2024-03-23 DIAGNOSIS — R2681 Unsteadiness on feet: Secondary | ICD-10-CM | POA: Diagnosis not present

## 2024-03-23 DIAGNOSIS — F411 Generalized anxiety disorder: Secondary | ICD-10-CM | POA: Diagnosis not present

## 2024-03-23 DIAGNOSIS — R2689 Other abnormalities of gait and mobility: Secondary | ICD-10-CM | POA: Diagnosis not present

## 2024-03-24 DIAGNOSIS — M6281 Muscle weakness (generalized): Secondary | ICD-10-CM | POA: Diagnosis not present

## 2024-03-25 DIAGNOSIS — F02818 Dementia in other diseases classified elsewhere, unspecified severity, with other behavioral disturbance: Secondary | ICD-10-CM | POA: Diagnosis not present

## 2024-03-25 DIAGNOSIS — G309 Alzheimer's disease, unspecified: Secondary | ICD-10-CM | POA: Diagnosis not present

## 2024-03-25 DIAGNOSIS — E119 Type 2 diabetes mellitus without complications: Secondary | ICD-10-CM | POA: Diagnosis not present

## 2024-03-25 DIAGNOSIS — F411 Generalized anxiety disorder: Secondary | ICD-10-CM | POA: Diagnosis not present

## 2024-03-25 DIAGNOSIS — K9403 Colostomy malfunction: Secondary | ICD-10-CM | POA: Diagnosis not present

## 2024-03-25 DIAGNOSIS — I10 Essential (primary) hypertension: Secondary | ICD-10-CM | POA: Diagnosis not present

## 2024-03-25 DIAGNOSIS — F0284 Dementia in other diseases classified elsewhere, unspecified severity, with anxiety: Secondary | ICD-10-CM | POA: Diagnosis not present

## 2024-03-25 DIAGNOSIS — E785 Hyperlipidemia, unspecified: Secondary | ICD-10-CM | POA: Diagnosis not present

## 2024-03-25 DIAGNOSIS — K589 Irritable bowel syndrome without diarrhea: Secondary | ICD-10-CM | POA: Diagnosis not present

## 2024-03-25 DIAGNOSIS — N390 Urinary tract infection, site not specified: Secondary | ICD-10-CM | POA: Diagnosis not present

## 2024-03-25 DIAGNOSIS — R2681 Unsteadiness on feet: Secondary | ICD-10-CM | POA: Diagnosis not present

## 2024-03-25 DIAGNOSIS — M6281 Muscle weakness (generalized): Secondary | ICD-10-CM | POA: Diagnosis not present

## 2024-03-25 DIAGNOSIS — R2689 Other abnormalities of gait and mobility: Secondary | ICD-10-CM | POA: Diagnosis not present

## 2024-03-26 DIAGNOSIS — M6281 Muscle weakness (generalized): Secondary | ICD-10-CM | POA: Diagnosis not present

## 2024-03-26 DIAGNOSIS — F02A0 Dementia in other diseases classified elsewhere, mild, without behavioral disturbance, psychotic disturbance, mood disturbance, and anxiety: Secondary | ICD-10-CM | POA: Diagnosis not present

## 2024-03-26 DIAGNOSIS — F411 Generalized anxiety disorder: Secondary | ICD-10-CM | POA: Diagnosis not present

## 2024-03-26 DIAGNOSIS — G301 Alzheimer's disease with late onset: Secondary | ICD-10-CM | POA: Diagnosis not present

## 2024-03-26 DIAGNOSIS — F5105 Insomnia due to other mental disorder: Secondary | ICD-10-CM | POA: Diagnosis not present

## 2024-03-27 DIAGNOSIS — R2681 Unsteadiness on feet: Secondary | ICD-10-CM | POA: Diagnosis not present

## 2024-03-27 DIAGNOSIS — M6281 Muscle weakness (generalized): Secondary | ICD-10-CM | POA: Diagnosis not present

## 2024-03-27 DIAGNOSIS — R2689 Other abnormalities of gait and mobility: Secondary | ICD-10-CM | POA: Diagnosis not present

## 2024-03-31 DIAGNOSIS — R2689 Other abnormalities of gait and mobility: Secondary | ICD-10-CM | POA: Diagnosis not present

## 2024-03-31 DIAGNOSIS — R2681 Unsteadiness on feet: Secondary | ICD-10-CM | POA: Diagnosis not present

## 2024-03-31 DIAGNOSIS — M6281 Muscle weakness (generalized): Secondary | ICD-10-CM | POA: Diagnosis not present

## 2024-04-01 DIAGNOSIS — I10 Essential (primary) hypertension: Secondary | ICD-10-CM | POA: Diagnosis not present

## 2024-04-01 DIAGNOSIS — F0284 Dementia in other diseases classified elsewhere, unspecified severity, with anxiety: Secondary | ICD-10-CM | POA: Diagnosis not present

## 2024-04-01 DIAGNOSIS — K589 Irritable bowel syndrome without diarrhea: Secondary | ICD-10-CM | POA: Diagnosis not present

## 2024-04-01 DIAGNOSIS — M6281 Muscle weakness (generalized): Secondary | ICD-10-CM | POA: Diagnosis not present

## 2024-04-01 DIAGNOSIS — N390 Urinary tract infection, site not specified: Secondary | ICD-10-CM | POA: Diagnosis not present

## 2024-04-01 DIAGNOSIS — K9403 Colostomy malfunction: Secondary | ICD-10-CM | POA: Diagnosis not present

## 2024-04-01 DIAGNOSIS — F411 Generalized anxiety disorder: Secondary | ICD-10-CM | POA: Diagnosis not present

## 2024-04-01 DIAGNOSIS — F02818 Dementia in other diseases classified elsewhere, unspecified severity, with other behavioral disturbance: Secondary | ICD-10-CM | POA: Diagnosis not present

## 2024-04-01 DIAGNOSIS — E119 Type 2 diabetes mellitus without complications: Secondary | ICD-10-CM | POA: Diagnosis not present

## 2024-04-01 DIAGNOSIS — G309 Alzheimer's disease, unspecified: Secondary | ICD-10-CM | POA: Diagnosis not present

## 2024-04-02 DIAGNOSIS — M6281 Muscle weakness (generalized): Secondary | ICD-10-CM | POA: Diagnosis not present

## 2024-04-02 DIAGNOSIS — R2689 Other abnormalities of gait and mobility: Secondary | ICD-10-CM | POA: Diagnosis not present

## 2024-04-02 DIAGNOSIS — R2681 Unsteadiness on feet: Secondary | ICD-10-CM | POA: Diagnosis not present

## 2024-04-03 DIAGNOSIS — R2689 Other abnormalities of gait and mobility: Secondary | ICD-10-CM | POA: Diagnosis not present

## 2024-04-03 DIAGNOSIS — R2681 Unsteadiness on feet: Secondary | ICD-10-CM | POA: Diagnosis not present

## 2024-04-03 DIAGNOSIS — M6281 Muscle weakness (generalized): Secondary | ICD-10-CM | POA: Diagnosis not present

## 2024-04-03 DIAGNOSIS — E039 Hypothyroidism, unspecified: Secondary | ICD-10-CM | POA: Diagnosis not present

## 2024-04-03 DIAGNOSIS — E785 Hyperlipidemia, unspecified: Secondary | ICD-10-CM | POA: Diagnosis not present

## 2024-04-03 DIAGNOSIS — G4700 Insomnia, unspecified: Secondary | ICD-10-CM | POA: Diagnosis not present

## 2024-04-05 DIAGNOSIS — G309 Alzheimer's disease, unspecified: Secondary | ICD-10-CM | POA: Diagnosis not present

## 2024-04-05 DIAGNOSIS — K589 Irritable bowel syndrome without diarrhea: Secondary | ICD-10-CM | POA: Diagnosis not present

## 2024-04-05 DIAGNOSIS — K9403 Colostomy malfunction: Secondary | ICD-10-CM | POA: Diagnosis not present

## 2024-04-08 DIAGNOSIS — F0284 Dementia in other diseases classified elsewhere, unspecified severity, with anxiety: Secondary | ICD-10-CM | POA: Diagnosis not present

## 2024-04-08 DIAGNOSIS — F02818 Dementia in other diseases classified elsewhere, unspecified severity, with other behavioral disturbance: Secondary | ICD-10-CM | POA: Diagnosis not present

## 2024-04-08 DIAGNOSIS — F411 Generalized anxiety disorder: Secondary | ICD-10-CM | POA: Diagnosis not present

## 2024-04-08 DIAGNOSIS — I10 Essential (primary) hypertension: Secondary | ICD-10-CM | POA: Diagnosis not present

## 2024-04-08 DIAGNOSIS — N390 Urinary tract infection, site not specified: Secondary | ICD-10-CM | POA: Diagnosis not present

## 2024-04-08 DIAGNOSIS — E119 Type 2 diabetes mellitus without complications: Secondary | ICD-10-CM | POA: Diagnosis not present

## 2024-04-08 DIAGNOSIS — R41841 Cognitive communication deficit: Secondary | ICD-10-CM | POA: Diagnosis not present

## 2024-04-08 DIAGNOSIS — K589 Irritable bowel syndrome without diarrhea: Secondary | ICD-10-CM | POA: Diagnosis not present

## 2024-04-08 DIAGNOSIS — G309 Alzheimer's disease, unspecified: Secondary | ICD-10-CM | POA: Diagnosis not present

## 2024-04-08 DIAGNOSIS — K9403 Colostomy malfunction: Secondary | ICD-10-CM | POA: Diagnosis not present

## 2024-04-13 DIAGNOSIS — M6281 Muscle weakness (generalized): Secondary | ICD-10-CM | POA: Diagnosis not present

## 2024-04-13 DIAGNOSIS — F411 Generalized anxiety disorder: Secondary | ICD-10-CM | POA: Diagnosis not present

## 2024-04-14 DIAGNOSIS — R41841 Cognitive communication deficit: Secondary | ICD-10-CM | POA: Diagnosis not present

## 2024-04-14 DIAGNOSIS — M6281 Muscle weakness (generalized): Secondary | ICD-10-CM | POA: Diagnosis not present

## 2024-04-16 DIAGNOSIS — M6281 Muscle weakness (generalized): Secondary | ICD-10-CM | POA: Diagnosis not present

## 2024-04-16 DIAGNOSIS — R41841 Cognitive communication deficit: Secondary | ICD-10-CM | POA: Diagnosis not present

## 2024-04-17 DIAGNOSIS — R41841 Cognitive communication deficit: Secondary | ICD-10-CM | POA: Diagnosis not present

## 2024-04-20 DIAGNOSIS — R2681 Unsteadiness on feet: Secondary | ICD-10-CM | POA: Diagnosis not present

## 2024-04-20 DIAGNOSIS — R2689 Other abnormalities of gait and mobility: Secondary | ICD-10-CM | POA: Diagnosis not present

## 2024-04-20 DIAGNOSIS — M6281 Muscle weakness (generalized): Secondary | ICD-10-CM | POA: Diagnosis not present

## 2024-04-20 DIAGNOSIS — R41841 Cognitive communication deficit: Secondary | ICD-10-CM | POA: Diagnosis not present

## 2024-04-20 DIAGNOSIS — F411 Generalized anxiety disorder: Secondary | ICD-10-CM | POA: Diagnosis not present

## 2024-04-21 DIAGNOSIS — M6281 Muscle weakness (generalized): Secondary | ICD-10-CM | POA: Diagnosis not present

## 2024-04-21 DIAGNOSIS — G309 Alzheimer's disease, unspecified: Secondary | ICD-10-CM | POA: Diagnosis not present

## 2024-04-21 DIAGNOSIS — R41841 Cognitive communication deficit: Secondary | ICD-10-CM | POA: Diagnosis not present

## 2024-04-21 DIAGNOSIS — E785 Hyperlipidemia, unspecified: Secondary | ICD-10-CM | POA: Diagnosis not present

## 2024-04-22 DIAGNOSIS — R2681 Unsteadiness on feet: Secondary | ICD-10-CM | POA: Diagnosis not present

## 2024-04-22 DIAGNOSIS — R2689 Other abnormalities of gait and mobility: Secondary | ICD-10-CM | POA: Diagnosis not present

## 2024-04-22 DIAGNOSIS — R41841 Cognitive communication deficit: Secondary | ICD-10-CM | POA: Diagnosis not present

## 2024-04-22 DIAGNOSIS — M6281 Muscle weakness (generalized): Secondary | ICD-10-CM | POA: Diagnosis not present

## 2024-04-23 DIAGNOSIS — G4700 Insomnia, unspecified: Secondary | ICD-10-CM | POA: Diagnosis not present

## 2024-04-23 DIAGNOSIS — G309 Alzheimer's disease, unspecified: Secondary | ICD-10-CM | POA: Diagnosis not present

## 2024-04-23 DIAGNOSIS — R41841 Cognitive communication deficit: Secondary | ICD-10-CM | POA: Diagnosis not present

## 2024-04-23 DIAGNOSIS — M6281 Muscle weakness (generalized): Secondary | ICD-10-CM | POA: Diagnosis not present

## 2024-04-23 DIAGNOSIS — F02A0 Dementia in other diseases classified elsewhere, mild, without behavioral disturbance, psychotic disturbance, mood disturbance, and anxiety: Secondary | ICD-10-CM | POA: Diagnosis not present

## 2024-04-23 DIAGNOSIS — F411 Generalized anxiety disorder: Secondary | ICD-10-CM | POA: Diagnosis not present

## 2024-04-24 DIAGNOSIS — R2681 Unsteadiness on feet: Secondary | ICD-10-CM | POA: Diagnosis not present

## 2024-04-24 DIAGNOSIS — R41841 Cognitive communication deficit: Secondary | ICD-10-CM | POA: Diagnosis not present

## 2024-04-24 DIAGNOSIS — M6281 Muscle weakness (generalized): Secondary | ICD-10-CM | POA: Diagnosis not present

## 2024-04-24 DIAGNOSIS — R2689 Other abnormalities of gait and mobility: Secondary | ICD-10-CM | POA: Diagnosis not present

## 2024-04-27 DIAGNOSIS — F411 Generalized anxiety disorder: Secondary | ICD-10-CM | POA: Diagnosis not present

## 2024-04-28 DIAGNOSIS — R2689 Other abnormalities of gait and mobility: Secondary | ICD-10-CM | POA: Diagnosis not present

## 2024-04-28 DIAGNOSIS — M6281 Muscle weakness (generalized): Secondary | ICD-10-CM | POA: Diagnosis not present

## 2024-04-28 DIAGNOSIS — R2681 Unsteadiness on feet: Secondary | ICD-10-CM | POA: Diagnosis not present

## 2024-04-29 DIAGNOSIS — R2681 Unsteadiness on feet: Secondary | ICD-10-CM | POA: Diagnosis not present

## 2024-04-29 DIAGNOSIS — R2689 Other abnormalities of gait and mobility: Secondary | ICD-10-CM | POA: Diagnosis not present

## 2024-04-29 DIAGNOSIS — M6281 Muscle weakness (generalized): Secondary | ICD-10-CM | POA: Diagnosis not present

## 2024-04-30 DIAGNOSIS — M6281 Muscle weakness (generalized): Secondary | ICD-10-CM | POA: Diagnosis not present

## 2024-05-01 DIAGNOSIS — E538 Deficiency of other specified B group vitamins: Secondary | ICD-10-CM | POA: Diagnosis not present

## 2024-05-01 DIAGNOSIS — G309 Alzheimer's disease, unspecified: Secondary | ICD-10-CM | POA: Diagnosis not present

## 2024-05-01 DIAGNOSIS — N182 Chronic kidney disease, stage 2 (mild): Secondary | ICD-10-CM | POA: Diagnosis not present

## 2024-05-01 DIAGNOSIS — I129 Hypertensive chronic kidney disease with stage 1 through stage 4 chronic kidney disease, or unspecified chronic kidney disease: Secondary | ICD-10-CM | POA: Diagnosis not present

## 2024-05-04 DIAGNOSIS — F411 Generalized anxiety disorder: Secondary | ICD-10-CM | POA: Diagnosis not present

## 2024-05-08 DIAGNOSIS — E039 Hypothyroidism, unspecified: Secondary | ICD-10-CM | POA: Diagnosis not present

## 2024-05-08 DIAGNOSIS — E118 Type 2 diabetes mellitus with unspecified complications: Secondary | ICD-10-CM | POA: Diagnosis not present

## 2024-05-08 DIAGNOSIS — E785 Hyperlipidemia, unspecified: Secondary | ICD-10-CM | POA: Diagnosis not present

## 2024-05-14 DIAGNOSIS — Z933 Colostomy status: Secondary | ICD-10-CM | POA: Diagnosis not present

## 2024-05-14 DIAGNOSIS — Z433 Encounter for attention to colostomy: Secondary | ICD-10-CM | POA: Diagnosis not present

## 2024-05-18 DIAGNOSIS — F411 Generalized anxiety disorder: Secondary | ICD-10-CM | POA: Diagnosis not present

## 2024-05-21 DIAGNOSIS — G4700 Insomnia, unspecified: Secondary | ICD-10-CM | POA: Diagnosis not present

## 2024-05-21 DIAGNOSIS — F411 Generalized anxiety disorder: Secondary | ICD-10-CM | POA: Diagnosis not present

## 2024-05-21 DIAGNOSIS — G309 Alzheimer's disease, unspecified: Secondary | ICD-10-CM | POA: Diagnosis not present

## 2024-05-21 DIAGNOSIS — F02A0 Dementia in other diseases classified elsewhere, mild, without behavioral disturbance, psychotic disturbance, mood disturbance, and anxiety: Secondary | ICD-10-CM | POA: Diagnosis not present

## 2024-05-28 DIAGNOSIS — I10 Essential (primary) hypertension: Secondary | ICD-10-CM | POA: Diagnosis not present

## 2024-05-28 DIAGNOSIS — G309 Alzheimer's disease, unspecified: Secondary | ICD-10-CM | POA: Diagnosis not present

## 2024-05-29 DIAGNOSIS — Z933 Colostomy status: Secondary | ICD-10-CM | POA: Diagnosis not present

## 2024-05-29 DIAGNOSIS — M25552 Pain in left hip: Secondary | ICD-10-CM | POA: Diagnosis not present

## 2024-05-29 DIAGNOSIS — E039 Hypothyroidism, unspecified: Secondary | ICD-10-CM | POA: Diagnosis not present

## 2024-05-29 DIAGNOSIS — K51911 Ulcerative colitis, unspecified with rectal bleeding: Secondary | ICD-10-CM | POA: Diagnosis not present

## 2024-06-01 DIAGNOSIS — F411 Generalized anxiety disorder: Secondary | ICD-10-CM | POA: Diagnosis not present

## 2024-06-10 DIAGNOSIS — Z933 Colostomy status: Secondary | ICD-10-CM | POA: Diagnosis not present

## 2024-06-16 DIAGNOSIS — F411 Generalized anxiety disorder: Secondary | ICD-10-CM | POA: Diagnosis not present

## 2024-06-16 DIAGNOSIS — G4700 Insomnia, unspecified: Secondary | ICD-10-CM | POA: Diagnosis not present

## 2024-06-16 DIAGNOSIS — F02A0 Dementia in other diseases classified elsewhere, mild, without behavioral disturbance, psychotic disturbance, mood disturbance, and anxiety: Secondary | ICD-10-CM | POA: Diagnosis not present

## 2024-06-16 DIAGNOSIS — G309 Alzheimer's disease, unspecified: Secondary | ICD-10-CM | POA: Diagnosis not present

## 2024-06-19 DIAGNOSIS — K51911 Ulcerative colitis, unspecified with rectal bleeding: Secondary | ICD-10-CM | POA: Diagnosis not present

## 2024-06-19 DIAGNOSIS — Z933 Colostomy status: Secondary | ICD-10-CM | POA: Diagnosis not present

## 2024-06-19 DIAGNOSIS — M25552 Pain in left hip: Secondary | ICD-10-CM | POA: Diagnosis not present

## 2024-06-19 DIAGNOSIS — E039 Hypothyroidism, unspecified: Secondary | ICD-10-CM | POA: Diagnosis not present

## 2024-07-06 DIAGNOSIS — F411 Generalized anxiety disorder: Secondary | ICD-10-CM | POA: Diagnosis not present

## 2024-07-07 DIAGNOSIS — Z933 Colostomy status: Secondary | ICD-10-CM | POA: Diagnosis not present

## 2024-07-07 DIAGNOSIS — Z433 Encounter for attention to colostomy: Secondary | ICD-10-CM | POA: Diagnosis not present
